# Patient Record
Sex: Female | Born: 1985
Health system: Southern US, Community
[De-identification: ages and names within clinical notes are randomized; demographics above are authoritative.]

## PROBLEM LIST (undated history)

## (undated) ENCOUNTER — Emergency Department (HOSPITAL_COMMUNITY): Payer: Medicaid Other

## (undated) DIAGNOSIS — F329 Major depressive disorder, single episode, unspecified: Secondary | ICD-10-CM

## (undated) DIAGNOSIS — E079 Disorder of thyroid, unspecified: Secondary | ICD-10-CM

## (undated) DIAGNOSIS — R0600 Dyspnea, unspecified: Secondary | ICD-10-CM

## (undated) DIAGNOSIS — G8929 Other chronic pain: Secondary | ICD-10-CM

## (undated) DIAGNOSIS — Z5189 Encounter for other specified aftercare: Secondary | ICD-10-CM

## (undated) DIAGNOSIS — D649 Anemia, unspecified: Secondary | ICD-10-CM

## (undated) DIAGNOSIS — G5601 Carpal tunnel syndrome, right upper limb: Secondary | ICD-10-CM

## (undated) DIAGNOSIS — R51 Headache: Secondary | ICD-10-CM

## (undated) DIAGNOSIS — J189 Pneumonia, unspecified organism: Secondary | ICD-10-CM

## (undated) DIAGNOSIS — E669 Obesity, unspecified: Secondary | ICD-10-CM

## (undated) DIAGNOSIS — M1990 Unspecified osteoarthritis, unspecified site: Secondary | ICD-10-CM

## (undated) DIAGNOSIS — M549 Dorsalgia, unspecified: Secondary | ICD-10-CM

## (undated) DIAGNOSIS — F32A Depression, unspecified: Secondary | ICD-10-CM

## (undated) DIAGNOSIS — F419 Anxiety disorder, unspecified: Secondary | ICD-10-CM

## (undated) DIAGNOSIS — R011 Cardiac murmur, unspecified: Secondary | ICD-10-CM

## (undated) DIAGNOSIS — IMO0001 Reserved for inherently not codable concepts without codable children: Secondary | ICD-10-CM

## (undated) HISTORY — PX: ENDOMETRIAL ABLATION: SHX621

## (undated) HISTORY — PX: BACK SURGERY: SHX140

## (undated) HISTORY — PX: THYROIDECTOMY: SHX17

## (undated) HISTORY — PX: HAND SURGERY: SHX662

## (undated) HISTORY — PX: LAPAROSCOPY: SHX197

## (undated) HISTORY — PX: TUBAL LIGATION: SHX77

## (undated) HISTORY — PX: CHOLECYSTECTOMY: SHX55

## (undated) HISTORY — PX: APPENDECTOMY: SHX54

## (undated) HISTORY — PX: TONSILLECTOMY: SUR1361

---
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
## 2004-01-20 ENCOUNTER — Emergency Department (HOSPITAL_COMMUNITY): Admission: EM | Admit: 2004-01-20 | Discharge: 2004-01-20 | Payer: Self-pay | Admitting: Emergency Medicine

## 2004-03-30 ENCOUNTER — Emergency Department (HOSPITAL_COMMUNITY): Admission: EM | Admit: 2004-03-30 | Discharge: 2004-03-30 | Payer: Self-pay | Admitting: Emergency Medicine

## 2004-04-05 ENCOUNTER — Ambulatory Visit: Payer: Self-pay | Admitting: Orthopedic Surgery

## 2004-04-12 ENCOUNTER — Encounter (HOSPITAL_COMMUNITY): Admission: RE | Admit: 2004-04-12 | Discharge: 2004-05-12 | Payer: Self-pay | Admitting: Orthopedic Surgery

## 2004-05-09 ENCOUNTER — Ambulatory Visit: Payer: Self-pay | Admitting: Family Medicine

## 2004-05-25 ENCOUNTER — Ambulatory Visit (HOSPITAL_COMMUNITY): Admission: RE | Admit: 2004-05-25 | Discharge: 2004-05-25 | Payer: Self-pay | Admitting: Family Medicine

## 2004-06-28 ENCOUNTER — Emergency Department (HOSPITAL_COMMUNITY): Admission: EM | Admit: 2004-06-28 | Discharge: 2004-06-28 | Payer: Self-pay | Admitting: Emergency Medicine

## 2004-12-15 ENCOUNTER — Emergency Department (HOSPITAL_COMMUNITY): Admission: EM | Admit: 2004-12-15 | Discharge: 2004-12-16 | Payer: Self-pay | Admitting: *Deleted

## 2005-01-24 ENCOUNTER — Emergency Department (HOSPITAL_COMMUNITY): Admission: EM | Admit: 2005-01-24 | Discharge: 2005-01-24 | Payer: Self-pay | Admitting: Emergency Medicine

## 2005-03-06 ENCOUNTER — Emergency Department (HOSPITAL_COMMUNITY): Admission: EM | Admit: 2005-03-06 | Discharge: 2005-03-06 | Payer: Self-pay | Admitting: Emergency Medicine

## 2005-03-31 ENCOUNTER — Emergency Department (HOSPITAL_COMMUNITY): Admission: EM | Admit: 2005-03-31 | Discharge: 2005-03-31 | Payer: Self-pay | Admitting: Emergency Medicine

## 2005-07-02 ENCOUNTER — Emergency Department (HOSPITAL_COMMUNITY): Admission: EM | Admit: 2005-07-02 | Discharge: 2005-07-02 | Payer: Self-pay | Admitting: Family Medicine

## 2005-10-13 ENCOUNTER — Emergency Department (HOSPITAL_COMMUNITY): Admission: EM | Admit: 2005-10-13 | Discharge: 2005-10-13 | Payer: Self-pay | Admitting: Emergency Medicine

## 2005-11-17 ENCOUNTER — Emergency Department (HOSPITAL_COMMUNITY): Admission: EM | Admit: 2005-11-17 | Discharge: 2005-11-17 | Payer: Self-pay | Admitting: Emergency Medicine

## 2005-11-18 ENCOUNTER — Emergency Department (HOSPITAL_COMMUNITY): Admission: EM | Admit: 2005-11-18 | Discharge: 2005-11-18 | Payer: Self-pay | Admitting: Emergency Medicine

## 2006-02-03 ENCOUNTER — Inpatient Hospital Stay (HOSPITAL_COMMUNITY): Admission: AD | Admit: 2006-02-03 | Discharge: 2006-02-03 | Payer: Self-pay | Admitting: Family Medicine

## 2006-02-10 ENCOUNTER — Emergency Department (HOSPITAL_COMMUNITY): Admission: EM | Admit: 2006-02-10 | Discharge: 2006-02-10 | Payer: Self-pay | Admitting: Emergency Medicine

## 2006-06-02 ENCOUNTER — Emergency Department (HOSPITAL_COMMUNITY): Admission: EM | Admit: 2006-06-02 | Discharge: 2006-06-02 | Payer: Self-pay | Admitting: Emergency Medicine

## 2006-07-15 ENCOUNTER — Emergency Department (HOSPITAL_COMMUNITY): Admission: EM | Admit: 2006-07-15 | Discharge: 2006-07-15 | Payer: Self-pay | Admitting: Family Medicine

## 2006-07-27 ENCOUNTER — Emergency Department (HOSPITAL_COMMUNITY): Admission: EM | Admit: 2006-07-27 | Discharge: 2006-07-27 | Payer: Self-pay | Admitting: Emergency Medicine

## 2006-08-02 ENCOUNTER — Emergency Department (HOSPITAL_COMMUNITY): Admission: EM | Admit: 2006-08-02 | Discharge: 2006-08-02 | Payer: Self-pay | Admitting: Emergency Medicine

## 2006-08-18 ENCOUNTER — Inpatient Hospital Stay (HOSPITAL_COMMUNITY): Admission: AD | Admit: 2006-08-18 | Discharge: 2006-08-18 | Payer: Self-pay | Admitting: Family Medicine

## 2006-09-02 ENCOUNTER — Ambulatory Visit (HOSPITAL_COMMUNITY): Admission: RE | Admit: 2006-09-02 | Discharge: 2006-09-02 | Payer: Self-pay | Admitting: Family Medicine

## 2006-09-08 ENCOUNTER — Inpatient Hospital Stay (HOSPITAL_COMMUNITY): Admission: AD | Admit: 2006-09-08 | Discharge: 2006-09-09 | Payer: Self-pay | Admitting: Obstetrics and Gynecology

## 2006-09-24 ENCOUNTER — Ambulatory Visit (HOSPITAL_COMMUNITY): Admission: RE | Admit: 2006-09-24 | Discharge: 2006-09-24 | Payer: Self-pay | Admitting: Family Medicine

## 2006-10-07 ENCOUNTER — Ambulatory Visit (HOSPITAL_COMMUNITY): Admission: RE | Admit: 2006-10-07 | Discharge: 2006-10-07 | Payer: Self-pay | Admitting: Family Medicine

## 2006-10-22 ENCOUNTER — Ambulatory Visit (HOSPITAL_COMMUNITY): Admission: RE | Admit: 2006-10-22 | Discharge: 2006-10-22 | Payer: Self-pay | Admitting: Obstetrics and Gynecology

## 2006-10-31 ENCOUNTER — Emergency Department (HOSPITAL_COMMUNITY): Admission: EM | Admit: 2006-10-31 | Discharge: 2006-10-31 | Payer: Self-pay | Admitting: Emergency Medicine

## 2006-11-30 ENCOUNTER — Inpatient Hospital Stay (HOSPITAL_COMMUNITY): Admission: AD | Admit: 2006-11-30 | Discharge: 2006-12-01 | Payer: Self-pay | Admitting: Obstetrics & Gynecology

## 2006-12-06 ENCOUNTER — Inpatient Hospital Stay (HOSPITAL_COMMUNITY): Admission: AD | Admit: 2006-12-06 | Discharge: 2006-12-06 | Payer: Self-pay | Admitting: Obstetrics & Gynecology

## 2007-01-12 ENCOUNTER — Ambulatory Visit: Payer: Self-pay | Admitting: *Deleted

## 2007-01-12 ENCOUNTER — Inpatient Hospital Stay (HOSPITAL_COMMUNITY): Admission: AD | Admit: 2007-01-12 | Discharge: 2007-01-12 | Payer: Self-pay | Admitting: Obstetrics & Gynecology

## 2007-01-17 ENCOUNTER — Ambulatory Visit (HOSPITAL_COMMUNITY): Admission: RE | Admit: 2007-01-17 | Discharge: 2007-01-17 | Payer: Self-pay | Admitting: Obstetrics & Gynecology

## 2007-01-27 ENCOUNTER — Inpatient Hospital Stay (HOSPITAL_COMMUNITY): Admission: AD | Admit: 2007-01-27 | Discharge: 2007-01-27 | Payer: Self-pay | Admitting: Family Medicine

## 2007-01-27 ENCOUNTER — Ambulatory Visit: Payer: Self-pay | Admitting: *Deleted

## 2007-02-07 ENCOUNTER — Ambulatory Visit: Payer: Self-pay | Admitting: Obstetrics and Gynecology

## 2007-02-07 ENCOUNTER — Inpatient Hospital Stay (HOSPITAL_COMMUNITY): Admission: AD | Admit: 2007-02-07 | Discharge: 2007-02-07 | Payer: Self-pay | Admitting: Obstetrics and Gynecology

## 2007-02-24 ENCOUNTER — Inpatient Hospital Stay (HOSPITAL_COMMUNITY): Admission: AD | Admit: 2007-02-24 | Discharge: 2007-02-25 | Payer: Self-pay | Admitting: Obstetrics & Gynecology

## 2007-02-24 ENCOUNTER — Ambulatory Visit: Payer: Self-pay | Admitting: Physician Assistant

## 2007-02-25 ENCOUNTER — Ambulatory Visit: Payer: Self-pay | Admitting: Obstetrics and Gynecology

## 2007-02-25 ENCOUNTER — Inpatient Hospital Stay (HOSPITAL_COMMUNITY): Admission: AD | Admit: 2007-02-25 | Discharge: 2007-02-26 | Payer: Self-pay | Admitting: Gynecology

## 2007-03-12 ENCOUNTER — Inpatient Hospital Stay (HOSPITAL_COMMUNITY): Admission: AD | Admit: 2007-03-12 | Discharge: 2007-03-12 | Payer: Self-pay | Admitting: Obstetrics & Gynecology

## 2007-03-17 ENCOUNTER — Inpatient Hospital Stay (HOSPITAL_COMMUNITY): Admission: AD | Admit: 2007-03-17 | Discharge: 2007-03-17 | Payer: Self-pay | Admitting: Obstetrics & Gynecology

## 2007-03-17 ENCOUNTER — Ambulatory Visit: Payer: Self-pay | Admitting: Obstetrics & Gynecology

## 2007-03-19 ENCOUNTER — Inpatient Hospital Stay (HOSPITAL_COMMUNITY): Admission: AD | Admit: 2007-03-19 | Discharge: 2007-03-23 | Payer: Self-pay | Admitting: Obstetrics & Gynecology

## 2007-03-19 ENCOUNTER — Ambulatory Visit: Payer: Self-pay | Admitting: Obstetrics & Gynecology

## 2007-04-06 ENCOUNTER — Ambulatory Visit: Payer: Self-pay | Admitting: Obstetrics and Gynecology

## 2007-04-06 ENCOUNTER — Inpatient Hospital Stay (HOSPITAL_COMMUNITY): Admission: AD | Admit: 2007-04-06 | Discharge: 2007-04-06 | Payer: Self-pay | Admitting: Obstetrics and Gynecology

## 2007-05-28 ENCOUNTER — Ambulatory Visit: Payer: Self-pay | Admitting: Gynecology

## 2007-05-30 ENCOUNTER — Encounter: Admission: RE | Admit: 2007-05-30 | Discharge: 2007-05-30 | Payer: Self-pay | Admitting: Gynecology

## 2007-06-11 ENCOUNTER — Ambulatory Visit: Payer: Self-pay | Admitting: Gynecology

## 2007-06-26 ENCOUNTER — Emergency Department (HOSPITAL_COMMUNITY): Admission: EM | Admit: 2007-06-26 | Discharge: 2007-06-26 | Payer: Self-pay | Admitting: Emergency Medicine

## 2007-07-13 ENCOUNTER — Emergency Department (HOSPITAL_COMMUNITY): Admission: EM | Admit: 2007-07-13 | Discharge: 2007-07-14 | Payer: Self-pay | Admitting: Emergency Medicine

## 2007-07-15 ENCOUNTER — Ambulatory Visit (HOSPITAL_COMMUNITY): Admission: RE | Admit: 2007-07-15 | Discharge: 2007-07-15 | Payer: Self-pay | Admitting: Emergency Medicine

## 2007-07-16 ENCOUNTER — Inpatient Hospital Stay (HOSPITAL_COMMUNITY): Admission: AD | Admit: 2007-07-16 | Discharge: 2007-07-16 | Payer: Self-pay | Admitting: Obstetrics & Gynecology

## 2007-07-30 ENCOUNTER — Ambulatory Visit: Payer: Self-pay | Admitting: Obstetrics & Gynecology

## 2007-08-19 ENCOUNTER — Ambulatory Visit: Payer: Self-pay | Admitting: Obstetrics & Gynecology

## 2007-08-19 ENCOUNTER — Ambulatory Visit (HOSPITAL_COMMUNITY): Admission: RE | Admit: 2007-08-19 | Discharge: 2007-08-19 | Payer: Self-pay | Admitting: Obstetrics & Gynecology

## 2007-09-03 DIAGNOSIS — G43909 Migraine, unspecified, not intractable, without status migrainosus: Secondary | ICD-10-CM | POA: Insufficient documentation

## 2007-09-03 DIAGNOSIS — E669 Obesity, unspecified: Secondary | ICD-10-CM | POA: Insufficient documentation

## 2007-09-06 ENCOUNTER — Emergency Department (HOSPITAL_COMMUNITY): Admission: EM | Admit: 2007-09-06 | Discharge: 2007-09-06 | Payer: Self-pay | Admitting: Emergency Medicine

## 2007-09-14 ENCOUNTER — Emergency Department (HOSPITAL_COMMUNITY): Admission: EM | Admit: 2007-09-14 | Discharge: 2007-09-14 | Payer: Self-pay | Admitting: Emergency Medicine

## 2007-10-01 ENCOUNTER — Ambulatory Visit: Payer: Self-pay | Admitting: Obstetrics & Gynecology

## 2007-11-11 ENCOUNTER — Emergency Department (HOSPITAL_COMMUNITY): Admission: EM | Admit: 2007-11-11 | Discharge: 2007-11-11 | Payer: Self-pay | Admitting: Emergency Medicine

## 2007-12-26 ENCOUNTER — Emergency Department (HOSPITAL_COMMUNITY): Admission: EM | Admit: 2007-12-26 | Discharge: 2007-12-26 | Payer: Self-pay | Admitting: Family Medicine

## 2008-01-18 ENCOUNTER — Emergency Department (HOSPITAL_COMMUNITY): Admission: EM | Admit: 2008-01-18 | Discharge: 2008-01-18 | Payer: Self-pay | Admitting: Emergency Medicine

## 2008-03-23 ENCOUNTER — Emergency Department (HOSPITAL_COMMUNITY): Admission: EM | Admit: 2008-03-23 | Discharge: 2008-03-23 | Payer: Self-pay | Admitting: Emergency Medicine

## 2008-04-22 ENCOUNTER — Emergency Department (HOSPITAL_COMMUNITY): Admission: EM | Admit: 2008-04-22 | Discharge: 2008-04-22 | Payer: Self-pay | Admitting: Emergency Medicine

## 2008-05-02 ENCOUNTER — Emergency Department (HOSPITAL_COMMUNITY): Admission: EM | Admit: 2008-05-02 | Discharge: 2008-05-02 | Payer: Self-pay | Admitting: Emergency Medicine

## 2008-06-03 ENCOUNTER — Emergency Department (HOSPITAL_COMMUNITY): Admission: EM | Admit: 2008-06-03 | Discharge: 2008-06-03 | Payer: Self-pay | Admitting: Emergency Medicine

## 2008-07-29 ENCOUNTER — Emergency Department (HOSPITAL_COMMUNITY): Admission: EM | Admit: 2008-07-29 | Discharge: 2008-07-29 | Payer: Self-pay | Admitting: Emergency Medicine

## 2008-08-09 ENCOUNTER — Encounter: Payer: Self-pay | Admitting: Family Medicine

## 2008-08-09 ENCOUNTER — Ambulatory Visit: Payer: Self-pay | Admitting: Obstetrics & Gynecology

## 2008-08-09 LAB — CONVERTED CEMR LAB: GC Probe Amp, Genital: NEGATIVE

## 2008-08-17 ENCOUNTER — Ambulatory Visit (HOSPITAL_COMMUNITY): Admission: RE | Admit: 2008-08-17 | Discharge: 2008-08-17 | Payer: Self-pay | Admitting: Family Medicine

## 2008-08-26 ENCOUNTER — Emergency Department (HOSPITAL_COMMUNITY): Admission: EM | Admit: 2008-08-26 | Discharge: 2008-08-26 | Payer: Self-pay | Admitting: Emergency Medicine

## 2008-09-11 ENCOUNTER — Emergency Department (HOSPITAL_COMMUNITY): Admission: EM | Admit: 2008-09-11 | Discharge: 2008-09-11 | Payer: Self-pay | Admitting: Emergency Medicine

## 2008-11-25 ENCOUNTER — Ambulatory Visit: Payer: Self-pay | Admitting: Obstetrics & Gynecology

## 2008-11-25 ENCOUNTER — Encounter: Payer: Self-pay | Admitting: Obstetrics & Gynecology

## 2008-11-25 ENCOUNTER — Other Ambulatory Visit: Admission: RE | Admit: 2008-11-25 | Discharge: 2008-11-25 | Payer: Self-pay | Admitting: Obstetrics & Gynecology

## 2009-01-31 ENCOUNTER — Encounter: Payer: Self-pay | Admitting: Family Medicine

## 2009-01-31 ENCOUNTER — Ambulatory Visit: Payer: Self-pay | Admitting: Obstetrics and Gynecology

## 2009-01-31 LAB — CONVERTED CEMR LAB: hCG, Beta Chain, Quant, S: <2 milliintl units/mL

## 2009-02-04 ENCOUNTER — Emergency Department (HOSPITAL_COMMUNITY): Admission: EM | Admit: 2009-02-04 | Discharge: 2009-02-04 | Payer: Self-pay | Admitting: Emergency Medicine

## 2009-02-16 ENCOUNTER — Ambulatory Visit: Payer: Self-pay | Admitting: Obstetrics & Gynecology

## 2009-02-21 ENCOUNTER — Ambulatory Visit (HOSPITAL_COMMUNITY): Admission: RE | Admit: 2009-02-21 | Discharge: 2009-02-21 | Payer: Self-pay | Admitting: Obstetrics & Gynecology

## 2009-03-02 ENCOUNTER — Encounter: Payer: Self-pay | Admitting: Family Medicine

## 2009-06-07 ENCOUNTER — Ambulatory Visit (HOSPITAL_COMMUNITY): Admission: RE | Admit: 2009-06-07 | Discharge: 2009-06-07 | Payer: Self-pay | Admitting: Obstetrics and Gynecology

## 2009-06-22 ENCOUNTER — Encounter: Admission: RE | Admit: 2009-06-22 | Discharge: 2009-06-22 | Payer: Self-pay | Admitting: Obstetrics and Gynecology

## 2009-07-18 ENCOUNTER — Ambulatory Visit (HOSPITAL_COMMUNITY): Admission: RE | Admit: 2009-07-18 | Discharge: 2009-07-18 | Payer: Self-pay | Admitting: Obstetrics and Gynecology

## 2009-09-03 ENCOUNTER — Emergency Department (HOSPITAL_COMMUNITY): Admission: EM | Admit: 2009-09-03 | Discharge: 2009-09-03 | Payer: Self-pay | Admitting: Emergency Medicine

## 2009-10-16 ENCOUNTER — Emergency Department (HOSPITAL_COMMUNITY): Admission: EM | Admit: 2009-10-16 | Discharge: 2009-10-16 | Payer: Self-pay | Admitting: Internal Medicine

## 2009-11-20 ENCOUNTER — Emergency Department (HOSPITAL_COMMUNITY): Admission: EM | Admit: 2009-11-20 | Discharge: 2009-11-20 | Payer: Self-pay | Admitting: Emergency Medicine

## 2009-11-20 ENCOUNTER — Inpatient Hospital Stay (HOSPITAL_COMMUNITY): Admission: AD | Admit: 2009-11-20 | Discharge: 2009-11-20 | Payer: Self-pay | Admitting: Obstetrics and Gynecology

## 2010-05-30 ENCOUNTER — Emergency Department (HOSPITAL_COMMUNITY)
Admission: EM | Admit: 2010-05-30 | Discharge: 2010-05-30 | Discharge status: Against Medical Advice | Payer: Medicaid Other | Attending: Emergency Medicine | Admitting: Emergency Medicine

## 2010-06-16 LAB — URINALYSIS, ROUTINE W REFLEX MICROSCOPIC
Glucose, UA: NEGATIVE mg/dL
Protein, ur: NEGATIVE mg/dL
pH: 8 (ref 5.0–8.0)

## 2010-06-16 LAB — URINE CULTURE: Culture  Setup Time: 201108220023

## 2010-06-16 LAB — CBC
Hemoglobin: 11.4 g/dL — ABNORMAL LOW (ref 12.0–15.0)
MCHC: 33 g/dL (ref 30.0–36.0)
RBC: 4.14 MIL/uL (ref 3.87–5.11)

## 2010-06-16 LAB — WET PREP, GENITAL: Yeast Wet Prep HPF POC: NONE SEEN

## 2010-06-16 LAB — URINE MICROSCOPIC-ADD ON

## 2010-06-20 LAB — CBC
HCT: 32.9 % — ABNORMAL LOW (ref 36.0–46.0)
Hemoglobin: 10.7 g/dL — ABNORMAL LOW (ref 12.0–15.0)
MCHC: 32.7 g/dL (ref 30.0–36.0)
MCV: 81.6 fL (ref 78.0–100.0)
RBC: 4.03 MIL/uL (ref 3.87–5.11)

## 2010-06-20 LAB — HCG, SERUM, QUALITATIVE: Preg, Serum: NEGATIVE

## 2010-06-25 LAB — CBC
HCT: 32.5 % — ABNORMAL LOW (ref 36.0–46.0)
MCHC: 32.8 g/dL (ref 30.0–36.0)
MCV: 82.4 fL (ref 78.0–100.0)
Platelets: 377 10*3/uL (ref 150–400)
RDW: 15.4 % (ref 11.5–15.5)

## 2010-07-10 LAB — POCT URINALYSIS DIP (DEVICE)
Nitrite: NEGATIVE
Protein, ur: NEGATIVE mg/dL
Urobilinogen, UA: 0.2 mg/dL (ref 0.0–1.0)
pH: 6.5 (ref 5.0–8.0)

## 2010-07-10 LAB — WET PREP, GENITAL
Clue Cells Wet Prep HPF POC: NONE SEEN
WBC, Wet Prep HPF POC: NONE SEEN

## 2010-07-11 LAB — URINE MICROSCOPIC-ADD ON

## 2010-07-11 LAB — URINALYSIS, ROUTINE W REFLEX MICROSCOPIC
Ketones, ur: 15 mg/dL — AB
Nitrite: NEGATIVE
Specific Gravity, Urine: 1.025 (ref 1.005–1.030)
Urobilinogen, UA: 1 mg/dL (ref 0.0–1.0)
pH: 6 (ref 5.0–8.0)

## 2010-07-11 LAB — PREGNANCY, URINE: Preg Test, Ur: NEGATIVE

## 2010-07-12 LAB — GC/CHLAMYDIA PROBE AMP, GENITAL: Chlamydia, DNA Probe: POSITIVE — AB

## 2010-07-12 LAB — URINE MICROSCOPIC-ADD ON

## 2010-07-12 LAB — WET PREP, GENITAL
Trich, Wet Prep: NONE SEEN
Yeast Wet Prep HPF POC: NONE SEEN

## 2010-07-12 LAB — URINALYSIS, ROUTINE W REFLEX MICROSCOPIC
Bilirubin Urine: NEGATIVE
Nitrite: NEGATIVE
Specific Gravity, Urine: 1.014 (ref 1.005–1.030)
pH: 7 (ref 5.0–8.0)

## 2010-07-17 LAB — COMPREHENSIVE METABOLIC PANEL
ALT: 26 U/L (ref 0–35)
AST: 29 U/L (ref 0–37)
Albumin: 3.6 g/dL (ref 3.5–5.2)
CO2: 27 mEq/L (ref 19–32)
Chloride: 105 mEq/L (ref 96–112)
Creatinine, Ser: 0.83 mg/dL (ref 0.4–1.2)
GFR calc Af Amer: >60 mL/min (ref >60)
GFR calc non Af Amer: >60 mL/min (ref >60)
Potassium: 3.8 mEq/L (ref 3.5–5.1)
Sodium: 137 mEq/L (ref 135–145)
Total Bilirubin: 0.7 mg/dL (ref 0.3–1.2)

## 2010-07-17 LAB — URINALYSIS, ROUTINE W REFLEX MICROSCOPIC
Bilirubin Urine: NEGATIVE
Glucose, UA: NEGATIVE mg/dL
Ketones, ur: NEGATIVE mg/dL
Protein, ur: NEGATIVE mg/dL
pH: 6 (ref 5.0–8.0)

## 2010-07-17 LAB — URINE MICROSCOPIC-ADD ON

## 2010-07-17 LAB — DIFFERENTIAL
Basophils Absolute: 0 10*3/uL (ref 0.0–0.1)
Eosinophils Absolute: 0.1 10*3/uL (ref 0.0–0.7)
Eosinophils Relative: 2 % (ref 0–5)
Lymphocytes Relative: 19 % (ref 12–46)
Monocytes Absolute: 0.5 10*3/uL (ref 0.1–1.0)

## 2010-07-17 LAB — CBC
Platelets: 309 10*3/uL (ref 150–400)
RBC: 4.56 MIL/uL (ref 3.87–5.11)
WBC: 5.5 10*3/uL (ref 4.0–10.5)

## 2010-07-17 LAB — LIPASE, BLOOD: Lipase: 29 U/L (ref 11–59)

## 2010-07-17 LAB — POCT PREGNANCY, URINE: Preg Test, Ur: NEGATIVE

## 2010-08-04 ENCOUNTER — Other Ambulatory Visit: Payer: Self-pay | Admitting: Orthopedic Surgery

## 2010-08-04 DIAGNOSIS — M545 Low back pain: Secondary | ICD-10-CM

## 2010-08-07 ENCOUNTER — Other Ambulatory Visit: Payer: Medicaid Other

## 2010-08-12 ENCOUNTER — Emergency Department (HOSPITAL_COMMUNITY)
Admission: EM | Admit: 2010-08-12 | Discharge: 2010-08-12 | Disposition: A | Discharge status: Home / Self Care | Payer: Medicaid Other | Attending: Emergency Medicine | Admitting: Emergency Medicine

## 2010-08-12 DIAGNOSIS — R609 Edema, unspecified: Secondary | ICD-10-CM | POA: Insufficient documentation

## 2010-08-12 DIAGNOSIS — M79609 Pain in unspecified limb: Secondary | ICD-10-CM | POA: Insufficient documentation

## 2010-08-15 NOTE — Group Therapy Note (Signed)
[name], [name] NO.:  [id number]   MEDICAL RECORD NO.:  [id number]          PATIENT TYPE:  WOC   LOCATION:  [facility]                   FACILITY:  [facility]   PHYSICIAN:  Sid Falcon, CNM  DATE OF BIRTH:  1985-06-28   DATE OF SERVICE:                                  CLINIC NOTE   NARRATIVE:  The patient is seen at [facility] on October 01, 2007 status post a bilateral tubal sterilization on Aug 19, 2007  performed by Dr. Marice Potter.  On review of the procedure, the tubal operation  had no complications.  The patient reported experiencing mild abdominal  discomfort rating her pain at a 2/10 occasionally.  Her last normal  menstrual period was on September 17, 2007.  The patient also had gallbladder  surgery on June 2nd 2009 and is reporting some nausea from that.   IMPRESSION:  Status post bilateral tubal ligation.   PLAN:  Follow up with primary care Kalib Bhagat regarding gallstones make  sure to get a Pap smear in November 2009, a year after the last one.  Tongue, uvula and palate for any additional concerns.      Sid Falcon, CNM     WM/MEDQ  D:  10/01/2007  T:  10/01/2007  Job:  [id number]

## 2010-08-15 NOTE — Assessment & Plan Note (Signed)
NAMENIMRIT, KEHRES             ACCOUNT NO.:  [id number]   MEDICAL RECORD NO.:  [id number]          PATIENT TYPE:  POB   LOCATION:  [facility] at [facility]         FACILITY:  [facility]   PHYSICIAN:  Allie Bossier, MD        DATE OF BIRTH:  1986/03/23   DATE OF SERVICE:  08/09/2008                                  CLINIC NOTE   HISTORY OF PRESENT ILLNESS:  Yvonne [name] is a 25 year old married black  gravida 2, para 1, abortus 1 who comes in today complaining of irregular  periods for the last few months.  She says that in May she had  essentially 2 periods, each lasted a week, 3 weeks apart.  Prior to  that, her periods were monthly.  Over the last 6-8 weeks, she has also  been experiencing dyspareunia and describes her periods as chunks  coming out.  She denies any new sexual partners.   PAST MEDICAL HISTORY:  Obesity.  She has a slipped disk, history of  PIH with her pregnancy.  She is a smoker, dysfunctional uterine  bleeding, and dyspareunia.   PAST SURGICAL HISTORY:  Cesarean section and tubal ligation.   ALLERGIES:  Reglan and aspirin.  No known latex allergies.   FAMILY HISTORY:  Positive for diabetes and hypertension, but negative  for breast, GYN, and colon malignancies.   PHYSICAL EXAMINATION:  VITAL SIGNS:  Height 5 feet, weight 197 pounds,  blood pressure 116/75, pulse 73.  ABDOMEN:  Benign.  EXTERNAL GENITALIA:  No lesions.  Cervix nulliparous.  Normal discharge,  appears to be ovulatory discharge today.  Bimanual exam reveals normal  size and shape, anteverted uterus, possibly slightly tilted to her left.  There is no adnexal masses and no particular tenderness with deep  palpation on the bimanual exam.   ASSESSMENT AND PLAN:  New-onset dyspareunia and dysfunctional uterine  bleeding.  I will check cervical cultures, a TSH, and I will order a GYN  ultrasound.  She will follow up after these results are available.      Allie Bossier, MD     MCD/MEDQ  D:  08/09/2008   T:  08/10/2008  Job:  [id number]

## 2010-08-15 NOTE — Assessment & Plan Note (Signed)
[name], [name]             ACCOUNT NO.:  [id number]   MEDICAL RECORD NO.:  [id number]          PATIENT TYPE:  POB   LOCATION:  [facility]         FACILITY:  [facility]   PHYSICIAN:  Allie Bossier, MD        DATE OF BIRTH:  [birth year]   DATE OF SERVICE:  02/16/2009                                  CLINIC NOTE   [name] is a 25 year old single black gravida 2, para 1, abortus 1,  who comes in here complaining of a 74-month history of pain at her C-  section incision site with associated nausea.  Please note that her C-  section was done almost 2 years ago.  She says that her bowels have not  changed.  She has approximately 4 bowel movements a day.  She called  here and spoke with Inetta Fermo regarding her pain and Inetta Fermo recommended that  she go to her family practice doctor.  However, she showed up at the  [facility] Room on February 07, 2009, and they gave her  famotidine.  She says that she takes it occasionally.   On physical exam, her abdominal exam is benign.  Her external genitalia  is normal.  Her uterus is anteverted, mobile, normal size and shape.  Her adnexa are normal.  I did not elicit her tenderness.  She says that  feels like a pulling sensation.  I have recommended that we get an  ultrasound.  However, I have also counseled her that I suspect that this  will be a self-resolving issue and that she can take Tylenol and  ibuprofen along with her famotidine as prescribed.      Allie Bossier, MD     MCD/MEDQ  D:  02/16/2009  T:  02/17/2009  Job:  [id number]

## 2010-08-15 NOTE — Op Note (Signed)
Yvonne [name], Yvonne [name]             ACCOUNT NO.:  [id number]   MEDICAL RECORD NO.:  [id number]          PATIENT TYPE:  INP   LOCATION:  9113                          [facility]ACILITY[facility]  [facility]   PHYSICIAN:  Allie Bossier, MD        DATE OF BIRTH:  09-11-1985   DATE OF PROCEDURE:  03/20/2007  DATE OF DISCHARGE:                               OPERATIVE REPORT   PREOPERATIVE DIAGNOSES:  1. Intrauterine pregnancy at 39 weeks 1 day.  2. Cephalopelvic disproportion.  3. Arrest of dilatation.  4. Large for gestational age per ultrasound.  5. Morbid obesity.  6. Group B streptococcus positive.   POSTOPERATIVE DIAGNOSES:  1. Intrauterine pregnancy at 39 weeks 1 day.  2. Cephalopelvic disproportion.  3. Arrest of dilatation.  4. Large for gestational age per ultrasound.  5. Morbid obesity.  6. Group B streptococcus positive.   PROCEDURE:  Primary low transverse cesarean section.   SURGEON:  Allie Bossier, M.D.   ASSISTANT:  Karlton Lemon, M.D.   ANESTHESIA:  Epidural.   FINDINGS:  1. Viable infant female, weighing 7 pounds 8 ounces with Apgars of 7      at one minute, 9 at five minutes, in vertex presentation.  2. Clear amniotic fluid.  3. Normal female pelvic anatomy.   ESTIMATED BLOOD LOSS:  900 mL.   DRAINS:  Foley with clear yellow urine.   COMPLICATIONS:  None immediate.   SPECIMENS:  Placenta to labor and delivery.   INDICATIONS FOR PROCEDURE:  This is a 25 year old gravida 2, para 0, at  39 weeks 1 day, that presented for induction of labor for large for  gestational age fetus per ultrasound, performed on December 15.  Estimated fetal weight by ultrasound was 3963 g.  She underwent  induction with Foley bulb, followed by Pitocin.  She stayed at 5-6 cm  for four hours and the infant's head was -3 station with a large amount  of caput.  At this time, it was felt her pelvis was not adequate for  delivery of the infant and she was taken for primary low transverse  cesarean section.   DESCRIPTION OF PROCEDURE:  Patient was taken to the operating room and  was prepped and draped in the usual sterile manner in the supine  position with left lateral uterine displacement.  After insuring  adequate epidural anesthesia, a Pfannenstiel skin incision was made,  using the scalpel.  The incision was carried down through the  subcutaneous tissues, using the scalpel.  The rectus fascia was nicked  in the midline and the incision was extended laterally in each  direction, using the Mayo scissors.  The rectus muscle was dissected  free of the fascia, using both sharp and blunt dissection.  The rectus  muscles were separated bluntly.  The parietal peritoneum was identified,  grasped between two hemostats, elevated and entered inferiorly under  direct visualization.  The peritoneum was further opened with Bovie  cautery.  The bladder blade was then placed and a low transverse uterine  incision was made, using the scalpel.  The incision was then extended  laterally and superiorly, using bandage scissors and blunt dissection.  A hand was placed within the uterine cavity, used to elevate and flex  the head of the infant, which was delivered without difficulty.  Infant  was bulb suctioned after delivery of the head.  Body of the infant was  then delivered without difficulty.  The cord was doubly clamped and cut  and the infant handed to the nursery team in attendance.  Placenta was  then delivered manually and appeared intact.   The uterus was elevated onto the anterior abdominal wall and wrapped in  a wet laparotomy sponge.  The endometrial cavity was wiped free of any  traces of membranes, using wet laparotomy sponges.  An area of bleeding  on the inferior portion of the uterine incision was grasped with a ring  clamp to control bleeding and the uterine incision was closed in two  layers, the first being a running locked stitch of 0 chromic and the  second being a running imbricating  locking stitch of 0 chromic.  A small  area of bleeding was controlled with one figure-of-eight suture of 0  chromic along the uterine incision.  The uterus was then placed back  within the abdomen and inspected.  Once again, the uterus was found to  have adequate hemostasis.   At this time, attention was turned to the subcutaneous tissue, where  there was a fair amount of bleeding.  The areas were controlled using  Bovie cautery.  The rectus fascia was then reapproximated with two  sutures of 0 Vicryl, beginning laterally and meeting in the midline.  Again, the subcutaneous tissues were inspected and small areas of  bleeding were controlled using the Bovie cautery.  The skin was  reapproximated with one subcuticular layer of 3-0 Vicryl.  Sponge,  needle and instrument counts were correct times two.  The patient  tolerated the procedure and went to the postanesthesia care room in  stable condition.      Karlton Lemon, MD  Electronically Signed     ______________________________  Allie Bossier, MD    NS/MEDQ  D:  03/20/2007  T:  03/21/2007  Job:  [id number]

## 2010-08-15 NOTE — Consult Note (Signed)
NAMETYREE, VANDRUFF NO.:  [id number]   MEDICAL RECORD NO.:  [id number]          PATIENT TYPE:  WOC   LOCATION:  [facility]                          FACILITY:  [facility]   PHYSICIAN:  Ginger Carne, MD  DATE OF BIRTH:  10-Aug-1985   DATE OF CONSULTATION:  DATE OF DISCHARGE:                                 CONSULTATION   HISTORY OF PRESENT ILLNESS:  Yvonne [name] is a 25 year old gravida 2,  para 1 with an SAB and 1 term birth via C-section on March 20, 2007.  The patient is here requesting a bilateral tubal ligation.  Upon  assessment, the patient reports that her first day of her last period  was May 04, 2007.  Currently, the patient is taking aspirin and  Celexa, which was prescribed to her by Dr. Marice Potter upon discharge from the  hospital for depression.  The patient's last Pap smear was on May 04, 2007 as well with no history of abnormal Pap smear.   MEDICAL HISTORY:  There is a history of:  1. Arthritis/lumbar arthritis diagnosed in 2006.  2. Pregnancy-induced hypertension with last baby.   SOCIAL HISTORY:  The patient does smoke 1 pack of cigarettes per day for  the last 5 years.   REVIEW OF SYSTEMS:  The patient reports bruising for 1 month only on the  breasts, muscle aches along the spine related to the arthritis, fatigue  reported due to decreased sleep with new baby, migraines diagnosed in  2000, not currently taking medication for that, dizziness that occurs  with the migraines as well as vision change and nausea and vomiting.  Also reports chest pain which increases with coughing, no radiation and  no diaphoresis.  Breasts have an itching that occurs.  No abnormal  discharge reported by patient.  Also, there is itching in the C-section  scar, no abnormal discharge reported.   PHYSICAL EXAMINATION:  LUNGS:  Upon assessment, the patient's lungs are  clear throughout to auscultation.  HEART:  Regular rate and rhythm.  NECK:  No thyromegaly.  BREASTS:  Eczema-type lesions seen around the nipples.  No masses  palpated bilaterally and no tenderness.  ABDOMEN:  Nontender.  No hepatosplenomegaly.  EXTREMITIES:  Negative Homans sign.  DTRs 2+ bilaterally.  PELVIC:  Exam deferred due to it being completed on May 04, 2007 at  the [facility].   ASSESSMENT:  1. Migraine history.  2. Bilateral tubal consult.  3. Depression.  4. Nipple lesions bilaterally.   PLAN:  The patient is to be referred to [facility] for a consult  regarding the desire for a bilateral tubal ligation.  Methods besides  tubal ligation were discussed with the patient including the Merina and  its effectiveness being greater than that of a tubal and it is also the  possibility of being reversible.  I explained to the patient that a  bilateral tubal ligation is a permanent procedure and that reversal is  minimal.  Also, we will obtain a urine pregnancy  test to rule out pregnancy.  Lotrimin cream for the incision site, keep  dry.  For the breast lesions, we are going to apply hydrocortisone 1%  cream and Eucerin for 1 week, as well as refer for a diagnostic  mammogram and consult with the breast center.  The patient will also  follow up in 2 weeks to review the results.      ZOXWRUE [name], CNM      Ginger Carne, MD  Electronically Signed    WM/MEDQ  D:  05/28/2007  T:  05/29/2007  Job:  [id number]

## 2010-08-15 NOTE — Op Note (Signed)
Yvonne [name], Yvonne [name]             ACCOUNT NO.:  [id number]   MEDICAL RECORD NO.:  [id number]          PATIENT TYPE:  AMB   LOCATION:  [facility]                           [facility]   PHYSICIAN:  Allie Bossier, MD        DATE OF BIRTH:  1985-10-13   DATE OF PROCEDURE:  DATE OF DISCHARGE:                               OPERATIVE REPORT   PREOPERATIVE DIAGNOSIS:  Multiparity, desires sterility.   POSTOPERATIVE DIAGNOSIS:  Multiparity, desires sterility.   PROCEDURE:  Bilateral tubal ligation with application of Filshie clip.   SURGEON:  Allie Bossier, MD   ANESTHESIA:  GETA.   ESTIMATED BLOOD LOSS:  Minimal.   SPECIMENS:  None.   FINDINGS:  Normal-appearing adnexa.   PROCEDURE AND FINDINGS:  The risks, benefits, and alternatives of the  surgery, explained, understood and accepted, consents were signed.  She  was able to verbalize on 1% failure rate.  She declines alternative  forms of birth control.  She is certain she does not want more children.  She was taken to the operating room, general anesthesia was sought  without complication.  She was placed in dorsal lithotomy position.  Her  abdomen and vagina were prepped and draped in the usual sterile fashion.  Her bladder was entered with a Robinson catheter.  A Hulka manipulator  was placed.  The skulls were changed.  Attention was turned to the  abdomen.  A vertical umbilical incision was made.  The Veress needle was  placed intraperitoneally.  Low-flow CO2 was used to insufflate the  abdomen approximately 2-1/2 liters.  The patient's abdominal pressure  was always less than 10.  A 12-mm trocar was placed.  A laparoscope  confirmed correct placement.  The oviducts were visualized to their  fimbriated end in the isthmic region of each oviduct.  A Filshie clip  was placed across the entire oviduct.  No bleeding was noted in either  site.  The trocar was removed after allowing the CO2 to escape.  The  subcutaneous tissue was  infiltrated with 10 mL of 0.5% Marcaine.  A  subcuticular closure was done with 4-0 Vicryl suture.  Instruments,  sponge, needle and counts were correct.  She tolerated the procedure  well.  She was extubated and taken to recovery room in stable condition  after removal of the Hulka clamp from her cervix.      Allie Bossier, MD  Electronically Signed     MCD/MEDQ  D:  08/19/2007  T:  08/20/2007  Job:  [id number]

## 2010-08-15 NOTE — Discharge Summary (Signed)
[name], GRAVELLE NO.:  [id number]   MEDICAL RECORD NO.:  [id number]          PATIENT TYPE:  WOC   LOCATION:  [facility]                          FACILITY:  [facility]   PHYSICIAN:  Ginger Carne, MD  DATE OF BIRTH:  12-Mar-1986   DATE OF ADMISSION:  05/28/2007  DATE OF DISCHARGE:                               DISCHARGE SUMMARY   ADDENDUM:  For dictation on May 28, 2007.   The patient is currently on birth control, Micronor, taking daily.  Also  the patient's signs and symptoms were consulted with both Dr. Silas Flood and  Dr. Okey Dupre.      EAVWUJW [name], CNM      Ginger Carne, MD  Electronically Signed    WM/MEDQ  D:  05/28/2007  T:  05/29/2007  Job:  [id number]

## 2010-08-15 NOTE — Assessment & Plan Note (Signed)
[name], [name]             ACCOUNT NO.:  [id number]   MEDICAL RECORD NO.:  [id number]          PATIENT TYPE:  POB   LOCATION:  [facility]         [facility]ACILITY[facility]   PHYSICIAN:  Catalina Antigua, MD     DATE OF BIRTH:  [birth year]   DATE OF SERVICE:  11/25/2008                                  CLINIC NOTE   HISTORY OF PRESENT ILLNESS:  Yvonne [name] is a 25 year old gravida 2, para  1-0-1-1 who presented for a annual exam.  She is currently without any  complaints.  She was last seen on Aug 09, 2008 with a compliant of  dyspareunia and experiencing irregular periods.  A workup for which was  negative with a normal-appearing ultrasound and a normal TSH level.  The  patient now reports that her periods are now regular with her last  period occurring on November 15, 2008 and the dyspareunia has resolved.  The patient has had bilateral tubal ligations as a method of birth  control.   PAST MEDICAL HISTORY:  1. Obesity.  2. History of slip disc.  3. PIH with her last pregnancy.   She is a smoker.   PAST SURGICAL HISTORY:  She has had C-section and tubal ligation.   ALLERGIES:  Reglan and aspirin.   FAMILY HISTORY:  Positive for diabetes and hypertension.  Otherwise,  negative for any GYN malignancy as well as breast and colon cancer.   PHYSICAL EXAMINATION:  VITAL SIGNS:  Blood pressure of 108/73, pulse of  96, a weight of 202 pounds, and height of 5 feet.  BREAST:  Her breast exam were symmetric in size.  No palpable masses.  No dimpling.  ABDOMEN:  Soft, nontender, and nondistended.  EXTERNAL GENITALIA:  No lesions.  Normal-appearing cervix.  Normal-  appearing discharge.   BIMANUAL EXAM:  A normal size anteverted uterus.  No palpable adnexal  masses.  No tenderness.   ASSESSMENT AND PLAN:  Pap smear and cultures were performed.  The  patient was advised to quit her smoking habits.  The patient is  instructed to continue the use of condoms to prevent the spread of  sexually transmitted diseases.  The patient to follow up in 1 year or as  needed.           ______________________________  Catalina Antigua, MD     PC/MEDQ  D:  11/25/2008  T:  11/25/2008  Job:  [id number]

## 2010-08-15 NOTE — Group Therapy Note (Signed)
[name], [name] NO.:  [id number]   MEDICAL RECORD NO.:  [id number]          PATIENT TYPE:  WOC   LOCATION:  [facility]                   FACILITY:  [facility]   PHYSICIAN:  Allie Bossier, MD        DATE OF BIRTH:  10/24/1985   DATE OF SERVICE:  07/30/2007                                  CLINIC NOTE   Yvonne [name] is a 25 year old, single, gravida 2, para 1, abortus 1, with a  43-month-old daughter, named Yvonne [name].  She had an unplanned C-section two  months ago for the delivery of her daughter, due to cephalopelvic  disproportion.  She had some postpartum hemorrhage and required  transfusion of two units of blood.  She wished to have permanent  sterility in the form of a tubal sterilization.  She understands the  failure rate.  She declines alternative forms of birth control.  She has  definitely been counseled by more than one M.D. and on more than one  occasion of the permanence of this procedure.  She is adamant that she  does not want more children.   PAST MEDICAL HISTORY:  She is overweight.  She has migraines, lower back  pains.   REVIEW OF SYSTEMS:  She works at [employer].  She has a friend that she  has intercourse with for the last three months.  She was discharged home  from her delivery with Celexa and she took it for about a month.  She  weaned herself off about a month ago and says she feels better.   FAMILY HISTORY:  Negative for breast, GYN and colon malignancies.   PAST SURGICAL HISTORY:  C-section.  D and C after a miscarriage in 2007.  She had an appendectomy at age 21.  She had what sounds to be a  laparoscopy at [facility] when she was 25 years old.  She had  tonsillectomy and she had ear drum surgery in the past.   MEDICATIONS:  She is on the Ortho-Evra and ibuprofen as necessary.   ALLERGIES:  METOCLOPRAMIDE.   PHYSICAL EXAM:  Blood pressure 101/59, height 5 feet, weight 205 pounds,  pulse 73.  HEENT:  Normal.  HEART:  Regular rate and  rhythm.  LUNGS:  Clear to auscultation bilaterally.  ABDOMEN:  Benign.  Scar healed.  EXTERNAL GENITALIA:  Normal.   ASSESSMENT AND PLAN:  This 25 year old patient desires permanent  sterilization.  She has been counseled at length about its risks, as  well as its permanence.  She wishes to proceed.      Allie Bossier, MD     MCD/MEDQ  D:  07/30/2007  T:  07/30/2007  Job:  [id number]

## 2010-08-15 NOTE — Group Therapy Note (Signed)
[name], Yvonne [name] NO.:  [id number]   MEDICAL RECORD NO.:  [id number]          PATIENT TYPE:  WOC   LOCATION[facility]  [facility]                   FACILITY:  [facility]   PHYSICIAN:  Johnella Moloney, MD        DATE OF BIRTH:  14-Mar-1986   DATE OF SERVICE:  10/01/2007                                  CLINIC NOTE   PHYSICAL EXAMINATION:  VITAL SIGNS:  Temperature 98.6, pulse 69, blood  pressure 101/64.  ABDOMEN:  Incision site shows no signs of infection.  Heals  appropriately.  Nontender with palpation.  No masses palpated.      Sid Falcon, CNM    ______________________________  Johnella Moloney, MD    WM/MEDQ  D:  10/01/2007  T:  10/01/2007  Job:  [id number]

## 2010-08-18 NOTE — Discharge Summary (Signed)
Yvonne [name], Yvonne [name]             ACCOUNT NO.:  [id number]   MEDICAL RECORD NO.:  [id number]          PATIENT TYPE:  INP   LOCATION:  9113                          FACILITY:  [facility]   PHYSICIAN:  Lesly Dukes, M.D. DATE OF BIRTH:  [birth year]   DATE OF ADMISSION:  03/19/2007  DATE OF DISCHARGE:  03/23/2007                               DISCHARGE SUMMARY   On admission, patient is a 25 year old gravida 2, para 0-0-0-1 at 39  weeks and 1 day who presented for an induction of labor secondary to  elevated blood pressures.  Patient was admitted for induction and was  started on Foley bulb induction that was placed on the 17th.  Throughout  the evening of the 17th, the Foley bulb was removed on March 20, 2007, at 9:30 a.m. and the cervix was found to be 4 cm, 50% effaced at a  -3 with a vertex presentation.  Patient was ruptured and had clear fluid  and at which time pitocin was started throughout the course of the day.  Patient made minimal change and the decision was made to progress with a  primary low transverse cesarean section after the patient did not  progress past 5 cm despite adequate labor via intrauterine pressure  catheter monitor.  Patient did undergo a primary low transverse C-  section on March 20 2007, at 1730.  Surgeons at that time of the  procedure were Dr. Marice Potter and Dr. Darrol Angel.  This procedure went without  complication.  Estimated blood loss was 900 mL.  Delivery of a female  infant with Apgar's of 7 and 9 and the weight of that baby was 7 pounds  8 ounces.  For details of this operation, please see the dictated  operative note.  Postpartum, the patient had an uneventful postpartum  stay with the exception of anemia secondary to blood loss during her  cesarean section and she did receive 2 units of packed red blood cells  that she responded well to.  Patient was discharged on postoperative day  #3 on March 23, 2007, with a normal exam, normal blood  pressures.  Her hemoglobin at the time of her discharge was 8.2, hematocrit was  24.1.   DISCHARGE DIAGNOSIS:  A 25 year old gravida 2, para 1-0-1-1  postoperative day 3 from status post primary low transverse cesarean  section for cephalopelvic disproportion.  The patient was discharged  home in stable status.  Breast and bottle feeding.  She was given a  prescription for Micronor for contraception and also prescriptions for  ibuprofen, Percocet, and instructed to continue her prenatal vitamins.  She was scheduled to have a blood pressure check in 2 weeks by the  [facility] Nurse and appointments were made for her to  follow up at [facility] for her 6-week postpartum visit.      Maylon Cos, C.N.M.      Lesly Dukes, M.D.  Electronically Signed    SS/MEDQ  D:  05/01/2007  T:  05/01/2007  Job:  [id number]

## 2010-09-26 ENCOUNTER — Emergency Department (HOSPITAL_COMMUNITY): Payer: Medicaid Other

## 2010-09-26 ENCOUNTER — Emergency Department (HOSPITAL_COMMUNITY)
Admission: EM | Admit: 2010-09-26 | Discharge: 2010-09-26 | Disposition: A | Discharge status: Home / Self Care | Payer: Medicaid Other | Attending: Emergency Medicine | Admitting: Emergency Medicine

## 2010-09-26 DIAGNOSIS — IMO0002 Reserved for concepts with insufficient information to code with codable children: Secondary | ICD-10-CM | POA: Insufficient documentation

## 2010-09-26 DIAGNOSIS — W1809XA Striking against other object with subsequent fall, initial encounter: Secondary | ICD-10-CM | POA: Insufficient documentation

## 2010-09-26 DIAGNOSIS — M25519 Pain in unspecified shoulder: Secondary | ICD-10-CM | POA: Insufficient documentation

## 2010-11-21 ENCOUNTER — Other Ambulatory Visit: Payer: Self-pay | Admitting: Obstetrics and Gynecology

## 2010-11-28 ENCOUNTER — Encounter (HOSPITAL_COMMUNITY): Payer: Self-pay | Admitting: *Deleted
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
## 2010-12-03 ENCOUNTER — Emergency Department (HOSPITAL_COMMUNITY): Payer: Medicaid Other

## 2010-12-03 ENCOUNTER — Emergency Department (HOSPITAL_COMMUNITY)
Admission: EM | Admit: 2010-12-03 | Discharge: 2010-12-03 | Disposition: A | Discharge status: Home / Self Care | Payer: Medicaid Other | Attending: Emergency Medicine | Admitting: Emergency Medicine

## 2010-12-03 DIAGNOSIS — S93409A Sprain of unspecified ligament of unspecified ankle, initial encounter: Secondary | ICD-10-CM | POA: Insufficient documentation

## 2010-12-03 DIAGNOSIS — J45909 Unspecified asthma, uncomplicated: Secondary | ICD-10-CM | POA: Insufficient documentation

## 2010-12-03 DIAGNOSIS — M25579 Pain in unspecified ankle and joints of unspecified foot: Secondary | ICD-10-CM | POA: Insufficient documentation

## 2010-12-05 ENCOUNTER — Encounter (HOSPITAL_COMMUNITY)
Admission: RE | Disposition: A | Discharge status: Home / Self Care | Payer: Self-pay | Source: Ambulatory Visit | Attending: Obstetrics and Gynecology

## 2010-12-05 ENCOUNTER — Other Ambulatory Visit: Payer: Self-pay | Admitting: Obstetrics and Gynecology

## 2010-12-05 ENCOUNTER — Ambulatory Visit (HOSPITAL_COMMUNITY)
Admission: RE | Admit: 2010-12-05 | Discharge: 2010-12-05 | Disposition: A | Discharge status: Home / Self Care | Payer: Medicaid Other | Source: Ambulatory Visit | Attending: Obstetrics and Gynecology | Admitting: Obstetrics and Gynecology

## 2010-12-05 ENCOUNTER — Ambulatory Visit (HOSPITAL_COMMUNITY): Payer: Medicaid Other | Admitting: Anesthesiology

## 2010-12-05 ENCOUNTER — Encounter (HOSPITAL_COMMUNITY): Payer: Self-pay | Admitting: Anesthesiology

## 2010-12-05 ENCOUNTER — Encounter (HOSPITAL_COMMUNITY): Payer: Self-pay

## 2010-12-05 DIAGNOSIS — N946 Dysmenorrhea, unspecified: Secondary | ICD-10-CM | POA: Insufficient documentation

## 2010-12-05 DIAGNOSIS — N920 Excessive and frequent menstruation with regular cycle: Secondary | ICD-10-CM | POA: Insufficient documentation

## 2010-12-05 HISTORY — DX: Major depressive disorder, single episode, unspecified: F32.9

## 2010-12-05 HISTORY — DX: Headache: R51

## 2010-12-05 HISTORY — DX: Unspecified osteoarthritis, unspecified site: M19.90

## 2010-12-05 HISTORY — DX: Depression, unspecified: F32.A

## 2010-12-05 HISTORY — DX: Reserved for inherently not codable concepts without codable children: IMO0001

## 2010-12-05 HISTORY — DX: Encounter for other specified aftercare: Z51.89

## 2010-12-05 LAB — CBC
MCHC: 32.9 g/dL (ref 30.0–36.0)
RDW: 13.1 % (ref 11.5–15.5)
WBC: 10.5 10*3/uL (ref 4.0–10.5)

## 2010-12-05 LAB — HCG, SERUM, QUALITATIVE: Preg, Serum: NEGATIVE

## 2010-12-05 SURGERY — DILATATION & CURETTAGE/HYSTEROSCOPY WITH HYDROTHERMAL ABLATION
Anesthesia: Choice | Wound class: Clean Contaminated

## 2010-12-05 MED ORDER — BUPIVACAINE-EPINEPHRINE 0.25% -1:200000 IJ SOLN
INTRAMUSCULAR | Status: DC | PRN
  Administered 2010-12-05: 10 mL

## 2010-12-05 MED ORDER — FENTANYL CITRATE 0.05 MG/ML IJ SOLN
INTRAMUSCULAR | Status: AC
  Administered 2010-12-05: 50 ug via INTRAVENOUS
  Filled 2010-12-05: qty 2

## 2010-12-05 MED ORDER — KETOROLAC TROMETHAMINE 30 MG/ML IJ SOLN
INTRAMUSCULAR | Status: DC | PRN
  Administered 2010-12-05: 30 mg via INTRAVENOUS

## 2010-12-05 MED ORDER — FENTANYL CITRATE 0.05 MG/ML IJ SOLN
INTRAMUSCULAR | Status: AC
  Filled 2010-12-05: qty 5

## 2010-12-05 MED ORDER — LIDOCAINE HCL (CARDIAC) 20 MG/ML IV SOLN
INTRAVENOUS | Status: AC
  Filled 2010-12-05: qty 5

## 2010-12-05 MED ORDER — FENTANYL CITRATE 0.05 MG/ML IJ SOLN
25.0000 ug | INTRAMUSCULAR | Status: DC | PRN
  Administered 2010-12-05: 50 ug via INTRAVENOUS
  Administered 2010-12-05: 25 ug via INTRAVENOUS

## 2010-12-05 MED ORDER — FENTANYL CITRATE 0.05 MG/ML IJ SOLN
INTRAMUSCULAR | Status: DC | PRN
  Administered 2010-12-05: 100 ug via INTRAVENOUS

## 2010-12-05 MED ORDER — ONDANSETRON HCL 4 MG/2ML IJ SOLN
INTRAMUSCULAR | Status: DC | PRN
  Administered 2010-12-05: 4 mg via INTRAVENOUS

## 2010-12-05 MED ORDER — ONDANSETRON HCL 4 MG/2ML IJ SOLN
INTRAMUSCULAR | Status: AC
  Filled 2010-12-05: qty 2

## 2010-12-05 MED ORDER — MIDAZOLAM HCL 5 MG/5ML IJ SOLN
INTRAMUSCULAR | Status: DC | PRN
  Administered 2010-12-05: 2 mg via INTRAVENOUS

## 2010-12-05 MED ORDER — MIDAZOLAM HCL 2 MG/2ML IJ SOLN
INTRAMUSCULAR | Status: AC
  Filled 2010-12-05: qty 2

## 2010-12-05 MED ORDER — GLYCOPYRROLATE 0.2 MG/ML IJ SOLN
INTRAMUSCULAR | Status: AC
  Filled 2010-12-05: qty 1

## 2010-12-05 MED ORDER — HYDROCODONE-ACETAMINOPHEN 5-500 MG PO TABS: 1.0000 | ORAL_TABLET | Freq: Four times a day (QID) | ORAL | Status: AC | PRN

## 2010-12-05 MED ORDER — IBUPROFEN 800 MG PO TABS: 800.0000 mg | ORAL_TABLET | Freq: Three times a day (TID) | ORAL | Status: AC | PRN

## 2010-12-05 MED ORDER — PROPOFOL 10 MG/ML IV EMUL: INTRAVENOUS | Status: DC | PRN

## 2010-12-05 MED ORDER — PROPOFOL 10 MG/ML IV EMUL
INTRAVENOUS | Status: AC
  Filled 2010-12-05: qty 20

## 2010-12-05 MED ORDER — SODIUM CHLORIDE 0.9 % IR SOLN
Status: DC | PRN
  Administered 2010-12-05: 3000 mL

## 2010-12-05 MED ORDER — LACTATED RINGERS IV SOLN
INTRAVENOUS | Status: DC
  Administered 2010-12-05 (×2): via INTRAVENOUS

## 2010-12-05 MED ORDER — DEXAMETHASONE SODIUM PHOSPHATE 10 MG/ML IJ SOLN
INTRAMUSCULAR | Status: AC
  Filled 2010-12-05: qty 1

## 2010-12-05 MED ORDER — LIDOCAINE HCL (CARDIAC) 20 MG/ML IV SOLN
INTRAVENOUS | Status: DC | PRN
  Administered 2010-12-05: 70 mg via INTRAVENOUS

## 2010-12-05 MED ORDER — PROPOFOL 10 MG/ML IV EMUL
INTRAVENOUS | Status: DC | PRN
  Administered 2010-12-05: 200 mg via INTRAVENOUS

## 2010-12-05 MED ORDER — KETOROLAC TROMETHAMINE 60 MG/2ML IM SOLN
INTRAMUSCULAR | Status: AC
  Filled 2010-12-05: qty 2

## 2010-12-05 MED ORDER — FENTANYL CITRATE 0.05 MG/ML IJ SOLN
INTRAMUSCULAR | Status: AC
  Filled 2010-12-05: qty 2

## 2010-12-05 SURGICAL SUPPLY — 16 items
CANISTER SUCTION 2500CC (MISCELLANEOUS) ×2 IMPLANT
CATH ROBINSON RED A/P 16FR (CATHETERS) ×2 IMPLANT
CLOTH BEACON ORANGE TIMEOUT ST (SAFETY) ×2 IMPLANT
CONTAINER PREFILL 10% NBF 60ML (FORM) ×4 IMPLANT
DRAPE CAMERA CLOSED 9X96 (DRAPES) ×2 IMPLANT
DRAPE HYSTEROSCOPY (DRAPE) ×2 IMPLANT
DRAPE UTILITY XL STRL (DRAPES) ×2 IMPLANT
GLOVE BIOGEL PI IND STRL 7.0 (GLOVE) ×1 IMPLANT
GLOVE BIOGEL PI INDICATOR 7.0 (GLOVE) ×1
GLOVE ECLIPSE 7.0 STRL STRAW (GLOVE) ×4 IMPLANT
GOWN PREVENTION PLUS LG XLONG (DISPOSABLE) ×2 IMPLANT
GOWN STRL REIN XL XLG (GOWN DISPOSABLE) ×4 IMPLANT
PACK VAGINAL MINOR WOMEN LF (CUSTOM PROCEDURE TRAY) ×2 IMPLANT
SET GENESYS HTA PROCERVA (MISCELLANEOUS) ×2 IMPLANT
TOWEL OR 17X24 6PK STRL BLUE (TOWEL DISPOSABLE) ×4 IMPLANT
WATER STERILE IRR 1000ML POUR (IV SOLUTION) ×2 IMPLANT

## 2010-12-05 NOTE — Transfer of Care (Signed)
Immediate Anesthesia Transfer of Care Note  Patient: Yvonne [name]  Procedure(s) Performed:  DILATATION & CURETTAGE/HYSTEROSCOPY WITH HYDROTHERMAL ABLATION  Patient Location: PACU  Anesthesia Type: General  Level of Consciousness: oriented, sedated and patient cooperative  Airway & Oxygen Therapy: Patient Spontanous Breathing and Patient connected to nasal cannula oxygen  Post-op Assessment: Report given to PACU RN  Post vital signs: stable  Complications: No apparent anesthesia complications
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
## 2010-12-05 NOTE — Op Note (Signed)
Preop Diagnosis: Dysmenorrhea, history of Menorrhagia   Postop Diagnosis: Dysmenorrhea, history of Menorrhagia   Procedure: DILATATION & CURETTAGE/HYSTEROSCOPY WITH HYDROTHERMAL ABLATION   Anesthesia: General via LMA  Attending: Purcell Nails, MD   Assistant: n/a  Findings: No obvious intracavitary lesions. Uterus sounded to 9cm.  Pathology: Endometrial Currettings  Fluids:  UOP: Quantity sufficient  EBL: minimal  Complications: none  Procedure: The patient was taken to the operating room after risks benefits and alternatives were discussed with patient, the patient verbalized understanding and consent signed and witnessed. The patient was placed under general anesthesia and prepped and draped in normal sterile fashion. A bivalve speculum was placed in the patient's vagina and the anterior lip of the cervix was grasped with a single-tooth tenaculum. A paracervical block administered using a total of 10cc of 1% lidocaine.  The cervix was dilated for passage of the hysteroscope. The uterus sounded to 9cm.  The hysteroscope was introduced into the uterine cavity with findings as noted above. A curettage was performed and currettings sent to pathology. The hysteroscope was reintroduced and hydrothermal ablation was performed without difficulty. The tenaculum and bivalve speculum were removed and there was good hemostasis at the tenaculum sites. Sponge lap and needle count was correct. The patient tolerated procedure well and was returned to the recovery room in good condition.

## 2010-12-06 NOTE — Anesthesia Postprocedure Evaluation (Signed)
Anesthesia Post Note  Patient: Yvonne [name]  Procedure(s) Performed:  DILATATION & CURETTAGE/HYSTEROSCOPY WITH HYDROTHERMAL ABLATION  Anesthesia type: General  Patient location: PACU  Post pain: Pain level controlled  Post assessment: Post-op Vital signs reviewed  Last Vitals:  Filed Vitals:   12/05/10 1500  BP: 122/59  Pulse: 62  Temp: 98.3 F (36.8 C)  Resp: 18    Post vital signs: Reviewed  Level of consciousness: sedated  Complications: No apparent anesthesia complications

## 2010-12-25 LAB — URINALYSIS, ROUTINE W REFLEX MICROSCOPIC
Ketones, ur: NEGATIVE
Nitrite: NEGATIVE
Protein, ur: NEGATIVE
Urobilinogen, UA: 0.2
pH: 8.5 — ABNORMAL HIGH

## 2010-12-25 LAB — POCT PREGNANCY, URINE
Operator id: 28833
Preg Test, Ur: NEGATIVE
Preg Test, Ur: NEGATIVE

## 2010-12-26 LAB — URINALYSIS, ROUTINE W REFLEX MICROSCOPIC
Bilirubin Urine: NEGATIVE
Hgb urine dipstick: NEGATIVE
Ketones, ur: NEGATIVE
Nitrite: NEGATIVE
Protein, ur: NEGATIVE
Specific Gravity, Urine: 1.025
Urobilinogen, UA: 1

## 2010-12-27 LAB — CBC
HCT: 30.7 — ABNORMAL LOW
Hemoglobin: 10.3 — ABNORMAL LOW
MCV: 76.1 — ABNORMAL LOW
Platelets: 421 — ABNORMAL HIGH
RDW: 16.9 — ABNORMAL HIGH

## 2010-12-28 LAB — CBC
HCT: 35.2 — ABNORMAL LOW
MCV: 76.1 — ABNORMAL LOW
Platelets: 459 — ABNORMAL HIGH
RBC: 4.63
WBC: 10.5

## 2010-12-28 LAB — URINE MICROSCOPIC-ADD ON

## 2010-12-28 LAB — COMPREHENSIVE METABOLIC PANEL
AST: 24
Albumin: 3.8
Alkaline Phosphatase: 130 — ABNORMAL HIGH
CO2: 22
Chloride: 106
Creatinine, Ser: 0.85
GFR calc Af Amer: >60
GFR calc non Af Amer: >60
Potassium: 3.4 — ABNORMAL LOW
Total Bilirubin: 0.3

## 2010-12-28 LAB — URINALYSIS, ROUTINE W REFLEX MICROSCOPIC
Bilirubin Urine: NEGATIVE
Glucose, UA: NEGATIVE
Hgb urine dipstick: NEGATIVE
Protein, ur: NEGATIVE
Urobilinogen, UA: 1

## 2010-12-28 LAB — DIFFERENTIAL
Basophils Absolute: 0
Basophils Relative: 0
Eosinophils Absolute: 0.3
Eosinophils Relative: 3
Monocytes Absolute: 0.5

## 2010-12-28 LAB — POCT PREGNANCY, URINE: Preg Test, Ur: NEGATIVE

## 2011-01-01 LAB — URINE MICROSCOPIC-ADD ON

## 2011-01-01 LAB — COMPREHENSIVE METABOLIC PANEL
ALT: 23
AST: 25
Alkaline Phosphatase: 115
CO2: 28
Calcium: 9.1
GFR calc Af Amer: >60
GFR calc non Af Amer: >60
Potassium: 3.9
Sodium: 138
Total Protein: 6.7

## 2011-01-01 LAB — PREGNANCY, URINE: Preg Test, Ur: NEGATIVE

## 2011-01-01 LAB — URINALYSIS, ROUTINE W REFLEX MICROSCOPIC
Bilirubin Urine: NEGATIVE
Glucose, UA: NEGATIVE
Hgb urine dipstick: NEGATIVE
Ketones, ur: NEGATIVE
Protein, ur: NEGATIVE
Urobilinogen, UA: 0.2

## 2011-01-01 LAB — DIFFERENTIAL
Basophils Absolute: 0.1
Eosinophils Relative: 3
Lymphocytes Relative: 27
Monocytes Absolute: 0.4

## 2011-01-01 LAB — CBC
MCHC: 32.5
RBC: 4.22

## 2011-01-05 LAB — COMPREHENSIVE METABOLIC PANEL
ALT: 23
ALT: 24
AST: 35
BUN: <1 — ABNORMAL LOW
CO2: 20
Calcium: 8.8
Chloride: 103
GFR calc Af Amer: >60
GFR calc non Af Amer: >60
Glucose, Bld: 126 — ABNORMAL HIGH
Potassium: 3.7
Sodium: 134 — ABNORMAL LOW
Sodium: 135
Total Bilirubin: 0.7
Total Protein: 6.3

## 2011-01-05 LAB — CBC
HCT: 22.7 — ABNORMAL LOW
Hemoglobin: 10.1 — ABNORMAL LOW
Hemoglobin: 10.6 — ABNORMAL LOW
Hemoglobin: 7.7 — CL
MCHC: 33.2
MCHC: 33.7
MCHC: 34
MCV: 81.8
MCV: 81.9
MCV: 82.4
Platelets: 345
Platelets: 365
Platelets: 378
Platelets: 423 — ABNORMAL HIGH
Platelets: 458 — ABNORMAL HIGH
RBC: 2.95 — ABNORMAL LOW
RBC: 3.17 — ABNORMAL LOW
RBC: 3.7 — ABNORMAL LOW
RDW: 13.7
RDW: 13.7
RDW: 15.4
WBC: 15.5 — ABNORMAL HIGH
WBC: 23.7 — ABNORMAL HIGH
WBC: 24.7 — ABNORMAL HIGH

## 2011-01-05 LAB — LACTATE DEHYDROGENASE: LDH: 109

## 2011-01-05 LAB — CROSSMATCH
ABO/RH(D): A POS
Antibody Screen: NEGATIVE

## 2011-01-05 LAB — TSH: TSH: 3.329

## 2011-01-05 LAB — URIC ACID: Uric Acid, Serum: 5.3

## 2011-01-08 LAB — URINALYSIS, ROUTINE W REFLEX MICROSCOPIC
Glucose, UA: NEGATIVE
Nitrite: NEGATIVE
Specific Gravity, Urine: <1.005 — ABNORMAL LOW
pH: 7

## 2011-01-08 LAB — URINE MICROSCOPIC-ADD ON

## 2011-01-08 LAB — URINE CULTURE: Colony Count: >=100000

## 2011-01-09 LAB — URINE MICROSCOPIC-ADD ON

## 2011-01-09 LAB — URINALYSIS, ROUTINE W REFLEX MICROSCOPIC
Bilirubin Urine: NEGATIVE
Glucose, UA: NEGATIVE
Hgb urine dipstick: NEGATIVE
Hgb urine dipstick: NEGATIVE
Nitrite: NEGATIVE
Protein, ur: NEGATIVE
Specific Gravity, Urine: <1.005 — ABNORMAL LOW
Specific Gravity, Urine: <1.005 — ABNORMAL LOW
Urobilinogen, UA: 0.2
Urobilinogen, UA: 0.2
pH: 7

## 2011-01-10 LAB — URINALYSIS, ROUTINE W REFLEX MICROSCOPIC
Bilirubin Urine: NEGATIVE
Hgb urine dipstick: NEGATIVE
Protein, ur: NEGATIVE
Urobilinogen, UA: 0.2

## 2011-01-10 LAB — URINE MICROSCOPIC-ADD ON

## 2011-01-11 LAB — WET PREP, GENITAL
Clue Cells Wet Prep HPF POC: NONE SEEN
Trich, Wet Prep: NONE SEEN
Yeast Wet Prep HPF POC: NONE SEEN

## 2011-01-11 LAB — URINALYSIS, ROUTINE W REFLEX MICROSCOPIC
Glucose, UA: NEGATIVE
pH: 7

## 2011-01-12 LAB — URINALYSIS, ROUTINE W REFLEX MICROSCOPIC
Bilirubin Urine: NEGATIVE
Glucose, UA: NEGATIVE
Ketones, ur: NEGATIVE
pH: 6.5

## 2011-01-18 LAB — WET PREP, GENITAL
Clue Cells Wet Prep HPF POC: NONE SEEN
Trich, Wet Prep: NONE SEEN

## 2011-01-18 LAB — URINALYSIS, ROUTINE W REFLEX MICROSCOPIC
Bilirubin Urine: NEGATIVE
Hgb urine dipstick: NEGATIVE
Nitrite: NEGATIVE
Specific Gravity, Urine: <1.005 — ABNORMAL LOW
pH: 7

## 2011-01-18 LAB — GC/CHLAMYDIA PROBE AMP, GENITAL: Chlamydia, DNA Probe: NEGATIVE

## 2011-03-19 ENCOUNTER — Emergency Department (HOSPITAL_COMMUNITY): Admission: EM | Admit: 2011-03-19 | Discharge: 2011-03-19 | Discharge status: Absent without leave | Payer: Self-pay

## 2011-03-19 ENCOUNTER — Encounter (HOSPITAL_COMMUNITY): Payer: Self-pay | Admitting: *Deleted

## 2011-03-19 ENCOUNTER — Encounter (HOSPITAL_COMMUNITY): Payer: Self-pay | Admitting: Emergency Medicine

## 2011-03-19 ENCOUNTER — Emergency Department (INDEPENDENT_AMBULATORY_CARE_PROVIDER_SITE_OTHER)
Admission: EM | Admit: 2011-03-19 | Discharge: 2011-03-19 | Disposition: A | Discharge status: Home / Self Care | Payer: PRIVATE HEALTH INSURANCE | Source: Home / Self Care | Attending: Family Medicine | Admitting: Family Medicine

## 2011-03-19 ENCOUNTER — Emergency Department (HOSPITAL_COMMUNITY)
Admission: EM | Admit: 2011-03-19 | Discharge: 2011-03-20 | Discharge status: Against Medical Advice | Payer: Medicaid Other | Attending: Emergency Medicine | Admitting: Emergency Medicine

## 2011-03-19 DIAGNOSIS — K2960 Other gastritis without bleeding: Secondary | ICD-10-CM

## 2011-03-19 DIAGNOSIS — Z0389 Encounter for observation for other suspected diseases and conditions ruled out: Secondary | ICD-10-CM | POA: Insufficient documentation

## 2011-03-19 DIAGNOSIS — M545 Low back pain: Secondary | ICD-10-CM

## 2011-03-19 HISTORY — DX: Dorsalgia, unspecified: M54.9

## 2011-03-19 HISTORY — DX: Other chronic pain: G89.29

## 2011-03-19 NOTE — ED Notes (Signed)
PT. REPORTS PROGRESSING CHRONIC  LOW BACK PAIN FOR 12 YEARS , WORSE TODAY , DENIES RECENT FALL OR INJURY.

## 2011-03-19 NOTE — ED Notes (Addendum)
C/O chronic back pain x 12 yrs; was pt of [facility] - states unable to return per their office due to outstanding bills.  Requesting muscle relaxer.  Also c/o lightheadedness, stomach ache, inability to eat x 1 month.

## 2011-03-19 NOTE — ED Notes (Signed)
[name] Sec pointed out that she didn't get vitals done. I called her name to get vitals and no answer. Will try again

## 2011-03-19 NOTE — ED Provider Notes (Signed)
History     CSN: [id number] Arrival date & time: 03/19/2011  8:09 PM   First MD Initiated Contact with Patient 03/19/11 1913      Chief Complaint  Patient presents with  . Back Pain    (Consider location/radiation/quality/duration/timing/severity/associated sxs/prior treatment) Patient is a 25 y.o. female presenting with back pain and abdominal pain. The history is provided by the patient.  Back Pain  This is a chronic problem. Episode onset: 12 year hx. The problem has been gradually worsening (unable to see neurosurg for surgery b/o 700.00 bill.). The pain is present in the lumbar spine. Associated symptoms include numbness and abdominal pain. Pertinent negatives include no fever.  Abdominal Pain The primary symptoms of the illness include abdominal pain, nausea, vomiting and diarrhea. The primary symptoms of the illness do not include fever. Episode onset: 1 month. The onset of the illness was gradual. The problem has been gradually worsening.  The illness is associated with NSAID use. Additional symptoms associated with the illness include anorexia and back pain. Associated symptoms comments: Lightheaded, not tolerating food..    Past Medical History  Diagnosis Date  . Asthma     albuterol inhaler  . Blood transfusion     2008 with c/s 4 units transfused  . Headache     otc ibuprofen 800mg   . 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 Depression   . Chronic back pain     Past Surgical History  Procedure Date  . Appendectomy   . Cesarean section   . Cholecystectomy   . Laparoscopy   . Hand surgery   . Tonsillectomy     T & A  . Tubal ligation   . Endometrial ablation     No family history on file.  History  Substance Use Topics  . Smoking status: Current Some Day Smoker    Types: Cigarettes  . Smokeless tobacco: Never Used  . Alcohol Use: 0.6 oz/week    1 Glasses of wine per week    OB History    Grav Para Term Preterm  Abortions TAB SAB Ect Mult Living                  Review of Systems  Constitutional: Positive for appetite change. Negative for fever.  Gastrointestinal: Positive for nausea, vomiting, abdominal pain, diarrhea and anorexia.  Musculoskeletal: Positive for back pain and gait problem.  Neurological: Positive for numbness.    Allergies  Reglan and Aspirin  Home Medications   Current Outpatient Rx  Name Route Sig Dispense Refill  . DIPHENHYDRAMINE HCL (SLEEP) 25 MG PO CAPS Oral Take 2 capsules by mouth at bedtime as needed. sleep     . IBUPROFEN 800 MG PO TABS Oral Take 800 mg by mouth every 8 (eight) hours as needed. pain       BP 121/80  Pulse 72  Temp(Src) 98.8 F (37.1 C) (Oral)  Resp 18  SpO2 99%  Physical Exam  Nursing note and vitals reviewed. Constitutional: She is oriented to person, place, and time. She appears well-developed and well-nourished. She appears distressed.  Abdominal: Soft. Bowel sounds are normal. She exhibits no mass. There is tenderness. There is no rebound and no guarding.  Musculoskeletal: She exhibits tenderness.       Lumbar back: She exhibits decreased range of motion, tenderness, bony tenderness and spasm.  Neurological: She is alert and oriented to person, place, and time.  Skin: Skin is warm and dry.    ED Course  Procedures (including critical care time)  Labs Reviewed - No data to display No results found.   1. Lumbar back pain   2. Gastritis due to nonsteroidal anti-inflammatory drug       MDM          Barkley Bruns, MD 03/19/11 2036

## 2011-03-20 NOTE — ED Notes (Signed)
No answer in any waiting rooms

## 2011-06-27 ENCOUNTER — Encounter (HOSPITAL_COMMUNITY): Payer: Self-pay | Admitting: Emergency Medicine

## 2011-06-27 ENCOUNTER — Emergency Department (HOSPITAL_COMMUNITY)
Admission: EM | Admit: 2011-06-27 | Discharge: 2011-06-27 | Disposition: A | Discharge status: Home / Self Care | Payer: Self-pay | Attending: Emergency Medicine | Admitting: Emergency Medicine

## 2011-06-27 ENCOUNTER — Encounter: Payer: Self-pay | Admitting: Obstetrics and Gynecology

## 2011-06-27 DIAGNOSIS — N390 Urinary tract infection, site not specified: Secondary | ICD-10-CM | POA: Insufficient documentation

## 2011-06-27 DIAGNOSIS — M479 Spondylosis, unspecified: Secondary | ICD-10-CM | POA: Insufficient documentation

## 2011-06-27 DIAGNOSIS — J45909 Unspecified asthma, uncomplicated: Secondary | ICD-10-CM | POA: Insufficient documentation

## 2011-06-27 DIAGNOSIS — G8929 Other chronic pain: Secondary | ICD-10-CM | POA: Insufficient documentation

## 2011-06-27 DIAGNOSIS — F17200 Nicotine dependence, unspecified, uncomplicated: Secondary | ICD-10-CM | POA: Insufficient documentation

## 2011-06-27 LAB — URINE MICROSCOPIC-ADD ON

## 2011-06-27 LAB — URINALYSIS, ROUTINE W REFLEX MICROSCOPIC
Bilirubin Urine: NEGATIVE
Ketones, ur: NEGATIVE mg/dL
Nitrite: NEGATIVE
pH: 7 (ref 5.0–8.0)

## 2011-06-27 MED ORDER — NITROFURANTOIN MONOHYD MACRO 100 MG PO CAPS: 100.0000 mg | ORAL_CAPSULE | Freq: Two times a day (BID) | ORAL | Status: AC

## 2011-06-27 NOTE — ED Provider Notes (Signed)
History     CSN: [id number]  Arrival date & time 06/27/11  1500   First MD Initiated Contact with Patient 06/27/11 1541      Chief Complaint  Patient presents with  . Dysuria  . Hematuria     HPI Pt presenting to ed with c/o dysuria and hematuria since yesterday pt states she has urinary frequency and urgency with small amount of urine. Pt denies fever, chills at this time. Pt states she had an oblation and no longer has periods  Past Medical History  Diagnosis Date  . Asthma     albuterol inhaler  . Blood transfusion     2008 with c/s 4 units transfused  . Headache     otc ibuprofen 800mg   . 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 Depression   . Chronic back pain     Past Surgical History  Procedure Date  . Appendectomy   . Cesarean section   . Cholecystectomy   . Laparoscopy   . Hand surgery   . Tonsillectomy     T & A  . Tubal ligation   . Endometrial ablation     No family history on file.  History  Substance Use Topics  . Smoking status: Current Everyday Smoker    Types: Cigarettes  . Smokeless tobacco: Never Used  . Alcohol Use: 0.6 oz/week    1 Glasses of wine per week     ocassionally    OB History    Grav Para Term Preterm Abortions TAB SAB Ect Mult Living                  Review of Systems  All other systems reviewed and are negative.    Allergies  Reglan and Aspirin  Home Medications   Current Outpatient Rx  Name Route Sig Dispense Refill  . DIAZEPAM 10 MG PO TABS Oral Take 10 mg by mouth every 12 (twelve) hours as needed. For back spasms.    . IBUPROFEN 200 MG PO TABS Oral Take 600 mg by mouth every 6 (six) hours as needed. For pain.    Marland Kitchen NITROFURANTOIN MONOHYD MACRO 100 MG PO CAPS Oral Take 1 capsule (100 mg total) by mouth 2 (two) times daily. 10 capsule 0    BP 117/65  Pulse 97  Temp(Src) 98.5 F (36.9 C) (Oral)  Resp 20  SpO2 100%  Physical Exam  Nursing note and vitals  reviewed. Constitutional: She is oriented to person, place, and time. She appears well-developed and well-nourished. No distress.  HENT:  Head: Normocephalic and atraumatic.  Eyes: Pupils are equal, round, and reactive to light.  Neck: Normal range of motion.  Cardiovascular: Normal rate and intact distal pulses.   Pulmonary/Chest: No respiratory distress.  Abdominal: Normal appearance. She exhibits no distension.  Musculoskeletal: Normal range of motion.  Neurological: She is alert and oriented to person, place, and time. No cranial nerve deficit.  Skin: Skin is warm and dry. No rash noted.  Psychiatric: She has a normal mood and affect. Her behavior is normal.    ED Course  Procedures (including critical care time)  Labs Reviewed  URINALYSIS, ROUTINE W REFLEX MICROSCOPIC - Abnormal; Notable for the following:    APPearance CLOUDY (*)    Hgb urine dipstick LARGE (*)    Leukocytes, UA LARGE (*)    All other components within normal limits  POCT PREGNANCY, URINE  GLUCOSE,  CAPILLARY  URINE MICROSCOPIC-ADD ON   No results found.   1. UTI (lower urinary tract infection)       MDM          Nelia Shi, MD 06/27/11 [phone]

## 2011-06-27 NOTE — Discharge Instructions (Signed)
Urine Culture Collection, Female  You will collect a sample of pee (urine) in a cup. Read the instructions below before beginning. If you have any questions, ask the nurse before you begin. Follow the instructions carefully. 1. Wash your hands with soap and water and dry them thoroughly.  2. Open the lid of the cup. Be careful not to touch the inside.  3. Clean the private (genital) area. 1. Sit over the toilet. Use the fingers of one hand to separate and hold open the folds of the skin in your private area.  2. Clean the pee (urinary) opening and surrounding area with the gauze, wiping from front to back. Throw away the gauze in the trash, not the toilet.  3. Repeat step "b"2 more times.  4. With the folds of skin still separated, pee a small amount into toilet. STOP, then pee into the cup. Fill the cup half way.  5. Put the lid on the cup tightly.  6. Wash your hands with soap and water.  7. If you were given a label, put the label on the cup.  8. Give the cup to the nurse.  Document Released: 03/01/2008 Document Revised: 03/08/2011 Document Reviewed: 03/01/2008 [employer] Patient Information 2012 [employer]. 

## 2011-06-27 NOTE — ED Notes (Signed)
Pt presenting to ed with c/o dysuria and hematuria since yesterday pt states she has urinary frequency and urgency with small amount of urine. Pt denies fever, chills at this time. Pt states she had an oblation and no longer has periods

## 2011-07-24 ENCOUNTER — Emergency Department (HOSPITAL_COMMUNITY)
Admission: EM | Admit: 2011-07-24 | Discharge: 2011-07-24 | Disposition: A | Discharge status: Home / Self Care | Payer: PRIVATE HEALTH INSURANCE | Attending: Emergency Medicine | Admitting: Emergency Medicine

## 2011-07-24 ENCOUNTER — Encounter (HOSPITAL_COMMUNITY): Payer: Self-pay | Admitting: Emergency Medicine

## 2011-07-24 DIAGNOSIS — F17200 Nicotine dependence, unspecified, uncomplicated: Secondary | ICD-10-CM | POA: Insufficient documentation

## 2011-07-24 DIAGNOSIS — Z043 Encounter for examination and observation following other accident: Secondary | ICD-10-CM | POA: Insufficient documentation

## 2011-07-24 DIAGNOSIS — M549 Dorsalgia, unspecified: Secondary | ICD-10-CM | POA: Insufficient documentation

## 2011-07-24 DIAGNOSIS — G8929 Other chronic pain: Secondary | ICD-10-CM | POA: Insufficient documentation

## 2011-07-24 MED ORDER — HYDROCODONE-ACETAMINOPHEN 5-500 MG PO TABS: 1.0000 | ORAL_TABLET | Freq: Four times a day (QID) | ORAL | Status: AC | PRN

## 2011-07-24 MED ORDER — IBUPROFEN 200 MG PO TABS
600.0000 mg | ORAL_TABLET | Freq: Once | ORAL | Status: AC
  Administered 2011-07-24: 600 mg via ORAL
  Filled 2011-07-24: qty 3

## 2011-07-24 MED ORDER — IBUPROFEN 600 MG PO TABS: 600.0000 mg | ORAL_TABLET | Freq: Three times a day (TID) | ORAL | Status: AC | PRN
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
## 2011-07-24 NOTE — Discharge Instructions (Signed)
Back Pain, Adult Low back pain is very common. About 1 in 5 people have back pain.The cause of low back pain is rarely dangerous. The pain often gets better over time.About half of people with a sudden onset of back pain feel better in just 2 weeks. About 8 in 10 people feel better by 6 weeks.  CAUSES Some common causes of back pain include:  Strain of the muscles or ligaments supporting the spine.   Wear and tear (degeneration) of the spinal discs.   Arthritis.   Direct injury to the back.  DIAGNOSIS Most of the time, the direct cause of low back pain is not known.However, back pain can be treated effectively even when the exact cause of the pain is unknown.Answering your caregiver's questions about your overall health and symptoms is one of the most accurate ways to make sure the cause of your pain is not dangerous. If your caregiver needs more information, he or she may order lab work or imaging tests (X-rays or MRIs).However, even if imaging tests show changes in your back, this usually does not require surgery. HOME CARE INSTRUCTIONS For many people, back pain returns.Since low back pain is rarely dangerous, it is often a condition that people can learn to manageon their own.   Remain active. It is stressful on the back to sit or stand in one place. Do not sit, drive, or stand in one place for more than 30 minutes at a time. Take short walks on level surfaces as soon as pain allows.Try to increase the length of time you walk each day.   Do not stay in bed.Resting more than 1 or 2 days can delay your recovery.   Do not avoid exercise or work.Your body is made to move.It is not dangerous to be active, even though your back may hurt.Your back will likely heal faster if you return to being active before your pain is gone.   Pay attention to your body when you bend and lift. Many people have less discomfortwhen lifting if they bend their knees, keep the load close to their  bodies,and avoid twisting. Often, the most comfortable positions are those that put less stress on your recovering back.   Find a comfortable position to sleep. Use a firm mattress and lie on your side with your knees slightly bent. If you lie on your back, put a pillow under your knees.   Only take over-the-counter or prescription medicines as directed by your caregiver. Over-the-counter medicines to reduce pain and inflammation are often the most helpful.Your caregiver may prescribe muscle relaxant drugs.These medicines help dull your pain so you can more quickly return to your normal activities and healthy exercise.   Put ice on the injured area.   Put ice in a plastic bag.   Place a towel between your skin and the bag.   Leave the ice on for 15 to 20 minutes, 3 to 4 times a day for the first 2 to 3 days. After that, ice and heat may be alternated to reduce pain and spasms.   Ask your caregiver about trying back exercises and gentle massage. This may be of some benefit.   Avoid feeling anxious or stressed.Stress increases muscle tension and can worsen back pain.It is important to recognize when you are anxious or stressed and learn ways to manage it.Exercise is a great option.  SEEK MEDICAL CARE IF:  You have pain that is not relieved with rest or medicine.   You have   pain that does not improve in 1 week.   You have new symptoms.   You are generally not feeling well.  SEEK IMMEDIATE MEDICAL CARE IF:   You have pain that radiates from your back into your legs.   You develop new bowel or bladder control problems.   You have unusual weakness or numbness in your arms or legs.   You develop nausea or vomiting.   You develop abdominal pain.   You feel faint.  Document Released: 03/19/2005 Document Revised: 03/08/2011 Document Reviewed: 08/07/2010 [employer] Patient Information 2012 [employer]. 

## 2011-08-21 ENCOUNTER — Encounter (HOSPITAL_COMMUNITY): Payer: Self-pay | Admitting: Cardiology

## 2011-08-21 ENCOUNTER — Emergency Department (INDEPENDENT_AMBULATORY_CARE_PROVIDER_SITE_OTHER)
Admission: EM | Admit: 2011-08-21 | Discharge: 2011-08-21 | Disposition: A | Discharge status: Home / Self Care | Payer: Self-pay | Source: Home / Self Care | Attending: Emergency Medicine | Admitting: Emergency Medicine

## 2011-08-21 DIAGNOSIS — H109 Unspecified conjunctivitis: Secondary | ICD-10-CM

## 2011-08-21 DIAGNOSIS — H01009 Unspecified blepharitis unspecified eye, unspecified eyelid: Secondary | ICD-10-CM

## 2011-08-21 DIAGNOSIS — H01006 Unspecified blepharitis left eye, unspecified eyelid: Secondary | ICD-10-CM

## 2011-08-21 MED ORDER — TOBRAMYCIN 0.3 % OP SOLN: 1.0000 [drp] | Freq: Four times a day (QID) | OPHTHALMIC | Status: AC

## 2011-08-21 NOTE — ED Notes (Signed)
Pt reports left eye pain, itching, and drainage that started 2 1/2 days ago. Eye noted to be red and watery at this time. Pt has had a fever off and on no numbers to reports. Pt tried clear eyes but it seemed to worsen her symptoms.

## 2011-08-21 NOTE — ED Provider Notes (Signed)
History     CSN: [id number]  Arrival date & time 08/21/11  7829   First MD Initiated Contact with Patient 08/21/11 [id number]      Chief Complaint  Patient presents with  . Eye Problem    (Consider location/radiation/quality/duration/timing/severity/associated sxs/prior treatment) HPI Comments: Patient presents urgent care with ongoing left-sided itching and drainage out of her left lower eyelid. She has been sneezing and with marked rhinorrhea and congestion been using some over-the-counter [employer] generic medicines for her allergies. He been noticing more tearing than redness and now her lower eyelid is swollen. Denies any eye injury or referring bodies or visual changes. Patient denies any headaches, or paresthesias  Patient is a 26 y.o. female presenting with eye problem. The history is provided by the patient.  Eye Problem  This is a new problem. The current episode started 2 days ago. The problem occurs constantly. The problem has been gradually worsening. There is pain in the left eye. There was no injury mechanism. The pain is at a severity of 6/10. The pain is mild. There is no history of trauma to the eye. There is no known exposure to pink eye. She does not wear contacts. Associated symptoms include itching. Pertinent negatives include no blurred vision, no decreased vision, no foreign body sensation, no photophobia, no nausea and no weakness. She has tried nothing for the symptoms. The treatment provided no relief.    Past Medical History  Diagnosis Date  . Asthma     albuterol inhaler  . Blood transfusion     2008 with c/s 4 units transfused  . Headache     otc ibuprofen 800mg   . 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 Depression   . Chronic back pain     Past Surgical History  Procedure Date  . Appendectomy   . Cesarean section   . Cholecystectomy   . Laparoscopy   . Hand surgery   . Tonsillectomy     T & A  . Tubal ligation   .  Endometrial ablation     No family history on file.  History  Substance Use Topics  . Smoking status: Current Everyday Smoker -- 0.5 packs/day    Types: Cigarettes  . Smokeless tobacco: Never Used  . Alcohol Use: 0.6 oz/week    1 Glasses of wine per week     ocassionally    OB History    Grav Para Term Preterm Abortions TAB SAB Ect Mult Living                  Review of Systems  Constitutional: Negative for chills and activity change.  HENT: Negative for neck stiffness and ear discharge.   Eyes: Positive for pain and itching. Negative for blurred vision, photophobia and visual disturbance.  Respiratory: Negative for cough.   Gastrointestinal: Negative for nausea.  Skin: Positive for itching.  Neurological: Negative for weakness.    Allergies  Reglan and Aspirin  Home Medications   Current Outpatient Rx  Name Route Sig Dispense Refill  . DIAZEPAM 10 MG PO TABS Oral Take 10 mg by mouth daily as needed. For back spasms.    Marland Kitchen DIPHENHYDRAMINE HCL 25 MG PO CAPS Oral Take 50 mg by mouth 2 (two) times daily as needed. For allergies.    . TOBRAMYCIN SULFATE 0.3 % OP SOLN Left Eye Place 1 drop into the left eye every 6 (six) hours. 5  mL 0    BP 126/73  Pulse 77  Temp(Src) 98.5 F (36.9 C) (Oral)  Resp 20  SpO2 97%  Physical Exam  Nursing note and vitals reviewed. Constitutional: She appears well-developed and well-nourished.  HENT:  Head: Normocephalic.  Eyes: Pupils are equal, round, and reactive to light. Right eye exhibits no discharge and no exudate. No foreign body present in the right eye. Left eye exhibits chemosis, discharge and exudate. Left eye exhibits no hordeolum. No foreign body present in the left eye. Right conjunctiva is injected. Left conjunctiva is injected. No scleral icterus. Left eye exhibits normal extraocular motion.    Neurological: She is alert.  Skin: No rash noted. No erythema.    ED Course  Procedures (including critical care  time)  Labs Reviewed - No data to display No results found.   1. Blepharitis of left eye   2. Conjunctivitis of left eye       MDM  Patient with coexistent seasonal allergies and moderate left lower eyelid blepharitis. Possibly secondarily infected. Discuss treatment plan and followup care as necessary patient what symptoms will work further evaluation patient agree with treatment plan and followup care as necessary        Jimmie Molly, MD 08/21/11 1410

## 2011-08-21 NOTE — Discharge Instructions (Signed)
Continue with your antihistamine and hydrate your eyes as discussed along with this antibiotic.    Blepharitis Blepharitis is redness, soreness, and swelling (inflammation) of one or both eyelids. It may be caused by an allergic reaction or a bacterial infection. Blepharitis may also be associated with reddened, scaly skin (seborrhea) of the scalp and eyebrows. While you sleep, eye discharge may cause your eyelashes to stick together. Your eyelids may itch, burn, swell, and may lose their lashes. These will grow back. Your eyes may become sensitive. Blepharitis may recur and need repeated treatment. If this is the case, you may require further evaluation by an eye specialist (ophthalmologist). HOME CARE INSTRUCTIONS   Keep your hands clean.   Use a clean towel each time you dry your eyelids. Do not use this towel to clean other areas. Do not share a towel or makeup with anyone.   Wash your eyelids with warm water or warm water mixed with a small amount of baby shampoo. Do this twice a day or as often as needed.   Wash your face and eyebrows at least once a day.   Use warm compresses 2 times a day for 10 minutes at a time, or as directed by your caregiver.   Apply antibiotic ointment as directed by your caregiver.   Avoid rubbing your eyes.   Avoid wearing makeup until you get better.   Follow up with your caregiver as directed.  SEEK IMMEDIATE MEDICAL CARE IF:   You have pain, redness, or swelling that gets worse or spreads to other parts of your face.   Your vision changes, or you have pain when looking at lights or moving objects.   You have a fever.   Your symptoms continue for longer than 2 to 4 days or become worse.  MAKE SURE YOU:   Understand these instructions.   Will watch your condition.   Will get help right away if you are not doing well or get worse.  Document Released: 03/16/2000 Document Revised: 03/08/2011 Document Reviewed: 04/26/2010 [facility] Patient  Information 2012 [place], [place].Blepharitis Blepharitis is redness, soreness, and swelling (inflammation) of one or both eyelids. It may be caused by an allergic reaction or a bacterial infection. Blepharitis may also be associated with reddened, scaly skin (seborrhea) of the scalp and eyebrows. While you sleep, eye discharge may cause your eyelashes to stick together. Your eyelids may itch, burn, swell, and may lose their lashes. These will grow back. Your eyes may become sensitive. Blepharitis may recur and need repeated treatment. If this is the case, you may require further evaluation by an eye specialist (ophthalmologist). HOME CARE INSTRUCTIONS   Keep your hands clean.   Use a clean towel each time you dry your eyelids. Do not use this towel to clean other areas. Do not share a towel or makeup with anyone.   Wash your eyelids with warm water or warm water mixed with a small amount of baby shampoo. Do this twice a day or as often as needed.   Wash your face and eyebrows at least once a day.   Use warm compresses 2 times a day for 10 minutes at a time, or as directed by your caregiver.   Apply antibiotic ointment as directed by your caregiver.   Avoid rubbing your eyes.   Avoid wearing makeup until you get better.   Follow up with your caregiver as directed.  SEEK IMMEDIATE MEDICAL CARE IF:   You have pain, redness, or swelling that gets  worse or spreads to other parts of your face.   Your vision changes, or you have pain when looking at lights or moving objects.   You have a fever.   Your symptoms continue for longer than 2 to 4 days or become worse.  MAKE SURE YOU:   Understand these instructions.   Will watch your condition.   Will get help right away if you are not doing well or get worse.  Document Released: 03/16/2000 Document Revised: 03/08/2011 Document Reviewed: 04/26/2010 [facility] Patient Information 2012 [place], [place].

## 2011-12-04 ENCOUNTER — Encounter (HOSPITAL_COMMUNITY): Payer: Self-pay | Admitting: *Deleted

## 2011-12-04 ENCOUNTER — Emergency Department (HOSPITAL_COMMUNITY)
Admission: EM | Admit: 2011-12-04 | Discharge: 2011-12-04 | Disposition: A | Discharge status: Home / Self Care | Payer: Medicaid Other | Attending: Emergency Medicine | Admitting: Emergency Medicine

## 2011-12-04 DIAGNOSIS — M479 Spondylosis, unspecified: Secondary | ICD-10-CM | POA: Insufficient documentation

## 2011-12-04 DIAGNOSIS — M549 Dorsalgia, unspecified: Secondary | ICD-10-CM

## 2011-12-04 DIAGNOSIS — J45909 Unspecified asthma, uncomplicated: Secondary | ICD-10-CM | POA: Insufficient documentation

## 2011-12-04 DIAGNOSIS — N390 Urinary tract infection, site not specified: Secondary | ICD-10-CM | POA: Insufficient documentation

## 2011-12-04 DIAGNOSIS — F329 Major depressive disorder, single episode, unspecified: Secondary | ICD-10-CM | POA: Insufficient documentation

## 2011-12-04 DIAGNOSIS — F3289 Other specified depressive episodes: Secondary | ICD-10-CM | POA: Insufficient documentation

## 2011-12-04 DIAGNOSIS — G8929 Other chronic pain: Secondary | ICD-10-CM | POA: Insufficient documentation

## 2011-12-04 LAB — URINALYSIS, ROUTINE W REFLEX MICROSCOPIC
Ketones, ur: NEGATIVE mg/dL
Nitrite: NEGATIVE
Protein, ur: 100 mg/dL — AB
Urobilinogen, UA: 0.2 mg/dL (ref 0.0–1.0)
pH: 6 (ref 5.0–8.0)

## 2011-12-04 MED ORDER — KETOROLAC TROMETHAMINE 60 MG/2ML IM SOLN
60.0000 mg | Freq: Once | INTRAMUSCULAR | Status: AC
  Administered 2011-12-04: 60 mg via INTRAMUSCULAR
  Filled 2011-12-04: qty 2

## 2011-12-04 MED ORDER — HYDROCODONE-ACETAMINOPHEN 5-325 MG PO TABS: 2.0000 | ORAL_TABLET | ORAL | Status: AC | PRN

## 2011-12-04 MED ORDER — SULFAMETHOXAZOLE-TRIMETHOPRIM 800-160 MG PO TABS: 1.0000 | ORAL_TABLET | Freq: Two times a day (BID) | ORAL | Status: AC

## 2011-12-04 NOTE — ED Provider Notes (Signed)
Medical screening examination/treatment/procedure(s) were performed by non-physician practitioner and as supervising physician I was immediately available for consultation/collaboration.  Emina Ribaudo R. Saralyn Willison, MD 12/04/11 1627 
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
## 2012-01-20 ENCOUNTER — Encounter (HOSPITAL_COMMUNITY): Payer: Self-pay | Admitting: *Deleted

## 2012-01-20 ENCOUNTER — Emergency Department (HOSPITAL_COMMUNITY)
Admission: EM | Admit: 2012-01-20 | Discharge: 2012-01-20 | Disposition: A | Discharge status: Home / Self Care | Payer: Medicaid Other | Attending: Emergency Medicine | Admitting: Emergency Medicine

## 2012-01-20 DIAGNOSIS — M25569 Pain in unspecified knee: Secondary | ICD-10-CM | POA: Insufficient documentation

## 2012-01-20 DIAGNOSIS — M129 Arthropathy, unspecified: Secondary | ICD-10-CM | POA: Insufficient documentation

## 2012-01-20 DIAGNOSIS — F17200 Nicotine dependence, unspecified, uncomplicated: Secondary | ICD-10-CM | POA: Insufficient documentation

## 2012-01-20 DIAGNOSIS — G8929 Other chronic pain: Secondary | ICD-10-CM | POA: Insufficient documentation

## 2012-01-20 MED ORDER — IBUPROFEN 800 MG PO TABS: 600.0000 mg | ORAL_TABLET | Freq: Once | ORAL | Status: DC

## 2012-01-20 MED ORDER — IBUPROFEN 800 MG PO TABS
800.0000 mg | ORAL_TABLET | Freq: Once | ORAL | Status: AC
  Administered 2012-01-20: 800 mg via ORAL
  Filled 2012-01-20: qty 1

## 2012-01-20 NOTE — ED Notes (Signed)
Pt reports left knee pain. Has been working 12 hour days at work. Reports pain 8/10 started last night and has increased today. Difficulty bearing weight on left leg. Numbness and tingling in left knee.

## 2012-01-20 NOTE — ED Notes (Signed)
Pt states she's on her feet all the time at work. Pt denies trauma to knee. States knee is numb from the pain. Pt sitting in wheelchair upon assessment.

## 2012-01-20 NOTE — ED Provider Notes (Signed)
History     CSN: [id number]  Arrival date & time 01/20/12  4098   First MD Initiated Contact with Patient 01/20/12 1909      Chief Complaint  Patient presents with  . 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  Does not remember any specific injury, but states that her left knee has been painful for the past 4, days.  She's taken over-the-counter ibuprofen, one time yesterday without any relief  Patient is a 26 y.o. female presenting with knee pain. The history is provided by the patient.  Knee Pain This is a new problem. The current episode started in the past 7 days. The problem has been unchanged. Associated symptoms include weakness. Pertinent negatives include no fever, joint swelling or numbness.    Past Medical History  Diagnosis Date  . Asthma     albuterol inhaler  . Blood transfusion     2008 with c/s 4 units transfused  . Headache     otc ibuprofen 800mg   . 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 Depression   . Chronic back pain     Past Surgical History  Procedure Date  . Appendectomy   . Cesarean section   . Cholecystectomy   . Laparoscopy   . Hand surgery   . Tonsillectomy     T & A  . Tubal ligation   . Endometrial ablation     History reviewed. No pertinent family history.  History  Substance Use Topics  . Smoking status: Current Every Day Smoker -- 0.5 packs/day    Types: Cigarettes  . Smokeless tobacco: Never Used  . Alcohol Use: 0.6 oz/week    1 Glasses of wine per week     ocassionally    OB History    Grav Para Term Preterm Abortions TAB SAB Ect Mult Living                  Review of Systems  Constitutional: Negative for fever.  Musculoskeletal: Negative for joint swelling.  Skin: Negative for wound.  Neurological: Positive for weakness.  Negative for numbness.    Allergies  Reglan; Aspirin; and Bee venom  Home Medications   Current Outpatient Rx  Name Route Sig Dispense Refill  . IBUPROFEN 200 MG PO TABS Oral Take 800 mg by mouth every 6 (six) hours as needed. pain    . IBUPROFEN 800 MG PO TABS Oral Take 1 tablet (800 mg total) by mouth once. 30 tablet 0    BP 124/69  Pulse 104  Temp 98.4 F (36.9 C) (Oral)  Resp 16  SpO2 98%  Physical Exam  Constitutional: She is oriented to person, place, and time. She appears well-developed and well-nourished.  HENT:  Head: Normocephalic.  Eyes: Pupils are equal, round, and reactive to light.  Neck: Normal range of motion.  Cardiovascular: Normal rate.   Pulmonary/Chest: Effort normal.  Musculoskeletal: She exhibits tenderness. She exhibits no edema.  Neurological: She is alert and oriented to person, place, and time.  Skin: Skin is warm.    ED Course  Procedures (including critical care time)  Labs Reviewed - No data to display No results found.   1. Knee pain       MDM  Will treat this patient with a knee sleeve, regular doses of ibuprofen, and light duty  at work, as she states she can afford to miss any work         Arman Filter, NP 01/20/12 1946  Arman Filter, NP 01/20/12 1946

## 2012-01-21 NOTE — ED Provider Notes (Signed)
Medical screening examination/treatment/procedure(s) were performed by non-physician practitioner and as supervising physician I was immediately available for consultation/collaboration.  Phillipe Clemon R. Gittel Mccamish, MD 01/21/12 0011 

## 2012-02-21 ENCOUNTER — Encounter (HOSPITAL_COMMUNITY): Payer: Self-pay | Admitting: *Deleted

## 2012-02-21 ENCOUNTER — Emergency Department (HOSPITAL_COMMUNITY)
Admission: EM | Admit: 2012-02-21 | Discharge: 2012-02-21 | Disposition: A | Discharge status: Home / Self Care | Payer: Medicaid Other | Attending: Emergency Medicine | Admitting: Emergency Medicine

## 2012-02-21 DIAGNOSIS — M549 Dorsalgia, unspecified: Secondary | ICD-10-CM | POA: Insufficient documentation

## 2012-02-21 DIAGNOSIS — F17200 Nicotine dependence, unspecified, uncomplicated: Secondary | ICD-10-CM | POA: Insufficient documentation

## 2012-02-21 DIAGNOSIS — R350 Frequency of micturition: Secondary | ICD-10-CM | POA: Insufficient documentation

## 2012-02-21 DIAGNOSIS — R110 Nausea: Secondary | ICD-10-CM | POA: Insufficient documentation

## 2012-02-21 DIAGNOSIS — Z87898 Personal history of other specified conditions: Secondary | ICD-10-CM | POA: Insufficient documentation

## 2012-02-21 DIAGNOSIS — R1013 Epigastric pain: Secondary | ICD-10-CM | POA: Insufficient documentation

## 2012-02-21 DIAGNOSIS — R197 Diarrhea, unspecified: Secondary | ICD-10-CM | POA: Insufficient documentation

## 2012-02-21 DIAGNOSIS — M129 Arthropathy, unspecified: Secondary | ICD-10-CM | POA: Insufficient documentation

## 2012-02-21 DIAGNOSIS — G8929 Other chronic pain: Secondary | ICD-10-CM | POA: Insufficient documentation

## 2012-02-21 DIAGNOSIS — J45909 Unspecified asthma, uncomplicated: Secondary | ICD-10-CM | POA: Insufficient documentation

## 2012-02-21 LAB — CBC WITH DIFFERENTIAL/PLATELET
Basophils Absolute: 0 10*3/uL (ref 0.0–0.1)
Basophils Relative: 0 % (ref 0–1)
Lymphocytes Relative: 23 % (ref 12–46)
MCHC: 33.9 g/dL (ref 30.0–36.0)
Monocytes Absolute: 0.8 10*3/uL (ref 0.1–1.0)
Neutro Abs: 8.3 10*3/uL — ABNORMAL HIGH (ref 1.7–7.7)
Neutrophils Relative %: 67 % (ref 43–77)
Platelets: 277 10*3/uL (ref 150–400)
RDW: 12.7 % (ref 11.5–15.5)
WBC: 12.3 10*3/uL — ABNORMAL HIGH (ref 4.0–10.5)

## 2012-02-21 LAB — COMPREHENSIVE METABOLIC PANEL
ALT: 12 U/L (ref 0–35)
AST: 15 U/L (ref 0–37)
Albumin: 3.8 g/dL (ref 3.5–5.2)
CO2: 25 mEq/L (ref 19–32)
Chloride: 102 mEq/L (ref 96–112)
Creatinine, Ser: 0.76 mg/dL (ref 0.50–1.10)
GFR calc non Af Amer: >90 mL/min (ref >90)
Sodium: 137 mEq/L (ref 135–145)
Total Bilirubin: 0.3 mg/dL (ref 0.3–1.2)

## 2012-02-21 LAB — GLUCOSE, CAPILLARY: Glucose-Capillary: 115 mg/dL — ABNORMAL HIGH (ref 70–99)

## 2012-02-21 MED ORDER — LOPERAMIDE HCL 2 MG PO CAPS
2.0000 mg | ORAL_CAPSULE | ORAL | Status: DC | PRN
  Administered 2012-02-21: 2 mg via ORAL
  Filled 2012-02-21: qty 1

## 2012-02-21 MED ORDER — BISMUTH SUBSALICYLATE 262 MG PO CHEW: 524.0000 mg | CHEWABLE_TABLET | ORAL | Status: DC | PRN

## 2012-02-21 MED ORDER — OXYCODONE-ACETAMINOPHEN 5-325 MG PO TABS: ORAL_TABLET | ORAL | Status: DC

## 2012-02-21 MED ORDER — MORPHINE SULFATE 4 MG/ML IJ SOLN
4.0000 mg | Freq: Once | INTRAMUSCULAR | Status: AC
  Administered 2012-02-21: 4 mg via INTRAVENOUS
  Filled 2012-02-21: qty 1

## 2012-02-21 MED ORDER — ONDANSETRON HCL 4 MG/2ML IJ SOLN
4.0000 mg | Freq: Once | INTRAMUSCULAR | Status: AC
  Administered 2012-02-21: 4 mg via INTRAVENOUS
  Filled 2012-02-21: qty 2

## 2012-02-21 MED ORDER — LOPERAMIDE HCL 2 MG PO CAPS: 2.0000 mg | ORAL_CAPSULE | Freq: Four times a day (QID) | ORAL | Status: DC | PRN

## 2012-02-21 MED ORDER — BISMUTH SUBSALICYLATE 262 MG/15ML PO SUSP: 30.0000 mL | ORAL | Status: DC | PRN

## 2012-02-21 MED ORDER — SODIUM CHLORIDE 0.9 % IV BOLUS (SEPSIS)
1000.0000 mL | Freq: Once | INTRAVENOUS | Status: AC
  Administered 2012-02-21: 1000 mL via INTRAVENOUS

## 2012-02-21 NOTE — ED Provider Notes (Signed)
Medical screening examination/treatment/procedure(s) were performed by non-physician practitioner and as supervising physician I was immediately available for consultation/collaboration.   Lyanne Co, MD 02/21/12 2014

## 2012-02-21 NOTE — ED Provider Notes (Signed)
History     CSN: [id number]  Arrival date & time 02/21/12  1554   First MD Initiated Contact with Patient 02/21/12 1737      Chief Complaint  Patient presents with  . Abdominal Pain  . Diarrhea    (Consider location/radiation/quality/duration/timing/severity/associated sxs/prior treatment) HPI  Yvonne [name] is a 26 y.o. female complaining of diarrhea, polyuria, and polydipsia, and eating more than normal. diffuse abdominal pain rated at 8/10 not relieved with ibuprofen. Denies fever, Nausea, area, vaginal discharge.  PCP none  Past Medical History  Diagnosis Date  . Asthma     albuterol inhaler  . Blood transfusion     2008 with c/s 4 units transfused  . Headache     otc ibuprofen 800mg   . 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 Depression   . Chronic back pain     Past Surgical History  Procedure Date  . Appendectomy   . Cesarean section   . Cholecystectomy   . Laparoscopy   . Hand surgery   . Tonsillectomy     T & A  . Tubal ligation   . Endometrial ablation     No family history on file.  History  Substance Use Topics  . Smoking status: Current Every Day Smoker -- 0.5 packs/day    Types: Cigarettes  . Smokeless tobacco: Never Used  . Alcohol Use: 0.6 oz/week    1 Glasses of wine per week     Comment: ocassionally    OB History    Grav Para Term Preterm Abortions TAB SAB Ect Mult Living                  Review of Systems  Constitutional: Negative for fever.  Respiratory: Negative for shortness of breath.   Cardiovascular: Negative for chest pain.  Gastrointestinal: Positive for nausea, abdominal pain and diarrhea. Negative for vomiting.  Genitourinary: Positive for frequency. Negative for dysuria.  All other systems reviewed and are negative.    Allergies  Reglan; Aspirin; and Bee venom  Home Medications   Current Outpatient Rx  Name  Route  Sig  Dispense  Refill  . DIAZEPAM 10 MG PO TABS    Oral   Take 10 mg by mouth every 6 (six) hours as needed. For muscle spasms           BP 117/73  Pulse 71  Temp 98.7 F (37.1 C) (Oral)  Resp 20  SpO2 99%  Physical Exam  Nursing note and vitals reviewed. Constitutional: She is oriented to person, place, and time. She appears well-developed and well-nourished. No distress.  HENT:  Head: Normocephalic.  Mouth/Throat: Oropharynx is clear and moist.  Eyes: Conjunctivae normal and EOM are normal.  Cardiovascular: Normal rate and intact distal pulses.   Pulmonary/Chest: Effort normal and breath sounds normal. No stridor. No respiratory distress. She has no wheezes. She has no rales. She exhibits no tenderness.  Abdominal: Soft. She exhibits no distension and no mass. There is tenderness. There is no rebound and no guarding.       Mild tenderness to deep palpation of the epigastrium.  Musculoskeletal: Normal range of motion.  Neurological: She is alert and oriented to person, place, and time.  Psychiatric: She has a normal mood and affect.    ED Course  Procedures (including critical care time)  Labs Reviewed  GLUCOSE, CAPILLARY - Abnormal; Notable for the following:  Glucose-Capillary 115 (*)     All other components within normal limits  CBC WITH DIFFERENTIAL - Abnormal; Notable for the following:    WBC 12.3 (*)     HCT 35.4 (*)     Neutro Abs 8.3 (*)     All other components within normal limits  COMPREHENSIVE METABOLIC PANEL  LIPASE, BLOOD  URINALYSIS, ROUTINE W REFLEX MICROSCOPIC   No results found.   1. Diarrhea       MDM  On reexamination patient is significantly improved. Pain is minimal at 4/10. Abdominal exam remains benign with no peritoneal signs. Patient is amenable to discharge. Return precautions will be given.  New Prescriptions   LOPERAMIDE (IMODIUM) 2 MG CAPSULE    Take 1 capsule (2 mg total) by mouth 4 (four) times daily as needed for diarrhea or loose stools.   OXYCODONE-ACETAMINOPHEN  (PERCOCET/ROXICET) 5-325 MG PER TABLET    1 to 2 tabs PO q6hrs  PRN for pain          Wynetta Emery, PA-C 02/21/12 2006

## 2012-02-21 NOTE — ED Notes (Signed)
Pt has had diarrhea for 2 days with some nausea but no vomiting. She says everytime she drinks fluids she has diarrhea.  No fevers.  Pt is also c/o abd pain right around the belly button.  She also reports some swelling to the left knee and some pain.  She says she has pain for 2 weeks.  She does a lot of walking at work.

## 2012-04-07 ENCOUNTER — Emergency Department (HOSPITAL_COMMUNITY)
Admission: EM | Admit: 2012-04-07 | Discharge: 2012-04-07 | Disposition: A | Discharge status: Home / Self Care | Payer: Medicaid Other | Attending: Emergency Medicine | Admitting: Emergency Medicine

## 2012-04-07 ENCOUNTER — Encounter (HOSPITAL_COMMUNITY): Payer: Self-pay | Admitting: *Deleted

## 2012-04-07 ENCOUNTER — Emergency Department (HOSPITAL_COMMUNITY): Payer: Medicaid Other

## 2012-04-07 DIAGNOSIS — F329 Major depressive disorder, single episode, unspecified: Secondary | ICD-10-CM | POA: Insufficient documentation

## 2012-04-07 DIAGNOSIS — N390 Urinary tract infection, site not specified: Secondary | ICD-10-CM | POA: Insufficient documentation

## 2012-04-07 DIAGNOSIS — N760 Acute vaginitis: Secondary | ICD-10-CM | POA: Insufficient documentation

## 2012-04-07 DIAGNOSIS — R011 Cardiac murmur, unspecified: Secondary | ICD-10-CM | POA: Insufficient documentation

## 2012-04-07 DIAGNOSIS — R0789 Other chest pain: Secondary | ICD-10-CM

## 2012-04-07 DIAGNOSIS — M129 Arthropathy, unspecified: Secondary | ICD-10-CM | POA: Insufficient documentation

## 2012-04-07 DIAGNOSIS — R071 Chest pain on breathing: Secondary | ICD-10-CM | POA: Insufficient documentation

## 2012-04-07 DIAGNOSIS — N649 Disorder of breast, unspecified: Secondary | ICD-10-CM | POA: Insufficient documentation

## 2012-04-07 DIAGNOSIS — Z3202 Encounter for pregnancy test, result negative: Secondary | ICD-10-CM | POA: Insufficient documentation

## 2012-04-07 DIAGNOSIS — Z87891 Personal history of nicotine dependence: Secondary | ICD-10-CM | POA: Insufficient documentation

## 2012-04-07 DIAGNOSIS — G8929 Other chronic pain: Secondary | ICD-10-CM | POA: Insufficient documentation

## 2012-04-07 DIAGNOSIS — F3289 Other specified depressive episodes: Secondary | ICD-10-CM | POA: Insufficient documentation

## 2012-04-07 DIAGNOSIS — J45909 Unspecified asthma, uncomplicated: Secondary | ICD-10-CM | POA: Insufficient documentation

## 2012-04-07 LAB — URINALYSIS, ROUTINE W REFLEX MICROSCOPIC
Glucose, UA: NEGATIVE mg/dL
Ketones, ur: NEGATIVE mg/dL
Protein, ur: NEGATIVE mg/dL
Specific Gravity, Urine: 1.017 (ref 1.005–1.030)
pH: 6.5 (ref 5.0–8.0)

## 2012-04-07 LAB — URINE MICROSCOPIC-ADD ON

## 2012-04-07 LAB — POCT PREGNANCY, URINE: Preg Test, Ur: NEGATIVE

## 2012-04-07 MED ORDER — METRONIDAZOLE 500 MG PO TABS: 500.0000 mg | ORAL_TABLET | Freq: Two times a day (BID) | ORAL | Status: DC

## 2012-04-07 NOTE — ED Notes (Signed)
States that her chest pain has been hurting since 12/21. Describes it as sharp and takes her breath away. States that she has been picking up her child more than usual but pain was in her chest before the increase in picking up her child. States that she knew she has been using OTC monistat for a yeast infection. States that she doesn't think she has a UTI. States that she was sent to a doctor to r/o breast cancer May 2012 and test were negative.

## 2012-04-07 NOTE — ED Provider Notes (Signed)
History     CSN: [id number]  Arrival date & time 04/07/12  1025   First MD Initiated Contact with Patient 04/07/12 1115      Chief Complaint  Patient presents with  . Chest Pain  . Urinary Tract Infection  . Breast Problem    (Consider location/radiation/quality/duration/timing/severity/associated sxs/prior treatment) HPI Comments: Patient presents with a chief complaint of chest pain.  She reports that the pain has been present for the past 2 weeks.  Pain is constant.  Pain located over the sternum.  Pain worse with palpation.  Pain does not become worse with deep breaths.  She denies SOB.  She has had an occasional cough over the past couple of months.  She denies nausea or vomiting.  Denies fever or chills.  Denies dizziness, lightheadedness, or syncope.  Patient denies any history of PE or DVT.  Denies any cardiac history.  No history of LE swelling or pain.  She is not on any estrogen containing medications.  She reports that she did drive to [place] two weeks ago.  No surgeries in the past 4 weeks.    She has also been complaining of her nipples feeling dry and reports that she noticed a small amount of blood one day coming from her nipple.  She denies any breast masses.  No erythema or warmth of the breast.  She has also noticed some vaginal discharge over the past week.  She reports that the discharge is white in color.  She is concerned that she may have a yeast infection.  She denies vaginal itching or external irritation.  She denies dysuria, increased urinary frequency, or urgency.    Patient is a 27 y.o. female presenting with chest pain. The history is provided by the patient.  Chest Pain Pertinent negatives for primary symptoms include no shortness of breath, no wheezing and no palpitations.     Past Medical History  Diagnosis Date  . Asthma     albuterol inhaler  . Blood transfusion     2008 with c/s 4 units transfused  . Headache     otc ibuprofen 800mg   . Arthritis      in spine and slip disk in lower back - tx w/otc med  . Depression   . Chronic back pain     Past Surgical History  Procedure Date  . Appendectomy   . Cesarean section   . Cholecystectomy   . Laparoscopy   . Hand surgery   . Tonsillectomy     T & A  . Tubal ligation   . Endometrial ablation     No family history on file.  History  Substance Use Topics  . Smoking status: Former Smoker -- 0.5 packs/day    Types: Cigarettes    Quit date: 01/04/2012  . Smokeless tobacco: Never Used  . Alcohol Use: 0.6 oz/week    1 Glasses of wine per week     Comment: ocassionally    OB History    Grav Para Term Preterm Abortions TAB SAB Ect Mult Living                  Review of Systems  Respiratory: Negative for shortness of breath and wheezing.   Cardiovascular: Negative for palpitations and leg swelling.  Genitourinary: Positive for vaginal discharge.  Skin: Negative for color change.  All other systems reviewed and are negative.    Allergies  Reglan; Aspirin; and Bee venom  Home Medications   Current Outpatient Rx  Name  Route  Sig  Dispense  Refill  . DIAZEPAM 10 MG PO TABS   Oral   Take 10 mg by mouth every 6 (six) hours as needed. For muscle spasms         . MICONAZOLE NITRATE VA KIT   Vaginal   Place 1 each vaginally at bedtime.         . OXYCODONE-ACETAMINOPHEN 5-325 MG PO TABS      1 to 2 tabs PO q6hrs  PRN for pain   15 tablet   0     BP 123/76  Pulse 113  Temp 98.1 F (36.7 C) (Oral)  Resp 16  SpO2 100%  Physical Exam  Nursing note and vitals reviewed. Constitutional: She appears well-developed and well-nourished. No distress.  HENT:  Head: Normocephalic and atraumatic.  Mouth/Throat: Oropharynx is clear and moist.  Neck: Normal range of motion. Neck supple.  Cardiovascular: Normal rate, regular rhythm and intact distal pulses.   Murmur heard. Pulses:      Dorsalis pedis pulses are 2+ on the right side, and 2+ on the left side.    Pulmonary/Chest: Effort normal and breath sounds normal. No respiratory distress. She has no wheezes. She has no rales. She exhibits tenderness.       Tenderness to palpation of the sternum and the left anterior chest  Abdominal: Soft. Bowel sounds are normal. She exhibits no distension and no mass. There is no tenderness. There is no rebound and no guarding.  Musculoskeletal: Normal range of motion.       No LE edema or erythema Negative Homan's bilaterally  Neurological: She is alert.  Skin: Skin is warm and dry. No rash noted. She is not diaphoretic.  Psychiatric: She has a normal mood and affect.    ED Course  Procedures (including critical care time)  Labs Reviewed  URINALYSIS, ROUTINE W REFLEX MICROSCOPIC - Abnormal; Notable for the following:    APPearance CLOUDY (*)     Hgb urine dipstick SMALL (*)     Leukocytes, UA SMALL (*)     All other components within normal limits  POCT PREGNANCY, URINE  URINE MICROSCOPIC-ADD ON  GC/CHLAMYDIA PROBE AMP  WET PREP, GENITAL  D-DIMER, QUANTITATIVE   Dg Chest 2 View  04/07/2012  *RADIOLOGY REPORT*  Clinical Data: Chest pain.  CHEST - 2 VIEW  Comparison: January 18, 2008.  Findings: Cardiomediastinal silhouette appears normal.  No acute pulmonary disease is noted.  Bony thorax is intact.  IMPRESSION: No acute cardiopulmonary abnormality seen.   Original Report Authenticated By: Lupita Raider.,  M.D.      No diagnosis found.   Date: 04/07/2012  Rate: 66  Rhythm: normal sinus rhythm  QRS Axis: normal  Intervals: normal  ST/T Wave abnormalities: normal  Conduction Disutrbances:none  Narrative Interpretation:   Old EKG Reviewed: none available    MDM  Patient is to be discharged with recommendation to follow up with PCP in regards to today's hospital visit. Chest pain is not likely of cardiac or pulmonary etiology d/t presentation.  Chest tender to palpation and has been present for 2 weeks constantly.   D-dimer negative.  VSS.    No JVD or new murmur, heart RRR, breath sounds equal bilaterally, EKG without acute abnormalities, negative CXR. Pain appears to be chest wall pain.  Patient discharged home.  Return precautions discussed.   Pt appears reliable for follow up and is agreeable to discharge.   Case has been discussed with and  seen by Dr. Adriana Simas who agrees with the above plan to discharge.         Pascal Lux [place], PA-C 04/09/12 1202

## 2012-04-07 NOTE — ED Notes (Signed)
Pt reports center chest pain since Dec 21. Describes as sharp, that "freezes her in her tracks." Also reports nipples cracking, dry, bleeding. C/o UTI, "i know that's what it is but I don't want to have to get undressed, I'm already going to have to expose my chest." But sts she does want to be treated for uti.

## 2012-04-07 NOTE — ED Notes (Signed)
Voiced understanding of instructions given 

## 2012-04-07 NOTE — ED Notes (Signed)
No redness noted to breast. Pt states that it is better than it has been. Denies using any lotions or creams prior to arrival

## 2012-04-09 LAB — GC/CHLAMYDIA PROBE AMP: CT Probe RNA: NEGATIVE

## 2012-04-10 NOTE — ED Notes (Signed)
+  Gonorrhea. Chart sent to EDP office for review. DHHS attached. °

## 2012-04-12 ENCOUNTER — Telehealth (HOSPITAL_COMMUNITY): Payer: Self-pay | Admitting: Emergency Medicine

## 2012-04-12 NOTE — ED Provider Notes (Signed)
Medical screening examination/treatment/procedure(s) were conducted as a shared visit with non-physician practitioner(s) and myself.  I personally evaluated the patient during the encounter. Probability for acute coronary syndrome or pulmonary embolus low  Donnetta Hutching, MD 04/12/12 [phone]

## 2012-04-12 NOTE — ED Notes (Signed)
Chart returned from EDP office. Prescribed Cefixime 400 mg PO x 1. #1. No refills. Prescribed by Dahlia Client Muthersbaugh PA-C.

## 2012-04-13 NOTE — ED Notes (Signed)
Unable to contact patient via phone. Sent letter. °

## 2012-04-15 NOTE — ED Notes (Signed)
Prescription called in to [facility] at [phone] for cefixime 400mg  po x1 dose, no refills per [employer], pa-c.

## 2012-05-14 ENCOUNTER — Telehealth: Payer: Self-pay | Admitting: Obstetrics and Gynecology

## 2012-05-14 ENCOUNTER — Encounter: Payer: Self-pay | Admitting: Family Medicine

## 2012-05-14 ENCOUNTER — Ambulatory Visit: Payer: Medicaid Other | Admitting: Family Medicine

## 2012-05-14 VITALS — BP 108/60 | Temp 98.6°F | Wt 189.0 lb

## 2012-05-14 DIAGNOSIS — Z202 Contact with and (suspected) exposure to infections with a predominantly sexual mode of transmission: Secondary | ICD-10-CM

## 2012-05-14 DIAGNOSIS — N926 Irregular menstruation, unspecified: Secondary | ICD-10-CM

## 2012-05-14 LAB — CBC
HCT: 35.1 % — ABNORMAL LOW (ref 36.0–46.0)
Hemoglobin: 11.5 g/dL — ABNORMAL LOW (ref 12.0–15.0)
MCH: 27.4 pg (ref 26.0–34.0)
MCHC: 32.8 g/dL (ref 30.0–36.0)
MCV: 83.8 fL (ref 78.0–100.0)
Platelets: 364 K/uL (ref 150–400)
RBC: 4.19 MIL/uL (ref 3.87–5.11)
RDW: 13.9 % (ref 11.5–15.5)
WBC: 10.6 10*3/uL — ABNORMAL HIGH (ref 4.0–10.5)

## 2012-05-14 LAB — PROLACTIN: Prolactin: 10.9 ng/mL

## 2012-05-14 LAB — TSH: TSH: 1.878 u[IU]/mL (ref 0.350–4.500)

## 2012-05-14 LAB — POCT URINE PREGNANCY: Preg Test, Ur: NEGATIVE

## 2012-05-14 NOTE — Patient Instructions (Signed)
Metronidazole extended-release tablets What is this medicine? METRONIDAZOLE (me troe NI da zole) is an antiinfective. It is used to treat certain kinds of bacterial and protozoal infections. It will not work for colds, flu, or other viral infections. This medicine may be used for other purposes; ask your health care provider or pharmacist if you have questions. What should I tell my health care provider before I take this medicine? They need to know if you have any of these conditions: -anemia or other blood disorders -disease of the nervous system -fungal or yeast infection -if you drink alcohol containing drinks -liver disease -seizures -an unusual or allergic reaction to metronidazole, or other medicines, foods, dyes, or preservatives -pregnant or trying to get pregnant -breast-feeding How should I use this medicine? Take this medicine by mouth with a full glass of water. Follow the directions on the prescription label. Do not crush or chew. Take this medicine on an empty stomach 1 hour before or 2 hours after meals or food. Take your medicine at regular intervals. Do not take your medicine more often than directed. Take all of your medicine as directed even if you think you are better. Do not skip doses or stop your medicine early. Talk to your pediatrician regarding the use of this medicine in children. Special care may be needed. Overdosage: If you think you have taken too much of this medicine contact a poison control center or emergency room at once. NOTE: This medicine is only for you. Do not share this medicine with others. What if I miss a dose? If you miss a dose, take it as soon as you can. If it is almost time for your next dose, take only that dose. Do not take double or extra doses. What may interact with this medicine? Do not take this medicine with any of the following medications: -alcohol or any product that contains alcohol -amprenavir oral  solution -disulfiram -paclitaxel injection -ritonavir oral solution -sertraline oral solution -sulfamethoxazole-trimethoprim injection This medicine may also interact with the following medications: -cimetidine -lithium -phenobarbital -phenytoin -warfarin This list may not describe all possible interactions. Give your health care provider a list of all the medicines, herbs, non-prescription drugs, or dietary supplements you use. Also tell them if you smoke, drink alcohol, or use illegal drugs. Some items may interact with your medicine. What should I watch for while using this medicine? Tell your doctor or health care professional if your symptoms do not improve or if they get worse. You may get drowsy or dizzy. Do not drive, use machinery, or do anything that needs mental alertness until you know how this medicine affects you. Do not stand or sit up quickly, especially if you are an older patient. This reduces the risk of dizzy or fainting spells. Avoid alcoholic drinks while you are taking this medicine and for three days afterward. Alcohol may make you feel dizzy, sick, or flushed. If you are being treated for a sexually transmitted disease, avoid sexual contact until you have finished your treatment. Your sexual partner may also need treatment. What side effects may I notice from receiving this medicine? Side effects that you should report to your doctor or health care professional as soon as possible: -allergic reactions like skin rash, itching or hives, swelling of the face, lips, or tongue -confusion, clumsiness -difficulty speaking -discolored or sore mouth -dizziness -fever, infection -numbness, tingling, pain or weakness in the hands or feet -trouble passing urine or change in the amount of urine -redness, blistering,  peeling or loosening of the skin, including inside the mouth -seizures -unusually weak or tired -vaginal irritation, dryness, or discharge Side effects that  usually do not require medical attention (report to your doctor or health care professional if they continue or are bothersome): -diarrhea -headache -irritability -metallic taste -nausea -stomach pain or cramps -trouble sleeping This list may not describe all possible side effects. Call your doctor for medical advice about side effects. You may report side effects to FDA at 1-800-FDA-1088. Where should I keep my medicine? Keep out of the reach of children. Store at room temperature between 15 and 30 degrees C (59 and 86 degrees F). Protect from light and moisture. Keep container tightly closed. Throw away any unused medicine after the expiration date. NOTE: This sheet is a summary. It may not cover all possible information. If you have questions about this medicine, talk to your doctor, pharmacist, or health care provider.  2013, [employer]. (01/05/2008 10:45:34 AM)  No Alcohol or Intercourse for 7-10 days.

## 2012-05-14 NOTE — Progress Notes (Signed)
  Current contraception: tubal ligation. Hormone replacement therapy: No New medication: No  History of [id number] (04/11/2012 )  History of infertility: no. History of abnormal Pap smear: no History of fibroids: No  Increased stress: Yes   Abnormal bleeding pattern started this am with a gush of blood

## 2012-05-14 NOTE — Progress Notes (Signed)
Subjective:     Patient ID: Yvonne [name], female   DOB: Dec 28, 1985, 28 y.o.   MRN: [id number] Dark red vaginal bleeding started this morning as a trickle then gush x 1.  Since then has had moderated amount of dark red bleeding without clots.  Localized uterine cramping, rated 5/10 on pain scale.  Relieved with 1 tablet of hydrocodone from outside provider.  She recently started back smoking due to stress.  She was treated for CT 1 week ago.  HPI  BP 108/60  Temp(Src) 98.6 F (37 C)  Wt 189 lb (85.73 kg)  BMI 36.91 kg/m2   Review of Systems  Constitutional: Positive for unexpected weight change. Negative for fever, chills, activity change, appetite change and fatigue.  HENT: Positive for neck pain.   Gastrointestinal: Negative for nausea, vomiting, abdominal pain, diarrhea, constipation and anal bleeding.  Endocrine: Positive for cold intolerance. Negative for heat intolerance, polydipsia, polyphagia and polyuria.  Genitourinary: Positive for vaginal bleeding. Negative for dysuria, hematuria, vaginal discharge, difficulty urinating, vaginal pain, pelvic pain and dyspareunia.  Skin: Negative for color change.  Neurological: Negative for dizziness.  Hematological: Does not bruise/bleed easily.   Patient reports weight loss unintentional of 10 lbs.  Neck pain off and on for months without injury or surgery's, no hoarness.  Denies history of thyroid, diabetes, or pitituary disorders.  Denies taking vitamin D as directed.     Objective:   Physical Exam  Constitutional: She appears well-developed and well-nourished.  Neck: No tracheal deviation present. Thyromegaly present.  Cardiovascular: Normal rate, regular rhythm and normal heart sounds.   Pulmonary/Chest: Effort normal and breath sounds normal.  Abdominal: Soft. She exhibits no distension and no mass. There is no tenderness. There is no rebound and no guarding.  Genitourinary: Vagina normal and uterus normal. There is no rash,  tenderness or lesion on the right labia. There is no rash, tenderness or lesion on the left labia.  Lymphadenopathy:    She has no cervical adenopathy.       Right: No inguinal and no supraclavicular adenopathy present.       Left: No inguinal and no supraclavicular adenopathy present.  Neurological: She is alert.  Skin: Skin is warm.    Thyroid without nodules.  Visual fullness noted bilateral in neck area.  Noticeable amount of dark red vaginal bleeding noted on exam.       Assessment:    1. Irregular Menses s/p ablation (2012).   2. [place] Negative for trichomoniasis, many clues   3. UPT negative      Plan:    1. Will order CBC, TSH, Pelvic U/S, GC/CL, Vitamin D, and prolactin   2. BV: Flagyl 500 mg PO BID x 7 days.   3. Complete Pelvic Ultrasound (nonob), ASAP.    4.TOC: CT

## 2012-05-14 NOTE — Telephone Encounter (Signed)
TC from pt complaining of onset of vaginal bleeding after no menses for 3 years since endometrial ablation was done. Will arrange for same day appt for pt.

## 2012-05-15 ENCOUNTER — Telehealth: Payer: Self-pay | Admitting: Family Medicine

## 2012-05-15 LAB — VITAMIN D 25 HYDROXY (VIT D DEFICIENCY, FRACTURES): Vit D, 25-Hydroxy: <10 ng/mL — ABNORMAL LOW (ref 30–89)

## 2012-05-15 NOTE — Telephone Encounter (Signed)
Spoke with pt and notified that I called the flagyl in as ordered earlier.   Also results given for vitamin D level and Rx called in for cholecalciferol 50,0000 units By mouth once a week for 12 weeks then  calcium 1,000 mg daily  Discussed diet and need for milk, dairy product, and green vegetables.  Pt voiced understanding.

## 2012-05-16 LAB — GC/CHLAMYDIA PROBE AMP
CT Probe RNA: NEGATIVE
GC Probe RNA: NEGATIVE

## 2012-06-03 ENCOUNTER — Encounter: Payer: Self-pay | Admitting: Obstetrics and Gynecology

## 2012-06-03 ENCOUNTER — Ambulatory Visit: Payer: Medicaid Other

## 2012-06-03 ENCOUNTER — Ambulatory Visit: Payer: Medicaid Other | Admitting: Obstetrics and Gynecology

## 2012-06-03 VITALS — BP 100/58 | Resp 14 | Wt 190.0 lb

## 2012-06-03 DIAGNOSIS — N926 Irregular menstruation, unspecified: Secondary | ICD-10-CM

## 2012-06-03 DIAGNOSIS — Z302 Encounter for sterilization: Secondary | ICD-10-CM | POA: Insufficient documentation

## 2012-06-03 LAB — POCT URINALYSIS DIPSTICK
Bilirubin, UA: NEGATIVE
Glucose, UA: NEGATIVE
Leukocytes, UA: NEGATIVE
Nitrite, UA: NEGATIVE
Urobilinogen, UA: NEGATIVE
pH, UA: 7

## 2012-06-03 NOTE — Addendum Note (Signed)
Addended by: Tim Lair on: 06/03/2012 04:06 PM   Modules accepted: Orders

## 2012-06-03 NOTE — Progress Notes (Signed)
Here for u/s and f/u secondary to episode of bleeding in Feb.  H/o HTA and BTL. Reports urinary frequency (as soon as she drinks she has to void)  Filed Vitals:   06/03/12 1507  BP: 100/58  Resp: 14   U/S - Ut 6.2x4.4x4.7, nl bilateral ovaries (rt 2.4cm and lt 3.0cm)  A/P UCx If bleeding persists, consider embx  And other tx options

## 2012-06-09 ENCOUNTER — Telehealth: Payer: Self-pay | Admitting: Obstetrics and Gynecology

## 2012-06-10 ENCOUNTER — Encounter: Payer: Self-pay | Admitting: Family Medicine

## 2012-06-10 ENCOUNTER — Ambulatory Visit: Payer: Medicaid Other | Admitting: Family Medicine

## 2012-06-10 ENCOUNTER — Telehealth: Payer: Self-pay | Admitting: Certified Nurse Midwife

## 2012-06-10 VITALS — BP 100/62 | Ht 60.0 in | Wt 193.0 lb

## 2012-06-10 MED ORDER — TRAMADOL HCL 50 MG PO TABS: 50.0000 mg | ORAL_TABLET | Freq: Four times a day (QID) | ORAL | Status: DC | PRN

## 2012-06-10 NOTE — Progress Notes (Signed)
BP 100/62  Ht 5' (1.524 m)  Wt 193 lb (87.544 kg)  BMI 37.69 kg/m2  S:  Patient started bleeding on 3/7 and stopped 3/10.  Moderate amount with no clots with severe pelvic pain. Tried Advil (3 tablets, twice a day) with minimal relief.  Still in pain today even though bleeding stopped.  Changed pad frequently and use 2 packs of Always overnight.  Denies BV, SOB, dizziness, or fainting.    O: Pt sitting in bedside chair in mild distress, but nontoxic.  Pt placed in lithotomy position.  EGUSB: WNL.  Bimanual exam reveals antevertered uterus. Cervix midline, parous and pink without discharge, lesions or tenderness.  Uterus: appropriate size, shape, anteverted and good mobility without tenderness.  Adnexal region without masses or tenderness.      Cervix cleaned with betadine.  Hurricane gel applied to anterior cervix. Tenaculum. Endometrial sample collected without difficulty @ 10cm, under sterile and clean technique.  Patient tolerated procedure well. No bleeding noted.  Dr. Pennie Rushing at bedside to assist.     A: S/P Ablation Bleeding/Pain  P: Endometrial Biopsy obtained       Ultram 50 mg q 6 hours for pain.  #30 without refills.        F/u with Dr. Su Hilt to discuss further treatment.   Yvonne Jude, FNP-BC

## 2012-06-10 NOTE — Telephone Encounter (Signed)
TC from pt seen by Alessandra Grout for dysfunctional bleeding and pain today.  Expected Rx to be called in to pharmacy, not there yet.  I called Isley Zinni to clarify order.  Rx Ultram 50mg  1 PO Q6hr PRN pain #30 no refills  TC to patient to advise I have sent the rx to her pharmacy

## 2012-06-12 LAB — PATHOLOGY

## 2012-08-03 ENCOUNTER — Emergency Department (HOSPITAL_COMMUNITY): Payer: Medicaid Other

## 2012-08-03 ENCOUNTER — Emergency Department (HOSPITAL_COMMUNITY)
Admission: EM | Admit: 2012-08-03 | Discharge: 2012-08-03 | Disposition: A | Discharge status: Home / Self Care | Payer: Medicaid Other | Attending: Emergency Medicine | Admitting: Emergency Medicine

## 2012-08-03 ENCOUNTER — Encounter (HOSPITAL_COMMUNITY): Payer: Self-pay

## 2012-08-03 DIAGNOSIS — R071 Chest pain on breathing: Secondary | ICD-10-CM | POA: Insufficient documentation

## 2012-08-03 DIAGNOSIS — J45909 Unspecified asthma, uncomplicated: Secondary | ICD-10-CM

## 2012-08-03 DIAGNOSIS — G8929 Other chronic pain: Secondary | ICD-10-CM | POA: Insufficient documentation

## 2012-08-03 DIAGNOSIS — Z8659 Personal history of other mental and behavioral disorders: Secondary | ICD-10-CM | POA: Insufficient documentation

## 2012-08-03 DIAGNOSIS — Z79899 Other long term (current) drug therapy: Secondary | ICD-10-CM | POA: Insufficient documentation

## 2012-08-03 DIAGNOSIS — Z8739 Personal history of other diseases of the musculoskeletal system and connective tissue: Secondary | ICD-10-CM | POA: Insufficient documentation

## 2012-08-03 DIAGNOSIS — IMO0002 Reserved for concepts with insufficient information to code with codable children: Secondary | ICD-10-CM | POA: Insufficient documentation

## 2012-08-03 DIAGNOSIS — J45901 Unspecified asthma with (acute) exacerbation: Secondary | ICD-10-CM | POA: Insufficient documentation

## 2012-08-03 DIAGNOSIS — R0789 Other chest pain: Secondary | ICD-10-CM

## 2012-08-03 DIAGNOSIS — Z87891 Personal history of nicotine dependence: Secondary | ICD-10-CM | POA: Insufficient documentation

## 2012-08-03 MED ORDER — ALBUTEROL SULFATE HFA 108 (90 BASE) MCG/ACT IN AERS
2.0000 | INHALATION_SPRAY | Freq: Once | RESPIRATORY_TRACT | Status: AC
  Administered 2012-08-03: 2 via RESPIRATORY_TRACT
  Filled 2012-08-03: qty 6.7

## 2012-08-03 MED ORDER — TRAMADOL HCL 50 MG PO TABS: 50.0000 mg | ORAL_TABLET | Freq: Four times a day (QID) | ORAL | Status: DC | PRN

## 2012-08-03 MED ORDER — IBUPROFEN 600 MG PO TABS: 600.0000 mg | ORAL_TABLET | Freq: Three times a day (TID) | ORAL | Status: DC

## 2012-08-03 MED ORDER — PREDNISONE 20 MG PO TABS
60.0000 mg | ORAL_TABLET | Freq: Once | ORAL | Status: AC
  Administered 2012-08-03: 60 mg via ORAL
  Filled 2012-08-03: qty 3

## 2012-08-03 MED ORDER — PREDNISONE 10 MG PO TABS: 20.0000 mg | ORAL_TABLET | Freq: Every day | ORAL | Status: DC

## 2012-08-03 MED ORDER — KETOROLAC TROMETHAMINE 30 MG/ML IJ SOLN
30.0000 mg | Freq: Once | INTRAMUSCULAR | Status: AC
  Administered 2012-08-03: 30 mg via INTRAVENOUS
  Filled 2012-08-03: qty 1

## 2012-08-03 NOTE — ED Notes (Signed)
Patient began having chest and SOB 7 days ago. Worsened this morning. Being outside and bending over makes worse. Has not taken anything for pain. Patient states that this has happened before and was given ibuprofen. Did not take ibuprofen but took mother's hydrocodone. She states that she went to sleep and when she woke up she felt better. Patient also states that she feels dizzy and light headed when she walks

## 2012-08-03 NOTE — ED Provider Notes (Signed)
History     CSN: [id number]  Arrival date & time 08/03/12  1158   First MD Initiated Contact with Patient 08/03/12 1311      Chief Complaint  Patient presents with  . Shortness of Breath  . Chest Pain    (Consider location/radiation/quality/duration/timing/severity/associated sxs/prior treatment) HPI Pt with 1 week of gradual onset chest wall pain, worse with deep breathing and palpation. No trauma, fever, chills. Pt pt has had similar pain before. Pt also admits to increased SOB and wheezing for several weeks. Does not use inh at home. No cough. No lower ext swelling or pain. No recent travel or surgeries.  Past Medical History  Diagnosis Date  . Asthma     albuterol inhaler  . Blood transfusion     2008 with c/s 4 units transfused  . Headache     otc ibuprofen 800mg   . 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 Depression   . Chronic back pain     Past Surgical History  Procedure Laterality Date  . Appendectomy    . Cesarean section    . Cholecystectomy    . Laparoscopy    . Hand surgery    . Tonsillectomy      T & A  . Tubal ligation    . Endometrial ablation      No family history on file.  History  Substance Use Topics  . Smoking status: Former Smoker -- 0.50 packs/day    Types: Cigarettes    Quit date: 01/04/2012  . Smokeless tobacco: Never Used  . Alcohol Use: 0.6 oz/week    1 Glasses of wine per week     Comment: ocassionally    OB History   Grav Para Term Preterm Abortions TAB SAB Ect Mult Living   4 1 1       1       Review of Systems  Constitutional: Negative for fever and chills.  Respiratory: Positive for shortness of breath and wheezing.   Cardiovascular: Positive for chest pain. Negative for palpitations and leg swelling.  Gastrointestinal: Negative for nausea, vomiting and abdominal pain.  Musculoskeletal: Negative for myalgias and back pain.  Skin: Negative for rash and wound.  Neurological: Negative for  dizziness, weakness, light-headedness, numbness and headaches.  All other systems reviewed and are negative.    Allergies  Reglan; Aspirin; and Bee venom  Home Medications   Current Outpatient Rx  Name  Route  Sig  Dispense  Refill  . diphenhydrAMINE (BENADRYL) 25 mg capsule   Oral   Take 75 mg by mouth once.         Marland Kitchen ibuprofen (ADVIL,MOTRIN) 800 MG tablet   Oral   Take 800 mg by mouth every 8 (eight) hours as needed for pain.         . metroNIDAZOLE (FLAGYL) 500 MG tablet   Oral   Take 500 mg by mouth 2 (two) times daily.         Marland Kitchen ibuprofen (ADVIL,MOTRIN) 600 MG tablet   Oral   Take 1 tablet (600 mg total) by mouth 3 (three) times daily.   21 tablet   0   . predniSONE (DELTASONE) 10 MG tablet   Oral   Take 2 tablets (20 mg total) by mouth daily.   10 tablet   0   . traMADol (ULTRAM) 50 MG tablet   Oral   Take 1 tablet (50 mg  total) by mouth every 6 (six) hours as needed for pain.   15 tablet   0     BP 107/66  Pulse 64  Temp(Src) 98.4 F (36.9 C) (Oral)  Resp 15  SpO2 100%  LMP 07/25/2012  Physical Exam  Nursing note and vitals reviewed. Constitutional: She is oriented to person, place, and time. She appears well-developed and well-nourished. No distress.  HENT:  Head: Normocephalic and atraumatic.  Mouth/Throat: Oropharynx is clear and moist.  Eyes: EOM are normal. Pupils are equal, round, and reactive to light.  Neck: Normal range of motion. Neck supple.  Cardiovascular: Normal rate and regular rhythm.   Pulmonary/Chest: Effort normal. No respiratory distress. She has wheezes (scattered mild exp wheezes). She has no rales. She exhibits tenderness (Chest pain reproduced completely with palption over sternum and costal cartilage. No deformity or crepitance ).  Abdominal: Soft. Bowel sounds are normal. She exhibits no distension and no mass. There is no tenderness. There is no rebound and no guarding.  Musculoskeletal: Normal range of motion. She  exhibits no edema and no tenderness.  No calf swelling or pain.   Neurological: She is alert and oriented to person, place, and time.  Skin: Skin is warm and dry. No rash noted. No erythema.  Psychiatric: She has a normal mood and affect. Her behavior is normal.    ED Course  Procedures (including critical care time)  Labs Reviewed - No data to display Dg Chest 2 View  08/03/2012  *RADIOLOGY REPORT*  Clinical Data: Shortness of breath  CHEST - 2 VIEW  Comparison:  04/07/2012  Findings: Cardiomediastinal silhouette is stable.  No acute infiltrate or pleural effusion.  No pulmonary edema.  Mild degenerative changes thoracic spine.  IMPRESSION: No active disease.  No significant change.   Original Report Authenticated By: Natasha Mead, M.D.      1. Chest wall pain   2. Asthma      Date: 08/03/2012  Rate:59  Rhythm: normal sinus rhythm  QRS Axis: normal  Intervals: normal  ST/T Wave abnormalities: normal  Conduction Disutrbances:none  Narrative Interpretation:   Old EKG Reviewed: unchanged   MDM  Chest wall pain by exam. Will treat as costochondritis. Albuterol inhaler given in ED.         Loren Racer, MD 08/03/12 [id number]

## 2012-09-08 ENCOUNTER — Other Ambulatory Visit: Payer: Self-pay | Admitting: Obstetrics and Gynecology

## 2012-09-19 ENCOUNTER — Other Ambulatory Visit (HOSPITAL_COMMUNITY): Payer: Self-pay | Admitting: Obstetrics and Gynecology
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
## 2012-09-26 ENCOUNTER — Inpatient Hospital Stay (HOSPITAL_COMMUNITY): Admission: RE | Admit: 2012-09-26 | Payer: Medicaid Other | Source: Ambulatory Visit

## 2012-09-28 MED ORDER — DEXTROSE 5 % IV SOLN
2.0000 g | INTRAVENOUS | Status: AC
  Administered 2012-09-29: 2 g via INTRAVENOUS
  Filled 2012-09-28: qty 2

## 2012-09-29 ENCOUNTER — Encounter (HOSPITAL_COMMUNITY): Payer: Self-pay | Admitting: *Deleted

## 2012-09-29 ENCOUNTER — Encounter (HOSPITAL_COMMUNITY): Payer: Self-pay | Admitting: Pharmacy Technician

## 2012-09-29 ENCOUNTER — Ambulatory Visit (HOSPITAL_COMMUNITY): Payer: Medicaid Other | Admitting: Anesthesiology

## 2012-09-29 ENCOUNTER — Ambulatory Visit (HOSPITAL_COMMUNITY)
Admission: RE | Admit: 2012-09-29 | Discharge: 2012-09-30 | Disposition: A | Discharge status: Home / Self Care | Payer: Medicaid Other | Source: Ambulatory Visit | Attending: Obstetrics and Gynecology | Admitting: Obstetrics and Gynecology

## 2012-09-29 ENCOUNTER — Encounter (HOSPITAL_COMMUNITY): Payer: Self-pay | Admitting: Anesthesiology

## 2012-09-29 ENCOUNTER — Encounter (HOSPITAL_COMMUNITY)
Admission: RE | Disposition: A | Discharge status: Home / Self Care | Payer: Self-pay | Source: Ambulatory Visit | Attending: Obstetrics and Gynecology

## 2012-09-29 DIAGNOSIS — N7093 Salpingitis and oophoritis, unspecified: Secondary | ICD-10-CM | POA: Insufficient documentation

## 2012-09-29 DIAGNOSIS — M545 Low back pain, unspecified: Secondary | ICD-10-CM | POA: Insufficient documentation

## 2012-09-29 DIAGNOSIS — N938 Other specified abnormal uterine and vaginal bleeding: Secondary | ICD-10-CM | POA: Insufficient documentation

## 2012-09-29 DIAGNOSIS — N8000 Endometriosis of the uterus, unspecified: Secondary | ICD-10-CM | POA: Insufficient documentation

## 2012-09-29 DIAGNOSIS — N949 Unspecified condition associated with female genital organs and menstrual cycle: Secondary | ICD-10-CM | POA: Insufficient documentation

## 2012-09-29 HISTORY — PX: ABDOMINAL HYSTERECTOMY: SHX81

## 2012-09-29 HISTORY — PX: CYSTOSCOPY: SHX5120

## 2012-09-29 HISTORY — PX: LAPAROSCOPIC ASSISTED VAGINAL HYSTERECTOMY: SHX5398

## 2012-09-29 HISTORY — PX: BILATERAL SALPINGECTOMY: SHX5743

## 2012-09-29 HISTORY — PX: LAPAROSCOPIC HYSTERECTOMY: SHX1926

## 2012-09-29 LAB — CBC
HCT: 36.8 % (ref 36.0–46.0)
MCH: 28.4 pg (ref 26.0–34.0)
MCHC: 33.7 g/dL (ref 30.0–36.0)
MCV: 84.4 fL (ref 78.0–100.0)
Platelets: 238 10*3/uL (ref 150–400)
RDW: 12.9 % (ref 11.5–15.5)
WBC: 10 10*3/uL (ref 4.0–10.5)

## 2012-09-29 SURGERY — HYSTERECTOMY, TOTAL, LAPAROSCOPIC
Anesthesia: General | Site: Bladder | Wound class: Clean Contaminated

## 2012-09-29 MED ORDER — MIDAZOLAM HCL 5 MG/5ML IJ SOLN
INTRAMUSCULAR | Status: DC | PRN
  Administered 2012-09-29: 2 mg via INTRAVENOUS

## 2012-09-29 MED ORDER — FENTANYL CITRATE 0.05 MG/ML IJ SOLN
INTRAMUSCULAR | Status: AC
  Filled 2012-09-29: qty 5

## 2012-09-29 MED ORDER — KETOROLAC TROMETHAMINE 30 MG/ML IJ SOLN: 30.0000 mg | Freq: Four times a day (QID) | INTRAMUSCULAR | Status: DC

## 2012-09-29 MED ORDER — LACTATED RINGERS IR SOLN
Status: DC | PRN
  Administered 2012-09-29: 3000 mL

## 2012-09-29 MED ORDER — IBUPROFEN 600 MG PO TABS: 600.0000 mg | ORAL_TABLET | Freq: Four times a day (QID) | ORAL | Status: DC | PRN

## 2012-09-29 MED ORDER — SODIUM CHLORIDE 0.9 % IJ SOLN: 9.0000 mL | INTRAMUSCULAR | Status: DC | PRN

## 2012-09-29 MED ORDER — DIPHENHYDRAMINE HCL 12.5 MG/5ML PO ELIX: 12.5000 mg | ORAL_SOLUTION | Freq: Four times a day (QID) | ORAL | Status: DC | PRN

## 2012-09-29 MED ORDER — PROPOFOL 10 MG/ML IV BOLUS
INTRAVENOUS | Status: DC | PRN
  Administered 2012-09-29: 200 mg via INTRAVENOUS

## 2012-09-29 MED ORDER — MIDAZOLAM HCL 2 MG/2ML IJ SOLN
INTRAMUSCULAR | Status: AC
  Filled 2012-09-29: qty 2

## 2012-09-29 MED ORDER — ONDANSETRON HCL 4 MG/2ML IJ SOLN
INTRAMUSCULAR | Status: AC
  Filled 2012-09-29: qty 2

## 2012-09-29 MED ORDER — ONDANSETRON HCL 4 MG/2ML IJ SOLN
INTRAMUSCULAR | Status: DC | PRN
  Administered 2012-09-29: 4 mg via INTRAVENOUS

## 2012-09-29 MED ORDER — ONDANSETRON HCL 4 MG/2ML IJ SOLN: 4.0000 mg | Freq: Four times a day (QID) | INTRAMUSCULAR | Status: DC | PRN

## 2012-09-29 MED ORDER — ACETAMINOPHEN 160 MG/5ML PO SOLN
975.0000 mg | Freq: Once | ORAL | Status: AC
  Administered 2012-09-29: 975 mg via ORAL

## 2012-09-29 MED ORDER — HYDROMORPHONE HCL PF 1 MG/ML IJ SOLN: 0.2500 mg | INTRAMUSCULAR | Status: DC | PRN

## 2012-09-29 MED ORDER — HYDROMORPHONE 0.3 MG/ML IV SOLN
INTRAVENOUS | Status: DC
Start: 2012-09-29 — End: 2012-09-30
  Administered 2012-09-29: 0.3 mg via INTRAVENOUS
  Administered 2012-09-29: 0.4 mg via INTRAVENOUS
  Administered 2012-09-30 (×2): 0.799 mg via INTRAVENOUS
  Filled 2012-09-29: qty 25

## 2012-09-29 MED ORDER — ROCURONIUM BROMIDE 50 MG/5ML IV SOLN
INTRAVENOUS | Status: AC
  Filled 2012-09-29: qty 1

## 2012-09-29 MED ORDER — HYDROMORPHONE HCL PF 1 MG/ML IJ SOLN
INTRAMUSCULAR | Status: AC
  Filled 2012-09-29: qty 1

## 2012-09-29 MED ORDER — GLYCOPYRROLATE 0.2 MG/ML IJ SOLN
INTRAMUSCULAR | Status: AC
  Filled 2012-09-29: qty 2

## 2012-09-29 MED ORDER — NEOSTIGMINE METHYLSULFATE 1 MG/ML IJ SOLN
INTRAMUSCULAR | Status: AC
  Filled 2012-09-29: qty 1

## 2012-09-29 MED ORDER — PROPOFOL 10 MG/ML IV EMUL
INTRAVENOUS | Status: AC
  Filled 2012-09-29: qty 20

## 2012-09-29 MED ORDER — KETOROLAC TROMETHAMINE 30 MG/ML IJ SOLN: 15.0000 mg | Freq: Once | INTRAMUSCULAR | Status: DC | PRN

## 2012-09-29 MED ORDER — NEOSTIGMINE METHYLSULFATE 1 MG/ML IJ SOLN
INTRAMUSCULAR | Status: DC | PRN
  Administered 2012-09-29: 3 mg via INTRAVENOUS
  Administered 2012-09-29: 2 mg via INTRAVENOUS

## 2012-09-29 MED ORDER — FENTANYL CITRATE 0.05 MG/ML IJ SOLN
INTRAMUSCULAR | Status: DC | PRN
  Administered 2012-09-29: 50 ug via INTRAVENOUS
  Administered 2012-09-29 (×2): 100 ug via INTRAVENOUS

## 2012-09-29 MED ORDER — DIPHENHYDRAMINE HCL 50 MG/ML IJ SOLN: 12.5000 mg | Freq: Four times a day (QID) | INTRAMUSCULAR | Status: DC | PRN

## 2012-09-29 MED ORDER — ROCURONIUM BROMIDE 100 MG/10ML IV SOLN
INTRAVENOUS | Status: DC | PRN
  Administered 2012-09-29 (×2): 10 mg via INTRAVENOUS
  Administered 2012-09-29: 35 mg via INTRAVENOUS
  Administered 2012-09-29: 20 mg via INTRAVENOUS
  Administered 2012-09-29: 10 mg via INTRAVENOUS
  Administered 2012-09-29: 5 mg via INTRAVENOUS

## 2012-09-29 MED ORDER — INDIGOTINDISULFONATE SODIUM 8 MG/ML IJ SOLN
INTRAMUSCULAR | Status: DC | PRN
  Administered 2012-09-29: 5 mL via INTRAVENOUS

## 2012-09-29 MED ORDER — OXYCODONE-ACETAMINOPHEN 5-325 MG PO TABS
1.0000 | ORAL_TABLET | ORAL | Status: DC | PRN
  Administered 2012-09-30: 1 via ORAL
  Filled 2012-09-29: qty 1

## 2012-09-29 MED ORDER — STERILE WATER FOR IRRIGATION IR SOLN
Status: DC | PRN
  Administered 2012-09-29: 1000 mL

## 2012-09-29 MED ORDER — ACETAMINOPHEN 10 MG/ML IV SOLN
1000.0000 mg | Freq: Four times a day (QID) | INTRAVENOUS | Status: DC
  Administered 2012-09-29: 1000 mg via INTRAVENOUS

## 2012-09-29 MED ORDER — ACETAMINOPHEN 10 MG/ML IV SOLN
INTRAVENOUS | Status: AC
  Filled 2012-09-29: qty 100

## 2012-09-29 MED ORDER — LACTATED RINGERS IV SOLN
INTRAVENOUS | Status: DC
  Administered 2012-09-29 – 2012-09-30 (×2): via INTRAVENOUS

## 2012-09-29 MED ORDER — HYDROMORPHONE HCL PF 1 MG/ML IJ SOLN
INTRAMUSCULAR | Status: DC | PRN
  Administered 2012-09-29: 1 mg via INTRAVENOUS

## 2012-09-29 MED ORDER — LIDOCAINE HCL (CARDIAC) 20 MG/ML IV SOLN
INTRAVENOUS | Status: DC | PRN
  Administered 2012-09-29 (×2): 50 mg via INTRAVENOUS

## 2012-09-29 MED ORDER — DEXAMETHASONE SODIUM PHOSPHATE 10 MG/ML IJ SOLN
INTRAMUSCULAR | Status: AC
  Filled 2012-09-29: qty 1

## 2012-09-29 MED ORDER — DEXAMETHASONE SODIUM PHOSPHATE 10 MG/ML IJ SOLN
INTRAMUSCULAR | Status: DC | PRN
  Administered 2012-09-29: 10 mg via INTRAVENOUS

## 2012-09-29 MED ORDER — LIDOCAINE HCL (CARDIAC) 20 MG/ML IV SOLN
INTRAVENOUS | Status: AC
  Filled 2012-09-29: qty 5

## 2012-09-29 MED ORDER — NALOXONE HCL 0.4 MG/ML IJ SOLN: 0.4000 mg | INTRAMUSCULAR | Status: DC | PRN

## 2012-09-29 MED ORDER — LACTATED RINGERS IV SOLN
INTRAVENOUS | Status: DC
  Administered 2012-09-29 (×3): via INTRAVENOUS

## 2012-09-29 MED ORDER — BUPIVACAINE HCL (PF) 0.25 % IJ SOLN
INTRAMUSCULAR | Status: DC | PRN
  Administered 2012-09-29: 20 mL

## 2012-09-29 MED ORDER — GLYCOPYRROLATE 0.2 MG/ML IJ SOLN
INTRAMUSCULAR | Status: DC | PRN
  Administered 2012-09-29: 0.4 mg via INTRAVENOUS
  Administered 2012-09-29: .6 mg via INTRAVENOUS
  Administered 2012-09-29: 0.2 mg via INTRAVENOUS

## 2012-09-29 MED ORDER — ALBUTEROL SULFATE HFA 108 (90 BASE) MCG/ACT IN AERS
2.0000 | INHALATION_SPRAY | Freq: Four times a day (QID) | RESPIRATORY_TRACT | Status: DC | PRN
  Filled 2012-09-29: qty 6.7

## 2012-09-29 SURGICAL SUPPLY — 83 items
BARRIER ADHS 3X4 INTERCEED (GAUZE/BANDAGES/DRESSINGS) IMPLANT
CABLE HIGH FREQUENCY MONO STRZ (ELECTRODE) IMPLANT
CANISTER SUCTION 2500CC (MISCELLANEOUS) ×4 IMPLANT
CATH ROBINSON RED A/P 16FR (CATHETERS) IMPLANT
CHLORAPREP W/TINT 26ML (MISCELLANEOUS) ×4 IMPLANT
CLOTH BEACON ORANGE TIMEOUT ST (SAFETY) ×4 IMPLANT
CONT PATH 16OZ SNAP LID 3702 (MISCELLANEOUS) ×4 IMPLANT
COVER MAYO STAND STRL (DRAPES) ×4 IMPLANT
COVER TABLE BACK 60X90 (DRAPES) ×4 IMPLANT
DECANTER SPIKE VIAL GLASS SM (MISCELLANEOUS) IMPLANT
DERMABOND ADVANCED (GAUZE/BANDAGES/DRESSINGS) ×1
DERMABOND ADVANCED .7 DNX12 (GAUZE/BANDAGES/DRESSINGS) ×3 IMPLANT
DISSECTOR BLUNT TIP ENDO 5MM (MISCELLANEOUS) IMPLANT
DISSECTOR SPONGE CHERRY (GAUZE/BANDAGES/DRESSINGS) ×4 IMPLANT
DRAPE HYSTEROSCOPY (DRAPE) IMPLANT
DRAPE PROXIMA HALF (DRAPES) ×8 IMPLANT
DRESSING TELFA 8X3 (GAUZE/BANDAGES/DRESSINGS) ×4 IMPLANT
ELECT REM PT RETURN 9FT ADLT (ELECTROSURGICAL) ×4
ELECTRODE REM PT RTRN 9FT ADLT (ELECTROSURGICAL) ×3 IMPLANT
EVACUATOR SMOKE 8.L (FILTER) ×8 IMPLANT
FORCEPS CUTTING 33CM 5MM (CUTTING FORCEPS) ×4 IMPLANT
GAUZE PACKING 2X5 YD STERILE (GAUZE/BANDAGES/DRESSINGS) IMPLANT
GAUZE SPONGE 4X4 16PLY XRAY LF (GAUZE/BANDAGES/DRESSINGS) ×4 IMPLANT
GLOVE BIO SURGEON STRL SZ7.5 (GLOVE) ×4 IMPLANT
GLOVE BIOGEL PI IND STRL 7.5 (GLOVE) ×12 IMPLANT
GLOVE BIOGEL PI INDICATOR 7.5 (GLOVE) ×4
GOWN PREVENTION PLUS LG XLONG (DISPOSABLE) ×8 IMPLANT
GOWN STRL REIN XL XLG (GOWN DISPOSABLE) ×20 IMPLANT
HEMOSTAT SURGICEL 2X14 (HEMOSTASIS) IMPLANT
HEMOSTAT SURGICEL 4X8 (HEMOSTASIS) IMPLANT
NEEDLE HYPO 25X1 1.5 SAFETY (NEEDLE) IMPLANT
NEEDLE INSUFFLATION 120MM (ENDOMECHANICALS) ×4 IMPLANT
NEEDLE MAYO .5 CIRCLE (NEEDLE) IMPLANT
NS IRRIG 1000ML POUR BTL (IV SOLUTION) ×4 IMPLANT
OCCLUDER COLPOPNEUMO (BALLOONS) ×4 IMPLANT
PACK LAPAROSCOPY BASIN (CUSTOM PROCEDURE TRAY) ×4 IMPLANT
PACK LAVH (CUSTOM PROCEDURE TRAY) ×4 IMPLANT
PACK VAGINAL WOMENS (CUSTOM PROCEDURE TRAY) ×4 IMPLANT
PAD OB MATERNITY 4.3X12.25 (PERSONAL CARE ITEMS) ×4 IMPLANT
PROTECTOR NERVE ULNAR (MISCELLANEOUS) ×8 IMPLANT
SCALPEL HARMONIC ACE (MISCELLANEOUS) ×4 IMPLANT
SCISSORS LAP 5X35 DISP (ENDOMECHANICALS) IMPLANT
SET CYSTO W/LG BORE CLAMP LF (SET/KITS/TRAYS/PACK) ×4 IMPLANT
SET IRRIG TUBING LAPAROSCOPIC (IRRIGATION / IRRIGATOR) ×4 IMPLANT
SOLUTION ELECTROLUBE (MISCELLANEOUS) IMPLANT
SPONGE LAP 18X18 X RAY DECT (DISPOSABLE) ×8 IMPLANT
SPONGE SURGIFOAM ABS GEL 12-7 (HEMOSTASIS) ×4 IMPLANT
STAPLER VISISTAT 35W (STAPLE) IMPLANT
STRIP CLOSURE SKIN 1/4X3 (GAUZE/BANDAGES/DRESSINGS) IMPLANT
STRIP CLOSURE SKIN 1/4X4 (GAUZE/BANDAGES/DRESSINGS) IMPLANT
SUT CHROMIC 2 0 CT 1 (SUTURE) ×4 IMPLANT
SUT CHROMIC 2 0 SH (SUTURE) IMPLANT
SUT CHROMIC 2 0 TIES 18 (SUTURE) IMPLANT
SUT MNCRL AB 3-0 PS2 27 (SUTURE) ×12 IMPLANT
SUT MON AB 3-0 SH 27 (SUTURE) ×2
SUT MON AB 3-0 SH27 (SUTURE) ×6 IMPLANT
SUT MON AB 4-0 PS1 27 (SUTURE) ×4 IMPLANT
SUT PDS AB 1 CT1 36 (SUTURE) ×12 IMPLANT
SUT PDS AB 1 CTX 36 (SUTURE) IMPLANT
SUT PLAIN 2 0 XLH (SUTURE) IMPLANT
SUT VIC AB 0 CT1 18XCR BRD8 (SUTURE) IMPLANT
SUT VIC AB 0 CT1 27 (SUTURE) ×4
SUT VIC AB 0 CT1 27XBRD ANBCTR (SUTURE) ×12 IMPLANT
SUT VIC AB 0 CT1 36 (SUTURE) ×4 IMPLANT
SUT VIC AB 0 CT1 8-18 (SUTURE)
SUT VICRYL 0 TIES 12 18 (SUTURE) ×4 IMPLANT
SUT VICRYL 0 UR6 27IN ABS (SUTURE) ×8 IMPLANT
SYR 50ML LL SCALE MARK (SYRINGE) ×4 IMPLANT
SYR CONTROL 10ML LL (SYRINGE) ×4 IMPLANT
SYR TB 1ML LUER SLIP (SYRINGE) ×4 IMPLANT
TIP UTERINE 5.1X6CM LAV DISP (MISCELLANEOUS) IMPLANT
TIP UTERINE 6.7X10CM GRN DISP (MISCELLANEOUS) IMPLANT
TIP UTERINE 6.7X6CM WHT DISP (MISCELLANEOUS) IMPLANT
TIP UTERINE 6.7X8CM BLUE DISP (MISCELLANEOUS) ×4 IMPLANT
TOWEL OR 17X24 6PK STRL BLUE (TOWEL DISPOSABLE) ×8 IMPLANT
TRAY FOLEY CATH 14FR (SET/KITS/TRAYS/PACK) ×4 IMPLANT
TROCAR BALLN 12MMX100 BLUNT (TROCAR) ×4 IMPLANT
TROCAR XCEL NON-BLD 11X100MML (ENDOMECHANICALS) ×8 IMPLANT
TROCAR XCEL NON-BLD 5MMX100MML (ENDOMECHANICALS) ×4 IMPLANT
TROCAR XCEL OPT SLVE 5M 100M (ENDOMECHANICALS) ×8 IMPLANT
TUBING FILTER THERMOFLATOR (ELECTROSURGICAL) ×4 IMPLANT
WARMER LAPAROSCOPE (MISCELLANEOUS) ×4 IMPLANT
WATER STERILE IRR 1000ML POUR (IV SOLUTION) ×4 IMPLANT
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
## 2012-09-29 NOTE — Transfer of Care (Signed)
Immediate Anesthesia Transfer of Care Note  Patient: Yvonne [name]  Procedure(s) Performed: Procedure(s) with comments: HYSTERECTOMY TOTAL LAPAROSCOPIC (Bilateral) - Total Laparoscopic Hysterectomy, Bilateral Salpingectomies, Cystoscopy poss. LAVH poss. TAH LAPAROSCOPIC ASSISTED VAGINAL HYSTERECTOMY (N/A) HYSTERECTOMY ABDOMINAL (N/A) CYSTOSCOPY (Bilateral) BILATERAL SALPINGECTOMY (Bilateral)  Patient Location: PACU  Anesthesia Type:General  Level of Consciousness: awake and oriented  Airway & Oxygen Therapy: Patient Spontanous Breathing and Patient connected to nasal cannula oxygen  Post-op Assessment: Report given to PACU RN and Post -op Vital signs reviewed and stable  Post vital signs: Reviewed and stable  Complications: No apparent anesthesia complications
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
## 2012-09-29 NOTE — Interval H&P Note (Signed)
History and Physical Interval Note:  09/29/2012 2:03 PM  Yvonne [name]  has presented today for surgery, with the diagnosis of Dysfunctional Uterine Bleeding;    The various methods of treatment have been discussed with the patient and family. After consideration of risks, benefits and other options for treatment, the patient has consented to  Procedure(s) with comments: HYSTERECTOMY TOTAL LAPAROSCOPIC (Bilateral) - Total Laparoscopic Hysterectomy, Bilateral Salpingectomies, Cystoscopy poss. LAVH poss. TAH (LAPAROSCOPIC ASSISTED VAGINAL HYSTERECTOMY (N/A) HYSTERECTOMY ABDOMINAL (N/A)) as a surgical intervention .  The patient's history has been reviewed, patient examined, no change in status, stable for surgery.  I have reviewed the patient's chart and labs.  Questions were answered to the patient's satisfaction.     Daryle Amis Y  I reviewed the surgery with the patient, risks, benefits, alternatives reviewed and questions answered.  Consent signed and witnessed.  Pt denies any medications except ibuprofen prn and asa just upsets her stomach.

## 2012-09-29 NOTE — Anesthesia Postprocedure Evaluation (Signed)
Anesthesia Post Note  Patient: Yvonne [name]  Procedure(s) Performed: Procedure(s) (LRB): HYSTERECTOMY TOTAL LAPAROSCOPIC (Bilateral) LAPAROSCOPIC ASSISTED VAGINAL HYSTERECTOMY (N/A) HYSTERECTOMY ABDOMINAL (N/A) CYSTOSCOPY (Bilateral) BILATERAL SALPINGECTOMY (Bilateral)  Anesthesia type: General  Patient location: PACU  Post pain: Pain level controlled  Post assessment: Post-op Vital signs reviewed  Last Vitals:  Filed Vitals:   09/29/12 1945  BP: 128/64  Pulse: 69  Temp:   Resp: 12    Post vital signs: Reviewed  Level of consciousness: sedated  Complications: No apparent anesthesia complications

## 2012-09-29 NOTE — Op Note (Signed)
Preop Diagnosis: Dysfunctional Uterine Bleeding;     Postop Diagnosis: Dysfunctional Uterine Bleeding;     Procedure: HYSTERECTOMY TOTAL LAPAROSCOPIC BILATERAL SALPINGECTOMY CYSTOSCOPY   Anesthesia: General   Anesthesiologist: Dr. Dana Allan and Cristela Blue  Attending: Purcell Nails, MD   Assistant: Jaymes Graff, MD  Findings: Several adhesions of bladder to anterior abdominal wall and anterior uterus.  Filschie clips bilaterally.  Pathology: uterus and cervix (less than 250g)  Fluids: 1200 cc  UOP: 250 cc  EBL: 50 cc  Complications: None  Procedure: The patient was taken to the operating room, placed under general anesthesia and prepped and draped in the normal sterile fashion. A Foley catheter was placed in the bladder. The uterus sounded to 11cm. A weighted speculum and vaginal retractors were placed in the vagina. Tenaculum was placed on the anterior lip of the cervix.  A size 10 cm tip was used and the rumi was placed, tip balloon and occluder insufflated. Attention was then turned to the abdomen. A 10 mm infraumbilical incision was made with the scalpel after 5 cc of 25% percent Marcaine was used for local anesthesia. The subcutaneous tissue was dissected and the fascia was incised with the knife. A purse string stitch was placed in the fascia and Hassan placed into the intra-abdominal cavity and anchored to the suture. Intraabdominal placement was confirmed with the laparoscope.  Two 5 mm trochars were placed in the right and left lower quadrants under direct visualization with the laparoscope.  A 10 mm incision was made in the suprapubic area and trochar advanced under direct visualization.  The harmonic scalpel was used to cauterize and cut the uterine ovarian ligaments bilaterally.   Both round ligaments were cauterized and cut with the harmonic scalpel as well and the bladder flap created with the harmonic scalpel and removed away from the uterus. Both uterine  arteries were cauterized and cut with harmonic scalpel.  The harmonic was used to circumscribe the [facility] ring and free the uterus and cervix. The uterus was then pulled into the vagina.  Both angles were sutured with 1 PDS.  The  remainder of the cuff was sutured with 1 PDS using interrupted stitches until the vaginal cuff was closed.  A total of approximately 5 sutures were used.   Irrigation was performed.  Gas was allowed to leave the abdomen to check for bleeders and all pedicles were seen to be hemostatic the patient was given indigo carmine.  Cystoscopy was performed and both ureters were seen to efflux indigo carmine without difficulty. The bladder had full integrity with no suture or laceration visualized.  The vagina was inspected and the cuff was noted to be intact.  Attention was then turned back to the abdomen after removing top pair of gloves. The abdomen was reinsufflated with CO2 gas.   The abdomen and pelvis was copiously irrigated and  hemostasis was noted.  The 10 mm port in the suprapubic area was closed with 0 Vicryl using the fascial closure device.  All trochars were removed under direct visualization using the laparoscope.  The umbilical fascia was reapproximated by tying the circumferential suture. The two 10 mm incisions were closed with 3-0 Monocryl via a subcuticular stitch.  Gelfoam was placed in the subcutaneous space at the umbilicus secondary to oozing uncontrolled completely with 3-0 monocryl stitches.  All remaining skin incisions were closed with Dermabond and the 10 mm skin incisions were reinforced using Dermabond.  Sponge lap and needle counts were correct.  The  patient tolerated the procedure well and was returned to the PACU in stable condition

## 2012-09-30 ENCOUNTER — Encounter (HOSPITAL_COMMUNITY): Payer: Self-pay | Admitting: Obstetrics and Gynecology

## 2012-09-30 LAB — CBC
HCT: 34.2 % — ABNORMAL LOW (ref 36.0–46.0)
RDW: 12.8 % (ref 11.5–15.5)
WBC: 21.9 10*3/uL — ABNORMAL HIGH (ref 4.0–10.5)

## 2012-09-30 MED ORDER — IBUPROFEN 600 MG PO TABS: ORAL_TABLET | ORAL | Status: DC

## 2012-09-30 MED ORDER — PROMETHAZINE HCL 12.5 MG PO TABS: 12.5000 mg | ORAL_TABLET | Freq: Four times a day (QID) | ORAL | Status: DC | PRN

## 2012-09-30 MED ORDER — OXYCODONE-ACETAMINOPHEN 5-325 MG PO TABS: 1.0000 | ORAL_TABLET | ORAL | Status: DC | PRN

## 2012-09-30 MED ORDER — KETOROLAC TROMETHAMINE 30 MG/ML IJ SOLN
30.0000 mg | Freq: Four times a day (QID) | INTRAMUSCULAR | Status: DC
  Administered 2012-09-30 (×2): 30 mg via INTRAVENOUS
  Filled 2012-09-30 (×2): qty 1

## 2012-09-30 MED ORDER — CYCLOBENZAPRINE HCL 10 MG PO TABS
10.0000 mg | ORAL_TABLET | Freq: Three times a day (TID) | ORAL | Status: DC
  Administered 2012-09-30: 10 mg via ORAL
  Filled 2012-09-30 (×4): qty 1

## 2012-09-30 MED ORDER — CYCLOBENZAPRINE HCL 10 MG PO TABS: ORAL_TABLET | ORAL | Status: DC

## 2012-09-30 NOTE — Progress Notes (Signed)
Pt is discharged in the care of father.  Downstairs per ambulatory. Stable. Discharged instructions were given to pt. Qanuestions were asked and answered  Understands all instructions well. Abdominal incisions are clean and dry Denies any pain or discomfort.

## 2012-09-30 NOTE — Progress Notes (Signed)
Yvonne [name] is a26 y.o.  [id number]  Post Op Date #1;  TLH/BS/Cystoscopy  Subjective: Patient is Doing well postoperatively. Patient has lower back pain but no pelvic or abdominal pain.  States she has lumbar disc issues., Has ambulated without difficulty but states that she hasn't been able to get any sleep or rest due to equipment alarms sounding and staff having to come in on a regular basis.  Tolerating liquids and Foley just removed so hasn't voided.  Objective: Vital signs in last 24 hours: Temp:  [97.6 F (36.4 C)-99 F (37.2 C)] 98.4 F (36.9 C) (07/01 0550) Pulse Rate:  [55-95] 55 (07/01 0550) Resp:  [12-18] 16 (07/01 0610) BP: (115-136)/(57-77) 136/74 mmHg (07/01 0550) SpO2:  [98 %-100 %] 99 % (07/01 0610) Weight:  [186 lb (84.369 kg)] 186 lb (84.369 kg) (06/30 1335)  Intake/Output from previous day: 06/30 0701 - 07/01 0700 In: 3714.9 [P.O.:350; I.V.:3364.9] Out: 1225 [Urine:1175] Intake/Output this shift: Total I/O In: -  Out: 100 [Urine:100]  Recent Labs Lab 09/29/12 1330 09/30/12 0520  WBC 10.0 21.9*  HGB 12.4 11.5*  HCT 36.8 34.2*  PLT 238 216    No results found for this basename: NA, K, CL, CO2, BUN, CREATININE, CALCIUM, LABALBU, PROT, BILITOT, ALKPHOS, ALT, AST, GLUCOSE,  in the last 168 hours  EXAM: General: alert and appears agitated. Resp: clear to auscultation bilaterally Cardio: regular rate and rhythm, S1, S2 normal, no murmur, click, rub or gallop GI: Bowel sounds present, incisions intact without evidence of infection. Extremities: SCD hose in place and functioning;  no calf tenderness. Vaginal Bleeding: none   Assessment: s/p Procedure(s): HYSTERECTOMY TOTAL LAPAROSCOPIC LAPAROSCOPIC ASSISTED VAGINAL HYSTERECTOMY HYSTERECTOMY ABDOMINAL CYSTOSCOPY BILATERAL SALPINGECTOMY: stable  Plan: Advance diet Encourage ambulation Advance to PO medication Flexeril 10 mg po every 6 hours as needed for back pain Toradol 30 mg IV x 1 for  back pain  LOS: 1 day    Dajsha Massaro, PA-C 09/30/2012 7:54 AM

## 2012-09-30 NOTE — Discharge Summary (Signed)
Physician Discharge Summary  Patient ID: Yvonne [name] MRN: [id number] DOB/AGE: 1986-02-27 27 y.o.  Admit date: 09/29/2012 Discharge date: 09/30/2012   Discharge Diagnoses:  Dysfunctional Uterine Bleeding Active Problems:   * No active hospital problems. *   Operation: Total Laparoscopic Hysterectomy, Bilateral Salpingectomy and Cystoscopy   Discharged Condition: Good  Hospital Course: On the date of admission the patient underwent the aforementioned procedures and tolerated them well.  Post operative course was unremarkable with the patient resuming bowel and bladder function by post operative day #1 and was therefore deemed ready for discharge home.  Disposition: 01-Home or Self Care  Discharge Medications:    Medication List    STOP taking these medications       HYDROcodone-acetaminophen 5-325 MG per tablet  Commonly known as:  NORCO/VICODIN      TAKE these medications       albuterol 108 (90 BASE) MCG/ACT inhaler  Commonly known as:  PROVENTIL HFA;VENTOLIN HFA  Inhale 2 puffs into the lungs every 6 (six) hours as needed for wheezing or shortness of breath (asthma).     cyclobenzaprine 10 MG tablet  Commonly known as:  FLEXERIL  1 po tid prn for back pain     ibuprofen 600 MG tablet  Commonly known as:  ADVIL,MOTRIN  1 po pc every 6 hours for 5 days then as needed for pain     oxyCODONE-acetaminophen 5-325 MG per tablet  Commonly known as:  PERCOCET/ROXICET  Take 1-2 tablets by mouth every 4 (four) hours as needed for pain.     promethazine 12.5 MG tablet  Commonly known as:  PHENERGAN  Take 1 tablet (12.5 mg total) by mouth every 6 (six) hours as needed for nausea.        Follow-up: Dr. Osborn Coho,  November 04, 2012 at 10:45 a.m.   SignedHenreitta Leber, PA-C 09/30/2012, 8:12 AM   Pt doing well and ready for d/c home.  She has f/u scheduled.  She has voided spontaneously without difficulty and is ambulating and tolerating po without difficulty.

## 2012-10-04 ENCOUNTER — Other Ambulatory Visit: Payer: Self-pay | Admitting: Obstetrics and Gynecology

## 2012-10-04 ENCOUNTER — Encounter (HOSPITAL_COMMUNITY): Payer: Self-pay | Admitting: *Deleted

## 2012-10-04 ENCOUNTER — Inpatient Hospital Stay (HOSPITAL_COMMUNITY)
Admission: AD | Admit: 2012-10-04 | Discharge: 2012-10-04 | Disposition: A | Discharge status: Home / Self Care | Payer: Medicaid Other | Source: Ambulatory Visit | Attending: Obstetrics and Gynecology | Admitting: Obstetrics and Gynecology

## 2012-10-04 DIAGNOSIS — Z90710 Acquired absence of both cervix and uterus: Secondary | ICD-10-CM | POA: Insufficient documentation

## 2012-10-04 DIAGNOSIS — R300 Dysuria: Secondary | ICD-10-CM | POA: Insufficient documentation

## 2012-10-04 DIAGNOSIS — K5900 Constipation, unspecified: Secondary | ICD-10-CM | POA: Insufficient documentation

## 2012-10-04 LAB — URINALYSIS, ROUTINE W REFLEX MICROSCOPIC
Leukocytes, UA: NEGATIVE
Nitrite: NEGATIVE
Specific Gravity, Urine: 1.01 (ref 1.005–1.030)
Urobilinogen, UA: 0.2 mg/dL (ref 0.0–1.0)

## 2012-10-04 LAB — URINE MICROSCOPIC-ADD ON

## 2012-10-04 MED ORDER — SULFAMETHOXAZOLE-TMP DS 800-160 MG PO TABS
1.0000 | ORAL_TABLET | Freq: Once | ORAL | Status: AC
  Administered 2012-10-04: 1 via ORAL
  Filled 2012-10-04: qty 1

## 2012-10-04 MED ORDER — HYDROCODONE-ACETAMINOPHEN 5-325 MG PO TABS
1.0000 | ORAL_TABLET | ORAL | Status: DC | PRN
  Administered 2012-10-04: 1 via ORAL
  Filled 2012-10-04: qty 1

## 2012-10-04 MED ORDER — HYDROCODONE-ACETAMINOPHEN 5-325 MG PO TABS: 1.0000 | ORAL_TABLET | ORAL | Status: DC | PRN

## 2012-10-04 NOTE — MAU Provider Note (Signed)
History   27 yo [id number] s/p L/S hysterectomy on 6/30 for DUB presented after calling with vaginal bleeding, dysuria, constipation, and poor pain management.  Surgery went well, and she was d/c'd home on day 1 on Percocet. Patient reports taking Ibuprophen 800 mg and Percocet this week with little benefit.  "Vicodin works better for me".  Denies N/V, fever, discharge, any IC--did have BM today, with bleeding beginning before that.  Also reports flatus. Was more active yesterday than she had been, walking outside more.  Patient Active Problem List   Diagnosis Date Noted  . S/P endometrial ablation - HTA 2012 06/03/2012  . Sterilization - h/o BTL 06/03/2012  . OBESITY 09/03/2007  . MIGRAINE HEADACHE 09/03/2007  Reports she has had "15 surgeries", including ablation, BTL, appy, cholecystectomy, and C/S.   Chief Complaint  Patient presents with  . Post-op Problem     OB History   Grav Para Term Preterm Abortions TAB SAB Ect Mult Living   4 4 4       4       Past Medical History  Diagnosis Date  . Asthma     albuterol inhaler  . Blood transfusion     2008 with c/s 4 units transfused  . Headache(784.0)     otc ibuprofen 800mg   . 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 Depression   . Chronic back pain     Past Surgical History  Procedure Laterality Date  . Appendectomy    . Cesarean section    . Cholecystectomy    . Laparoscopy    . Hand surgery    . Tonsillectomy      T & A  . Tubal ligation    . Endometrial ablation    . Laparoscopic hysterectomy Bilateral 09/29/2012    Procedure: HYSTERECTOMY TOTAL LAPAROSCOPIC;  Surgeon: Purcell Nails, MD;  Location: [facility];  Service: Gynecology;  Laterality: Bilateral;  Total Laparoscopic Hysterectomy, Bilateral Salpingectomies, Cystoscopy poss. LAVH poss. TAH  . Laparoscopic assisted vaginal hysterectomy N/A 09/29/2012    Procedure: LAPAROSCOPIC ASSISTED VAGINAL HYSTERECTOMY;  Surgeon: Purcell Nails,  MD;  Location: [facility];  Service: Gynecology;  Laterality: N/A;  . Abdominal hysterectomy N/A 09/29/2012    Procedure: HYSTERECTOMY ABDOMINAL;  Surgeon: Purcell Nails, MD;  Location: [facility];  Service: Gynecology;  Laterality: N/A;  . Cystoscopy Bilateral 09/29/2012    Procedure: CYSTOSCOPY;  Surgeon: Purcell Nails, MD;  Location: [facility];  Service: Gynecology;  Laterality: Bilateral;  . Bilateral salpingectomy Bilateral 09/29/2012    Procedure: BILATERAL SALPINGECTOMY;  Surgeon: Purcell Nails, MD;  Location: [facility];  Service: Gynecology;  Laterality: Bilateral;    Family History  Problem Relation Age of Onset  . Diabetes Mother   . Diabetes Maternal Grandmother     History  Substance Use Topics  . Smoking status: Former Smoker -- 0.50 packs/day    Types: Cigarettes    Quit date: 01/04/2012  . Smokeless tobacco: Never Used  . Alcohol Use: 0.6 oz/week    1 Glasses of wine per week     Comment: ocassionally    Allergies:  Allergies  Allergen Reactions  . Reglan (Metoclopramide Hcl) Anaphylaxis  . Aspirin Nausea And Vomiting  . Bee Venom Swelling and Other (See Comments)    fainting   On Ibuprophen 800 mg po q 8 hours and Percocet 5/325 q 4 hours prn., but has been taking irregularly.  Physical Exam   Blood pressure 124/70, pulse 96, temperature 97.9 F (36.6 C), temperature source Oral, resp. rate 18, height 5' (1.524 m), weight 186 lb (84.369 kg), last menstrual period 07/25/2012.  Chest clear Heart RRR without murmur Abd soft, mild tenderness at L/S sites, but no rebound or guarding.  All L/S sites cleaned and inspected--all WNL Pelvic--small amount pink spotting in vagina.  Speculum exam--difficult to visualize vaginal cuff due to very posterior position of the cervix, but no active bleeding noted visually.  Vaginal cuff feels intact to gentle digital palpation. Ext WNL  Results for orders placed during the hospital encounter of 10/04/12 (from the past 24 hour(s))   URINALYSIS, ROUTINE W REFLEX MICROSCOPIC     Status: Abnormal   Collection Time    10/04/12 11:20 AM      Result Value Range   Color, Urine YELLOW  YELLOW   APPearance CLEAR  CLEAR   Specific Gravity, Urine 1.010  1.005 - 1.030   pH 7.0  5.0 - 8.0   Glucose, UA NEGATIVE  NEGATIVE mg/dL   Hgb urine dipstick LARGE (*) NEGATIVE   Bilirubin Urine NEGATIVE  NEGATIVE   Ketones, ur NEGATIVE  NEGATIVE mg/dL   Protein, ur NEGATIVE  NEGATIVE mg/dL   Urobilinogen, UA 0.2  0.0 - 1.0 mg/dL   Nitrite NEGATIVE  NEGATIVE   Leukocytes, UA NEGATIVE  NEGATIVE  URINE MICROSCOPIC-ADD ON     Status: Abnormal   Collection Time    10/04/12 11:20 AM      Result Value Range   Squamous Epithelial / LPF FEW (*) RARE   WBC, UA 0-2  <3 WBC/hpf   RBC / HPF 3-6  <3 RBC/hpf   Bacteria, UA FEW (*) RARE   Urine culture sent.   ED Course  5 days s/p L/S hysterectomy Likely slight erosion of vaginal cuff Dysuria Constipation  Plan: Consulted with Dr. Richardson Dopp Rx Bactrim DS 1 po BID x 3 days (1st dose here)--sent to [facility]. Rx Vicodin 5/325 1 po q 4 hours prn, #30, no refills. Urine to culture D/C'd home. Take Ibuprophen ATC for next 2 days, use Vicodin for additional pain needs. Recommended Dulcolax po or suppository, Colace, fluids, fruits/veggies. Office will contact her for appt to f/u on Monday.   Patient to call with any heavy bleeding or severe pain.    Nigel Bridgeman CNM, [place] 10/04/2012 12:48 PM

## 2012-10-04 NOTE — MAU Note (Signed)
Pt reports she had hysterectomy on 6/30. Noticed some bloody drainage from incision and vaginal bleeding as well. Reports some increased pain

## 2012-10-05 ENCOUNTER — Inpatient Hospital Stay (HOSPITAL_COMMUNITY): Payer: Medicaid Other

## 2012-10-05 ENCOUNTER — Encounter (HOSPITAL_COMMUNITY): Payer: Self-pay | Admitting: *Deleted

## 2012-10-05 ENCOUNTER — Inpatient Hospital Stay (HOSPITAL_COMMUNITY)
Admission: AD | Admit: 2012-10-05 | Discharge: 2012-10-05 | Disposition: A | Discharge status: Home / Self Care | Payer: Medicaid Other | Source: Ambulatory Visit | Attending: Obstetrics and Gynecology | Admitting: Obstetrics and Gynecology

## 2012-10-05 DIAGNOSIS — Y838 Other surgical procedures as the cause of abnormal reaction of the patient, or of later complication, without mention of misadventure at the time of the procedure: Secondary | ICD-10-CM | POA: Insufficient documentation

## 2012-10-05 DIAGNOSIS — IMO0002 Reserved for concepts with insufficient information to code with codable children: Secondary | ICD-10-CM | POA: Insufficient documentation

## 2012-10-05 LAB — COMPREHENSIVE METABOLIC PANEL
Alkaline Phosphatase: 122 U/L — ABNORMAL HIGH (ref 39–117)
BUN: 5 mg/dL — ABNORMAL LOW (ref 6–23)
GFR calc Af Amer: >90 mL/min (ref >90)
Glucose, Bld: 107 mg/dL — ABNORMAL HIGH (ref 70–99)
Potassium: 3.5 mEq/L (ref 3.5–5.1)
Total Protein: 6.3 g/dL (ref 6.0–8.3)

## 2012-10-05 LAB — CBC
Hemoglobin: 10.2 g/dL — ABNORMAL LOW (ref 12.0–15.0)
Platelets: 297 10*3/uL (ref 150–400)
RBC: 3.55 MIL/uL — ABNORMAL LOW (ref 3.87–5.11)
WBC: 12.7 10*3/uL — ABNORMAL HIGH (ref 4.0–10.5)

## 2012-10-05 LAB — URINALYSIS, ROUTINE W REFLEX MICROSCOPIC
Bilirubin Urine: NEGATIVE
Glucose, UA: NEGATIVE mg/dL
Ketones, ur: NEGATIVE mg/dL
Protein, ur: NEGATIVE mg/dL
Specific Gravity, Urine: 1.01 (ref 1.005–1.030)
Urobilinogen, UA: 0.2 mg/dL (ref 0.0–1.0)
pH: 7 (ref 5.0–8.0)

## 2012-10-05 LAB — WET PREP, GENITAL: Yeast Wet Prep HPF POC: NONE SEEN

## 2012-10-05 LAB — URINE MICROSCOPIC-ADD ON

## 2012-10-05 MED ORDER — IOHEXOL 300 MG/ML  SOLN
100.0000 mL | Freq: Once | INTRAMUSCULAR | Status: AC | PRN
  Administered 2012-10-05: 100 mL via INTRAVENOUS

## 2012-10-05 MED ORDER — LACTATED RINGERS IV SOLN
INTRAVENOUS | Status: DC
  Administered 2012-10-05: 20:00:00 via INTRAVENOUS

## 2012-10-05 MED ORDER — IOHEXOL 300 MG/ML  SOLN
50.0000 mL | INTRAMUSCULAR | Status: AC
  Administered 2012-10-05: 1 mL via ORAL

## 2012-10-05 NOTE — MAU Provider Note (Signed)
History   27 y/o [id number] s/p TLH with bilateral salpingectomy and cystoscopy on June 30 for Abnormal Uterine bleeding . Presents complaining of vaginal bleeding that saturated her sweat pants. She has not bee wearing sanitary pads. This is the second episode of vaginal bleeding. She was seen by nurse midwife Erin Sons yesterday. Minimal bleeding was noted on exam at that time and vaginal cuff appeared intact. The pt was given precautions to return if bleeding became heavy. She returned today after a second episode of bleeding was noted today at 6 pm. She is tolerating po. Has had constipation. Her pain is controlled with ibuprofen and vicodin. Pain rating currently is zero after taking 600 ibuprofen and 1 vicodin at 530 pm today. Prior to the pain medication she had suprapubic pain.   CSN: [id number]  Arrival date and time: 10/05/12 3086   First Provider Initiated Contact with Patient 10/05/12 2013      Chief Complaint  Patient presents with  . Vaginal Bleeding   HPI  OB History   Grav Para Term Preterm Abortions TAB SAB Ect Mult Living   4 4 4       4       Past Medical History  Diagnosis Date  . Asthma     albuterol inhaler  . Blood transfusion     2008 with c/s 4 units transfused  . Headache(784.0)     otc ibuprofen 800mg   . 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 Depression   . Chronic back pain     Past Surgical History  Procedure Laterality Date  . Appendectomy    . Cesarean section    . Cholecystectomy    . Laparoscopy    . Hand surgery    . Tonsillectomy      T & A  . Tubal ligation    . Endometrial ablation    . Laparoscopic hysterectomy Bilateral 09/29/2012    Procedure: HYSTERECTOMY TOTAL LAPAROSCOPIC;  Surgeon: Purcell Nails, MD;  Location: [facility];  Service: Gynecology;  Laterality: Bilateral;  Total Laparoscopic Hysterectomy, Bilateral Salpingectomies, Cystoscopy poss. LAVH poss. TAH  . Laparoscopic assisted vaginal  hysterectomy N/A 09/29/2012    Procedure: LAPAROSCOPIC ASSISTED VAGINAL HYSTERECTOMY;  Surgeon: Purcell Nails, MD;  Location: [facility];  Service: Gynecology;  Laterality: N/A;  . Abdominal hysterectomy N/A 09/29/2012    Procedure: HYSTERECTOMY ABDOMINAL;  Surgeon: Purcell Nails, MD;  Location: [facility];  Service: Gynecology;  Laterality: N/A;  . Cystoscopy Bilateral 09/29/2012    Procedure: CYSTOSCOPY;  Surgeon: Purcell Nails, MD;  Location: [facility];  Service: Gynecology;  Laterality: Bilateral;  . Bilateral salpingectomy Bilateral 09/29/2012    Procedure: BILATERAL SALPINGECTOMY;  Surgeon: Purcell Nails, MD;  Location: [facility];  Service: Gynecology;  Laterality: Bilateral;    Family History  Problem Relation Age of Onset  . Diabetes Mother   . Diabetes Maternal Grandmother     History  Substance Use Topics  . Smoking status: Former Smoker -- 0.50 packs/day    Types: Cigarettes    Quit date: 01/04/2012  . Smokeless tobacco: Never Used  . Alcohol Use: 0.6 oz/week    1 Glasses of wine per week     Comment: ocassionally    Allergies:  Allergies  Allergen Reactions  . Reglan (Metoclopramide Hcl) Anaphylaxis  . Aspirin Nausea And Vomiting  . Bee Venom Swelling and Other (See Comments)    fainting  Prescriptions prior to admission  Medication Sig Dispense Refill  . cyclobenzaprine (FLEXERIL) 10 MG tablet 1 po tid prn for back pain  30 tablet  0  . HYDROcodone-acetaminophen (NORCO/VICODIN) 5-325 MG per tablet Take 1 tablet by mouth every 4 (four) hours as needed.  30 tablet  0  . ibuprofen (ADVIL,MOTRIN) 600 MG tablet 1 po pc every 6 hours for 5 days then as needed for pain  30 tablet  1  . albuterol (PROVENTIL HFA;VENTOLIN HFA) 108 (90 BASE) MCG/ACT inhaler Inhale 2 puffs into the lungs every 6 (six) hours as needed for wheezing or shortness of breath (asthma).      . promethazine (PHENERGAN) 12.5 MG tablet Take 1 tablet (12.5 mg total) by mouth every 6 (six) hours as needed  for nausea.  20 tablet  0    Review of Systems  Constitutional: Negative.   HENT: Negative.   Respiratory: Negative.   Cardiovascular: Negative.   Gastrointestinal: Positive for constipation. Negative for nausea, vomiting and abdominal pain.  Genitourinary: Negative for dysuria, urgency, frequency, hematuria and flank pain.  Musculoskeletal: Positive for back pain.  Neurological: Negative.    Physical Exam   Blood pressure 106/67, pulse 107, temperature 98.4 F (36.9 C), temperature source Oral, resp. rate 16, height 5' (1.524 m), weight 87.272 kg (192 lb 6.4 oz), last menstrual period 07/25/2012, SpO2 100.00%.  Physical Exam  Constitutional: She is oriented to person, place, and time. She appears well-developed and well-nourished.  Cardiovascular: Normal rate.   Respiratory: Effort normal and breath sounds normal.  GI: Soft. She exhibits no distension and no mass. There is no tenderness. There is no rebound and no guarding.  Genitourinary: Vagina normal.  Musculoskeletal: Normal range of motion. She exhibits no edema.  Neurological: She is alert and oriented to person, place, and time.  Skin: Skin is warm and dry.  Psychiatric: She has a normal mood and affect.  Incisions are healing well no erythema or exudate noted.  Pelvic exam.. Minimal serosanguinous drainage in vaginal vault... No active bleeding is noted.. Vaginal cuff is intact visually and on digital exam .  MAU Course  CT Abdomen Pelvis W Contrast Date/Time: 10/05/2012 8:29 PM Performed by: Jessee Avers. Authorized by: Jessee Avers    Ct scan shows complex fluid collection 5.7 cm x 4.5 cm consistent with hematoma vs developing abscess   Assessment and Plan  POD #6 s/p Total laparoscopic hysterectomy with bilateral salpingectomy with post operative bleeding. Secondary to 5.7 cm hematoma.. Pt to follow up outpatient with Dr. Su Hilt at [facility] Pt given instructions to call if she has extremely heavy bleeding/ temp  greater than 100.4.  Riot Barrick J. 10/05/2012, 8:20 PM

## 2012-10-05 NOTE — MAU Note (Signed)
Had hysterectomy last Monday. Came in yesterday to MAU for vaginal bleeding; was told if bleeding returned to come back to MAU. Pt had episode of bleeding through clothes this afternoon so returned. Denies abdominal pain.

## 2012-10-05 NOTE — MAU Note (Signed)
Pt c/o bleeding episode X1 today at 1800. Pale pink colored discharge dampened crotch of sweat pants.  Says the pain is mild in her low right sided abd; but says she has started with low central abdominal pain upon urination since last night.

## 2012-10-06 LAB — URINE CULTURE: Colony Count: 15000

## 2012-10-09 ENCOUNTER — Inpatient Hospital Stay (HOSPITAL_COMMUNITY)
Admission: AD | Admit: 2012-10-09 | Discharge: 2012-10-09 | Disposition: A | Discharge status: Home / Self Care | Payer: Medicaid Other | Source: Ambulatory Visit | Attending: Obstetrics and Gynecology | Admitting: Obstetrics and Gynecology

## 2012-10-09 ENCOUNTER — Encounter (HOSPITAL_COMMUNITY): Payer: Self-pay | Admitting: *Deleted

## 2012-10-09 DIAGNOSIS — G8918 Other acute postprocedural pain: Secondary | ICD-10-CM | POA: Insufficient documentation

## 2012-10-09 DIAGNOSIS — R109 Unspecified abdominal pain: Secondary | ICD-10-CM | POA: Insufficient documentation

## 2012-10-09 LAB — URINALYSIS, ROUTINE W REFLEX MICROSCOPIC
Glucose, UA: NEGATIVE mg/dL
Protein, ur: NEGATIVE mg/dL
Specific Gravity, Urine: 1.01 (ref 1.005–1.030)
Urobilinogen, UA: 0.2 mg/dL (ref 0.0–1.0)

## 2012-10-09 LAB — URINE MICROSCOPIC-ADD ON

## 2012-10-09 MED ORDER — HYDROMORPHONE HCL 2 MG PO TABS
2.0000 mg | ORAL_TABLET | Freq: Once | ORAL | Status: AC
  Administered 2012-10-09: 2 mg via ORAL
  Filled 2012-10-09: qty 1

## 2012-10-09 MED ORDER — IBUPROFEN 800 MG PO TABS
800.0000 mg | ORAL_TABLET | Freq: Once | ORAL | Status: AC
  Administered 2012-10-09: 800 mg via ORAL
  Filled 2012-10-09: qty 1

## 2012-10-09 NOTE — MAU Note (Signed)
Pt reports she had a hysterectomy on 6/30. Still having pain. Came in On Sunday and told she had a blood clot "sitting on her bladder. Pain keep her up at night and nothing helps.

## 2012-10-09 NOTE — MAU Provider Note (Signed)
History     CSN: [id number]  Arrival date and time: 10/09/12 2026   None     Chief Complaint  Patient presents with  . Post-op Problem   HPI Comments: Pt is a 27yo POD#10 total TLH by Dr. Su Hilt, presents today w abdominal pain that began about 2pm today, states she took motrin and 1 tab percocet at that time w no relief since. Reports no dysuria, or constipation, no N/V. Last BM today 6pm. States still having vag bleeding, was seen on 7/6 and CT at that time showed hematoma over bladder.       Past Medical History  Diagnosis Date  . Asthma     albuterol inhaler  . Blood transfusion     2008 with c/s 4 units transfused  . Headache(784.0)     otc ibuprofen 800mg   . 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 Depression   . Chronic back pain     Past Surgical History  Procedure Laterality Date  . Appendectomy    . Cesarean section    . Cholecystectomy    . Laparoscopy    . Hand surgery    . Tonsillectomy      T & A  . Tubal ligation    . Endometrial ablation    . Laparoscopic hysterectomy Bilateral 09/29/2012    Procedure: HYSTERECTOMY TOTAL LAPAROSCOPIC;  Surgeon: Purcell Nails, MD;  Location: [facility];  Service: Gynecology;  Laterality: Bilateral;  Total Laparoscopic Hysterectomy, Bilateral Salpingectomies, Cystoscopy poss. LAVH poss. TAH  . Laparoscopic assisted vaginal hysterectomy N/A 09/29/2012    Procedure: LAPAROSCOPIC ASSISTED VAGINAL HYSTERECTOMY;  Surgeon: Purcell Nails, MD;  Location: [facility];  Service: Gynecology;  Laterality: N/A;  . Abdominal hysterectomy N/A 09/29/2012    Procedure: HYSTERECTOMY ABDOMINAL;  Surgeon: Purcell Nails, MD;  Location: [facility];  Service: Gynecology;  Laterality: N/A;  . Cystoscopy Bilateral 09/29/2012    Procedure: CYSTOSCOPY;  Surgeon: Purcell Nails, MD;  Location: [facility];  Service: Gynecology;  Laterality: Bilateral;  . Bilateral salpingectomy Bilateral 09/29/2012    Procedure: BILATERAL  SALPINGECTOMY;  Surgeon: Purcell Nails, MD;  Location: [facility];  Service: Gynecology;  Laterality: Bilateral;    Family History  Problem Relation Age of Onset  . Diabetes Mother   . Diabetes Maternal Grandmother     History  Substance Use Topics  . Smoking status: Former Smoker -- 0.50 packs/day    Types: Cigarettes    Quit date: 01/04/2012  . Smokeless tobacco: Never Used  . Alcohol Use: 0.6 oz/week    1 Glasses of wine per week     Comment: ocassionally    Allergies:  Allergies  Allergen Reactions  . Reglan (Metoclopramide Hcl) Anaphylaxis  . Aspirin Nausea And Vomiting  . Bee Venom Swelling and Other (See Comments)    fainting    Prescriptions prior to admission  Medication Sig Dispense Refill  . albuterol (PROVENTIL HFA;VENTOLIN HFA) 108 (90 BASE) MCG/ACT inhaler Inhale 2 puffs into the lungs every 6 (six) hours as needed for wheezing or shortness of breath (asthma).      . ibuprofen (ADVIL,MOTRIN) 800 MG tablet Take 800 mg by mouth every 6 (six) hours as needed for pain (For headache.).        Review of Systems  Respiratory: Negative for shortness of breath.   Cardiovascular: Negative for chest pain.  Gastrointestinal: Positive for abdominal pain. Negative for nausea,  vomiting and constipation.  Genitourinary: Negative for dysuria, urgency and frequency.  All other systems reviewed and are negative.   Physical Exam   Blood pressure 129/69, pulse 96, temperature 99.1 F (37.3 C), temperature source Oral, resp. rate 18, height 5' (1.524 m), weight 191 lb 9.6 oz (86.909 kg), last menstrual period 07/25/2012.  Physical Exam  Nursing note and vitals reviewed. Constitutional: She is oriented to person, place, and time. She appears well-developed and well-nourished. She appears distressed.  Pt tearful   HENT:  Head: Normocephalic.  Eyes: Pupils are equal, round, and reactive to light.  Neck: Normal range of motion.  Cardiovascular: Normal rate, regular rhythm  and normal heart sounds.   Respiratory: Effort normal and breath sounds normal.  GI: Soft. Bowel sounds are normal. She exhibits no distension and no mass. There is no tenderness. There is no rebound and no guarding.  Genitourinary:  Deferred   Musculoskeletal: Normal range of motion.  Neurological: She is alert and oriented to person, place, and time. She has normal reflexes.  Skin: Skin is warm and dry.  Psychiatric: She has a normal mood and affect. Her behavior is normal.    MAU Course  Procedures   Assessment and Plan  POD #10, TLH w poor pain mgmnt  Will order motrin, PO dilaudid Will d/w Dr Pennie Rushing for further recommendations    Malissa Hippo 10/09/2012, 9:57 PM    Addendum: at 2251  Pt feels better after meds Reviewed with pt resting at home, medication schedule, and out of work recommendation per Dr Su Hilt is 4-6 weeks. Pt verbalized difficulty with this as she is a single parent and only person working.   Per pt has f/u in office w Dr Su Hilt on 7-14  dc'd home stable condition F/u as above, call [facility] if uncontrolled pain, increased bleeding, etc.   S.Tesla Bochicchio, CNM

## 2012-10-10 LAB — URINE CULTURE: Colony Count: 40000

## 2012-10-16 ENCOUNTER — Encounter (HOSPITAL_COMMUNITY): Payer: Self-pay

## 2012-10-16 ENCOUNTER — Encounter (HOSPITAL_COMMUNITY)
Admission: AD | Disposition: A | Discharge status: Home / Self Care | Payer: Self-pay | Source: Ambulatory Visit | Attending: Obstetrics and Gynecology

## 2012-10-16 ENCOUNTER — Ambulatory Visit (HOSPITAL_COMMUNITY): Payer: Medicaid Other | Admitting: Anesthesiology

## 2012-10-16 ENCOUNTER — Ambulatory Visit (HOSPITAL_COMMUNITY)
Admission: AD | Admit: 2012-10-16 | Discharge: 2012-10-17 | Disposition: A | Discharge status: Home / Self Care | Payer: Medicaid Other | Source: Ambulatory Visit | Attending: Obstetrics and Gynecology | Admitting: Obstetrics and Gynecology

## 2012-10-16 ENCOUNTER — Other Ambulatory Visit: Payer: Self-pay | Admitting: Obstetrics and Gynecology

## 2012-10-16 ENCOUNTER — Encounter (HOSPITAL_COMMUNITY): Payer: Self-pay | Admitting: Anesthesiology

## 2012-10-16 DIAGNOSIS — Y839 Surgical procedure, unspecified as the cause of abnormal reaction of the patient, or of later complication, without mention of misadventure at the time of the procedure: Secondary | ICD-10-CM | POA: Insufficient documentation

## 2012-10-16 DIAGNOSIS — T8131XA Disruption of external operation (surgical) wound, not elsewhere classified, initial encounter: Secondary | ICD-10-CM | POA: Insufficient documentation

## 2012-10-16 HISTORY — PX: EXAMINATION UNDER ANESTHESIA: SHX1540

## 2012-10-16 LAB — CBC WITH DIFFERENTIAL/PLATELET
Basophils Absolute: 0 10*3/uL (ref 0.0–0.1)
Eosinophils Relative: 5 % (ref 0–5)
Lymphocytes Relative: 27 % (ref 12–46)
Neutro Abs: 6.7 10*3/uL (ref 1.7–7.7)
Neutrophils Relative %: 60 % (ref 43–77)
Platelets: 462 10*3/uL — ABNORMAL HIGH (ref 150–400)
RBC: 4.11 MIL/uL (ref 3.87–5.11)
RDW: 12.3 % (ref 11.5–15.5)
WBC: 11.2 10*3/uL — ABNORMAL HIGH (ref 4.0–10.5)

## 2012-10-16 SURGERY — EXAM UNDER ANESTHESIA
Anesthesia: General | Site: Vagina | Wound class: Clean Contaminated

## 2012-10-16 MED ORDER — LACTATED RINGERS IV SOLN
INTRAVENOUS | Status: DC
  Administered 2012-10-16: 125 mL/h via INTRAVENOUS

## 2012-10-16 MED ORDER — FENTANYL CITRATE 0.05 MG/ML IJ SOLN
INTRAMUSCULAR | Status: AC
  Filled 2012-10-16: qty 2

## 2012-10-16 MED ORDER — MIDAZOLAM HCL 5 MG/5ML IJ SOLN
INTRAMUSCULAR | Status: DC | PRN
  Administered 2012-10-16: 2 mg via INTRAVENOUS

## 2012-10-16 MED ORDER — KETOROLAC TROMETHAMINE 30 MG/ML IJ SOLN
INTRAMUSCULAR | Status: AC
  Filled 2012-10-16: qty 1

## 2012-10-16 MED ORDER — FENTANYL CITRATE 0.05 MG/ML IJ SOLN
INTRAMUSCULAR | Status: AC
Start: 2012-10-16 — End: 2012-10-16
  Filled 2012-10-16: qty 4

## 2012-10-16 MED ORDER — DEXAMETHASONE SODIUM PHOSPHATE 10 MG/ML IJ SOLN
INTRAMUSCULAR | Status: DC | PRN
  Administered 2012-10-16: 10 mg via INTRAVENOUS

## 2012-10-16 MED ORDER — KETOROLAC TROMETHAMINE 30 MG/ML IJ SOLN: 30.0000 mg | Freq: Four times a day (QID) | INTRAMUSCULAR | Status: DC

## 2012-10-16 MED ORDER — PROPOFOL 10 MG/ML IV EMUL
INTRAVENOUS | Status: AC
  Filled 2012-10-16: qty 20

## 2012-10-16 MED ORDER — FENTANYL CITRATE 0.05 MG/ML IJ SOLN
25.0000 ug | INTRAMUSCULAR | Status: DC | PRN
  Administered 2012-10-16 – 2012-10-17 (×4): 50 ug via INTRAVENOUS

## 2012-10-16 MED ORDER — LACTATED RINGERS IV SOLN
INTRAVENOUS | Status: DC | PRN
  Administered 2012-10-16 (×4): via INTRAVENOUS

## 2012-10-16 MED ORDER — LIDOCAINE HCL (CARDIAC) 20 MG/ML IV SOLN
INTRAVENOUS | Status: DC | PRN
  Administered 2012-10-16: 100 mg via INTRAVENOUS

## 2012-10-16 MED ORDER — MIDAZOLAM HCL 2 MG/2ML IJ SOLN
INTRAMUSCULAR | Status: AC
  Filled 2012-10-16: qty 2

## 2012-10-16 MED ORDER — DEXAMETHASONE SODIUM PHOSPHATE 10 MG/ML IJ SOLN
INTRAMUSCULAR | Status: AC
  Filled 2012-10-16: qty 1

## 2012-10-16 MED ORDER — FENTANYL CITRATE 0.05 MG/ML IJ SOLN
INTRAMUSCULAR | Status: DC | PRN
  Administered 2012-10-16 (×6): 50 ug via INTRAVENOUS

## 2012-10-16 MED ORDER — LIDOCAINE HCL (CARDIAC) 20 MG/ML IV SOLN
INTRAVENOUS | Status: AC
  Filled 2012-10-16: qty 5

## 2012-10-16 MED ORDER — ONDANSETRON HCL 4 MG/2ML IJ SOLN
INTRAMUSCULAR | Status: AC
  Filled 2012-10-16: qty 2

## 2012-10-16 MED ORDER — KETOROLAC TROMETHAMINE 30 MG/ML IJ SOLN
INTRAMUSCULAR | Status: DC | PRN
  Administered 2012-10-16: 30 mg via INTRAVENOUS

## 2012-10-16 MED ORDER — SODIUM CHLORIDE 0.9 % IJ SOLN
Freq: Once | INTRAMUSCULAR | Status: DC
  Filled 2012-10-16: qty 1

## 2012-10-16 MED ORDER — SODIUM CHLORIDE 0.9 % IJ SOLN
INTRAMUSCULAR | Status: DC | PRN
  Administered 2012-10-16: 22:00:00 via VAGINAL

## 2012-10-16 SURGICAL SUPPLY — 5 items
GLOVE SURG SS PI 7.5 STRL IVOR (GLOVE) ×6 IMPLANT
HEMOSTAT SURGICEL 2X14 (HEMOSTASIS) ×2 IMPLANT
PACK VAGINAL WOMENS (CUSTOM PROCEDURE TRAY) ×2 IMPLANT
SUT PDS AB 0 CT1 27 (SUTURE) ×8 IMPLANT
SYR BULB IRRIGATION 50ML (SYRINGE) ×2 IMPLANT
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
## 2012-10-16 NOTE — Transfer of Care (Signed)
Immediate Anesthesia Transfer of Care Note  Patient: Yvonne [name]  Procedure(s) Performed: Procedure(s) with comments: EXAM UNDER ANESTHESIA (N/A) - Suturing Vaginal Cuff  Patient Location: PACU  Anesthesia Type:General  Level of Consciousness: awake, alert , oriented and patient cooperative  Airway & Oxygen Therapy: Patient Spontanous Breathing and Patient connected to nasal cannula oxygen  Post-op Assessment: Report given to PACU RN and Post -op Vital signs reviewed and stable  Post vital signs: Reviewed and stable  Complications: No apparent anesthesia complications

## 2012-10-16 NOTE — Preoperative (Signed)
Beta Blockers   Reason not to administer Beta Blockers:Not Applicable 
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
## 2012-10-16 NOTE — Transfer of Care (Signed)
Immediate Anesthesia Transfer of Care Note  Patient: Yvonne [name]  Procedure(s) Performed: Procedure(s) with comments: EXAM UNDER ANESTHESIA (N/A) - Suturing Vaginal Cuff  Patient Location: PACU  Anesthesia Type:General  Level of Consciousness: awake, alert  and oriented  Airway & Oxygen Therapy: Patient Spontanous Breathing and Patient connected to nasal cannula oxygen  Post-op Assessment: Report given to PACU RN and Post -op Vital signs reviewed and stable  Post vital signs: Reviewed and stable  Complications: No apparent anesthesia complications

## 2012-10-17 ENCOUNTER — Encounter (HOSPITAL_COMMUNITY): Payer: Self-pay

## 2012-10-17 LAB — CBC
HCT: 32.2 % — ABNORMAL LOW (ref 36.0–46.0)
MCH: 28.1 pg (ref 26.0–34.0)
MCV: 83.9 fL (ref 78.0–100.0)
Platelets: 364 10*3/uL (ref 150–400)
RDW: 12.4 % (ref 11.5–15.5)

## 2012-10-17 MED ORDER — LACTATED RINGERS IV SOLN
INTRAVENOUS | Status: DC
  Administered 2012-10-17: 02:00:00 via INTRAVENOUS

## 2012-10-17 MED ORDER — IBUPROFEN 600 MG PO TABS: 600.0000 mg | ORAL_TABLET | Freq: Four times a day (QID) | ORAL | Status: DC | PRN

## 2012-10-17 MED ORDER — ALBUTEROL SULFATE HFA 108 (90 BASE) MCG/ACT IN AERS
2.0000 | INHALATION_SPRAY | Freq: Four times a day (QID) | RESPIRATORY_TRACT | Status: DC | PRN
  Filled 2012-10-17: qty 6.7

## 2012-10-17 MED ORDER — OXYCODONE-ACETAMINOPHEN 5-325 MG PO TABS
1.0000 | ORAL_TABLET | Freq: Four times a day (QID) | ORAL | Status: DC | PRN
  Administered 2012-10-17: 2 via ORAL
  Filled 2012-10-17: qty 2

## 2012-10-17 MED ORDER — IBUPROFEN 800 MG PO TABS: 800.0000 mg | ORAL_TABLET | Freq: Four times a day (QID) | ORAL | Status: DC | PRN

## 2012-10-17 MED ORDER — OXYCODONE-ACETAMINOPHEN 5-325 MG PO TABS: 1.0000 | ORAL_TABLET | Freq: Four times a day (QID) | ORAL | Status: DC | PRN

## 2012-10-17 NOTE — Discharge Summary (Signed)
Physician Discharge Summary  Patient ID: Yvonne [name] MRN: [id number] DOB/AGE: 04-29-1985 27 y.o.  Admit date: 10/16/2012 Discharge date: 10/17/2012  Admission Diagnoses: Spotting and discomfort going to OR for exam under anesthesia to evaluate cuff  Discharge Diagnoses:  S/p repair of vaginal cuff dehiscence  Active Problems:   * No active hospital problems. *  Discharged Condition: improved  Hospital Course: s/p vaginal cuff repair  Consults: None  Significant Diagnostic Studies: n/a  Treatments: surgery: vaginal cuff repair  Discharge Exam: Blood pressure 115/73, pulse 79, temperature 97.5 F (36.4 C), temperature source Oral, resp. rate 18, height 5' (1.524 m), weight 82.555 kg (182 lb), last menstrual period 07/25/2012, SpO2 100.00%. General appearance: alert and no distress Resp: clear to auscultation bilaterally Cardio: regular rate and rhythm GI: soft, non-tender; bowel sounds normal; no masses,  no organomegaly Pelvic: no vaginal bleeding Extremities: Homans sign is negative, no sign of DVT  Disposition: 01-Home or Self Care Pt given strict instructions not to put anything in vagina.  When I asked her yesterday in the office if she had put anything in the vagina she avoided the question.  Today she says she put 2 tampons in the vagina that she removed.  She denies IC but I'm not sure she is being completely honest with me about that.  Nevertheless I have instructed her not to put anything in there and specifically discussed list of items not to put in there.       Medication List    STOP taking these medications       albuterol 108 (90 BASE) MCG/ACT inhaler  Commonly known as:  PROVENTIL HFA;VENTOLIN HFA      TAKE these medications       cyclobenzaprine 5 MG tablet  Commonly known as:  FLEXERIL  Take 5 mg by mouth 3 (three) times daily as needed for muscle spasms.     ibuprofen 800 MG tablet  Commonly known as:  ADVIL,MOTRIN  Take 1 tablet (800 mg  total) by mouth every 6 (six) hours as needed for pain (For headache.).     oxyCODONE-acetaminophen 5-325 MG per tablet  Commonly known as:  PERCOCET/ROXICET  Take 1-2 tablets by mouth every 6 (six) hours as needed.           Follow-up Information   Follow up with Purcell Nails, MD. (keep appt already scheduled in August)    Contact information:   [address] [place] [address] [phone]       Signed: Purcell Nails 10/17/2012, 8:25 AM

## 2012-10-17 NOTE — Op Note (Signed)
Preop Diagnosis: possible vaginal cuff dehiscense   Postop Diagnosis: vaginal cuff dehiscense   Procedure: EXAM UNDER ANESTHESIA and REPAIR OF VAGINAL CUFF DEHISCENCE  Anesthesia: General   Anesthesiologist: Cristela Blue, MD  Attending: Purcell Nails, MD   Assistant: N/a  Findings: Vaginal cuff dehiscence  Pathology: N/a  Fluids: 2000 cc  UOP: About 150 cc via straight cath prior to procedure  EBL: Minimal  Complications: None  Procedure: The patient was taken to operating room 4 after the r/b/a were discussed with patient and consent signed and witnessed.  The patient was placed under general aneshesia via LMA and weighted speculum placed in the vagina.  Deaver retractors were used for visualization.  The cuff was noted to be opened along the entire suture line.  There was minimal bleeding.  Small amount of ?eschar on posterior cuff was debrided.  Copious irrigation was performed and no communication with the intra-abdominal cavity was noted.  The peritoneum seemed to have healed sufficiently but the vaginal mucosa itself dehisced.  The remaining cuff was repaired with interrupted stitches of 0 PDS after copious irrigation with clindamycin douche.  Sponge, lap and needle count was correct.  Patient tolerated the procedure well and was returned to the recovery room in good condition.

## 2012-10-17 NOTE — Anesthesia Postprocedure Evaluation (Signed)
  Anesthesia Post-op Note  Patient: Yvonne [name]  Procedure(s) Performed: Procedure(s) with comments: EXAM UNDER ANESTHESIA (N/A) - Suturing Vaginal Cuff Patient is awake and responsive. Pain and nausea are reasonably well controlled. Vital signs are stable and clinically acceptable. Oxygen saturation is clinically acceptable. There are no apparent anesthetic complications at this time. Patient is ready for discharge.

## 2012-10-20 ENCOUNTER — Encounter (HOSPITAL_COMMUNITY): Payer: Self-pay | Admitting: Obstetrics and Gynecology

## 2012-11-22 ENCOUNTER — Encounter (HOSPITAL_COMMUNITY): Payer: Self-pay

## 2012-11-22 ENCOUNTER — Emergency Department (HOSPITAL_COMMUNITY): Payer: Medicaid Other

## 2012-11-22 ENCOUNTER — Emergency Department (HOSPITAL_COMMUNITY)
Admission: EM | Admit: 2012-11-22 | Discharge: 2012-11-22 | Disposition: A | Discharge status: Home / Self Care | Payer: Medicaid Other | Attending: Emergency Medicine | Admitting: Emergency Medicine

## 2012-11-22 DIAGNOSIS — J45909 Unspecified asthma, uncomplicated: Secondary | ICD-10-CM | POA: Insufficient documentation

## 2012-11-22 DIAGNOSIS — M549 Dorsalgia, unspecified: Secondary | ICD-10-CM | POA: Insufficient documentation

## 2012-11-22 DIAGNOSIS — R079 Chest pain, unspecified: Secondary | ICD-10-CM | POA: Insufficient documentation

## 2012-11-22 DIAGNOSIS — F17200 Nicotine dependence, unspecified, uncomplicated: Secondary | ICD-10-CM | POA: Insufficient documentation

## 2012-11-22 DIAGNOSIS — M129 Arthropathy, unspecified: Secondary | ICD-10-CM | POA: Insufficient documentation

## 2012-11-22 DIAGNOSIS — F329 Major depressive disorder, single episode, unspecified: Secondary | ICD-10-CM | POA: Insufficient documentation

## 2012-11-22 DIAGNOSIS — F3289 Other specified depressive episodes: Secondary | ICD-10-CM | POA: Insufficient documentation

## 2012-11-22 DIAGNOSIS — G8929 Other chronic pain: Secondary | ICD-10-CM | POA: Insufficient documentation

## 2012-11-22 LAB — CBC
HCT: 39 % (ref 36.0–46.0)
Hemoglobin: 13 g/dL (ref 12.0–15.0)
MCH: 28.3 pg (ref 26.0–34.0)
MCHC: 33.3 g/dL (ref 30.0–36.0)
MCV: 85 fL (ref 78.0–100.0)
Platelets: 331 10*3/uL (ref 150–400)
RBC: 4.59 MIL/uL (ref 3.87–5.11)
RDW: 13.8 % (ref 11.5–15.5)
WBC: 13.7 10*3/uL — ABNORMAL HIGH (ref 4.0–10.5)

## 2012-11-22 LAB — BASIC METABOLIC PANEL
BUN: 7 mg/dL (ref 6–23)
CO2: 21 mEq/L (ref 19–32)
Calcium: 9.5 mg/dL (ref 8.4–10.5)
Chloride: 102 mEq/L (ref 96–112)
Creatinine, Ser: 0.77 mg/dL (ref 0.50–1.10)
GFR calc Af Amer: >90 mL/min (ref >90)
GFR calc non Af Amer: >90 mL/min (ref >90)
Glucose, Bld: 99 mg/dL (ref 70–99)
Potassium: 3.6 mEq/L (ref 3.5–5.1)
Sodium: 136 mEq/L (ref 135–145)

## 2012-11-22 MED ORDER — IBUPROFEN 200 MG PO TABS
600.0000 mg | ORAL_TABLET | Freq: Once | ORAL | Status: DC
  Filled 2012-11-22: qty 3

## 2012-11-22 MED ORDER — LORAZEPAM 1 MG PO TABS: 0.5000 mg | ORAL_TABLET | Freq: Three times a day (TID) | ORAL | Status: DC | PRN

## 2012-11-22 NOTE — ED Notes (Signed)
Pt reports recently having increased stressors in her life such as being a single mother and not being able to sleep.

## 2012-11-22 NOTE — ED Notes (Signed)
Pt states that she has had chest pain for 3 weeks and increasingly got worse today. Pain located at the central chest and on the R side of chest that radiates to her R side of neck. Chest pain accompanied by SOB. Pt speaking in full complete sentences. Denies any cardiac hx.

## 2012-11-27 NOTE — ED Provider Notes (Signed)
CSN: [id number]     Arrival date & time 11/22/12  1930 History   First MD Initiated Contact with Patient 11/22/12 1939     Chief Complaint  Patient presents with  . Chest Pain   (Consider location/radiation/quality/duration/timing/severity/associated sxs/prior Treatment) HPI  27 year old female with chest pain. Gradual onset about 3 weeks ago. Pain is in the right side of her chest and sometimes radiates up into her right neck. It waxes and wanes. No appreciable exacerbating relieving factors. The pain lasts up to several days at a time. She has tried taking ibuprofen with some relief. No fevers or chills. No cough. No shortness of breath. No unusual leg pain or swelling. No rash.  Past Medical History  Diagnosis Date  . Asthma     albuterol inhaler  . Blood transfusion     2008 with c/s 4 units transfused  . Headache(784.0)     otc ibuprofen 800mg   . 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 Depression   . Chronic back pain    Past Surgical History  Procedure Laterality Date  . Appendectomy    . Cesarean section    . Cholecystectomy    . Laparoscopy    . Hand surgery    . Tonsillectomy      T & A  . Tubal ligation    . Endometrial ablation    . Laparoscopic hysterectomy Bilateral 09/29/2012    Procedure: HYSTERECTOMY TOTAL LAPAROSCOPIC;  Surgeon: Purcell Nails, MD;  Location: [facility];  Service: Gynecology;  Laterality: Bilateral;  Total Laparoscopic Hysterectomy, Bilateral Salpingectomies, Cystoscopy poss. LAVH poss. TAH  . Laparoscopic assisted vaginal hysterectomy N/A 09/29/2012    Procedure: LAPAROSCOPIC ASSISTED VAGINAL HYSTERECTOMY;  Surgeon: Purcell Nails, MD;  Location: [facility];  Service: Gynecology;  Laterality: N/A;  . Cystoscopy Bilateral 09/29/2012    Procedure: CYSTOSCOPY;  Surgeon: Purcell Nails, MD;  Location: [facility];  Service: Gynecology;  Laterality: Bilateral;  . Bilateral salpingectomy Bilateral 09/29/2012    Procedure:  BILATERAL SALPINGECTOMY;  Surgeon: Purcell Nails, MD;  Location: [facility];  Service: Gynecology;  Laterality: Bilateral;  . Abdominal hysterectomy N/A 09/29/2012    Procedure: HYSTERECTOMY ABDOMINAL;  Surgeon: Purcell Nails, MD;  Location: [facility];  Service: Gynecology;  Laterality: N/A;  . Examination under anesthesia N/A 10/16/2012    Procedure: EXAM UNDER ANESTHESIA;  Surgeon: Purcell Nails, MD;  Location: [facility];  Service: Gynecology;  Laterality: N/A;  Suturing Vaginal Cuff   Family History  Problem Relation Age of Onset  . Diabetes Mother   . Diabetes Maternal Grandmother    History  Substance Use Topics  . Smoking status: Current Every Day Smoker -- 0.50 packs/day    Types: Cigarettes  . Smokeless tobacco: Never Used  . Alcohol Use: 0.6 oz/week    1 Glasses of wine per week     Comment: ocassionally   OB History   Grav Para Term Preterm Abortions TAB SAB Ect Mult Living   4 4 4       1      Review of Systems  All systems reviewed and negative, other than as noted in HPI.   Allergies  Reglan; Aspirin; and Bee venom  Home Medications   Current Outpatient Rx  Name  Route  Sig  Dispense  Refill  . ibuprofen (ADVIL,MOTRIN) 200 MG tablet   Oral   Take 400 mg by mouth every 6 (  six) hours as needed for pain.         Marland Kitchen LORazepam (ATIVAN) 1 MG tablet   Oral   Take 0.5 tablets (0.5 mg total) by mouth every 8 (eight) hours as needed for anxiety.   15 tablet   0    BP 120/73  Pulse 67  Temp(Src) 98.1 F (36.7 C) (Oral)  Resp 16  Ht 5' (1.524 m)  SpO2 100%  LMP 07/25/2012 Physical Exam  Nursing note and vitals reviewed. Constitutional: She appears well-developed and well-nourished. No distress.  HENT:  Head: Normocephalic and atraumatic.  Eyes: Conjunctivae are normal. Right eye exhibits no discharge. Left eye exhibits no discharge.  Neck: Neck supple.  Cardiovascular: Normal rate, regular rhythm and normal heart sounds.  Exam reveals no gallop and no  friction rub.   No murmur heard. Pulmonary/Chest: Effort normal and breath sounds normal. No respiratory distress.  Abdominal: Soft. She exhibits no distension. There is no tenderness.  Musculoskeletal: She exhibits no edema and no tenderness.  Lower extremities symmetric as compared to each other. No calf tenderness. Negative Homan's. No palpable cords.   Neurological: She is alert.  Skin: Skin is warm and dry.  Psychiatric: She has a normal mood and affect. Her behavior is normal. Thought content normal.    ED Course  Procedures (including critical care time) Labs Review Labs Reviewed  CBC - Abnormal; Notable for the following:    WBC 13.7 (*)    All other components within normal limits  BASIC METABOLIC PANEL  POCT I-STAT TROPONIN I  EKG:  Rhythm: Sinus arrhythmia Vent. rate 87 BPM PR interval 144 ms QRS duration 76 ms QT/QTc 344/414 ms ST segments: Normal  Imaging Review No results found.  MDM   1. Chest pain    27 year old female with chest pain. Doubt ACS, infectious, pulmonary embolism or dissection. Workup today is. Urine. Patient is afebrile and hemodynamically stable. Oxygen saturations are in the high 90s to 100% on room air. She has no increased work of breathing. I suspect that much of her symptoms are related to life stressors. Patient is a single mother. Sounds like she works extended hours at her job. Very low suspicion for emergent etiology of her presenting complaints. Patient is requesting medication for sleep. Will provide a short course of benzodiazepines as needed for anxiety and sleep. Patient denies any suicidal or homicidal ideation. She is exhibiting no signs of psychosis. The patient is safe for discharge at this time. Return precautions discussed.    Raeford Razor, MD 11/27/12 [phone]

## 2012-12-06 ENCOUNTER — Encounter (HOSPITAL_COMMUNITY): Payer: Self-pay | Admitting: Emergency Medicine

## 2012-12-06 ENCOUNTER — Emergency Department (HOSPITAL_COMMUNITY)
Admission: EM | Admit: 2012-12-06 | Discharge: 2012-12-06 | Disposition: A | Discharge status: Home / Self Care | Payer: Medicaid Other | Attending: Emergency Medicine | Admitting: Emergency Medicine

## 2012-12-06 ENCOUNTER — Emergency Department (HOSPITAL_COMMUNITY): Payer: Medicaid Other

## 2012-12-06 DIAGNOSIS — R296 Repeated falls: Secondary | ICD-10-CM | POA: Insufficient documentation

## 2012-12-06 DIAGNOSIS — Z8739 Personal history of other diseases of the musculoskeletal system and connective tissue: Secondary | ICD-10-CM | POA: Insufficient documentation

## 2012-12-06 DIAGNOSIS — M549 Dorsalgia, unspecified: Secondary | ICD-10-CM | POA: Insufficient documentation

## 2012-12-06 DIAGNOSIS — S6990XA Unspecified injury of unspecified wrist, hand and finger(s), initial encounter: Secondary | ICD-10-CM | POA: Insufficient documentation

## 2012-12-06 DIAGNOSIS — Z8659 Personal history of other mental and behavioral disorders: Secondary | ICD-10-CM | POA: Insufficient documentation

## 2012-12-06 DIAGNOSIS — Y9339 Activity, other involving climbing, rappelling and jumping off: Secondary | ICD-10-CM | POA: Insufficient documentation

## 2012-12-06 DIAGNOSIS — F17200 Nicotine dependence, unspecified, uncomplicated: Secondary | ICD-10-CM | POA: Insufficient documentation

## 2012-12-06 DIAGNOSIS — M79641 Pain in right hand: Secondary | ICD-10-CM

## 2012-12-06 DIAGNOSIS — Z9889 Other specified postprocedural states: Secondary | ICD-10-CM | POA: Insufficient documentation

## 2012-12-06 DIAGNOSIS — Y929 Unspecified place or not applicable: Secondary | ICD-10-CM | POA: Insufficient documentation

## 2012-12-06 DIAGNOSIS — G8929 Other chronic pain: Secondary | ICD-10-CM | POA: Insufficient documentation

## 2012-12-06 DIAGNOSIS — S0990XA Unspecified injury of head, initial encounter: Secondary | ICD-10-CM | POA: Insufficient documentation

## 2012-12-06 DIAGNOSIS — J45909 Unspecified asthma, uncomplicated: Secondary | ICD-10-CM | POA: Insufficient documentation

## 2012-12-06 MED ORDER — TRAMADOL HCL 50 MG PO TABS: 50.0000 mg | ORAL_TABLET | Freq: Four times a day (QID) | ORAL | Status: DC | PRN
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
## 2012-12-06 NOTE — ED Provider Notes (Signed)
Medical screening examination/treatment/procedure(s) were performed by non-physician practitioner and as supervising physician I was immediately available for consultation/collaboration.     Amor Hyle R Anjulie Dipierro, MD 12/06/12 2358 
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
## 2012-12-22 ENCOUNTER — Emergency Department (HOSPITAL_COMMUNITY)
Admission: EM | Admit: 2012-12-22 | Discharge: 2012-12-22 | Disposition: A | Discharge status: Home / Self Care | Payer: Medicaid Other | Attending: Emergency Medicine | Admitting: Emergency Medicine

## 2012-12-22 ENCOUNTER — Encounter (HOSPITAL_COMMUNITY): Payer: Self-pay | Admitting: Emergency Medicine

## 2012-12-22 DIAGNOSIS — J45909 Unspecified asthma, uncomplicated: Secondary | ICD-10-CM | POA: Insufficient documentation

## 2012-12-22 DIAGNOSIS — M79641 Pain in right hand: Secondary | ICD-10-CM

## 2012-12-22 DIAGNOSIS — F329 Major depressive disorder, single episode, unspecified: Secondary | ICD-10-CM | POA: Insufficient documentation

## 2012-12-22 DIAGNOSIS — G8929 Other chronic pain: Secondary | ICD-10-CM | POA: Insufficient documentation

## 2012-12-22 DIAGNOSIS — Z9889 Other specified postprocedural states: Secondary | ICD-10-CM | POA: Insufficient documentation

## 2012-12-22 DIAGNOSIS — F17200 Nicotine dependence, unspecified, uncomplicated: Secondary | ICD-10-CM | POA: Insufficient documentation

## 2012-12-22 DIAGNOSIS — Z79899 Other long term (current) drug therapy: Secondary | ICD-10-CM | POA: Insufficient documentation

## 2012-12-22 DIAGNOSIS — Z8739 Personal history of other diseases of the musculoskeletal system and connective tissue: Secondary | ICD-10-CM | POA: Insufficient documentation

## 2012-12-22 DIAGNOSIS — F3289 Other specified depressive episodes: Secondary | ICD-10-CM | POA: Insufficient documentation

## 2012-12-22 DIAGNOSIS — M79609 Pain in unspecified limb: Secondary | ICD-10-CM | POA: Insufficient documentation

## 2012-12-22 MED ORDER — TRAMADOL HCL 50 MG PO TABS: 50.0000 mg | ORAL_TABLET | Freq: Four times a day (QID) | ORAL | Status: DC | PRN
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
## 2012-12-23 NOTE — ED Provider Notes (Signed)
Medical screening examination/treatment/procedure(s) were performed by non-physician practitioner and as supervising physician I was immediately available for consultation/collaboration.  Bennet Kujawa T Egbert Seidel, MD 12/23/12 0709 

## 2013-01-13 ENCOUNTER — Emergency Department (HOSPITAL_COMMUNITY)
Admission: EM | Admit: 2013-01-13 | Discharge: 2013-01-13 | Discharge status: Against Medical Advice | Payer: Medicaid Other | Source: Home / Self Care

## 2013-01-13 ENCOUNTER — Emergency Department (HOSPITAL_COMMUNITY): Payer: Medicaid Other

## 2013-01-13 ENCOUNTER — Emergency Department (HOSPITAL_COMMUNITY)
Admission: EM | Admit: 2013-01-13 | Discharge: 2013-01-14 | Disposition: A | Discharge status: Home / Self Care | Payer: Medicaid Other | Attending: Emergency Medicine | Admitting: Emergency Medicine

## 2013-01-13 ENCOUNTER — Encounter (HOSPITAL_COMMUNITY): Payer: Self-pay | Admitting: Emergency Medicine

## 2013-01-13 DIAGNOSIS — F3289 Other specified depressive episodes: Secondary | ICD-10-CM | POA: Insufficient documentation

## 2013-01-13 DIAGNOSIS — J45909 Unspecified asthma, uncomplicated: Secondary | ICD-10-CM | POA: Insufficient documentation

## 2013-01-13 DIAGNOSIS — M129 Arthropathy, unspecified: Secondary | ICD-10-CM | POA: Insufficient documentation

## 2013-01-13 DIAGNOSIS — Z79899 Other long term (current) drug therapy: Secondary | ICD-10-CM | POA: Insufficient documentation

## 2013-01-13 DIAGNOSIS — M549 Dorsalgia, unspecified: Secondary | ICD-10-CM | POA: Insufficient documentation

## 2013-01-13 DIAGNOSIS — Z5189 Encounter for other specified aftercare: Secondary | ICD-10-CM | POA: Insufficient documentation

## 2013-01-13 DIAGNOSIS — R1084 Generalized abdominal pain: Secondary | ICD-10-CM | POA: Insufficient documentation

## 2013-01-13 DIAGNOSIS — K5900 Constipation, unspecified: Secondary | ICD-10-CM

## 2013-01-13 DIAGNOSIS — F17200 Nicotine dependence, unspecified, uncomplicated: Secondary | ICD-10-CM | POA: Insufficient documentation

## 2013-01-13 DIAGNOSIS — Z888 Allergy status to other drugs, medicaments and biological substances status: Secondary | ICD-10-CM | POA: Insufficient documentation

## 2013-01-13 DIAGNOSIS — G8929 Other chronic pain: Secondary | ICD-10-CM | POA: Insufficient documentation

## 2013-01-13 DIAGNOSIS — F329 Major depressive disorder, single episode, unspecified: Secondary | ICD-10-CM | POA: Insufficient documentation

## 2013-01-13 DIAGNOSIS — K5641 Fecal impaction: Secondary | ICD-10-CM | POA: Insufficient documentation

## 2013-01-13 DIAGNOSIS — Z8669 Personal history of other diseases of the nervous system and sense organs: Secondary | ICD-10-CM | POA: Insufficient documentation

## 2013-01-13 MED ORDER — POLYETHYLENE GLYCOL 3350 17 G PO PACK: 17.0000 g | PACK | Freq: Every day | ORAL | Status: DC

## 2013-01-13 MED ORDER — FLEET ENEMA 7-19 GM/118ML RE ENEM
1.0000 | ENEMA | Freq: Once | RECTAL | Status: AC
  Administered 2013-01-13: 1 via RECTAL
  Filled 2013-01-13: qty 1

## 2013-01-13 MED ORDER — POLYETHYLENE GLYCOL 3350 17 G PO PACK
17.0000 g | PACK | Freq: Every day | ORAL | Status: DC
  Administered 2013-01-13: 17 g via ORAL
  Filled 2013-01-13: qty 1

## 2013-01-13 MED ORDER — HYDROCODONE-ACETAMINOPHEN 5-325 MG PO TABS
2.0000 | ORAL_TABLET | Freq: Once | ORAL | Status: AC
  Administered 2013-01-13: 2 via ORAL
  Filled 2013-01-13: qty 2

## 2013-01-13 NOTE — ED Provider Notes (Signed)
CSN: [id number]     Arrival date & time 01/13/13  1849 History   First MD Initiated Contact with Patient 01/13/13 2147     Chief Complaint  Patient presents with  . Constipation   (Consider location/radiation/quality/duration/timing/severity/associated sxs/prior Treatment) HPI Comments: 27 yo female with back pain and dental pain, recent narcotics and presents with hard stools and pain with attempt to deficate.  Mild abd cramping.  No bleeding.  No vomiting or fevers.   Patient is a 27 y.o. female presenting with constipation. The history is provided by the patient.  Constipation Associated symptoms: abdominal pain   Associated symptoms: no back pain, no diarrhea, no fever and no vomiting     Past Medical History  Diagnosis Date  . Asthma     albuterol inhaler  . Blood transfusion     2008 with c/s 4 units transfused  . Headache(784.0)     otc ibuprofen 800mg   . 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 Depression   . Chronic back pain    Past Surgical History  Procedure Laterality Date  . Appendectomy    . Cesarean section    . Cholecystectomy    . Laparoscopy    . Hand surgery    . Tonsillectomy      T & A  . Tubal ligation    . Endometrial ablation    . Laparoscopic hysterectomy Bilateral 09/29/2012    Procedure: HYSTERECTOMY TOTAL LAPAROSCOPIC;  Surgeon: Purcell Nails, MD;  Location: [facility];  Service: Gynecology;  Laterality: Bilateral;  Total Laparoscopic Hysterectomy, Bilateral Salpingectomies, Cystoscopy poss. LAVH poss. TAH  . Laparoscopic assisted vaginal hysterectomy N/A 09/29/2012    Procedure: LAPAROSCOPIC ASSISTED VAGINAL HYSTERECTOMY;  Surgeon: Purcell Nails, MD;  Location: [facility];  Service: Gynecology;  Laterality: N/A;  . Cystoscopy Bilateral 09/29/2012    Procedure: CYSTOSCOPY;  Surgeon: Purcell Nails, MD;  Location: [facility];  Service: Gynecology;  Laterality: Bilateral;  . Bilateral salpingectomy Bilateral 09/29/2012   Procedure: BILATERAL SALPINGECTOMY;  Surgeon: Purcell Nails, MD;  Location: [facility];  Service: Gynecology;  Laterality: Bilateral;  . Examination under anesthesia N/A 10/16/2012    Procedure: EXAM UNDER ANESTHESIA;  Surgeon: Purcell Nails, MD;  Location: [facility];  Service: Gynecology;  Laterality: N/A;  Suturing Vaginal Cuff  . Abdominal hysterectomy N/A 09/29/2012    Procedure: HYSTERECTOMY ABDOMINAL;  Surgeon: Purcell Nails, MD;  Location: [facility];  Service: Gynecology;  Laterality: N/A;   Family History  Problem Relation Age of Onset  . Diabetes Mother   . Diabetes Maternal Grandmother    History  Substance Use Topics  . Smoking status: Current Every Day Smoker -- 0.50 packs/day    Types: Cigarettes  . Smokeless tobacco: Never Used  . Alcohol Use: 0.6 oz/week    1 Glasses of wine per week     Comment: ocassionally   OB History   Grav Para Term Preterm Abortions TAB SAB Ect Mult Living   4 4 4       1      Review of Systems  Constitutional: Negative for fever and chills.  Respiratory: Negative for shortness of breath.   Cardiovascular: Negative for chest pain.  Gastrointestinal: Positive for abdominal pain and constipation. Negative for vomiting and diarrhea.  Genitourinary: Negative for flank pain.  Musculoskeletal: Negative for back pain.    Allergies  Reglan; Aspirin; and Bee venom  Home Medications  Current Outpatient Rx  Name  Route  Sig  Dispense  Refill  . acetaminophen (TYLENOL) 500 MG tablet   Oral   Take 500 mg by mouth every 6 (six) hours as needed for pain.         Marland Kitchen albuterol (PROVENTIL HFA;VENTOLIN HFA) 108 (90 BASE) MCG/ACT inhaler   Inhalation   Inhale 2 puffs into the lungs every 4 (four) hours as needed for shortness of breath.         Marland Kitchen ibuprofen (ADVIL,MOTRIN) 800 MG tablet   Oral   Take 800 mg by mouth every 6 (six) hours as needed for pain.         Marland Kitchen LORazepam (ATIVAN) 1 MG tablet   Oral   Take 0.5 tablets (0.5 mg total) by  mouth every 8 (eight) hours as needed for anxiety.   15 tablet   0   . PRESCRIPTION MEDICATION   Oral   Take 1 tablet by mouth at bedtime. Depression medication.         . polyethylene glycol (MIRALAX / GLYCOLAX) packet   Oral   Take 17 g by mouth daily.   14 each   0    BP 117/61  Pulse 90  Temp(Src) 97.9 F (36.6 C) (Oral)  Resp 24  Ht 5' (1.524 m)  Wt 175 lb (79.379 kg)  BMI 34.18 kg/m2  SpO2 100%  LMP 07/25/2012 Physical Exam  Nursing note and vitals reviewed. Constitutional: She is oriented to person, place, and time. She appears well-developed and well-nourished.  HENT:  Head: Normocephalic and atraumatic.  Eyes: Conjunctivae are normal. Right eye exhibits no discharge. Left eye exhibits no discharge.  Neck: Normal range of motion. Neck supple. No tracheal deviation present.  Cardiovascular: Normal rate and regular rhythm.   Pulmonary/Chest: Effort normal and breath sounds normal.  Abdominal: Soft. She exhibits no distension. There is tenderness (diffuse mild). There is no guarding.  Genitourinary:  Fecal impaction, no bleeding, firm stool  Musculoskeletal: She exhibits no edema.  Neurological: She is alert and oriented to person, place, and time.  Skin: Skin is warm. No rash noted.  Psychiatric: She has a normal mood and affect.    ED Course  Procedures (including critical care time) Labs Review Labs Reviewed - No data to display Imaging Review Dg Abd 1 View  01/13/2013   CLINICAL DATA:  Constipation  EXAM: ABDOMEN - 1 VIEW  COMPARISON:  None.  FINDINGS: The bowel gas pattern is normal. No radio-opaque calculi or other significant radiographic abnormality are seen. Increased stool burden in the rectum.  IMPRESSION: Findings consistent with fecal impaction. No bowel obstruction or acute intra-abdominal findings.   Electronically Signed   By: Davonna Belling M.D.   On: 01/13/2013 22:44    EKG Interpretation   None       MDM   1. Constipation   2.  Fecal impaction    Manual disimpaction partial successful followed by miralax and enema. Pt had large BM, feels improved. Discussed risks of narcotics and close fup outpt.   Constipation, Abdo pain    Enid Skeens, MD 01/16/13 [phone]

## 2013-01-13 NOTE — ED Notes (Signed)
Called name x 3. No answer.

## 2013-01-13 NOTE — ED Notes (Signed)
Pt reports last bm was Sunday night. No relief with otc meds, having pelvic pain and rectal bleeding.

## 2013-01-13 NOTE — ED Notes (Addendum)
Pt states she has been constipated for 2 days, has tried mag citrate, manual disimpaction, drinking milk and sugar.  Pt states she sees bright red blood when she tries to move her bowels.  Pt had full hysterectomy September 29, 2012. Pt tearful, guarding abd.

## 2013-01-13 NOTE — ED Notes (Signed)
Enema/Miralax held until after Xray for pt comfort

## 2013-07-12 ENCOUNTER — Encounter (HOSPITAL_COMMUNITY): Payer: Self-pay | Admitting: Emergency Medicine

## 2013-07-12 ENCOUNTER — Emergency Department (HOSPITAL_COMMUNITY)
Admission: EM | Admit: 2013-07-12 | Discharge: 2013-07-12 | Disposition: A | Discharge status: Home / Self Care | Payer: Medicaid Other | Attending: Emergency Medicine | Admitting: Emergency Medicine

## 2013-07-12 DIAGNOSIS — R519 Headache, unspecified: Secondary | ICD-10-CM

## 2013-07-12 DIAGNOSIS — Z8659 Personal history of other mental and behavioral disorders: Secondary | ICD-10-CM | POA: Insufficient documentation

## 2013-07-12 DIAGNOSIS — M129 Arthropathy, unspecified: Secondary | ICD-10-CM | POA: Insufficient documentation

## 2013-07-12 DIAGNOSIS — J45909 Unspecified asthma, uncomplicated: Secondary | ICD-10-CM | POA: Insufficient documentation

## 2013-07-12 DIAGNOSIS — G8929 Other chronic pain: Secondary | ICD-10-CM | POA: Insufficient documentation

## 2013-07-12 DIAGNOSIS — R51 Headache: Secondary | ICD-10-CM | POA: Insufficient documentation

## 2013-07-12 DIAGNOSIS — H53149 Visual discomfort, unspecified: Secondary | ICD-10-CM | POA: Insufficient documentation

## 2013-07-12 DIAGNOSIS — J329 Chronic sinusitis, unspecified: Secondary | ICD-10-CM

## 2013-07-12 DIAGNOSIS — R110 Nausea: Secondary | ICD-10-CM | POA: Insufficient documentation

## 2013-07-12 DIAGNOSIS — Z79899 Other long term (current) drug therapy: Secondary | ICD-10-CM | POA: Insufficient documentation

## 2013-07-12 DIAGNOSIS — F17200 Nicotine dependence, unspecified, uncomplicated: Secondary | ICD-10-CM | POA: Insufficient documentation

## 2013-07-12 MED ORDER — KETOROLAC TROMETHAMINE 30 MG/ML IJ SOLN
30.0000 mg | Freq: Once | INTRAMUSCULAR | Status: AC
  Administered 2013-07-12: 30 mg via INTRAVENOUS
  Filled 2013-07-12: qty 1

## 2013-07-12 MED ORDER — SODIUM CHLORIDE 0.9 % IV BOLUS (SEPSIS)
1000.0000 mL | Freq: Once | INTRAVENOUS | Status: AC
  Administered 2013-07-12: 1000 mL via INTRAVENOUS

## 2013-07-12 MED ORDER — PROMETHAZINE HCL 25 MG/ML IJ SOLN
25.0000 mg | Freq: Once | INTRAMUSCULAR | Status: AC
  Administered 2013-07-12: 25 mg via INTRAVENOUS
  Filled 2013-07-12: qty 1

## 2013-07-12 MED ORDER — AMOXICILLIN 500 MG PO CAPS: 500.0000 mg | ORAL_CAPSULE | Freq: Three times a day (TID) | ORAL | Status: DC

## 2013-07-12 MED ORDER — DIPHENHYDRAMINE HCL 50 MG/ML IJ SOLN
25.0000 mg | Freq: Once | INTRAMUSCULAR | Status: AC
  Administered 2013-07-12: 25 mg via INTRAVENOUS
  Filled 2013-07-12: qty 1

## 2013-07-12 NOTE — ED Notes (Signed)
Pt alert and oriented x4. Respirations even and unlabored, bilateral symmetrical rise and fall of chest. Skin warm and dry. In no acute distress. Denies needs.   

## 2013-07-12 NOTE — ED Provider Notes (Signed)
Medical screening examination/treatment/procedure(s) were performed by non-physician practitioner and as supervising physician I was immediately available for consultation/collaboration.   EKG Interpretation None        Layla MawKristen N Ward, DO 07/12/13 1730

## 2013-07-12 NOTE — ED Notes (Signed)
She tells me she is having a migraine h/a with nausea.  She has had migraine h/a before, but "never this bad".  She is in no distress.

## 2013-07-12 NOTE — ED Provider Notes (Signed)
CSN: [id number]     Arrival date & time 07/12/13  1019 History   First MD Initiated Contact with Patient 07/12/13 1029     Chief Complaint  Patient presents with  . Migraine     (Consider location/radiation/quality/duration/timing/severity/associated sxs/prior Treatment) HPI Comments: Patient is a 28 year old female with a past medical history of chronic migraines who presents with a headache for 1 week. Patient reports a gradual onset and progressive worsening of the headache. The pain is sharp, constant and is located in generalized head without radiation. Patient has tried ibuprofen for symptoms with relief. No alleviating/aggravating factors. Patient reports associated nausea and photophobia. Patient denies fever, vomiting, diarrhea, numbness/tingling, weakness, visual changes, congestion, chest pain, SOB, abdominal pain.     Patient is a 28 y.o. female presenting with migraines.  Migraine Associated symptoms include headaches and nausea. Pertinent negatives include no abdominal pain, arthralgias, chest pain, chills, fatigue, fever, neck pain, vomiting or weakness.    Past Medical History  Diagnosis Date  . Asthma     albuterol inhaler  . Blood transfusion     2008 with c/s 4 units transfused  . Headache(784.0)     otc ibuprofen 800mg   . 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 Depression   . Chronic back pain    Past Surgical History  Procedure Laterality Date  . Appendectomy    . Cesarean section    . Cholecystectomy    . Laparoscopy    . Hand surgery    . Tonsillectomy      T & A  . Tubal ligation    . Endometrial ablation    . Laparoscopic hysterectomy Bilateral 09/29/2012    Procedure: HYSTERECTOMY TOTAL LAPAROSCOPIC;  Surgeon: Purcell NailsAngela Y Roberts, MD;  Location: [facility];  Service: Gynecology;  Laterality: Bilateral;  Total Laparoscopic Hysterectomy, Bilateral Salpingectomies, Cystoscopy poss. LAVH poss. TAH  . Laparoscopic assisted vaginal  hysterectomy N/A 09/29/2012    Procedure: LAPAROSCOPIC ASSISTED VAGINAL HYSTERECTOMY;  Surgeon: Purcell NailsAngela Y Roberts, MD;  Location: [facility];  Service: Gynecology;  Laterality: N/A;  . Cystoscopy Bilateral 09/29/2012    Procedure: CYSTOSCOPY;  Surgeon: Purcell NailsAngela Y Roberts, MD;  Location: [facility];  Service: Gynecology;  Laterality: Bilateral;  . Bilateral salpingectomy Bilateral 09/29/2012    Procedure: BILATERAL SALPINGECTOMY;  Surgeon: Purcell NailsAngela Y Roberts, MD;  Location: [facility];  Service: Gynecology;  Laterality: Bilateral;  . Examination under anesthesia N/A 10/16/2012    Procedure: EXAM UNDER ANESTHESIA;  Surgeon: Purcell NailsAngela Y Roberts, MD;  Location: [facility];  Service: Gynecology;  Laterality: N/A;  Suturing Vaginal Cuff  . Abdominal hysterectomy N/A 09/29/2012    Procedure: HYSTERECTOMY ABDOMINAL;  Surgeon: Purcell NailsAngela Y Roberts, MD;  Location: [facility];  Service: Gynecology;  Laterality: N/A;   Family History  Problem Relation Age of Onset  . Diabetes Mother   . Diabetes Maternal Grandmother    History  Substance Use Topics  . Smoking status: Current Every Day Smoker -- 0.50 packs/day    Types: Cigarettes  . Smokeless tobacco: Never Used  . Alcohol Use: 0.6 oz/week    1 Glasses of wine per week     Comment: ocassionally   OB History   Grav Para Term Preterm Abortions TAB SAB Ect Mult Living   4 4 4       1      Review of Systems  Constitutional: Negative for fever, chills and fatigue.  HENT: Negative  for trouble swallowing.   Eyes: Negative for visual disturbance.  Respiratory: Negative for shortness of breath.   Cardiovascular: Negative for chest pain and palpitations.  Gastrointestinal: Positive for nausea. Negative for vomiting, abdominal pain and diarrhea.  Genitourinary: Negative for dysuria and difficulty urinating.  Musculoskeletal: Negative for arthralgias and neck pain.  Skin: Negative for color change.  Neurological: Positive for headaches. Negative for dizziness and weakness.   Psychiatric/Behavioral: Negative for dysphoric mood.      Allergies  Reglan; Aspirin; and Bee venom  Home Medications   Current Outpatient Rx  Name  Route  Sig  Dispense  Refill  . acetaminophen (TYLENOL) 500 MG tablet   Oral   Take 500 mg by mouth every 6 (six) hours as needed for pain.         Marland Kitchen albuterol (PROVENTIL HFA;VENTOLIN HFA) 108 (90 BASE) MCG/ACT inhaler   Inhalation   Inhale 2 puffs into the lungs every 4 (four) hours as needed for shortness of breath.         Marland Kitchen ibuprofen (ADVIL,MOTRIN) 800 MG tablet   Oral   Take 800 mg by mouth every 6 (six) hours as needed for pain.         Marland Kitchen LORazepam (ATIVAN) 1 MG tablet   Oral   Take 0.5 tablets (0.5 mg total) by mouth every 8 (eight) hours as needed for anxiety.   15 tablet   0   . polyethylene glycol (MIRALAX / GLYCOLAX) packet   Oral   Take 17 g by mouth daily.   14 each   0   . PRESCRIPTION MEDICATION   Oral   Take 1 tablet by mouth at bedtime. Depression medication.          BP 125/73  Pulse 73  Temp(Src) 98.3 F (36.8 C) (Oral)  Resp 16  SpO2 100%  LMP 07/25/2012 Physical Exam  Nursing note and vitals reviewed. Constitutional: She is oriented to person, place, and time. She appears well-developed and well-nourished. No distress.  HENT:  Head: Normocephalic and atraumatic.  Mouth/Throat: Oropharynx is clear and moist. No oropharyngeal exudate.  Maxillary and sinus tenderness to palpation.   Eyes: Conjunctivae and EOM are normal. Pupils are equal, round, and reactive to light.  Neck: Normal range of motion.  Cardiovascular: Normal rate and regular rhythm.  Exam reveals no gallop and no friction rub.   No murmur heard. Pulmonary/Chest: Effort normal and breath sounds normal. She has no wheezes. She has no rales. She exhibits no tenderness.  Abdominal: Soft. She exhibits no distension. There is no tenderness. There is no rebound and no guarding.  Musculoskeletal: Normal range of motion.   Neurological: She is alert and oriented to person, place, and time. Coordination normal.  Speech is goal-oriented. Moves limbs without ataxia.   Skin: Skin is warm and dry.  Psychiatric: She has a normal mood and affect. Her behavior is normal.    ED Course  Procedures (including critical care time) Labs Review Labs Reviewed - No data to display Imaging Review No results found.   EKG Interpretation None      MDM   Final diagnoses:  Headache  Sinusitis   10:44 AM Patient will have IV toradol, benadryl, and phenergan for her migraine. Vitals stable and patient afebrile.   Patient's headache improved with migraine cocktail. Patient will be discharged with amoxicillin for sinusitis. Vitals stable and patient afebrile.   Emilia Beck, PA-C 07/12/13 1414

## 2013-07-12 NOTE — Discharge Instructions (Signed)
Take amoxicillin as directed until gone. Refer to attached documents for more information. Follow up with your doctor for further evaluation as needed.  °

## 2013-07-15 ENCOUNTER — Encounter (HOSPITAL_COMMUNITY): Payer: Self-pay | Admitting: Emergency Medicine

## 2013-07-15 ENCOUNTER — Emergency Department (HOSPITAL_COMMUNITY)
Admission: EM | Admit: 2013-07-15 | Discharge: 2013-07-15 | Disposition: A | Discharge status: Home / Self Care | Payer: Medicaid Other | Attending: Emergency Medicine | Admitting: Emergency Medicine

## 2013-07-15 DIAGNOSIS — Z79899 Other long term (current) drug therapy: Secondary | ICD-10-CM | POA: Insufficient documentation

## 2013-07-15 DIAGNOSIS — F17200 Nicotine dependence, unspecified, uncomplicated: Secondary | ICD-10-CM | POA: Insufficient documentation

## 2013-07-15 DIAGNOSIS — M129 Arthropathy, unspecified: Secondary | ICD-10-CM | POA: Insufficient documentation

## 2013-07-15 DIAGNOSIS — G43909 Migraine, unspecified, not intractable, without status migrainosus: Secondary | ICD-10-CM | POA: Insufficient documentation

## 2013-07-15 DIAGNOSIS — J45909 Unspecified asthma, uncomplicated: Secondary | ICD-10-CM | POA: Insufficient documentation

## 2013-07-15 DIAGNOSIS — Z792 Long term (current) use of antibiotics: Secondary | ICD-10-CM | POA: Insufficient documentation

## 2013-07-15 DIAGNOSIS — F3289 Other specified depressive episodes: Secondary | ICD-10-CM | POA: Insufficient documentation

## 2013-07-15 DIAGNOSIS — G8929 Other chronic pain: Secondary | ICD-10-CM | POA: Insufficient documentation

## 2013-07-15 DIAGNOSIS — F329 Major depressive disorder, single episode, unspecified: Secondary | ICD-10-CM | POA: Insufficient documentation

## 2013-07-15 MED ORDER — DIPHENHYDRAMINE HCL 50 MG/ML IJ SOLN
25.0000 mg | Freq: Once | INTRAMUSCULAR | Status: AC
  Administered 2013-07-15: 25 mg via INTRAVENOUS
  Filled 2013-07-15: qty 1

## 2013-07-15 MED ORDER — SODIUM CHLORIDE 0.9 % IV BOLUS (SEPSIS)
2000.0000 mL | Freq: Once | INTRAVENOUS | Status: AC
  Administered 2013-07-15: 2000 mL via INTRAVENOUS

## 2013-07-15 MED ORDER — KETOROLAC TROMETHAMINE 30 MG/ML IJ SOLN
30.0000 mg | Freq: Once | INTRAMUSCULAR | Status: AC
  Administered 2013-07-15: 30 mg via INTRAVENOUS
  Filled 2013-07-15: qty 1

## 2013-07-15 MED ORDER — PROCHLORPERAZINE EDISYLATE 5 MG/ML IJ SOLN: 10.0000 mg | Freq: Once | INTRAMUSCULAR | Status: DC

## 2013-07-15 MED ORDER — DEXAMETHASONE SODIUM PHOSPHATE 10 MG/ML IJ SOLN
10.0000 mg | Freq: Once | INTRAMUSCULAR | Status: AC
  Administered 2013-07-15: 10 mg via INTRAVENOUS
  Filled 2013-07-15: qty 1

## 2013-07-15 MED ORDER — FENTANYL CITRATE 0.05 MG/ML IJ SOLN
100.0000 ug | Freq: Once | INTRAMUSCULAR | Status: AC
  Administered 2013-07-15: 100 ug via INTRAVENOUS
  Filled 2013-07-15: qty 2

## 2013-07-15 NOTE — ED Notes (Signed)
Pt states that she was here 2 days ago for migraine and was "given a martini" but it still has not gone away.

## 2013-07-15 NOTE — ED Notes (Signed)
Pt escorted to discharge window. Pt verbalized understanding discharge instructions. In no acute distress.  

## 2013-07-15 NOTE — Discharge Instructions (Signed)
Return here as needed.  Follow-up with the neurologist provided.  Increase your fluid intake °

## 2013-07-15 NOTE — ED Provider Notes (Signed)
CSN: [id number]     Arrival date & time 07/15/13  1459 History   First MD Initiated Contact with Patient 07/15/13 1516     Chief Complaint  Patient presents with  . Migraine     (Consider location/radiation/quality/duration/timing/severity/associated sxs/prior Treatment) HPI Patient presents to the emergency department with migraine headache for the last 2 days.  Patient, states she's here on the 12th of similar symptoms and states, that her headache, never completely went away.  The patient denies chest pain, shortness of breath, nausea, vomiting, or vision, weakness, numbness, dizziness, back pain, neck pain, fever, cough, sore throat, or syncope.  Patient, states she did not take any medications prior to arrival other than 800 mg of ibuprofen.  Patient, states she got minimal relief with this he should states she has not seen anybody for her migraine headaches Past Medical History  Diagnosis Date  . Asthma     albuterol inhaler  . Blood transfusion     2008 with c/s 4 units transfused  . Headache(784.0)     otc ibuprofen 800mg   . 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 Depression   . Chronic back pain    Past Surgical History  Procedure Laterality Date  . Appendectomy    . Cesarean section    . Cholecystectomy    . Laparoscopy    . Hand surgery    . Tonsillectomy      T & A  . Tubal ligation    . Endometrial ablation    . Laparoscopic hysterectomy Bilateral 09/29/2012    Procedure: HYSTERECTOMY TOTAL LAPAROSCOPIC;  Surgeon: Purcell NailsAngela Y Roberts, MD;  Location: [facility];  Service: Gynecology;  Laterality: Bilateral;  Total Laparoscopic Hysterectomy, Bilateral Salpingectomies, Cystoscopy poss. LAVH poss. TAH  . Laparoscopic assisted vaginal hysterectomy N/A 09/29/2012    Procedure: LAPAROSCOPIC ASSISTED VAGINAL HYSTERECTOMY;  Surgeon: Purcell NailsAngela Y Roberts, MD;  Location: [facility];  Service: Gynecology;  Laterality: N/A;  . Cystoscopy Bilateral 09/29/2012   Procedure: CYSTOSCOPY;  Surgeon: Purcell NailsAngela Y Roberts, MD;  Location: [facility];  Service: Gynecology;  Laterality: Bilateral;  . Bilateral salpingectomy Bilateral 09/29/2012    Procedure: BILATERAL SALPINGECTOMY;  Surgeon: Purcell NailsAngela Y Roberts, MD;  Location: [facility];  Service: Gynecology;  Laterality: Bilateral;  . Examination under anesthesia N/A 10/16/2012    Procedure: EXAM UNDER ANESTHESIA;  Surgeon: Purcell NailsAngela Y Roberts, MD;  Location: [facility];  Service: Gynecology;  Laterality: N/A;  Suturing Vaginal Cuff  . Abdominal hysterectomy N/A 09/29/2012    Procedure: HYSTERECTOMY ABDOMINAL;  Surgeon: Purcell NailsAngela Y Roberts, MD;  Location: [facility];  Service: Gynecology;  Laterality: N/A;   Family History  Problem Relation Age of Onset  . Diabetes Mother   . Diabetes Maternal Grandmother    History  Substance Use Topics  . Smoking status: Current Every Day Smoker -- 0.50 packs/day    Types: Cigarettes  . Smokeless tobacco: Never Used  . Alcohol Use: 0.6 oz/week    1 Glasses of wine per week     Comment: ocassionally   OB History   Grav Para Term Preterm Abortions TAB SAB Ect Mult Living   4 4 4       1      Review of Systems  All other systems negative except as documented in the HPI. All pertinent positives and negatives as reviewed in the HPI.  Allergies  Reglan; Aspirin; and Bee venom  Home Medications   Prior  to Admission medications   Medication Sig Start Date End Date Taking? Authorizing Provider  amoxicillin (AMOXIL) 500 MG capsule Take 1 capsule (500 mg total) by mouth 3 (three) times daily. 07/12/13  Yes Kaitlyn Szekalski, PA-C  ibuprofen (ADVIL,MOTRIN) 800 MG tablet Take 800 mg by mouth every 6 (six) hours as needed for pain.   Yes Historical Provider, MD  QUEtiapine (SEROQUEL) 100 MG tablet Take 50-300 mg by mouth at bedtime.   Yes Historical Provider, MD   BP 119/70  Pulse 91  Temp(Src) 98 F (36.7 C) (Oral)  Resp 16  SpO2 100%  LMP 07/25/2012 Physical Exam  Nursing note and vitals  reviewed. Constitutional: She is oriented to person, place, and time. She appears well-developed and well-nourished.  HENT:  Head: Normocephalic and atraumatic.  Eyes: EOM are normal. Pupils are equal, round, and reactive to light.  Neck: Normal range of motion. Neck supple.  Cardiovascular: Normal rate, regular rhythm and normal heart sounds.  Exam reveals no gallop and no friction rub.   No murmur heard. Pulmonary/Chest: Effort normal and breath sounds normal.  Neurological: She is alert and oriented to person, place, and time. She exhibits normal muscle tone. Coordination normal.  Skin: Skin is warm and dry. No rash noted. No erythema.    ED Course  Procedures (including critical care time)  Patient is feeling relief of her headache.  Following Toradol, Benadryl, Decadron and IV fluids.  Told to return here as, needed.  We'll give followup with neurology.  Carlyle Dollyhristopher W Clela Hagadorn, PA-C 07/15/13 1805

## 2013-07-15 NOTE — ED Notes (Signed)
Pt c/o migraine x 15 years; worse x 10 days; throbbing headache; states comes and goes; arguing with sister on phone--states this makes her headaches worse; states gets worse when she gets upsetting phone calls; states ibuprofen not working --last dose at 1300; light sensitivity; no n/v

## 2013-07-15 NOTE — ED Notes (Signed)
Pt alert and oriented x4. Respirations even and unlabored, bilateral symmetrical rise and fall of chest. Skin warm and dry. In no acute distress. Denies needs.   

## 2013-07-16 NOTE — ED Provider Notes (Signed)
Medical screening examination/treatment/procedure(s) were performed by non-physician practitioner and as supervising physician I was immediately available for consultation/collaboration.   EKG Interpretation None        Dagmar HaitWilliam Trixy Loyola, MD 07/16/13 [phone]

## 2013-08-27 ENCOUNTER — Emergency Department (HOSPITAL_COMMUNITY)
Admission: EM | Admit: 2013-08-27 | Discharge: 2013-08-27 | Discharge status: Against Medical Advice | Payer: Medicaid Other

## 2013-08-27 NOTE — ED Notes (Signed)
No answer when name called in lobby to triage pt

## 2013-08-27 NOTE — ED Notes (Signed)
No answer when pt's name called in waiting room or immediate area

## 2013-08-27 NOTE — ED Notes (Signed)
No answer when pt's name called in waiting room or immediate area outside; unable to locate pt.

## 2013-09-03 ENCOUNTER — Other Ambulatory Visit: Payer: Self-pay | Admitting: Family Medicine

## 2013-11-11 ENCOUNTER — Emergency Department (HOSPITAL_COMMUNITY)
Admission: EM | Admit: 2013-11-11 | Discharge: 2013-11-11 | Disposition: A | Discharge status: Home / Self Care | Payer: Medicaid Other | Attending: Emergency Medicine | Admitting: Emergency Medicine

## 2013-11-11 ENCOUNTER — Encounter (HOSPITAL_COMMUNITY): Payer: Self-pay | Admitting: Emergency Medicine

## 2013-11-11 ENCOUNTER — Emergency Department (HOSPITAL_COMMUNITY): Payer: Medicaid Other

## 2013-11-11 DIAGNOSIS — G8929 Other chronic pain: Secondary | ICD-10-CM | POA: Diagnosis not present

## 2013-11-11 DIAGNOSIS — R079 Chest pain, unspecified: Secondary | ICD-10-CM | POA: Insufficient documentation

## 2013-11-11 DIAGNOSIS — Z8739 Personal history of other diseases of the musculoskeletal system and connective tissue: Secondary | ICD-10-CM | POA: Insufficient documentation

## 2013-11-11 DIAGNOSIS — F329 Major depressive disorder, single episode, unspecified: Secondary | ICD-10-CM | POA: Diagnosis not present

## 2013-11-11 DIAGNOSIS — J45901 Unspecified asthma with (acute) exacerbation: Secondary | ICD-10-CM | POA: Insufficient documentation

## 2013-11-11 DIAGNOSIS — Z3202 Encounter for pregnancy test, result negative: Secondary | ICD-10-CM | POA: Diagnosis not present

## 2013-11-11 DIAGNOSIS — M546 Pain in thoracic spine: Secondary | ICD-10-CM | POA: Insufficient documentation

## 2013-11-11 DIAGNOSIS — F3289 Other specified depressive episodes: Secondary | ICD-10-CM | POA: Insufficient documentation

## 2013-11-11 DIAGNOSIS — F17200 Nicotine dependence, unspecified, uncomplicated: Secondary | ICD-10-CM | POA: Diagnosis not present

## 2013-11-11 DIAGNOSIS — Z79899 Other long term (current) drug therapy: Secondary | ICD-10-CM | POA: Insufficient documentation

## 2013-11-11 DIAGNOSIS — J4521 Mild intermittent asthma with (acute) exacerbation: Secondary | ICD-10-CM

## 2013-11-11 LAB — BASIC METABOLIC PANEL
ANION GAP: 11 (ref 5–15)
BUN: 5 mg/dL — AB (ref 6–23)
CALCIUM: 9.5 mg/dL (ref 8.4–10.5)
CHLORIDE: 104 meq/L (ref 96–112)
CO2: 25 meq/L (ref 19–32)
CREATININE: 0.77 mg/dL (ref 0.50–1.10)
GFR calc Af Amer: >90 mL/min (ref >90)
GFR calc non Af Amer: >90 mL/min (ref >90)
GLUCOSE: 99 mg/dL (ref 70–99)
Potassium: 4.2 mEq/L (ref 3.7–5.3)
Sodium: 140 mEq/L (ref 137–147)

## 2013-11-11 LAB — CBC
HEMATOCRIT: 33.6 % — AB (ref 36.0–46.0)
HEMOGLOBIN: 11.1 g/dL — AB (ref 12.0–15.0)
MCH: 28.2 pg (ref 26.0–34.0)
MCHC: 33 g/dL (ref 30.0–36.0)
MCV: 85.3 fL (ref 78.0–100.0)
Platelets: 290 10*3/uL (ref 150–400)
RBC: 3.94 MIL/uL (ref 3.87–5.11)
RDW: 12.7 % (ref 11.5–15.5)
WBC: 10.7 10*3/uL — AB (ref 4.0–10.5)

## 2013-11-11 LAB — I-STAT TROPONIN, ED: Troponin i, poc: 0 ng/mL (ref 0.00–0.08)

## 2013-11-11 LAB — POC URINE PREG, ED: PREG TEST UR: NEGATIVE

## 2013-11-11 MED ORDER — ALBUTEROL SULFATE HFA 108 (90 BASE) MCG/ACT IN AERS: 2.0000 | INHALATION_SPRAY | Freq: Four times a day (QID) | RESPIRATORY_TRACT | Status: DC | PRN

## 2013-11-11 MED ORDER — HYDROCODONE-ACETAMINOPHEN 5-325 MG PO TABS: ORAL_TABLET | ORAL | Status: DC

## 2013-11-11 MED ORDER — OXYCODONE-ACETAMINOPHEN 5-325 MG PO TABS
1.0000 | ORAL_TABLET | Freq: Once | ORAL | Status: AC
  Administered 2013-11-11: 1 via ORAL
  Filled 2013-11-11: qty 1

## 2013-11-11 MED ORDER — PREDNISONE 20 MG PO TABS: 40.0000 mg | ORAL_TABLET | Freq: Every day | ORAL | Status: DC

## 2013-11-11 MED ORDER — METHOCARBAMOL 500 MG PO TABS: 1000.0000 mg | ORAL_TABLET | Freq: Four times a day (QID) | ORAL | Status: DC

## 2013-11-11 MED ORDER — PREDNISONE 20 MG PO TABS
40.0000 mg | ORAL_TABLET | Freq: Once | ORAL | Status: AC
  Administered 2013-11-11: 40 mg via ORAL
  Filled 2013-11-11: qty 2

## 2013-11-11 NOTE — Discharge Instructions (Signed)
Please read and follow all provided instructions.  Your diagnoses today include:  1. Asthma, mild intermittent, with acute exacerbation   2. Right-sided thoracic back pain     Tests performed today include:  Blood counts and electrolytes  Blood test for heart muscle damage  Chest x-ray - no pneumonia  Vital signs. See below for your results today.   Medications prescribed:   Albuterol inhaler - medication that opens up your airway  Use inhaler as follows: 1-2 puffs with spacer every 4 hours as needed for wheezing, cough, or shortness of breath.    Vicodin (hydrocodone/acetaminophen) - narcotic pain medication  DO NOT drive or perform any activities that require you to be awake and alert because this medicine can make you drowsy. BE VERY CAREFUL not to take multiple medicines containing Tylenol (also called acetaminophen). Doing so can lead to an overdose which can damage your liver and cause liver failure and possibly death.   Prednisone - steroid medicine   It is best to take this medication in the morning to prevent sleeping problems. If you are diabetic, monitor your blood sugar closely and stop taking Prednisone if blood sugar is over 300. Take with food to prevent stomach upset.    Robaxin (methocarbamol) - muscle relaxer medication  DO NOT drive or perform any activities that require you to be awake and alert because this medicine can make you drowsy.   Take any prescribed medications only as directed.  Home care instructions:  Follow any educational materials contained in this packet.  Follow-up instructions: Please follow-up with your primary care provider in the next 3 days for further evaluation of your symptoms and management of your asthma.  Return instructions:   Please return to the Emergency Department if you experience worsening symptoms.  Please return with worsening wheezing, shortness of breath, or difficulty breathing.  Return with persistent  fever above 101F.   Please return if you have any other emergent concerns.  Additional Information:  Your vital signs today were: BP 119/63   Pulse 78   Temp(Src) 98.2 F (36.8 C) (Oral)   Resp 16   SpO2 100%   LMP 07/25/2012 If your blood pressure (BP) was elevated above 135/85 this visit, please have this repeated by your doctor within one month. --------------

## 2013-11-11 NOTE — ED Provider Notes (Signed)
CSN: [id number]     Arrival date & time 11/11/13  1729 History   First MD Initiated Contact with Patient 11/11/13 2029     Chief Complaint  Patient presents with  . Chest Pain  . Back Pain  . Asthma     (Consider location/radiation/quality/duration/timing/severity/associated sxs/prior Treatment) HPI Comments: Patient with history of asthma and back pain presents with complaint of worsening wheezing and shortness of breath over the past week. Patient states these symptoms are her typical asthma symptoms. 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 She thinks that this is making her breathing worse. Patient uses her home albuterol inhaler for improvement in symptoms. She has had cough but no significant production of sputum. Patient denies risk factors for pulmonary embolism including: unilateral leg swelling, history of DVT/PE/other blood clots, use of estrogens, recent immobilizations, recent surgery, recent travel (>4hr segment), malignancy, hemoptysis. No documented fever.  Patient also complains of pain in her right neck with radiation to her right upper chest. Pain is worse with palpation. She denies injury to the area. 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 Patient states that she has pain from this from time to time however the pain is typically lower.     Patient is a 28 y.o. female presenting with chest pain, back pain, and asthma. The history is provided by the patient and medical records.  Chest Pain Associated symptoms: back pain   Associated symptoms: no abdominal pain, no cough, no diaphoresis, no fever, no headache, no nausea, no palpitations, no shortness of breath and not vomiting   Back Pain Associated symptoms: chest pain   Associated symptoms: no abdominal pain, no dysuria, no fever and no headaches   Asthma Associated symptoms include chest pain. Pertinent negatives include no abdominal  pain, coughing, diaphoresis, fever, headaches, myalgias, nausea, neck pain, rash, sore throat or vomiting.    Past Medical History  Diagnosis Date  . Asthma     albuterol inhaler  . Blood transfusion     2008 with c/s 4 units transfused  . Headache(784.0)     otc ibuprofen 800mg   . 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 Depression   . Chronic back pain    Past Surgical History  Procedure Laterality Date  . Appendectomy    . Cesarean section    . Cholecystectomy    . Laparoscopy    . Hand surgery    . Tonsillectomy      T & A  . Tubal ligation    . Endometrial ablation    . Laparoscopic hysterectomy Bilateral 09/29/2012    Procedure: HYSTERECTOMY TOTAL LAPAROSCOPIC;  Surgeon: Purcell NailsAngela Y Roberts, MD;  Location: [facility];  Service: Gynecology;  Laterality: Bilateral;  Total Laparoscopic Hysterectomy, Bilateral Salpingectomies, Cystoscopy poss. LAVH poss. TAH  . Laparoscopic assisted vaginal hysterectomy N/A 09/29/2012    Procedure: LAPAROSCOPIC ASSISTED VAGINAL HYSTERECTOMY;  Surgeon: Purcell NailsAngela Y Roberts, MD;  Location: [facility];  Service: Gynecology;  Laterality: N/A;  . Cystoscopy Bilateral 09/29/2012    Procedure: CYSTOSCOPY;  Surgeon: Purcell NailsAngela Y Roberts, MD;  Location: [facility];  Service: Gynecology;  Laterality: Bilateral;  . Bilateral salpingectomy Bilateral 09/29/2012    Procedure: BILATERAL SALPINGECTOMY;  Surgeon: Purcell NailsAngela Y Roberts, MD;  Location: [facility];  Service: Gynecology;  Laterality: Bilateral;  . Examination under anesthesia N/A 10/16/2012  Procedure: EXAM UNDER ANESTHESIA;  Surgeon: Purcell Nails, MD;  Location: [facility];  Service: Gynecology;  Laterality: N/A;  Suturing Vaginal Cuff  . Abdominal hysterectomy N/A 09/29/2012    Procedure: HYSTERECTOMY ABDOMINAL;  Surgeon: Purcell Nails, MD;  Location: [facility];  Service: Gynecology;  Laterality: N/A;   Family History  Problem Relation Age of Onset  . Diabetes Mother   . Diabetes Maternal  Grandmother    History  Substance Use Topics  . Smoking status: Current Every Day Smoker -- 0.50 packs/day    Types: Cigarettes  . Smokeless tobacco: Never Used  . Alcohol Use: 0.6 oz/week    1 Glasses of wine per week     Comment: ocassionally   OB History   Grav Para Term Preterm Abortions TAB SAB Ect Mult Living   4 4 4       1      Review of Systems  Constitutional: Negative for fever and diaphoresis.  HENT: Negative for rhinorrhea and sore throat.   Eyes: Negative for redness.  Respiratory: Negative for cough and shortness of breath.   Cardiovascular: Positive for chest pain. Negative for palpitations and leg swelling.  Gastrointestinal: Negative for nausea, vomiting, abdominal pain and diarrhea.  Genitourinary: Negative for dysuria.  Musculoskeletal: Positive for back pain. Negative for myalgias and neck pain.  Skin: Negative for rash.  Neurological: Negative for syncope, light-headedness and headaches.    Allergies  Reglan; Aspirin; and Bee venom  Home Medications   Prior to Admission medications   Medication Sig Start Date End Date Taking? Authorizing Provider  albuterol (PROVENTIL HFA;VENTOLIN HFA) 108 (90 BASE) MCG/ACT inhaler Inhale 2 puffs into the lungs every 6 (six) hours as needed for wheezing or shortness of breath.   Yes Historical Provider, MD  FLUoxetine (PROZAC) 40 MG capsule Take 40 mg by mouth daily. 10/13/13  Yes Historical Provider, MD  ibuprofen (ADVIL,MOTRIN) 200 MG tablet Take 800 mg by mouth every 6 (six) hours as needed for headache or moderate pain.   Yes Historical Provider, MD  QUEtiapine (SEROQUEL) 300 MG tablet Take 300 mg by mouth daily.   Yes Historical Provider, MD  SUMAtriptan (IMITREX) 25 MG tablet Take 25 mg by mouth every 2 (two) hours as needed for migraine or headache. May repeat in 2 hours if headache persists or recurs.   Yes Historical Provider, MD   BP 119/63  Pulse 78  Temp(Src) 98.2 F (36.8 C) (Oral)  Resp 16  SpO2 100%   LMP 07/25/2012  Physical Exam  Nursing note and vitals reviewed. Constitutional: She appears well-developed and well-nourished.  HENT:  Head: Normocephalic and atraumatic.  Eyes: Conjunctivae are normal.  Neck: Normal range of motion. Neck supple.  Cardiovascular: Normal rate, regular rhythm and normal heart sounds.   No murmur heard. Pulmonary/Chest: Effort normal and breath sounds normal. No respiratory distress. She has no wheezes. She has no rales. She exhibits tenderness.  Abdominal: Soft. There is no tenderness. There is no rebound, no guarding and no CVA tenderness.  Musculoskeletal: Normal range of motion.       Right shoulder: She exhibits tenderness. She exhibits normal range of motion and no bony tenderness.       Back:       Arms: No step-off noted with palpation of spine.   Neurological: She is alert. She has normal strength and normal reflexes. No sensory deficit.  5/5 strength in entire lower extremities bilaterally. No sensation deficit.   Skin: Skin is warm  and dry. No rash noted.  Psychiatric: She has a normal mood and affect.    ED Course  Procedures (including critical care time) Labs Review Labs Reviewed  CBC - Abnormal; Notable for the following:    WBC 10.7 (*)    Hemoglobin 11.1 (*)    HCT 33.6 (*)    All other components within normal limits  BASIC METABOLIC PANEL - Abnormal; Notable for the following:    BUN 5 (*)    All other components within normal limits  I-STAT TROPOININ, ED  POC URINE PREG, ED    Imaging Review Dg Chest 2 View  11/11/2013   CLINICAL DATA:  Chest pain and shortness of breath.  EXAM: CHEST  2 VIEW  COMPARISON:  11/22/2012  FINDINGS: The heart size and pulmonary vascularity are normal and the lungs are clear. No effusions. No significant osseous abnormality.  IMPRESSION: No acute disease in the chest.   Electronically Signed   By: Geanie Cooley M.D.   On: 11/11/2013 19:42     EKG Interpretation None      8:51 PM Patient  seen and examined.   Vital signs reviewed and are as follows: BP 119/63  Pulse 78  Temp(Src) 98.2 F (36.8 C) (Oral)  Resp 16  SpO2 100%  LMP 07/25/2012  Patient informed of results. Offered breathing treatment but patient declines. Her exam is currently normal.  Encourage patient to followup with her primary care physician if asthma symptoms remain uncontrolled.  Patient urged to return with worsening symptoms or other concerns. Patient verbalized understanding and agrees with plan.   Patient counseled on use of narcotic pain medications. Counseled not to combine these medications with others containing tylenol. Urged not to drink alcohol, drive, or perform any other activities that requires focus while taking these medications. The patient verbalizes understanding and agrees with the plan.  Patient counseled on proper use of muscle relaxant medication.  They were told not to drink alcohol, drive any vehicle, or do any dangerous activities while taking this medication.  Patient verbalized understanding.    MDM   Final diagnoses:  Asthma, mild intermittent, with acute exacerbation  Right-sided thoracic back pain   Asthma: Patient given dose of prednisone in ED. Will continue for 4 additional days for short term relief of symptoms. Also patient provided with prescription for albuterol inhaler.  Chest/neck pain: Obviously reproducible. No pleuritic component. Do not suspect PE and patient is PERC negative. EKG nonischemic. Troponin is negative. Do not suspect ACS. Patient given a limited supply of pain medication and muscle relaxer.    Renne Crigler, PA-C 11/11/13 2133

## 2013-11-11 NOTE — ED Notes (Signed)
Pt is here with sharp chest pain that radiates to right neck and right rib areas.  Pt complains of asthma history.  Pt reports sob for 3 days.  Pt is talking in full sentences and no wheezes heard.

## 2013-11-16 NOTE — ED Provider Notes (Signed)
Medical screening examination/treatment/procedure(s) were performed by non-physician practitioner and as supervising physician I was immediately available for consultation/collaboration.    Korrie Hofbauer L Bayan Kushnir, MD 11/16/13 0735 

## 2013-12-03 ENCOUNTER — Emergency Department (HOSPITAL_COMMUNITY)
Admission: EM | Admit: 2013-12-03 | Discharge: 2013-12-03 | Disposition: A | Discharge status: Home / Self Care | Payer: Medicaid Other | Attending: Emergency Medicine | Admitting: Emergency Medicine

## 2013-12-03 ENCOUNTER — Encounter (HOSPITAL_COMMUNITY): Payer: Self-pay | Admitting: Emergency Medicine

## 2013-12-03 ENCOUNTER — Emergency Department (HOSPITAL_COMMUNITY): Payer: Medicaid Other

## 2013-12-03 DIAGNOSIS — IMO0002 Reserved for concepts with insufficient information to code with codable children: Secondary | ICD-10-CM | POA: Insufficient documentation

## 2013-12-03 DIAGNOSIS — G8929 Other chronic pain: Secondary | ICD-10-CM | POA: Insufficient documentation

## 2013-12-03 DIAGNOSIS — F17200 Nicotine dependence, unspecified, uncomplicated: Secondary | ICD-10-CM | POA: Diagnosis not present

## 2013-12-03 DIAGNOSIS — F3289 Other specified depressive episodes: Secondary | ICD-10-CM | POA: Insufficient documentation

## 2013-12-03 DIAGNOSIS — Z79899 Other long term (current) drug therapy: Secondary | ICD-10-CM | POA: Diagnosis not present

## 2013-12-03 DIAGNOSIS — J45901 Unspecified asthma with (acute) exacerbation: Secondary | ICD-10-CM | POA: Insufficient documentation

## 2013-12-03 DIAGNOSIS — M129 Arthropathy, unspecified: Secondary | ICD-10-CM | POA: Diagnosis not present

## 2013-12-03 DIAGNOSIS — F329 Major depressive disorder, single episode, unspecified: Secondary | ICD-10-CM | POA: Diagnosis not present

## 2013-12-03 MED ORDER — PREDNISONE 20 MG PO TABS
60.0000 mg | ORAL_TABLET | Freq: Once | ORAL | Status: AC
  Administered 2013-12-03: 60 mg via ORAL
  Filled 2013-12-03: qty 3

## 2013-12-03 MED ORDER — PREDNISONE 50 MG PO TABS: 50.0000 mg | ORAL_TABLET | Freq: Every day | ORAL | Status: AC

## 2013-12-03 MED ORDER — ALBUTEROL SULFATE (2.5 MG/3ML) 0.083% IN NEBU
5.0000 mg | INHALATION_SOLUTION | Freq: Once | RESPIRATORY_TRACT | Status: AC
  Administered 2013-12-03: 5 mg via RESPIRATORY_TRACT
  Filled 2013-12-03: qty 6

## 2013-12-03 MED ORDER — IPRATROPIUM BROMIDE 0.02 % IN SOLN
0.5000 mg | Freq: Once | RESPIRATORY_TRACT | Status: AC
Start: 2013-12-03 — End: 2013-12-03
  Administered 2013-12-03: 0.5 mg via RESPIRATORY_TRACT
  Filled 2013-12-03: qty 2.5

## 2013-12-03 MED ORDER — KETOROLAC TROMETHAMINE 60 MG/2ML IM SOLN
60.0000 mg | Freq: Once | INTRAMUSCULAR | Status: AC
  Administered 2013-12-03: 60 mg via INTRAMUSCULAR
  Filled 2013-12-03: qty 2

## 2013-12-03 NOTE — ED Provider Notes (Signed)
CSN: [id number]     Arrival date & time 12/03/13  1032 History   First MD Initiated Contact with Patient 12/03/13 1226     Chief Complaint  Patient presents with  . Asthma     Patient is a 28 y.o. female presenting with asthma. The history is provided by the patient.  Asthma This is a recurrent problem. The current episode started today. Progression since onset: improving after duoneb in triage. Associated symptoms include coughing and a sore throat (1 week). Pertinent negatives include no chest pain, fatigue, fever or nausea. Nothing aggravates the symptoms. She has tried nothing for the symptoms.   Hx of asthma and sx are similar to prior exacerbations.  Has inhaler at home but brought wrong one (daughters) to work.  Sx started at work where it is very hot and no air circulation.  Notes 1 week of sore throat, no dysphagia, change in phonation, cough.  No hemoptysis, pleurisy, lower edema, calf pain, OCP use, no smoking, immobilization.    Past Medical History  Diagnosis Date  . Asthma     albuterol inhaler  . Blood transfusion     2008 with c/s 4 units transfused  . Headache(784.0)     otc ibuprofen   . 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 Depression   . Chronic back pain    Past Surgical History  Procedure Laterality Date  . Appendectomy    . Cesarean section    . Cholecystectomy    . Laparoscopy    . Hand surgery    . Tonsillectomy      T & A  . Tubal ligation    . Endometrial ablation    . Laparoscopic hysterectomy Bilateral 09/29/2012    Procedure: HYSTERECTOMY TOTAL LAPAROSCOPIC;  Surgeon: Purcell Nails, MD;  Location: [facility];  Service: Gynecology;  Laterality: Bilateral;  Total Laparoscopic Hysterectomy, Bilateral Salpingectomies, Cystoscopy poss. LAVH poss. TAH  . Laparoscopic assisted vaginal hysterectomy N/A 09/29/2012    Procedure: LAPAROSCOPIC ASSISTED VAGINAL HYSTERECTOMY;  Surgeon: Purcell Nails, MD;  Location: [facility];   Service: Gynecology;  Laterality: N/A;  . Cystoscopy Bilateral 09/29/2012    Procedure: CYSTOSCOPY;  Surgeon: Purcell Nails, MD;  Location: [facility];  Service: Gynecology;  Laterality: Bilateral;  . Bilateral salpingectomy Bilateral 09/29/2012    Procedure: BILATERAL SALPINGECTOMY;  Surgeon: Purcell Nails, MD;  Location: [facility];  Service: Gynecology;  Laterality: Bilateral;  . Examination under anesthesia N/A 10/16/2012    Procedure: EXAM UNDER ANESTHESIA;  Surgeon: Purcell Nails, MD;  Location: [facility];  Service: Gynecology;  Laterality: N/A;  Suturing Vaginal Cuff  . Abdominal hysterectomy N/A 09/29/2012    Procedure: HYSTERECTOMY ABDOMINAL;  Surgeon: Purcell Nails, MD;  Location: [facility];  Service: Gynecology;  Laterality: N/A;   Family History  Problem Relation Age of Onset  . Diabetes Mother   . Diabetes Maternal Grandmother    History  Substance Use Topics  . Smoking status: Current Every Day Smoker -- 0.50 packs/day    Types: Cigarettes  . Smokeless tobacco: Never Used  . Alcohol Use: 0.6 oz/week    1 Glasses of wine per week     Comment: ocassionally   OB History   Grav Para Term Preterm Abortions TAB SAB Ect Mult Living   Review of Systems  Constitutional: Negative  for fever and fatigue.  HENT: Positive for sore throat (1 week).   Respiratory: Positive for cough.   Cardiovascular: Negative for chest pain.  Gastrointestinal: Negative for nausea.      Allergies  Reglan; Aspirin; and Bee venom  Home Medications   Prior to Admission medications   Medication Sig Start Date End Date Taking? Authorizing Provider  albuterol (PROVENTIL HFA;VENTOLIN HFA) 108 (90 BASE) MCG/ACT inhaler Inhale 2 puffs into the lungs every 6 (six) hours as needed for wheezing or shortness of breath. 11/11/13   Renne Crigler, PA-C  FLUoxetine (PROZAC) 40 MG capsule Take 40 mg by mouth daily. 10/13/13   Historical Provider, MD  HYDROcodone-acetaminophen (NORCO/VICODIN)  5-325 MG per tablet Take 1-2 tablets every 6 hours as needed for severe pain 11/11/13   Renne Crigler, PA-C  ibuprofen (ADVIL,MOTRIN) 200 MG tablet Take 800 mg by mouth every 6 (six) hours as needed for headache or moderate pain.    Historical Provider, MD  methocarbamol (ROBAXIN) 500 MG tablet Take 2 tablets (1,000 mg total) by mouth 4 (four) times daily. 11/11/13   Renne Crigler, PA-C  predniSONE (DELTASONE) 20 MG tablet Take 2 tablets (40 mg total) by mouth daily. 11/11/13   Renne Crigler, PA-C  QUEtiapine (SEROQUEL) 300 MG tablet Take 300 mg by mouth daily.    Historical Provider, MD  SUMAtriptan (IMITREX) 25 MG tablet Take 25 mg by mouth every 2 (two) hours as needed for migraine or headache. May repeat in 2 hours if headache persists or recurs.    Historical Provider, MD   BP 121/72  Temp(Src) 98.5 F (36.9 C) (Oral)  Resp 24  Ht  (1.6 m)  Wt 207 lb (93.895 kg)  BMI 36.68 kg/m2  SpO2 100%  LMP 07/25/2012 Physical Exam  Nursing note and vitals reviewed. Constitutional: She is oriented to person, place, and time. She appears well-developed and well-nourished. No distress.  HENT:  Head: Normocephalic and atraumatic.  Nose: Nose normal.  No orpharyngeal edema, erythema or exudate  Eyes: Conjunctivae are normal.  Neck: Normal range of motion. Neck supple. No tracheal deviation present.  Cardiovascular: Normal rate, regular rhythm and normal heart sounds.   No murmur heard. Pulmonary/Chest: Effort normal and breath sounds normal. No respiratory distress. She has no rales.  No wheezes, normal exp phase, good air entry  Abdominal: Soft. Bowel sounds are normal. She exhibits no distension and no mass. There is no tenderness.  Musculoskeletal: Normal range of motion. She exhibits no edema.  No calf TTP, erythema, edema, or palpable cords. TTP at upper shoulders, reproduces neck discomfort (chronic)  Lymphadenopathy:    She has no cervical adenopathy.  Neurological: She is alert and  oriented to person, place, and time.  Skin: Skin is warm and dry. No rash noted.  Psychiatric: She has a normal mood and affect.    ED Course  Procedures (including critical care time) Labs Review Labs Reviewed - No data to display  Imaging Review Dg Chest 2 View (if Patient Has Fever And/or Copd)  12/03/2013   CLINICAL DATA:  Asthma  EXAM: CHEST  2 VIEW  COMPARISON:  PA and lateral chest of November 11, 2013  FINDINGS: The lungs are adequately inflated. There is no focal infiltrate. The heart and pulmonary vascularity are normal. There is no pleural effusion or pneumothorax. The bony thorax exhibits no acute abnormality.  IMPRESSION: There is no significant hyperinflation nor evidence of acute cardiopulmonary abnormality.   Electronically Signed   By: Onalee Hua  [place]   On: 12/03/2013 12:20     MDM   Final diagnoses:  Asthma exacerbation    Filed Vitals:   12/03/13 1300 12/03/13 1315 12/03/13 1330 12/03/13 1454  BP: 113/72 103/54 105/52 120/61  Pulse: 90 88 87 91  Temp:      TempSrc:      Resp: Height:      Weight:      SpO2: 100% 100% 100% 100%    Hx of asthma. Sx similar with wheezing per pt.  VSS, exam with lungs CTAB but after receiving duoneb in triage.  Cannot PERC rule out due to tachy after neb, but suspicon very low for PE as there are no signs of DVT on exam, no hypoxia or pleurisy, and primary reversible diagnosis much more likely. CXR without PNA. She notes chronic upper shoulder pain that is reproducible and MSK.  Pain improved with toradol.  At time of discharge, VSS, feeling much better.  D/c with short prednisone course. F/u PCP  Sofie Rower, MD 12/03/13 [phone]

## 2013-12-03 NOTE — ED Provider Notes (Signed)
I saw and evaluated the patient, reviewed the resident's note and I agree with the findings and plan.   EKG Interpretation None      Patient presents with shortness of breath. Also reports chest pain that has been ongoing for the last 2 weeks. It has been constant and is worse with movement. 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 Patient reports onset of shortness of breath was earlier today. She states she felt like she was unable to catch her breath do to "no circulating air at work." She was given a duo neb prior to evaluation and states that that helped. She feels that it may have flared her asthma. She denies any history of blood clots, recent surgeries, recent travel. Chest pain is reproducible on exam. At this time lung sounds are urine patient states that she's improved. She was given Toradol for reproducible chest pain. Have low suspicion at this time for PE. Patient is mildly tachycardic but this is in the setting of recently receiving a DuoNeb. We'll place on prednisone and discharged home.  After history, exam, and medical workup I feel the patient has been appropriately medically screened and is safe for discharge home. Pertinent diagnoses were discussed with the patient. Patient was given return precautions.   Shon Baton, MD 12/03/13 [phone]

## 2013-12-03 NOTE — Discharge Instructions (Signed)

## 2013-12-03 NOTE — ED Notes (Signed)
Pt c/o SOB with cough that feels like increased asthma sx; pt sts did not have inhaler

## 2013-12-24 ENCOUNTER — Encounter (HOSPITAL_COMMUNITY): Payer: Self-pay | Admitting: Emergency Medicine

## 2013-12-24 ENCOUNTER — Emergency Department (HOSPITAL_COMMUNITY)
Admission: EM | Admit: 2013-12-24 | Discharge: 2013-12-24 | Disposition: A | Discharge status: Home / Self Care | Payer: Medicaid Other | Attending: Emergency Medicine | Admitting: Emergency Medicine

## 2013-12-24 DIAGNOSIS — J45909 Unspecified asthma, uncomplicated: Secondary | ICD-10-CM | POA: Diagnosis not present

## 2013-12-24 DIAGNOSIS — Z8659 Personal history of other mental and behavioral disorders: Secondary | ICD-10-CM | POA: Diagnosis not present

## 2013-12-24 DIAGNOSIS — G43909 Migraine, unspecified, not intractable, without status migrainosus: Secondary | ICD-10-CM | POA: Insufficient documentation

## 2013-12-24 DIAGNOSIS — Z8739 Personal history of other diseases of the musculoskeletal system and connective tissue: Secondary | ICD-10-CM | POA: Insufficient documentation

## 2013-12-24 DIAGNOSIS — F17200 Nicotine dependence, unspecified, uncomplicated: Secondary | ICD-10-CM | POA: Diagnosis not present

## 2013-12-24 DIAGNOSIS — Z79899 Other long term (current) drug therapy: Secondary | ICD-10-CM | POA: Insufficient documentation

## 2013-12-24 DIAGNOSIS — G8929 Other chronic pain: Secondary | ICD-10-CM | POA: Diagnosis not present

## 2013-12-24 MED ORDER — SODIUM CHLORIDE 0.9 % IV BOLUS (SEPSIS)
1000.0000 mL | Freq: Once | INTRAVENOUS | Status: AC
  Administered 2013-12-24: 1000 mL via INTRAVENOUS

## 2013-12-24 MED ORDER — PROCHLORPERAZINE EDISYLATE 5 MG/ML IJ SOLN
10.0000 mg | Freq: Once | INTRAMUSCULAR | Status: AC
Start: 2013-12-24 — End: 2013-12-24
  Administered 2013-12-24: 10 mg via INTRAVENOUS
  Filled 2013-12-24: qty 2

## 2013-12-24 MED ORDER — DIPHENHYDRAMINE HCL 50 MG/ML IJ SOLN
25.0000 mg | Freq: Once | INTRAMUSCULAR | Status: AC
  Administered 2013-12-24: 25 mg via INTRAVENOUS
  Filled 2013-12-24: qty 1

## 2013-12-24 MED ORDER — KETOROLAC TROMETHAMINE 30 MG/ML IJ SOLN
30.0000 mg | Freq: Once | INTRAMUSCULAR | Status: AC
  Administered 2013-12-24: 30 mg via INTRAVENOUS
  Filled 2013-12-24: qty 1

## 2013-12-24 MED ORDER — IBUPROFEN 800 MG PO TABS: 800.0000 mg | ORAL_TABLET | Freq: Three times a day (TID) | ORAL | Status: DC | PRN

## 2013-12-24 NOTE — ED Provider Notes (Signed)
CSN: [id number]     Arrival date & time 12/24/13  0426 History   First MD Initiated Contact with Patient 12/24/13 [phone]     Chief Complaint  Patient presents with  . Headache     (Consider location/radiation/quality/duration/timing/severity/associated sxs/prior Treatment) HPI Patient presents to the emergency department with migraine headache that started yesterday around 1:30 PM.  Patient, states, that she took 3 ibuprofen without relief of her headache.  Patient, states, that her headache is bilateral.  Patient, states, that she does have history migraine headaches, and ibuprofen usually works.  Patient, states, that she's not had any nausea, vomiting, blurred vision, weakness, dizziness, lightheadedness, fever, chest pain, shortness of breath, abdominal pain, neck pain, back pain, dysuria, or syncope.  Patient, states, that nothing seems to make her headache, better, but sounds and light makes her headache, worse Past Medical History  Diagnosis Date  . Asthma     albuterol inhaler  . Blood transfusion     2008 with c/s 4 units transfused  . Headache(784.0)     otc ibuprofen   . 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 Depression   . Chronic back pain    Past Surgical History  Procedure Laterality Date  . Appendectomy    . Cesarean section    . Cholecystectomy    . Laparoscopy    . Hand surgery    . Tonsillectomy      T & A  . Tubal ligation    . Endometrial ablation    . Laparoscopic hysterectomy Bilateral 09/29/2012    Procedure: HYSTERECTOMY TOTAL LAPAROSCOPIC;  Surgeon: Purcell Nails, MD;  Location: [facility];  Service: Gynecology;  Laterality: Bilateral;  Total Laparoscopic Hysterectomy, Bilateral Salpingectomies, Cystoscopy poss. LAVH poss. TAH  . Laparoscopic assisted vaginal hysterectomy N/A 09/29/2012    Procedure: LAPAROSCOPIC ASSISTED VAGINAL HYSTERECTOMY;  Surgeon: Purcell Nails, MD;  Location: [facility];  Service: Gynecology;   Laterality: N/A;  . Cystoscopy Bilateral 09/29/2012    Procedure: CYSTOSCOPY;  Surgeon: Purcell Nails, MD;  Location: [facility];  Service: Gynecology;  Laterality: Bilateral;  . Bilateral salpingectomy Bilateral 09/29/2012    Procedure: BILATERAL SALPINGECTOMY;  Surgeon: Purcell Nails, MD;  Location: [facility];  Service: Gynecology;  Laterality: Bilateral;  . Examination under anesthesia N/A 10/16/2012    Procedure: EXAM UNDER ANESTHESIA;  Surgeon: Purcell Nails, MD;  Location: [facility];  Service: Gynecology;  Laterality: N/A;  Suturing Vaginal Cuff  . Abdominal hysterectomy N/A 09/29/2012    Procedure: HYSTERECTOMY ABDOMINAL;  Surgeon: Purcell Nails, MD;  Location: [facility];  Service: Gynecology;  Laterality: N/A;   Family History  Problem Relation Age of Onset  . Diabetes Mother   . Diabetes Maternal Grandmother    History  Substance Use Topics  . Smoking status: Current Every Day Smoker -- 0.50 packs/day    Types: Cigarettes  . Smokeless tobacco: Never Used  . Alcohol Use: 0.6 oz/week    1 Glasses of wine per week     Comment: ocassionally   OB History   Grav Para Term Preterm Abortions TAB SAB Ect Mult Living   Review of Systems  All other systems negative except as documented in the HPI. All pertinent positives and negatives as reviewed in the HPI.  Allergies  Reglan; Aspirin; and Bee venom  Home Medications  Prior to Admission medications   Medication Sig Start Date End Date Taking? Authorizing Provider  albuterol (PROVENTIL HFA;VENTOLIN HFA) 108 (90 BASE) MCG/ACT inhaler Inhale 2 puffs into the lungs every 6 (six) hours as needed for wheezing or shortness of breath. 11/11/13  Yes Joshua Geiple, PA-C   BP 131/64  Pulse 117  Temp(Src) 100.1 F (37.8 C) (Oral)  Resp 18  SpO2 98%  LMP 07/25/2012 Physical Exam  Nursing note and vitals reviewed. Constitutional: She is oriented to person, place, and time. She appears well-developed and  well-nourished. No distress.  HENT:  Head: Normocephalic and atraumatic.  Mouth/Throat: Oropharynx is clear and moist.  Eyes: Pupils are equal, round, and reactive to light.  Neck: Normal range of motion. Neck supple.  Cardiovascular: Normal rate, regular rhythm and normal heart sounds.   Pulmonary/Chest: Effort normal and breath sounds normal.  Neurological: She is alert and oriented to person, place, and time. She exhibits normal muscle tone. Coordination normal.  Skin: Skin is warm and dry.    ED Course  Procedures (including critical care time) Patient has received IV fluids, Toradol, Benadryl and Compazine with relief of her headache.  Patient is advised primary care Dr. told to return here as needed.    Carlyle Dolly, PA-C 12/24/13 [phone]

## 2013-12-24 NOTE — ED Provider Notes (Signed)
Medical screening examination/treatment/procedure(s) were performed by non-physician practitioner and as supervising physician I was immediately available for consultation/collaboration.   EKG Interpretation None        Quinlee Sciarra, MD 12/24/13 1746 

## 2013-12-24 NOTE — ED Notes (Signed)
Pt states she got a headache yesterday around 1330  Pt states she took 3 ibuprofen but it will not go away  Pt states usually that helps  Denies N/V  Pt states she has hx of migraines

## 2013-12-24 NOTE — Discharge Instructions (Signed)
Return here as needed.  Followup with your primary care Dr. for recheck.  Increase your fluid intake °

## 2014-02-01 ENCOUNTER — Encounter (HOSPITAL_COMMUNITY): Payer: Self-pay | Admitting: Emergency Medicine

## 2014-02-02 ENCOUNTER — Emergency Department (HOSPITAL_COMMUNITY)
Admission: EM | Admit: 2014-02-02 | Discharge: 2014-02-02 | Disposition: A | Discharge status: Home / Self Care | Payer: Medicaid Other | Attending: Emergency Medicine | Admitting: Emergency Medicine

## 2014-02-02 ENCOUNTER — Encounter (HOSPITAL_COMMUNITY): Payer: Self-pay | Admitting: *Deleted

## 2014-02-02 DIAGNOSIS — N898 Other specified noninflammatory disorders of vagina: Secondary | ICD-10-CM | POA: Diagnosis present

## 2014-02-02 DIAGNOSIS — J45909 Unspecified asthma, uncomplicated: Secondary | ICD-10-CM | POA: Diagnosis not present

## 2014-02-02 DIAGNOSIS — M1990 Unspecified osteoarthritis, unspecified site: Secondary | ICD-10-CM | POA: Insufficient documentation

## 2014-02-02 DIAGNOSIS — Z8659 Personal history of other mental and behavioral disorders: Secondary | ICD-10-CM | POA: Diagnosis not present

## 2014-02-02 DIAGNOSIS — N390 Urinary tract infection, site not specified: Secondary | ICD-10-CM | POA: Insufficient documentation

## 2014-02-02 DIAGNOSIS — Z720 Tobacco use: Secondary | ICD-10-CM | POA: Diagnosis not present

## 2014-02-02 DIAGNOSIS — G8929 Other chronic pain: Secondary | ICD-10-CM | POA: Insufficient documentation

## 2014-02-02 DIAGNOSIS — Z79899 Other long term (current) drug therapy: Secondary | ICD-10-CM | POA: Diagnosis not present

## 2014-02-02 LAB — URINALYSIS, ROUTINE W REFLEX MICROSCOPIC
BILIRUBIN URINE: NEGATIVE
Glucose, UA: NEGATIVE mg/dL
Ketones, ur: NEGATIVE mg/dL
NITRITE: NEGATIVE
PROTEIN: NEGATIVE mg/dL
Specific Gravity, Urine: 1.014 (ref 1.005–1.030)
Urobilinogen, UA: 1 mg/dL (ref 0.0–1.0)
pH: 7 (ref 5.0–8.0)

## 2014-02-02 LAB — WET PREP, GENITAL
CLUE CELLS WET PREP: NONE SEEN
Trich, Wet Prep: NONE SEEN
WBC, Wet Prep HPF POC: NONE SEEN
Yeast Wet Prep HPF POC: NONE SEEN

## 2014-02-02 LAB — URINE MICROSCOPIC-ADD ON

## 2014-02-02 MED ORDER — CEFTRIAXONE SODIUM 250 MG IJ SOLR
250.0000 mg | Freq: Once | INTRAMUSCULAR | Status: AC
  Administered 2014-02-02: 250 mg via INTRAMUSCULAR
  Filled 2014-02-02: qty 250

## 2014-02-02 MED ORDER — CEPHALEXIN 500 MG PO CAPS: 500.0000 mg | ORAL_CAPSULE | Freq: Two times a day (BID) | ORAL | Status: DC

## 2014-02-02 MED ORDER — AZITHROMYCIN 250 MG PO TABS
1000.0000 mg | ORAL_TABLET | Freq: Once | ORAL | Status: AC
  Administered 2014-02-02: 1000 mg via ORAL
  Filled 2014-02-02: qty 4

## 2014-02-02 NOTE — Discharge Instructions (Signed)

## 2014-02-02 NOTE — ED Notes (Signed)
Pt c/o vaginal discharge/odor; foul smelling urine; denies pain or burning with urination

## 2014-02-02 NOTE — ED Provider Notes (Signed)
CSN: [id number]     Arrival date & time 02/02/14  0910 History   First MD Initiated Contact with Patient 02/02/14 0914     Chief Complaint  Patient presents with  . Urinary Tract Infection     (Consider location/radiation/quality/duration/timing/severity/associated sxs/prior Treatment) HPI  This is a 28 year old female who presents with foul-smelling urine. Denies dysuria or hematuria. States "I think I have a urinary tract infection." States that 2 days ago she also noted white vaginal discharge which is since subsided. She denies any fevers. Patient denies any nausea, vomiting, diarrhea, abdominal pain. She is sexually active with 1 partner and does not use protection. She is status post hysterectomy.  Past Medical History  Diagnosis Date  . Asthma     albuterol inhaler  . Blood transfusion     2008 with c/s 4 units transfused  . Headache(784.0)     otc ibuprofen 800mg   . 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 Depression   . Chronic back pain    Past Surgical History  Procedure Laterality Date  . Appendectomy    . Cesarean section    . Cholecystectomy    . Laparoscopy    . Hand surgery    . Tonsillectomy      T & A  . Tubal ligation    . Endometrial ablation    . Laparoscopic hysterectomy Bilateral 09/29/2012    Procedure: HYSTERECTOMY TOTAL LAPAROSCOPIC;  Surgeon: Purcell NailsAngela Y Roberts, MD;  Location: [facility];  Service: Gynecology;  Laterality: Bilateral;  Total Laparoscopic Hysterectomy, Bilateral Salpingectomies, Cystoscopy poss. LAVH poss. TAH  . Laparoscopic assisted vaginal hysterectomy N/A 09/29/2012    Procedure: LAPAROSCOPIC ASSISTED VAGINAL HYSTERECTOMY;  Surgeon: Purcell NailsAngela Y Roberts, MD;  Location: [facility];  Service: Gynecology;  Laterality: N/A;  . Cystoscopy Bilateral 09/29/2012    Procedure: CYSTOSCOPY;  Surgeon: Purcell NailsAngela Y Roberts, MD;  Location: [facility];  Service: Gynecology;  Laterality: Bilateral;  . Bilateral salpingectomy Bilateral  09/29/2012    Procedure: BILATERAL SALPINGECTOMY;  Surgeon: Purcell NailsAngela Y Roberts, MD;  Location: [facility];  Service: Gynecology;  Laterality: Bilateral;  . Examination under anesthesia N/A 10/16/2012    Procedure: EXAM UNDER ANESTHESIA;  Surgeon: Purcell NailsAngela Y Roberts, MD;  Location: [facility];  Service: Gynecology;  Laterality: N/A;  Suturing Vaginal Cuff  . Abdominal hysterectomy N/A 09/29/2012    Procedure: HYSTERECTOMY ABDOMINAL;  Surgeon: Purcell NailsAngela Y Roberts, MD;  Location: [facility];  Service: Gynecology;  Laterality: N/A;   Family History  Problem Relation Age of Onset  . Diabetes Mother   . Diabetes Maternal Grandmother    History  Substance Use Topics  . Smoking status: Current Every Day Smoker -- 0.50 packs/day    Types: Cigarettes  . Smokeless tobacco: Never Used  . Alcohol Use: 0.6 oz/week    1 Glasses of wine per week     Comment: ocassionally   OB History    Gravida Para Term Preterm AB TAB SAB Ectopic Multiple Living   4 4 4       1      Review of Systems  Constitutional: Negative for fever.  Respiratory: Negative for chest tightness and shortness of breath.   Cardiovascular: Negative for chest pain.  Gastrointestinal: Negative for nausea, vomiting and abdominal pain.  Genitourinary: Positive for vaginal discharge. Negative for dysuria, difficulty urinating, vaginal pain and pelvic pain.       Foul-smelling urine  Musculoskeletal: Negative for back  pain.  All other systems reviewed and are negative.     Allergies  Reglan; Aspirin; and Bee venom  Home Medications   Prior to Admission medications   Medication Sig Start Date End Date Taking? Authorizing Provider  albuterol (PROVENTIL HFA;VENTOLIN HFA) 108 (90 BASE) MCG/ACT inhaler Inhale 2 puffs into the lungs every 6 (six) hours as needed for wheezing or shortness of breath. 11/11/13   Renne CriglerJoshua Geiple, PA-C  cephALEXin (KEFLEX) 500 MG capsule Take 1 capsule (500 mg total) by mouth 2 (two) times daily. 02/02/14   Shon Batonourtney F Nayef College, MD   ibuprofen (ADVIL,MOTRIN) 800 MG tablet Take 1 tablet (800 mg total) by mouth every 8 (eight) hours as needed. 12/24/13   Jamesetta Orleanshristopher W Lawyer, PA-C   BP 126/68 mmHg  Pulse 93  Temp(Src) 97.9 F (36.6 C) (Oral)  Resp 18  SpO2 100%  LMP 07/25/2012 Physical Exam  Constitutional: She is oriented to person, place, and time. She appears well-developed and well-nourished. No distress.  overweight  HENT:  Head: Normocephalic and atraumatic.  Cardiovascular: Normal rate, regular rhythm and normal heart sounds.   No murmur heard. Pulmonary/Chest: Effort normal and breath sounds normal. No respiratory distress. She has no wheezes.  Abdominal: Soft. Bowel sounds are normal. There is no tenderness. There is no rebound.  Genitourinary: Vagina normal.  Moderate white vaginal discharge  Neurological: She is alert and oriented to person, place, and time.  Skin: Skin is warm and dry.  Psychiatric: She has a normal mood and affect.  Nursing note and vitals reviewed.   ED Course  Procedures (including critical care time) Labs Review Labs Reviewed  URINALYSIS, ROUTINE W REFLEX MICROSCOPIC - Abnormal; Notable for the following:    APPearance CLOUDY (*)    Hgb urine dipstick MODERATE (*)    Leukocytes, UA LARGE (*)    All other components within normal limits  URINE MICROSCOPIC-ADD ON - Abnormal; Notable for the following:    Squamous Epithelial / LPF FEW (*)    Bacteria, UA FEW (*)    All other components within normal limits  WET PREP, GENITAL  GC/CHLAMYDIA PROBE AMP  URINE CULTURE    Imaging Review No results found.   EKG Interpretation None      MDM   Final diagnoses:  UTI (lower urinary tract infection)    Patient presents with symptoms of urinary tract infection. Also reports vaginal discharge. Does not use protection. Urinalysis with evidence of large leuks and few bacteria. We'll send for culture and treat given patient's symptoms. Pelvic exam moderate amount of white  vaginal discharge. Patient has elected to be empirically treated with Rocephin and azithromycin for STDs. GC Chlamydia swab pending. Wet prep is unremarkable. Will discharge home with Keflex for UTI.  After history, exam, and medical workup I feel the patient has been appropriately medically screened and is safe for discharge home. Pertinent diagnoses were discussed with the patient. Patient was given return precautions.     Shon Batonourtney F Alfonse Garringer, MD 02/02/14 1020

## 2014-02-03 LAB — GC/CHLAMYDIA PROBE AMP
CT Probe RNA: NEGATIVE
GC Probe RNA: NEGATIVE

## 2014-02-04 LAB — URINE CULTURE: Colony Count: >=100000

## 2014-02-05 ENCOUNTER — Telehealth (HOSPITAL_BASED_OUTPATIENT_CLINIC_OR_DEPARTMENT_OTHER): Payer: Self-pay | Admitting: Emergency Medicine

## 2014-02-05 NOTE — Telephone Encounter (Signed)
Post ED Visit - Positive Culture Follow-up  Culture report reviewed by antimicrobial stewardship pharmacist: []  Wes Dulaney, Pharm.D., BCPS []  Celedonio MiyamotoJeremy Frens, Pharm.D., BCPS []  Georgina PillionElizabeth Martin, [address]Ph[address]D., BCPS [x]  ColliervilleMinh Pham, [address]Pharm[address]D., BCPS, AAHIVP []  Estella HuskMichelle Turner, Pharm.D., BCPS, AAHIVP []  Babs BertinHaley Baird, [address]Pharm[address]D.   Positive urine culture  E. Coli Treated with cephalexin, organism sensitive to the same and no further patient follow-up is required at this time.  Berle MullMiller, Azam Gervasi 02/05/2014, 12:41 PM

## 2014-03-08 ENCOUNTER — Encounter (HOSPITAL_COMMUNITY): Payer: Self-pay | Admitting: Emergency Medicine

## 2014-03-08 ENCOUNTER — Emergency Department (HOSPITAL_COMMUNITY)
Admission: EM | Admit: 2014-03-08 | Discharge: 2014-03-08 | Disposition: A | Discharge status: Home / Self Care | Payer: Medicaid Other | Attending: Emergency Medicine | Admitting: Emergency Medicine

## 2014-03-08 DIAGNOSIS — M1990 Unspecified osteoarthritis, unspecified site: Secondary | ICD-10-CM | POA: Diagnosis not present

## 2014-03-08 DIAGNOSIS — J45909 Unspecified asthma, uncomplicated: Secondary | ICD-10-CM | POA: Insufficient documentation

## 2014-03-08 DIAGNOSIS — N390 Urinary tract infection, site not specified: Secondary | ICD-10-CM | POA: Insufficient documentation

## 2014-03-08 DIAGNOSIS — G8929 Other chronic pain: Secondary | ICD-10-CM | POA: Diagnosis not present

## 2014-03-08 DIAGNOSIS — F329 Major depressive disorder, single episode, unspecified: Secondary | ICD-10-CM | POA: Insufficient documentation

## 2014-03-08 DIAGNOSIS — Z79899 Other long term (current) drug therapy: Secondary | ICD-10-CM | POA: Diagnosis not present

## 2014-03-08 DIAGNOSIS — Z720 Tobacco use: Secondary | ICD-10-CM | POA: Insufficient documentation

## 2014-03-08 DIAGNOSIS — R300 Dysuria: Secondary | ICD-10-CM | POA: Diagnosis present

## 2014-03-08 LAB — URINALYSIS, ROUTINE W REFLEX MICROSCOPIC
Bilirubin Urine: NEGATIVE
Glucose, UA: NEGATIVE mg/dL
Ketones, ur: NEGATIVE mg/dL
Nitrite: NEGATIVE
PH: 5.5 (ref 5.0–8.0)
Protein, ur: NEGATIVE mg/dL
Specific Gravity, Urine: 1.008 (ref 1.005–1.030)
Urobilinogen, UA: 0.2 mg/dL (ref 0.0–1.0)

## 2014-03-08 LAB — URINE MICROSCOPIC-ADD ON

## 2014-03-08 MED ORDER — CEPHALEXIN 500 MG PO CAPS: 500.0000 mg | ORAL_CAPSULE | Freq: Four times a day (QID) | ORAL | Status: DC

## 2014-03-08 NOTE — ED Notes (Signed)
Per pt, states vaginal discomfort only when she urinates, no odor, discharge

## 2014-03-08 NOTE — ED Notes (Signed)
MD at bedside. EDP REES PRESENT TO EVALUATE THIS PT 

## 2014-03-08 NOTE — Discharge Instructions (Signed)
Take the prescribed medication as directed.  May take the pyridium you have at home if needed. Follow-up with your primary care physician. Return to the ED for new or worsening symptoms.

## 2014-03-08 NOTE — ED Provider Notes (Signed)
CSN: [id number]     Arrival date & time 03/08/14  [id number] History   First MD Initiated Contact with Patient 03/08/14 [phone]     Chief Complaint  Patient presents with  . Dysuria     (Consider location/radiation/quality/duration/timing/severity/associated sxs/prior Treatment) The history is provided by the patient and medical records.    This is a 28 year old female with past medical history significant for asthma, headaches, arthritis, depression, chronic back pain, presenting to the ED for dysuria. Patient states it's been ongoing for the past 2 days. She denies any urinary frequency, hematuria, fever, sweats, or chills. No vaginal complaints or pelvic pain.  No flank pain.  No new sexual partners or concern for STD. Patient is status post hysterectomy.  Patient does admit to semi-frequent UTI's, most recent was last month.  VS stable on arrival.  Past Medical History  Diagnosis Date  . Asthma     albuterol inhaler  . Blood transfusion     2008 with c/s 4 units transfused  . Headache(784.0)     otc ibuprofen 800mg   . 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 Depression   . Chronic back pain    Past Surgical History  Procedure Laterality Date  . Appendectomy    . Cesarean section    . Cholecystectomy    . Laparoscopy    . Hand surgery    . Tonsillectomy      T & A  . Tubal ligation    . Endometrial ablation    . Laparoscopic hysterectomy Bilateral 09/29/2012    Procedure: HYSTERECTOMY TOTAL LAPAROSCOPIC;  Surgeon: Purcell NailsAngela Y Roberts, MD;  Location: [facility];  Service: Gynecology;  Laterality: Bilateral;  Total Laparoscopic Hysterectomy, Bilateral Salpingectomies, Cystoscopy poss. LAVH poss. TAH  . Laparoscopic assisted vaginal hysterectomy N/A 09/29/2012    Procedure: LAPAROSCOPIC ASSISTED VAGINAL HYSTERECTOMY;  Surgeon: Purcell NailsAngela Y Roberts, MD;  Location: [facility];  Service: Gynecology;  Laterality: N/A;  . Cystoscopy Bilateral 09/29/2012    Procedure:  CYSTOSCOPY;  Surgeon: Purcell NailsAngela Y Roberts, MD;  Location: [facility];  Service: Gynecology;  Laterality: Bilateral;  . Bilateral salpingectomy Bilateral 09/29/2012    Procedure: BILATERAL SALPINGECTOMY;  Surgeon: Purcell NailsAngela Y Roberts, MD;  Location: [facility];  Service: Gynecology;  Laterality: Bilateral;  . Examination under anesthesia N/A 10/16/2012    Procedure: EXAM UNDER ANESTHESIA;  Surgeon: Purcell NailsAngela Y Roberts, MD;  Location: [facility];  Service: Gynecology;  Laterality: N/A;  Suturing Vaginal Cuff  . Abdominal hysterectomy N/A 09/29/2012    Procedure: HYSTERECTOMY ABDOMINAL;  Surgeon: Purcell NailsAngela Y Roberts, MD;  Location: [facility];  Service: Gynecology;  Laterality: N/A;   Family History  Problem Relation Age of Onset  . Diabetes Mother   . Diabetes Maternal Grandmother    History  Substance Use Topics  . Smoking status: Current Every Day Smoker -- 0.50 packs/day    Types: Cigarettes  . Smokeless tobacco: Never Used  . Alcohol Use: 0.6 oz/week    1 Glasses of wine per week     Comment: ocassionally   OB History    Gravida Para Term Preterm AB TAB SAB Ectopic Multiple Living   4 4 4       1      Review of Systems  Genitourinary: Positive for dysuria.  All other systems reviewed and are negative.     Allergies  Bee venom; Reglan; and Aspirin  Home Medications   Prior to Admission medications  Medication Sig Start Date End Date Taking? Authorizing Provider  albuterol (PROVENTIL HFA;VENTOLIN HFA) 108 (90 BASE) MCG/ACT inhaler Inhale 2 puffs into the lungs every 6 (six) hours as needed for wheezing or shortness of breath. 11/11/13  Yes Renne CriglerJoshua Geiple, PA-C  QUEtiapine (SEROQUEL XR) 300 MG 24 hr tablet Take 300 mg by mouth at bedtime.   Yes Historical Provider, MD  cephALEXin (KEFLEX) 500 MG capsule Take 1 capsule (500 mg total) by mouth 2 (two) times daily. Patient not taking: Reported on 03/08/2014 02/02/14   Shon Batonourtney F Horton, MD  ibuprofen (ADVIL,MOTRIN) 800 MG tablet Take 1 tablet (800 mg total) by  mouth every 8 (eight) hours as needed. Patient not taking: Reported on 03/08/2014 12/24/13   Jamesetta Orleanshristopher W Lawyer, PA-C   BP 129/69 mmHg  Pulse 81  Temp(Src) 97.6 F (36.4 C) (Oral)  Resp 16  Ht 5' (1.524 m)  Wt 216 lb (97.977 kg)  BMI 42.18 kg/m2  SpO2 100%  LMP 07/25/2012   Physical Exam  Constitutional: She is oriented to person, place, and time. She appears well-developed and well-nourished. No distress.  HENT:  Head: Normocephalic and atraumatic.  Mouth/Throat: Oropharynx is clear and moist.  Eyes: Conjunctivae and EOM are normal. Pupils are equal, round, and reactive to light.  Neck: Normal range of motion.  Cardiovascular: Normal rate, regular rhythm and normal heart sounds.   Pulmonary/Chest: Effort normal and breath sounds normal. No respiratory distress. She has no wheezes.  Abdominal: Soft. Bowel sounds are normal. There is no tenderness. There is no guarding and no CVA tenderness.  No CVA tenderness  Musculoskeletal: Normal range of motion.  Neurological: She is alert and oriented to person, place, and time.  Skin: Skin is warm and dry. She is not diaphoretic.  Psychiatric: She has a normal mood and affect.  Nursing note and vitals reviewed.   ED Course  Procedures (including critical care time) Labs Review Labs Reviewed  URINALYSIS, ROUTINE W REFLEX MICROSCOPIC - Abnormal; Notable for the following:    APPearance CLOUDY (*)    Hgb urine dipstick MODERATE (*)    Leukocytes, UA LARGE (*)    All other components within normal limits  URINE MICROSCOPIC-ADD ON - Abnormal; Notable for the following:    Bacteria, UA MANY (*)    All other components within normal limits  URINE CULTURE    Imaging Review No results found.   EKG Interpretation None      MDM   Final diagnoses:  UTI (lower urinary tract infection)  Dysuria   28 year old female with complaint of dysuria the past 2 days. Patient afebrile and nontoxic in appearance. Her abdominal exam is  benign, no CVA tenderness. No reported flank pain, N/V/D.  She is status post hysterectomy. UA cloudy with many bacteria.  This is patient's second UTI in the past 2 months, will send urine for culture. She will be started on Keflex pending urine culture. She has Pyridium at home that she will take for discomfort.  Encouraged FU with PCP.  Discussed plan with patient, he/she acknowledged understanding and agreed with plan of care.  Return precautions given for new or worsening symptoms.  Garlon HatchetLisa M Sanders, PA-C 03/08/14 1104  Tilden FossaElizabeth Rees, MD 03/08/14 [phone]

## 2014-03-10 LAB — URINE CULTURE: Colony Count: >=100000

## 2014-03-11 ENCOUNTER — Telehealth (HOSPITAL_BASED_OUTPATIENT_CLINIC_OR_DEPARTMENT_OTHER): Payer: Self-pay | Admitting: Emergency Medicine

## 2014-03-11 NOTE — Telephone Encounter (Signed)
Post ED Visit - Positive Culture Follow-up  Culture report reviewed by antimicrobial stewardship pharmacist: []  Wes Dulaney, Pharm.D., BCPS [x]  Celedonio MiyamotoJeremy Frens, Pharm.D., BCPS []  Georgina PillionElizabeth Martin, Pharm.D., BCPS []  Palmy[place]Minh Pham, [place]Pharm[place]D., BCPS, AAHIVP []  Estella HuskMichelle Turner, Pharm.D., BCPS, AAHIVP []  Elder CyphersLorie Poole, [address]Pharm[address]D., BCPS  Positive urine culture E. Coli Treated with cephalexin, organism sensitive to the same and no further patient follow-up is required at this time.  Berle MullMiller, Fran Neiswonger 03/11/2014, 3:09 PM

## 2014-05-28 ENCOUNTER — Other Ambulatory Visit: Payer: Self-pay | Admitting: Physical Medicine and Rehabilitation

## 2014-05-28 DIAGNOSIS — M545 Low back pain: Secondary | ICD-10-CM

## 2014-06-13 ENCOUNTER — Ambulatory Visit
Admission: RE | Admit: 2014-06-13 | Discharge: 2014-06-13 | Disposition: A | Discharge status: Home / Self Care | Payer: Medicaid Other | Source: Ambulatory Visit | Attending: Physical Medicine and Rehabilitation | Admitting: Physical Medicine and Rehabilitation

## 2014-06-13 DIAGNOSIS — M545 Low back pain: Secondary | ICD-10-CM

## 2014-08-29 ENCOUNTER — Emergency Department (HOSPITAL_COMMUNITY)
Admission: EM | Admit: 2014-08-29 | Discharge: 2014-08-29 | Disposition: A | Discharge status: Home / Self Care | Payer: Medicaid Other | Attending: Emergency Medicine | Admitting: Emergency Medicine

## 2014-08-29 ENCOUNTER — Encounter (HOSPITAL_COMMUNITY): Payer: Self-pay

## 2014-08-29 DIAGNOSIS — Z3202 Encounter for pregnancy test, result negative: Secondary | ICD-10-CM | POA: Insufficient documentation

## 2014-08-29 DIAGNOSIS — Z87891 Personal history of nicotine dependence: Secondary | ICD-10-CM | POA: Insufficient documentation

## 2014-08-29 DIAGNOSIS — Z79899 Other long term (current) drug therapy: Secondary | ICD-10-CM | POA: Diagnosis not present

## 2014-08-29 DIAGNOSIS — F329 Major depressive disorder, single episode, unspecified: Secondary | ICD-10-CM | POA: Insufficient documentation

## 2014-08-29 DIAGNOSIS — G8929 Other chronic pain: Secondary | ICD-10-CM | POA: Diagnosis not present

## 2014-08-29 DIAGNOSIS — M47896 Other spondylosis, lumbar region: Secondary | ICD-10-CM | POA: Diagnosis not present

## 2014-08-29 DIAGNOSIS — N898 Other specified noninflammatory disorders of vagina: Secondary | ICD-10-CM | POA: Diagnosis present

## 2014-08-29 DIAGNOSIS — N760 Acute vaginitis: Secondary | ICD-10-CM | POA: Diagnosis not present

## 2014-08-29 DIAGNOSIS — B9689 Other specified bacterial agents as the cause of diseases classified elsewhere: Secondary | ICD-10-CM

## 2014-08-29 DIAGNOSIS — J45909 Unspecified asthma, uncomplicated: Secondary | ICD-10-CM | POA: Diagnosis not present

## 2014-08-29 DIAGNOSIS — Z792 Long term (current) use of antibiotics: Secondary | ICD-10-CM | POA: Insufficient documentation

## 2014-08-29 LAB — WET PREP, GENITAL
Trich, Wet Prep: NONE SEEN
WBC WET PREP: NONE SEEN
Yeast Wet Prep HPF POC: NONE SEEN

## 2014-08-29 LAB — URINALYSIS, ROUTINE W REFLEX MICROSCOPIC
Bilirubin Urine: NEGATIVE
Glucose, UA: NEGATIVE mg/dL
Ketones, ur: NEGATIVE mg/dL
Leukocytes, UA: NEGATIVE
Nitrite: NEGATIVE
PH: 6.5 (ref 5.0–8.0)
Protein, ur: NEGATIVE mg/dL
Specific Gravity, Urine: 1.014 (ref 1.005–1.030)
UROBILINOGEN UA: 0.2 mg/dL (ref 0.0–1.0)

## 2014-08-29 LAB — URINE MICROSCOPIC-ADD ON

## 2014-08-29 LAB — PREGNANCY, URINE: Preg Test, Ur: NEGATIVE

## 2014-08-29 MED ORDER — METRONIDAZOLE 500 MG PO TABS: 500.0000 mg | ORAL_TABLET | Freq: Three times a day (TID) | ORAL | Status: DC

## 2014-08-29 NOTE — Discharge Instructions (Signed)
Return to the ED with any concerns including abdominal pain, fever/chills, vomiting and not able to keep down liquids or medications, decreased level of alertness/lethargy, or any other alarming symptoms

## 2014-08-29 NOTE — ED Provider Notes (Signed)
CSN: [id number]     Arrival date & time 08/29/14  1001 History   First MD Initiated Contact with Patient 08/29/14 1012     Chief Complaint  Patient presents with  . Vaginal Discharge     (Consider location/radiation/quality/duration/timing/severity/associated sxs/prior Treatment) HPI  Pt presents with c/o white vaginal discharge.  Symptoms started 3 days ago.  She was treated for yeast infection earlier this month and symptoms resolved, but now discharge has recurred.  No abdominal pain, no vomiting, no fever/chills.  No dysuria. No change in bowels.  She does endorse having unprotected sex.  There are no other associated systemic symptoms, there are no other alleviating or modifying factors.   Past Medical History  Diagnosis Date  . Asthma     albuterol inhaler  . Blood transfusion     2008 with c/s 4 units transfused  . Headache(784.0)     otc ibuprofen   . 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 Depression   . Chronic back pain    Past Surgical History  Procedure Laterality Date  . Appendectomy    . Cesarean section    . Cholecystectomy    . Laparoscopy    . Hand surgery    . Tonsillectomy      T & A  . Tubal ligation    . Endometrial ablation    . Laparoscopic hysterectomy Bilateral 09/29/2012    Procedure: HYSTERECTOMY TOTAL LAPAROSCOPIC;  Surgeon: Purcell Nails, MD;  Location: [facility];  Service: Gynecology;  Laterality: Bilateral;  Total Laparoscopic Hysterectomy, Bilateral Salpingectomies, Cystoscopy poss. LAVH poss. TAH  . Laparoscopic assisted vaginal hysterectomy N/A 09/29/2012    Procedure: LAPAROSCOPIC ASSISTED VAGINAL HYSTERECTOMY;  Surgeon: Purcell Nails, MD;  Location: [facility];  Service: Gynecology;  Laterality: N/A;  . Cystoscopy Bilateral 09/29/2012    Procedure: CYSTOSCOPY;  Surgeon: Purcell Nails, MD;  Location: [facility];  Service: Gynecology;  Laterality: Bilateral;  . Bilateral salpingectomy Bilateral 09/29/2012     Procedure: BILATERAL SALPINGECTOMY;  Surgeon: Purcell Nails, MD;  Location: [facility];  Service: Gynecology;  Laterality: Bilateral;  . Examination under anesthesia N/A 10/16/2012    Procedure: EXAM UNDER ANESTHESIA;  Surgeon: Purcell Nails, MD;  Location: [facility];  Service: Gynecology;  Laterality: N/A;  Suturing Vaginal Cuff  . Abdominal hysterectomy N/A 09/29/2012    Procedure: HYSTERECTOMY ABDOMINAL;  Surgeon: Purcell Nails, MD;  Location: [facility];  Service: Gynecology;  Laterality: N/A;   Family History  Problem Relation Age of Onset  . Diabetes Mother   . Diabetes Maternal Grandmother    History  Substance Use Topics  . Smoking status: Former Smoker -- 0.00 packs/day    Types: Cigarettes  . Smokeless tobacco: Never Used  . Alcohol Use: 0.6 oz/week    1 Glasses of wine per week     Comment: ocassionally   OB History    Gravida Para Term Preterm AB TAB SAB Ectopic Multiple Living   Review of Systems  ROS reviewed and all otherwise negative except for mentioned in HPI    Allergies  Bee venom; Reglan; and Aspirin  Home Medications   Prior to Admission medications   Medication Sig Start Date End Date Taking? Authorizing Provider  albuterol (PROVENTIL HFA;VENTOLIN HFA) 108 (90 BASE) MCG/ACT inhaler Inhale 2 puffs into the lungs every 6 (  six) hours as needed for wheezing or shortness of breath. 11/11/13  Yes Renne CriglerJoshua Geiple, PA-C  QUEtiapine (SEROQUEL XR) 300 MG 24 hr tablet Take 600 mg by mouth at bedtime.    Yes Historical Provider, MD  traZODone (DESYREL) 50 MG tablet Take 50 mg by mouth at bedtime.  07/26/14  Yes Historical Provider, MD  cephALEXin (KEFLEX) 500 MG capsule Take 1 capsule (500 mg total) by mouth 4 (four) times daily. Patient not taking: Reported on 08/29/2014 03/08/14   Garlon HatchetLisa M Sanders, PA-C  ibuprofen (ADVIL,MOTRIN) 800 MG tablet Take 1 tablet (800 mg total) by mouth every 8 (eight) hours as needed. Patient not taking: Reported on 03/08/2014  12/24/13   Charlestine Nighthristopher Lawyer, PA-C  metroNIDAZOLE (FLAGYL) 500 MG tablet Take 1 tablet (500 mg total) by mouth 3 (three) times daily. 08/29/14   Jerelyn ScottMartha Linker, MD   BP 117/66 mmHg  Pulse 83  Temp(Src) 97.8 F (36.6 C) (Oral)  Resp 20  Ht 5' (1.524 m)  Wt 223 lb (101.152 kg)  BMI 43.55 kg/m2  SpO2 100%  LMP 07/25/2012  Vitals reviewed Physical Exam  Physical Examination: General appearance - alert, well appearing, and in no distress Mental status - alert, oriented to person, place, and time Eyes - no conjunctival injection, no scleral icterus Mouth - mucous membranes moist, pharynx normal without lesions Chest - clear to auscultation, no wheezes, rales or rhonchi, symmetric air entry Heart - normal rate, regular rhythm, normal S1, S2, no murmurs, rubs, clicks or gallops Abdomen - soft, nontender, nondistended, no masses or organomegaly Pelvic - normal external genitalia, vulva, vagina, , white vaginal discharge, no CMT , no adnexal or uterine tenderness on bimual exam Extremities - peripheral pulses normal, no pedal edema, no clubbing or cyanosis Skin - normal coloration and turgor, no rashes  ED Course  Procedures (including critical care time) Labs Review Labs Reviewed  WET PREP, GENITAL - Abnormal; Notable for the following:    Clue Cells Wet Prep HPF POC MANY (*)    All other components within normal limits  URINALYSIS, ROUTINE W REFLEX MICROSCOPIC ([facility]) - Abnormal; Notable for the following:    APPearance CLOUDY (*)    Hgb urine dipstick SMALL (*)    All other components within normal limits  URINE MICROSCOPIC-ADD ON - Abnormal; Notable for the following:    Squamous Epithelial / LPF MANY (*)    Bacteria, UA FEW (*)    All other components within normal limits  PREGNANCY, URINE  HIV ANTIBODY (ROUTINE TESTING)  GC/CHLAMYDIA PROBE AMP ([place]) NOT [facility]    Imaging Review No results found.   EKG Interpretation None      MDM   Final diagnoses:   Bacterial vaginosis    Pt presenting with white vaginal discharge.  Pelvic exam had no CMT, no abdominal tenderness.  Wet prep c/w bacterial vaginosis- patient given rx for flagyl.  gen probe and other STd testing are pending.  Discharged with strict return precautions.  Pt agreeable with plan.    Jerelyn ScottMartha Linker, MD 08/31/14 [phone]

## 2014-08-29 NOTE — ED Notes (Signed)
Patient states she is having a white vaginal discharge that started 3 days ago. Patient states she has had unprotected sex.

## 2014-08-30 LAB — HIV ANTIBODY (ROUTINE TESTING W REFLEX): HIV Screen 4th Generation wRfx: NONREACTIVE

## 2014-08-31 LAB — GC/CHLAMYDIA PROBE AMP (~~LOC~~) NOT AT ARMC
Chlamydia: NEGATIVE
Neisseria Gonorrhea: NEGATIVE

## 2014-09-09 ENCOUNTER — Encounter (HOSPITAL_COMMUNITY): Payer: Self-pay | Admitting: Emergency Medicine

## 2014-09-09 ENCOUNTER — Emergency Department (HOSPITAL_COMMUNITY)
Admission: EM | Admit: 2014-09-09 | Discharge: 2014-09-09 | Disposition: A | Discharge status: Home / Self Care | Payer: Medicaid Other | Attending: Emergency Medicine | Admitting: Emergency Medicine

## 2014-09-09 DIAGNOSIS — G43809 Other migraine, not intractable, without status migrainosus: Secondary | ICD-10-CM | POA: Insufficient documentation

## 2014-09-09 DIAGNOSIS — Z87891 Personal history of nicotine dependence: Secondary | ICD-10-CM | POA: Insufficient documentation

## 2014-09-09 DIAGNOSIS — J45909 Unspecified asthma, uncomplicated: Secondary | ICD-10-CM | POA: Insufficient documentation

## 2014-09-09 DIAGNOSIS — Z79899 Other long term (current) drug therapy: Secondary | ICD-10-CM | POA: Diagnosis not present

## 2014-09-09 DIAGNOSIS — F329 Major depressive disorder, single episode, unspecified: Secondary | ICD-10-CM | POA: Diagnosis not present

## 2014-09-09 DIAGNOSIS — F439 Reaction to severe stress, unspecified: Secondary | ICD-10-CM

## 2014-09-09 DIAGNOSIS — R110 Nausea: Secondary | ICD-10-CM

## 2014-09-09 DIAGNOSIS — G4700 Insomnia, unspecified: Secondary | ICD-10-CM | POA: Insufficient documentation

## 2014-09-09 DIAGNOSIS — Z792 Long term (current) use of antibiotics: Secondary | ICD-10-CM | POA: Diagnosis not present

## 2014-09-09 DIAGNOSIS — G8929 Other chronic pain: Secondary | ICD-10-CM | POA: Diagnosis not present

## 2014-09-09 DIAGNOSIS — Z8739 Personal history of other diseases of the musculoskeletal system and connective tissue: Secondary | ICD-10-CM | POA: Diagnosis not present

## 2014-09-09 LAB — COMPREHENSIVE METABOLIC PANEL
ALK PHOS: 122 U/L (ref 38–126)
ALT: 30 U/L (ref 14–54)
AST: 28 U/L (ref 15–41)
Albumin: 4.2 g/dL (ref 3.5–5.0)
Anion gap: 9 (ref 5–15)
BUN: 5 mg/dL — ABNORMAL LOW (ref 6–20)
CALCIUM: 9.3 mg/dL (ref 8.9–10.3)
CHLORIDE: 104 mmol/L (ref 101–111)
CO2: 26 mmol/L (ref 22–32)
Creatinine, Ser: 0.83 mg/dL (ref 0.44–1.00)
GFR calc non Af Amer: >60 mL/min (ref >60)
Glucose, Bld: 106 mg/dL — ABNORMAL HIGH (ref 65–99)
Potassium: 3.7 mmol/L (ref 3.5–5.1)
SODIUM: 139 mmol/L (ref 135–145)
Total Bilirubin: 0.6 mg/dL (ref 0.3–1.2)
Total Protein: 7.9 g/dL (ref 6.5–8.1)

## 2014-09-09 LAB — CBC WITH DIFFERENTIAL/PLATELET
BASOS PCT: 0 % (ref 0–1)
Basophils Absolute: 0 10*3/uL (ref 0.0–0.1)
EOS PCT: 2 % (ref 0–5)
Eosinophils Absolute: 0.2 10*3/uL (ref 0.0–0.7)
HCT: 35.6 % — ABNORMAL LOW (ref 36.0–46.0)
Hemoglobin: 11.6 g/dL — ABNORMAL LOW (ref 12.0–15.0)
LYMPHS ABS: 2.8 10*3/uL (ref 0.7–4.0)
Lymphocytes Relative: 22 % (ref 12–46)
MCH: 27.8 pg (ref 26.0–34.0)
MCHC: 32.6 g/dL (ref 30.0–36.0)
MCV: 85.2 fL (ref 78.0–100.0)
MONOS PCT: 7 % (ref 3–12)
Monocytes Absolute: 0.9 10*3/uL (ref 0.1–1.0)
NEUTROS ABS: 8.9 10*3/uL — AB (ref 1.7–7.7)
NEUTROS PCT: 69 % (ref 43–77)
Platelets: 391 10*3/uL (ref 150–400)
RBC: 4.18 MIL/uL (ref 3.87–5.11)
RDW: 13.1 % (ref 11.5–15.5)
WBC: 12.8 10*3/uL — ABNORMAL HIGH (ref 4.0–10.5)

## 2014-09-09 LAB — LIPASE, BLOOD: LIPASE: 22 U/L (ref 22–51)

## 2014-09-09 LAB — I-STAT BETA HCG BLOOD, ED (MC, WL, AP ONLY): I-stat hCG, quantitative: <5 m[IU]/mL (ref <5)

## 2014-09-09 MED ORDER — QUETIAPINE FUMARATE ER 300 MG PO TB24: 600.0000 mg | ORAL_TABLET | Freq: Every day | ORAL | Status: DC

## 2014-09-09 MED ORDER — LORAZEPAM 2 MG/ML IJ SOLN
0.5000 mg | Freq: Once | INTRAMUSCULAR | Status: AC
  Administered 2014-09-09: 0.5 mg via INTRAVENOUS
  Filled 2014-09-09: qty 1

## 2014-09-09 MED ORDER — ONDANSETRON HCL 4 MG/2ML IJ SOLN
4.0000 mg | Freq: Once | INTRAMUSCULAR | Status: AC
  Administered 2014-09-09: 4 mg via INTRAVENOUS
  Filled 2014-09-09: qty 2

## 2014-09-09 MED ORDER — SODIUM CHLORIDE 0.9 % IV BOLUS (SEPSIS)
1000.0000 mL | Freq: Once | INTRAVENOUS | Status: AC
  Administered 2014-09-09: 1000 mL via INTRAVENOUS

## 2014-09-09 MED ORDER — ONDANSETRON 4 MG PO TBDP: 4.0000 mg | ORAL_TABLET | Freq: Three times a day (TID) | ORAL | Status: DC | PRN

## 2014-09-09 NOTE — ED Notes (Signed)
In to re-evaluate patient's pain.  Patient is asleep, awakened patient to assess headache pain.  Patient states headache decreased to 7/10 and nausea relieved.

## 2014-09-09 NOTE — Discharge Instructions (Signed)
Insomnia Insomnia is frequent trouble falling and/or staying asleep. Insomnia can be a long term problem or a short term problem. Both are common. Insomnia can be a short term problem when the wakefulness is related to a certain stress or worry. Long term insomnia is often related to ongoing stress during waking hours and/or poor sleeping habits. Overtime, sleep deprivation itself can make the problem worse. Every little thing feels more severe because you are overtired and your ability to cope is decreased. CAUSES   Stress, anxiety, and depression.  Poor sleeping habits.  Distractions such as TV in the bedroom.  Naps close to bedtime.  Engaging in emotionally charged conversations before bed.  Technical reading before sleep.  Alcohol and other sedatives. They may make the problem worse. They can hurt normal sleep patterns and normal dream activity.  Stimulants such as caffeine for several hours prior to bedtime.  Pain syndromes and shortness of breath can cause insomnia.  Exercise late at night.  Changing time zones may cause sleeping problems (jet lag). It is sometimes helpful to have someone observe your sleeping patterns. They should look for periods of not breathing during the night (sleep apnea). They should also look to see how long those periods last. If you live alone or observers are uncertain, you can also be observed at a sleep clinic where your sleep patterns will be professionally monitored. Sleep apnea requires a checkup and treatment. Give your caregivers your medical history. Give your caregivers observations your family has made about your sleep.  SYMPTOMS   Not feeling rested in the morning.  Anxiety and restlessness at bedtime.  Difficulty falling and staying asleep. TREATMENT   Your caregiver may prescribe treatment for an underlying medical disorders. Your caregiver can give advice or help if you are using alcohol or other drugs for self-medication.  Treatment of underlying problems will usually eliminate insomnia problems.  Medications can be prescribed for short time use. They are generally not recommended for lengthy use.  Over-the-counter sleep medicines are not recommended for lengthy use. They can be habit forming.  You can promote easier sleeping by making lifestyle changes such as:  Using relaxation techniques that help with breathing and reduce muscle tension.  Exercising earlier in the day.  Changing your diet and the time of your last meal. No night time snacks.  Establish a regular time to go to bed.  Counseling can help with stressful problems and worry.  Soothing music and white noise may be helpful if there are background noises you cannot remove.  Stop tedious detailed work at least one hour before bedtime. HOME CARE INSTRUCTIONS   Keep a diary. Inform your caregiver about your progress. This includes any medication side effects. See your caregiver regularly. Take note of:  Times when you are asleep.  Times when you are awake during the night.  The quality of your sleep.  How you feel the next day. This information will help your caregiver care for you.  Get out of bed if you are still awake after 15 minutes. Read or do some quiet activity. Keep the lights down. Wait until you feel sleepy and go back to bed.  Keep regular sleeping and waking hours. Avoid naps.  Exercise regularly.  Avoid distractions at bedtime. Distractions include watching television or engaging in any intense or detailed activity like attempting to balance the household checkbook.  Develop a bedtime ritual. Keep a familiar routine of bathing, brushing your teeth, climbing into bed at the  same time each night, listening to soothing music. Routines increase the success of falling to sleep faster.  Use relaxation techniques. This can be using breathing and muscle tension release routines. It can also include visualizing peaceful scenes.  You can also help control troubling or intruding thoughts by keeping your mind occupied with boring or repetitive thoughts like the old concept of counting sheep. You can make it more creative like imagining planting one beautiful flower after another in your backyard garden.  During your day, work to eliminate stress. When this is not possible use some of the previous suggestions to help reduce the anxiety that accompanies stressful situations. MAKE SURE YOU:   Understand these instructions.  Will watch your condition.  Will get help right away if you are not doing well or get worse. Document Released: 03/16/2000 Document Revised: 06/11/2011 Document Reviewed: 04/16/2007 [facility] Patient Information 2015 [place], [place]. This information is not intended to replace advice given to you by your health care provider. Make sure you discuss any questions you have with your health care provider.   RESOURCE GUIDE  Chronic Pain Problems: Contact [facility]  [phone] Patients need to be referred by their primary care doctor.  Insufficient Money for Medicine: Contact United Way:  call "211" or [place] [phone].  No Primary Care Doctor: Call Health Connect  [phone] - can help you locate a primary care doctor that  accepts your insurance, provides certain services, etc. Physician Referral Service- [phone]  Agencies that provide inexpensive medical care: [facility]  [facility]  [phone] [facility]  [phone] [facility]  [phone] Planned Parenthood  [phone] [facility]  [phone]  [place] Providers: [facility]- [address]  [phone], Mon-Fri 9am-7pm, Sat 9am-1pm [place], [address] [place]  [place], Suite  [place]  [place]- [address]  [place], [address], [phone]  Only accepts [place] patients after they have their name  applied to their card  Self Pay (no insurance) in [facility]: Sickle Cell Patients: Dr Kevan Ny, [facility]  [place], [facility]- [place]  [facility]- [address], [facility]- see information above (Speak to [employer] if you do not have insurance)       -  [facility]- [place], [place],  [place]       -  [address], [phone]       -  Dr Vista Lawman-  [address], [address], [place], [facility]- [address], [phone]       -  [facility]- [address], [place], also [address], [phone]       -    [facility]- [address], [place], 1st & 3rd Saturday   every month, 10am-1pm  1) Find a Tax adviser  and Pay Out of Pocket Although you won't have to find out who is covered by your insurance plan, it is a good idea to ask around and get recommendations. You will then need to call the office and see if the doctor you have chosen will accept you as a new patient and what types of options they offer for patients who are self-pay. Some doctors offer discounts or will set up payment plans for their patients who do not have insurance, but you will need to ask so you aren't surprised when you get to your appointment.  2) Contact Your Local Health Department Not all health departments have doctors that can see patients for sick visits, but many do, so it is worth a call to see if yours does. If you don't know where your local health department is, you can check in your  phone book. The CDC also has a tool to help you locate your state's health department, and many state websites also have listings of all of their local health departments.  3) Find a [place] [facility] If your illness is not likely to be very severe or complicated, you may want to try a walk in clinic. These are popping up all over the country in pharmacies, drugstores, and shopping centers. They're usually staffed by nurse practitioners or physician assistants that have been trained to treat common illnesses and complaints. They're usually fairly quick and inexpensive. However, if you have serious medical issues or chronic medical problems, these are probably not your best option  STD [facility], [facility], [address] [place], phone [phone] or [phone].  Monday - Friday, call for an appointment. [place], STD Clinic, [address], [place], phone [phone] or [phone].  Monday - Friday, call for an appointment.  Abuse/Neglect: [place] [phone] [place] [phone] (After Hours)  Emergency Shelter:  [facility] [phone]  Maternity Homes: [facility] [place] [phone] [place] [phone]  MRSA Hotline #:   (309)808-1819  [place] [facility] [place] Dept. [address]         [place] Phone:  [phone]                                  Phone:  [phone]                   Phone:  [place], [place] in [place], [address]  [phone], [place] [phone] or [phone] (After Hours)   [place]  Substance Abuse Resources: Alcohol and Drug Services  [place] [phone] [facility] [place] [facility] Chinita Pester [phone] Residential & Outpatient Substance Abuse Program  [phone]  Psychological Services: [place]  [phone] [place]  [place], [place] [address] [place], [place]: [phone] or [phone], [email or web address]  Dental Assistance  If unable to pay or uninsured, contact:  [facility] or [facility]. to become qualified for the adult dental clinic.  Patients with Medicaid: [facility] [phone] [address] [place], [place] [address] [phone]  If unable to pay, or uninsured, contact [facility] [phone]) or [place] [phone] in [place], [place] in [facility]) to become qualified for the adult dental clinic  Other [place]- [place], [place], [place], [address], [place], [place], 2nd and 4th Thursday of the month at 6:30am.  10 clients each day by appointment, can sometimes see walk-in patients if someone does not show for an appointment. [facility]- [address] [place] Dank[place], [place], [address], [place], [place], [place], [address], [place] [facility] [phone]  Please make every effort to establish with a primary care physician for routine medical care  [place]  [facility] [place] provides a wide range of adult health services. Some of these services are designed to address the healthcare needs of all [facility] residents and all services are designed to meet the needs of uninsured/underinsured low income residents. Some services are available to any resident of [place], call [phone] for details. ] The [facility], a new medical clinic for adults, is now open. For more information about the Center and its services please call [phone]. For information on our [place] services, click here.  For more information on any of the following Department of Public Health programs, including hours of service, click on the highlighted link.  SERVICES FOR WOMEN (Adults and Teens) [employer] provide a full range of birth control options plus education and counseling. New patient visit and annual return visits include a complete examination, pap test as indicated, and other laboratory as indicated. Included is our [employer] for men.  Maternity Care is provided through pregnancy, including a six week post partum exam. Women who meet eligibility criteria for the Medicaid for Pregnant Women program, receive care free. Other women are charged on a sliding scale according to income. Note: [facility] provides services to pregnant women who have a Medicaid card. Call [phone] for an appointment in [place] or [phone] for an appointment in [facility].  Primary Care for Medicaid [place] Access Women is available through the [place]. As primary care provider for the [place] program, women may designate the [facility] clinic as their primary care provider.  PLEASE CALL [phone] FOR AN APPOINTMENT FOR THE ABOVE SERVICES IN EITHER [place] OR [place]. Information available in [place] and [place].    Childbirth Education Classes are open to the public and offered to help families prepare for the best possible childbirth experience as well as to promote lifelong health and wellness. Classes are offered  throughout the year and meet on the same night once a week for five weeks. Medicaid covers the cost of the classes for the mother-to-be and her partner. For participants without Medicaid, the cost of the class series is $45.00 for the mother-to-be and her partner. Class size is limited and registration is required. For more information or to register call [phone]. Baby items donated by [employer] and the [employer] [place] are given away during each class series.  SERVICES FOR WOMEN AND MEN Sexually Transmitted Infection appointments, including HIV testing, are available daily (weekdays, except holidays). Call early as same-day appointments are limited. For an appointment in either [facility] or [place], call [phone]. Services are confidential and free of charge.  Skin Testing for Tuberculosis Please call [phone]. Adult Immunizations are available, usually for a fee. Please call [phone] for details.  PLEASE CALL [phone] FOR AN APPOINTMENT FOR THE ABOVE SERVICES IN EITHER [place] OR [place].   [facility] provides up to the minute recommended vaccines for your travel destination. We also provide essential health and political information to help insure a safe and pleasurable travel experience. This program is self-sustaining, however, fees are very competitive. We are a CERTIFIED YELLOW FEVER IMMUNIZATION approved clinic site. PLEASE CALL [phone] FOR AN APPOINTMENT IN EITHER [place] OR [place].   If you have questions about the services listed above, we want to answer them! Email [place] at: [email or web address] Home Visiting Services for elderly and the disabled are available to residents of [facility] who are in need of care that compares  to the care offered by a nursing home, have needs that can be met by the program, and have [place] Medicaid. Other short term services are available to residents 18 years and older who are unable to meet requirements for eligibility to receive services from a certified home health agency, spend the majority of time at home, and need care for six months or less.  PLEASE CALL H548482 OR [phone] FOR MORE INFORMATION. Medication Assistance Program serves as a link between pharmaceutical companies and patients to provide low cost or free prescription medications. This servce is available for residents who meet certain income restrictions and have no insurance coverage.  PLEASE CALL [phone] ([place]) OR [phone] ([place]) FOR MORE INFORMATION.  Updated Feb. 21, 2013

## 2014-09-09 NOTE — ED Notes (Signed)
Pt c/o migraine, n/v and dizziness. Pt state she cannot eat or drink.

## 2014-09-09 NOTE — ED Provider Notes (Signed)
CSN: [id number]     Arrival date & time 09/09/14  1826 History   First MD Initiated Contact with Patient 09/09/14 1844     Chief Complaint  Patient presents with  . Migraine  . Nausea     (Consider location/radiation/quality/duration/timing/severity/associated sxs/prior Treatment) HPI   PCP: Ihor Gully Blood pressure 124/74, pulse 84, temperature 98.7 F (37.1 C), temperature source Oral, resp. rate 20, last menstrual period 07/25/2012, SpO2 100 %.  Yvonne [name] is a 29 y.o.female with a significant PMH of asthma, headache, arthritis, chronic back pain, depression and insomnia presents to the ER with complaints of a 3 day h/o insomnia.  She reports being under a lot of stress for the last several months with several family members having health problems and recently losing her job.  Three days ago she ran out of her seroquel, for which she takes for insomnia.  She has not slept or eaten the past 3 days and reports associated nausea, dry heaving, and a migraine headache .  She has tried to eat small amount of food but becomes sick to her stomach and vomits this back up.  Denies: weakness, change in vision, abdominal pain, back pain, fevers, chills, flank pain, vaginal bleeding or discharge, hematuria, dysuria or syncope.   Past Medical History  Diagnosis Date  . Asthma     albuterol inhaler  . Blood transfusion     2008 with c/s 4 units transfused  . Headache(784.0)     otc ibuprofen   . 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 Depression   . Chronic back pain    Past Surgical History  Procedure Laterality Date  . Appendectomy    . Cesarean section    . Cholecystectomy    . Laparoscopy    . Hand surgery    . Tonsillectomy      T & A  . Tubal ligation    . Endometrial ablation    . Laparoscopic hysterectomy Bilateral 09/29/2012    Procedure: HYSTERECTOMY TOTAL LAPAROSCOPIC;  Surgeon: Purcell Nails, MD;  Location: [facility];   Service: Gynecology;  Laterality: Bilateral;  Total Laparoscopic Hysterectomy, Bilateral Salpingectomies, Cystoscopy poss. LAVH poss. TAH  . Laparoscopic assisted vaginal hysterectomy N/A 09/29/2012    Procedure: LAPAROSCOPIC ASSISTED VAGINAL HYSTERECTOMY;  Surgeon: Purcell Nails, MD;  Location: [facility];  Service: Gynecology;  Laterality: N/A;  . Cystoscopy Bilateral 09/29/2012    Procedure: CYSTOSCOPY;  Surgeon: Purcell Nails, MD;  Location: [facility];  Service: Gynecology;  Laterality: Bilateral;  . Bilateral salpingectomy Bilateral 09/29/2012    Procedure: BILATERAL SALPINGECTOMY;  Surgeon: Purcell Nails, MD;  Location: [facility];  Service: Gynecology;  Laterality: Bilateral;  . Examination under anesthesia N/A 10/16/2012    Procedure: EXAM UNDER ANESTHESIA;  Surgeon: Purcell Nails, MD;  Location: [facility];  Service: Gynecology;  Laterality: N/A;  Suturing Vaginal Cuff  . Abdominal hysterectomy N/A 09/29/2012    Procedure: HYSTERECTOMY ABDOMINAL;  Surgeon: Purcell Nails, MD;  Location: [facility];  Service: Gynecology;  Laterality: N/A;   Family History  Problem Relation Age of Onset  . Diabetes Mother   . Diabetes Maternal Grandmother    History  Substance Use Topics  . Smoking status: Former Smoker -- 0.00 packs/day    Types: Cigarettes  . Smokeless tobacco: Never Used  . Alcohol Use: 0.6 oz/week    1 Glasses of wine per week  Comment: ocassionally   OB History    Gravida Para Term Preterm AB TAB SAB Ectopic Multiple Living   4 4 4       1      Review of Systems  10 Systems reviewed and are negative for acute change except as noted in the HPI.    Allergies  Bee venom; Reglan; and Aspirin  Home Medications   Prior to Admission medications   Medication Sig Start Date End Date Taking? Authorizing Provider  traZODone (DESYREL) 50 MG tablet Take 50 mg by mouth at bedtime.  07/26/14  Yes Historical Provider, MD  albuterol (PROVENTIL HFA;VENTOLIN HFA) 108 (90 BASE) MCG/ACT  inhaler Inhale 2 puffs into the lungs every 6 (six) hours as needed for wheezing or shortness of breath. Patient not taking: Reported on 09/09/2014 11/11/13   Renne Crigler, PA-C  cephALEXin (KEFLEX) 500 MG capsule Take 1 capsule (500 mg total) by mouth 4 (four) times daily. Patient not taking: Reported on 08/29/2014 03/08/14   Garlon Hatchet, PA-C  ibuprofen (ADVIL,MOTRIN) 800 MG tablet Take 1 tablet (800 mg total) by mouth every 8 (eight) hours as needed. Patient not taking: Reported on 03/08/2014 12/24/13   Charlestine Night, PA-C  metroNIDAZOLE (FLAGYL) 500 MG tablet Take 1 tablet (500 mg total) by mouth 3 (three) times daily. Patient not taking: Reported on 09/09/2014 08/29/14   Jerelyn Scott, MD  ondansetron (ZOFRAN ODT) 4 MG disintegrating tablet Take 1 tablet (4 mg total) by mouth every 8 (eight) hours as needed for nausea or vomiting. 09/09/14   [place] Sardo Neva Seat, PA-C  QUEtiapine (SEROQUEL XR) 300 MG 24 hr tablet Take 2 tablets (600 mg total) by mouth at bedtime. 09/09/14   Otho Michalik Neva Seat, PA-C   BP 134/94 mmHg  Pulse 79  Temp(Src) 98.7 F (37.1 C) (Oral)  Resp 18  SpO2 96%  LMP 07/25/2012 Physical Exam  Constitutional: She is oriented to person, place, and time. She appears well-developed and well-nourished. No distress.  HENT:  Head: Normocephalic and atraumatic.  Eyes: Pupils are equal, round, and reactive to light.  Neck: Normal range of motion. Neck supple.  Cardiovascular: Normal rate and regular rhythm.   Pulmonary/Chest: Effort normal.  Abdominal: Soft. Bowel sounds are normal. She exhibits no distension and no fluid wave. There is no tenderness. There is no rigidity, no rebound and no guarding.  Neurological: She is alert and oriented to person, place, and time. She has normal strength. No cranial nerve deficit or sensory deficit.  Skin: Skin is warm and dry.  Psychiatric: Her mood appears anxious. She exhibits a depressed mood. She expresses no suicidal (pt expresses no suicidal  ideations) ideation.  Pt is tearful.   Nursing note and vitals reviewed.   ED Course  Procedures (including critical care time) Labs Review Labs Reviewed  CBC WITH DIFFERENTIAL/PLATELET - Abnormal; Notable for the following:    WBC 12.8 (*)    Hemoglobin 11.6 (*)    HCT 35.6 (*)    Neutro Abs 8.9 (*)    All other components within normal limits  COMPREHENSIVE METABOLIC PANEL - Abnormal; Notable for the following:    Glucose, Bld 106 (*)    BUN 5 (*)    All other components within normal limits  LIPASE, BLOOD  I-STAT BETA HCG BLOOD, ED ([facility], [facility], [facility] ONLY)    Imaging Review No results found.   EKG Interpretation None      MDM   Final diagnoses:  Stress  Insomnia  Nausea  Other  migraine without status migrainosus, not intractable   After being given the Ativan, Zofran and fluids the patient fell asleep for a few hours in the ER. When she woke up she reported headache pain had resolved. Tolerated PO,   Sleep study ordered by PCP Psych appt on Monday or Tuesday  Ready for DC, Seroquel and Zofran.   Medications  sodium chloride 0.9 % bolus 1,000 mL (0 mLs Intravenous Stopped 09/09/14 2218)  LORazepam (ATIVAN) injection 0.5 mg (0.5 mg Intravenous Given 09/09/14 2004)  ondansetron (ZOFRAN) injection 4 mg (4 mg Intravenous Given 09/09/14 2003)    28 y.o.Yvonne [name] evaluation in the Emergency Department is complete. It has been determined that no acute conditions requiring further emergency intervention are present at this time. The patient/guardian have been advised of the diagnosis and plan. We have discussed signs and symptoms that warrant return to the ED, such as changes or worsening in symptoms.  Vital signs are stable at discharge. Filed Vitals:   09/09/14 2153  BP: 134/94  Pulse: 79  Temp:   Resp: 18    Patient/guardian has voiced understanding and agreed to follow-up with the PCP or specialist.   Marlon Pel, PA-C 09/12/14 1019  Derwood Kaplan, MD 09/12/14 2302

## 2014-10-05 ENCOUNTER — Emergency Department (HOSPITAL_COMMUNITY)
Admission: EM | Admit: 2014-10-05 | Discharge: 2014-10-05 | Disposition: A | Discharge status: Home / Self Care | Payer: Medicaid Other | Attending: Emergency Medicine | Admitting: Emergency Medicine

## 2014-10-05 ENCOUNTER — Encounter (HOSPITAL_COMMUNITY): Payer: Self-pay | Admitting: Nurse Practitioner

## 2014-10-05 DIAGNOSIS — G8929 Other chronic pain: Secondary | ICD-10-CM | POA: Insufficient documentation

## 2014-10-05 DIAGNOSIS — Z3202 Encounter for pregnancy test, result negative: Secondary | ICD-10-CM | POA: Insufficient documentation

## 2014-10-05 DIAGNOSIS — J45909 Unspecified asthma, uncomplicated: Secondary | ICD-10-CM | POA: Insufficient documentation

## 2014-10-05 DIAGNOSIS — M1990 Unspecified osteoarthritis, unspecified site: Secondary | ICD-10-CM | POA: Insufficient documentation

## 2014-10-05 DIAGNOSIS — F329 Major depressive disorder, single episode, unspecified: Secondary | ICD-10-CM | POA: Diagnosis not present

## 2014-10-05 DIAGNOSIS — N898 Other specified noninflammatory disorders of vagina: Secondary | ICD-10-CM | POA: Insufficient documentation

## 2014-10-05 DIAGNOSIS — Z87891 Personal history of nicotine dependence: Secondary | ICD-10-CM | POA: Insufficient documentation

## 2014-10-05 DIAGNOSIS — Z79899 Other long term (current) drug therapy: Secondary | ICD-10-CM | POA: Insufficient documentation

## 2014-10-05 LAB — URINALYSIS, ROUTINE W REFLEX MICROSCOPIC
Bilirubin Urine: NEGATIVE
Glucose, UA: NEGATIVE mg/dL
Ketones, ur: NEGATIVE mg/dL
Leukocytes, UA: NEGATIVE
NITRITE: NEGATIVE
PROTEIN: NEGATIVE mg/dL
Specific Gravity, Urine: 1.012 (ref 1.005–1.030)
UROBILINOGEN UA: 0.2 mg/dL (ref 0.0–1.0)
pH: 7 (ref 5.0–8.0)

## 2014-10-05 LAB — URINE MICROSCOPIC-ADD ON

## 2014-10-05 LAB — WET PREP, GENITAL
CLUE CELLS WET PREP: NONE SEEN
Trich, Wet Prep: NONE SEEN
YEAST WET PREP: NONE SEEN

## 2014-10-05 LAB — PREGNANCY, URINE: Preg Test, Ur: NEGATIVE

## 2014-10-05 NOTE — Discharge Instructions (Signed)
Your test for bacterial vaginosis and yeast was normal today. The cultures for std come back in 3 days. They will call you if it comes back positive

## 2014-10-05 NOTE — ED Notes (Signed)
Pt endorses "sweaty" vaginal odor x 1 week. sts has been using lavender scented tisuses x 9 days. Denies sexual intercourse, vaginal itching, discharge or pain.

## 2014-10-05 NOTE — ED Provider Notes (Signed)
CSN: [id number]     Arrival date & time 10/05/14  [id number] History   First MD Initiated Contact with Patient 10/05/14 [phone]     Chief Complaint  Patient presents with  . vaginal odor      (Consider location/radiation/quality/duration/timing/severity/associated sxs/prior Treatment) HPI Comments: Pt comes in with c/o vaginal odor. She denies discharge of pain. She states that it has been going on for 9 days. She states that she thinks it is related to changing toilet paper. She does have a history of std and bv.  The history is provided by the patient. No language interpreter was used.    Past Medical History  Diagnosis Date  . Asthma     albuterol inhaler  . Blood transfusion     2008 with c/s 4 units transfused  . Headache(784.0)     otc ibuprofen 800mg   . 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 Depression   . Chronic back pain    Past Surgical History  Procedure Laterality Date  . Appendectomy    . Cesarean section    . Cholecystectomy    . Laparoscopy    . Hand surgery    . Tonsillectomy      T & A  . Tubal ligation    . Endometrial ablation    . Laparoscopic hysterectomy Bilateral 09/29/2012    Procedure: HYSTERECTOMY TOTAL LAPAROSCOPIC;  Surgeon: Purcell NailsAngela Y Roberts, MD;  Location: [facility];  Service: Gynecology;  Laterality: Bilateral;  Total Laparoscopic Hysterectomy, Bilateral Salpingectomies, Cystoscopy poss. LAVH poss. TAH  . Laparoscopic assisted vaginal hysterectomy N/A 09/29/2012    Procedure: LAPAROSCOPIC ASSISTED VAGINAL HYSTERECTOMY;  Surgeon: Purcell NailsAngela Y Roberts, MD;  Location: [facility];  Service: Gynecology;  Laterality: N/A;  . Cystoscopy Bilateral 09/29/2012    Procedure: CYSTOSCOPY;  Surgeon: Purcell NailsAngela Y Roberts, MD;  Location: [facility];  Service: Gynecology;  Laterality: Bilateral;  . Bilateral salpingectomy Bilateral 09/29/2012    Procedure: BILATERAL SALPINGECTOMY;  Surgeon: Purcell NailsAngela Y Roberts, MD;  Location: [facility];  Service: Gynecology;   Laterality: Bilateral;  . Examination under anesthesia N/A 10/16/2012    Procedure: EXAM UNDER ANESTHESIA;  Surgeon: Purcell NailsAngela Y Roberts, MD;  Location: [facility];  Service: Gynecology;  Laterality: N/A;  Suturing Vaginal Cuff  . Abdominal hysterectomy N/A 09/29/2012    Procedure: HYSTERECTOMY ABDOMINAL;  Surgeon: Purcell NailsAngela Y Roberts, MD;  Location: [facility];  Service: Gynecology;  Laterality: N/A;   Family History  Problem Relation Age of Onset  . Diabetes Mother   . Diabetes Maternal Grandmother    History  Substance Use Topics  . Smoking status: Former Smoker -- 1.00 packs/day    Types: Cigarettes  . Smokeless tobacco: Never Used  . Alcohol Use: 0.6 oz/week    1 Glasses of wine per week     Comment: ocassionally   OB History    Gravida Para Term Preterm AB TAB SAB Ectopic Multiple Living   4 4 4       1      Review of Systems  All other systems reviewed and are negative.     Allergies  Bee venom; Reglan; and Aspirin  Home Medications   Prior to Admission medications   Medication Sig Start Date End Date Taking? Authorizing Provider  albuterol (PROVENTIL HFA;VENTOLIN HFA) 108 (90 BASE) MCG/ACT inhaler Inhale 2 puffs into the lungs every 6 (six) hours as needed for wheezing or shortness of breath. Patient not taking:  Reported on 09/09/2014 11/11/13   Renne Crigler, PA-C  cephALEXin (KEFLEX) 500 MG capsule Take 1 capsule (500 mg total) by mouth 4 (four) times daily. Patient not taking: Reported on 08/29/2014 03/08/14   Garlon Hatchet, PA-C  ibuprofen (ADVIL,MOTRIN) 800 MG tablet Take 1 tablet (800 mg total) by mouth every 8 (eight) hours as needed. Patient not taking: Reported on 03/08/2014 12/24/13   Charlestine Night, PA-C  metroNIDAZOLE (FLAGYL) 500 MG tablet Take 1 tablet (500 mg total) by mouth 3 (three) times daily. Patient not taking: Reported on 09/09/2014 08/29/14   Jerelyn Scott, MD  ondansetron (ZOFRAN ODT) 4 MG disintegrating tablet Take 1 tablet (4 mg total) by mouth every 8  (eight) hours as needed for nausea or vomiting. 09/09/14   [name], PA-C  QUEtiapine (SEROQUEL XR) 300 MG 24 hr tablet Take 2 tablets (600 mg total) by mouth at bedtime. 09/09/14   [name], PA-C  traZODone (DESYREL) 50 MG tablet Take 50 mg by mouth at bedtime.  07/26/14   Historical Provider, MD   BP 103/78 mmHg  Pulse 79  Temp(Src) 97.9 F (36.6 C) (Oral)  Resp 16  Ht 5' (1.524 m)  Wt 220 lb (99.791 kg)  BMI 42.97 kg/m2  SpO2 100%  LMP 07/25/2012 Physical Exam  Constitutional: She is oriented to person, place, and time. She appears well-developed and well-nourished.  Cardiovascular: Normal rate and regular rhythm.   Pulmonary/Chest: Effort normal and breath sounds normal.  Abdominal: Soft. Bowel sounds are normal. There is no tenderness.  Genitourinary:  Thick white discharge. No cmt  Musculoskeletal: Normal range of motion.  Neurological: She is alert and oriented to person, place, and time. Coordination normal.  Skin: Skin is warm and dry.  Psychiatric: She has a normal mood and affect.  Nursing note and vitals reviewed.   ED Course  Procedures (including critical care time) Labs Review Labs Reviewed  WET PREP, GENITAL - Abnormal; Notable for the following:    WBC, Wet Prep HPF POC FEW (*)    All other components within normal limits  URINALYSIS, ROUTINE W REFLEX MICROSCOPIC (NOT AT [facility]) - Abnormal; Notable for the following:    APPearance CLOUDY (*)    Hgb urine dipstick TRACE (*)    All other components within normal limits  URINE MICROSCOPIC-ADD ON - Abnormal; Notable for the following:    Squamous Epithelial / LPF MANY (*)    All other components within normal limits  PREGNANCY, URINE  HIV ANTIBODY (ROUTINE TESTING)  GC/CHLAMYDIA PROBE AMP ([place]) NOT AT [facility]    Imaging Review No results found.   EKG Interpretation None      MDM   Final diagnoses:  Vaginal odor    bv and yeast negative. Exam not consistent with std. Urine cultures  sent    Teressa Lower, NP 10/05/14 1108  Benjiman Core, MD 10/07/14 [phone]

## 2014-10-06 LAB — HIV ANTIBODY (ROUTINE TESTING W REFLEX): HIV SCREEN 4TH GENERATION: NONREACTIVE

## 2014-10-06 LAB — GC/CHLAMYDIA PROBE AMP (~~LOC~~) NOT AT ARMC
CHLAMYDIA, DNA PROBE: NEGATIVE
Neisseria Gonorrhea: NEGATIVE

## 2014-10-17 ENCOUNTER — Encounter (HOSPITAL_COMMUNITY): Payer: Self-pay | Admitting: *Deleted

## 2014-10-17 ENCOUNTER — Emergency Department (HOSPITAL_COMMUNITY): Payer: Medicaid Other

## 2014-10-17 ENCOUNTER — Emergency Department (HOSPITAL_COMMUNITY)
Admission: EM | Admit: 2014-10-17 | Discharge: 2014-10-17 | Disposition: A | Discharge status: Home / Self Care | Payer: Medicaid Other | Attending: Emergency Medicine | Admitting: Emergency Medicine

## 2014-10-17 DIAGNOSIS — X58XXXA Exposure to other specified factors, initial encounter: Secondary | ICD-10-CM | POA: Insufficient documentation

## 2014-10-17 DIAGNOSIS — Y998 Other external cause status: Secondary | ICD-10-CM | POA: Diagnosis not present

## 2014-10-17 DIAGNOSIS — J45909 Unspecified asthma, uncomplicated: Secondary | ICD-10-CM | POA: Diagnosis not present

## 2014-10-17 DIAGNOSIS — M545 Low back pain, unspecified: Secondary | ICD-10-CM

## 2014-10-17 DIAGNOSIS — S3992XA Unspecified injury of lower back, initial encounter: Secondary | ICD-10-CM | POA: Diagnosis present

## 2014-10-17 DIAGNOSIS — Y92512 Supermarket, store or market as the place of occurrence of the external cause: Secondary | ICD-10-CM | POA: Diagnosis not present

## 2014-10-17 DIAGNOSIS — M1990 Unspecified osteoarthritis, unspecified site: Secondary | ICD-10-CM | POA: Insufficient documentation

## 2014-10-17 DIAGNOSIS — F329 Major depressive disorder, single episode, unspecified: Secondary | ICD-10-CM | POA: Diagnosis not present

## 2014-10-17 DIAGNOSIS — Y9389 Activity, other specified: Secondary | ICD-10-CM | POA: Diagnosis not present

## 2014-10-17 DIAGNOSIS — G8929 Other chronic pain: Secondary | ICD-10-CM | POA: Diagnosis not present

## 2014-10-17 DIAGNOSIS — Z87891 Personal history of nicotine dependence: Secondary | ICD-10-CM | POA: Insufficient documentation

## 2014-10-17 MED ORDER — HYDROCODONE-ACETAMINOPHEN 5-325 MG PO TABS
2.0000 | ORAL_TABLET | Freq: Once | ORAL | Status: AC
  Administered 2014-10-17: 2 via ORAL
  Filled 2014-10-17: qty 2

## 2014-10-17 MED ORDER — KETOROLAC TROMETHAMINE 30 MG/ML IJ SOLN
30.0000 mg | Freq: Once | INTRAMUSCULAR | Status: DC
Start: 2014-10-17 — End: 2014-10-17

## 2014-10-17 MED ORDER — OXYCODONE-ACETAMINOPHEN 5-325 MG PO TABS: 2.0000 | ORAL_TABLET | ORAL | Status: DC | PRN
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
## 2014-10-17 NOTE — ED Notes (Signed)
Patient transported to X-ray 

## 2014-10-17 NOTE — ED Notes (Signed)
Pt reports heavy lifting and now has lower back pain, hx of same.

## 2014-10-17 NOTE — Discharge Instructions (Signed)
Back Exercises Follow-up with your primary care provider for referral to orthopedics or use the one that I had given you today. Return for any bowel or bladder incontinence or retention or any numbness or tingling in the lower extremities. Back exercises help treat and prevent back injuries. The goal of back exercises is to increase the strength of your abdominal and back muscles and the flexibility of your back. These exercises should be started when you no longer have back pain. Back exercises include:  Pelvic Tilt. Lie on your back with your knees bent. Tilt your pelvis until the lower part of your back is against the floor. Hold this position 5 to 10 sec and repeat 5 to 10 times.  Knee to Chest. Pull first 1 knee up against your chest and hold for 20 to 30 seconds, repeat this with the other knee, and then both knees. This may be done with the other leg straight or bent, whichever feels better.  Sit-Ups or Curl-Ups. Bend your knees 90 degrees. Start with tilting your pelvis, and do a partial, slow sit-up, lifting your trunk only 30 to 45 degrees off the floor. Take at least 2 to 3 seconds for each sit-up. Do not do sit-ups with your knees out straight. If partial sit-ups are difficult, simply do the above but with only tightening your abdominal muscles and holding it as directed.  Hip-Lift. Lie on your back with your knees flexed 90 degrees. Push down with your feet and shoulders as you raise your hips a couple inches off the floor; hold for 10 seconds, repeat 5 to 10 times.  Back arches. Lie on your stomach, propping yourself up on bent elbows. Slowly press on your hands, causing an arch in your low back. Repeat 3 to 5 times. Any initial stiffness and discomfort should lessen with repetition over time.  Shoulder-Lifts. Lie face down with arms beside your body. Keep hips and torso pressed to floor as you slowly lift your head and shoulders off the floor. Do not overdo your exercises, especially in  the beginning. Exercises may cause you some mild back discomfort which lasts for a few minutes; however, if the pain is more severe, or lasts for more than 15 minutes, do not continue exercises until you see your caregiver. Improvement with exercise therapy for back problems is slow.  See your caregivers for assistance with developing a proper back exercise program. Document Released: 04/26/2004 Document Revised: 06/11/2011 Document Reviewed: 01/18/2011 [facility] Patient Information 2015 [employer]. This information is not intended to replace advice given to you by your health care provider. Make sure you discuss any questions you have with your health care provider.

## 2014-10-27 ENCOUNTER — Other Ambulatory Visit (HOSPITAL_COMMUNITY): Payer: Self-pay | Admitting: Specialist

## 2014-11-02 ENCOUNTER — Emergency Department (HOSPITAL_COMMUNITY)
Admission: EM | Admit: 2014-11-02 | Discharge: 2014-11-02 | Disposition: A | Discharge status: Home / Self Care | Payer: Medicaid Other | Attending: Emergency Medicine | Admitting: Emergency Medicine

## 2014-11-02 ENCOUNTER — Encounter (HOSPITAL_COMMUNITY): Payer: Self-pay | Admitting: *Deleted

## 2014-11-02 DIAGNOSIS — G43909 Migraine, unspecified, not intractable, without status migrainosus: Secondary | ICD-10-CM | POA: Insufficient documentation

## 2014-11-02 DIAGNOSIS — R51 Headache: Secondary | ICD-10-CM | POA: Diagnosis present

## 2014-11-02 DIAGNOSIS — M1990 Unspecified osteoarthritis, unspecified site: Secondary | ICD-10-CM | POA: Insufficient documentation

## 2014-11-02 DIAGNOSIS — Z87891 Personal history of nicotine dependence: Secondary | ICD-10-CM | POA: Insufficient documentation

## 2014-11-02 DIAGNOSIS — G43009 Migraine without aura, not intractable, without status migrainosus: Secondary | ICD-10-CM

## 2014-11-02 DIAGNOSIS — F329 Major depressive disorder, single episode, unspecified: Secondary | ICD-10-CM | POA: Insufficient documentation

## 2014-11-02 DIAGNOSIS — G8929 Other chronic pain: Secondary | ICD-10-CM | POA: Insufficient documentation

## 2014-11-02 DIAGNOSIS — J45909 Unspecified asthma, uncomplicated: Secondary | ICD-10-CM | POA: Diagnosis not present

## 2014-11-02 MED ORDER — PROCHLORPERAZINE MALEATE 5 MG PO TABS
5.0000 mg | ORAL_TABLET | Freq: Once | ORAL | Status: DC
  Filled 2014-11-02 (×2): qty 1

## 2014-11-02 MED ORDER — KETOROLAC TROMETHAMINE 30 MG/ML IJ SOLN
30.0000 mg | Freq: Once | INTRAMUSCULAR | Status: AC
  Administered 2014-11-02: 30 mg via INTRAVENOUS
  Filled 2014-11-02: qty 1

## 2014-11-02 MED ORDER — DIPHENHYDRAMINE HCL 25 MG PO CAPS
25.0000 mg | ORAL_CAPSULE | Freq: Once | ORAL | Status: AC
  Administered 2014-11-02: 25 mg via ORAL
  Filled 2014-11-02: qty 1

## 2014-11-02 NOTE — Discharge Instructions (Signed)
Migraine Headache A migraine headache is very bad, throbbing pain on one or both sides of your head. Talk to your doctor about what things may bring on (trigger) your migraine headaches. HOME CARE  Only take medicines as told by your doctor.  Lie down in a dark, quiet room when you have a migraine.  Keep a journal to find out if certain things bring on migraine headaches. For example, write down:  What you eat and drink.  How much sleep you get.  Any change to your diet or medicines.  Lessen how much alcohol you drink.  Quit smoking if you smoke.  Get enough sleep.  Lessen any stress in your life.  Keep lights dim if bright lights bother you or make your migraines worse. GET HELP RIGHT AWAY IF:   Your migraine becomes really bad.  You have a fever.  You have a stiff neck.  You have trouble seeing.  Your muscles are weak, or you lose muscle control.  You lose your balance or have trouble walking.  You feel like you will pass out (faint), or you pass out.  You have really bad symptoms that are different than your first symptoms. MAKE SURE YOU:   Understand these instructions.  Will watch your condition.  Will get help right away if you are not doing well or get worse. Document Released: 12/27/2007 Document Revised: 06/11/2011 Document Reviewed: 11/24/2012 [facility] Patient Information 2015 [place], [place]. This information is not intended to replace advice given to you by your health care provider. Make sure you discuss any questions you have with your health care provider.  Please monitor for new or worsening signs or symptoms, follow-up immediately if any present. Please contact her primary care provider inform them of your visit today. Please contact them and regards to your chronic daily medications.

## 2014-11-02 NOTE — ED Notes (Signed)
PA at bedside.

## 2014-11-02 NOTE — ED Notes (Signed)
Pt reports having a migraine with nausea x 2 days, hx of migraines. Pt also having burning pain to left eye, redness noted.

## 2014-11-02 NOTE — ED Provider Notes (Signed)
CSN: [id number]     Arrival date & time 11/02/14  1236 History   First MD Initiated Contact with Patient 11/02/14 1415     Chief Complaint  Patient presents with  . Migraine  . Eye Problem    HPI   29 year old female with a history of migraines presents today with a migraine. Patient reports left-sided head pain with radiation into her left eye, described as throbbing. She reports associated photophobia, nausea. Patient reports these symptoms are typical of her migraines, denies changes in vision, trauma, focal neurological deficits, vomiting, fever, neck stiffness, or any other concerning signs or symptoms. Patient reports that when she gets migraines she is treated in the ED, with good symptom resolution. Patient reports she has not tried any over-the-counter medications at home. Patient additionally notes that she has run out of her Seroquel and requests enough medication until she can have her prescriptions by Medicaid. Patient reports that last time she was in the emergency room she was given enough medication until her prescription be covered. She reports she takes Seroquel at night for sleep, states that she's contacted her primary care provider to have her prescriptions refilled but is unable to have them refilled because she cannot afford them without the insurance.   Past Medical History  Diagnosis Date  . Asthma     albuterol inhaler  . Blood transfusion     2008 with c/s 4 units transfused  . Headache(784.0)     otc ibuprofen 800mg   . 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 Depression   . Chronic back pain    Past Surgical History  Procedure Laterality Date  . Appendectomy    . Cesarean section    . Cholecystectomy    . Laparoscopy    . Hand surgery    . Tonsillectomy      T & A  . Tubal ligation    . Endometrial ablation    . Laparoscopic hysterectomy Bilateral 09/29/2012    Procedure: HYSTERECTOMY TOTAL LAPAROSCOPIC;  Surgeon: Purcell Nails, MD;  Location: [facility];  Service: Gynecology;  Laterality: Bilateral;  Total Laparoscopic Hysterectomy, Bilateral Salpingectomies, Cystoscopy poss. LAVH poss. TAH  . Laparoscopic assisted vaginal hysterectomy N/A 09/29/2012    Procedure: LAPAROSCOPIC ASSISTED VAGINAL HYSTERECTOMY;  Surgeon: Purcell Nails, MD;  Location: [facility];  Service: Gynecology;  Laterality: N/A;  . Cystoscopy Bilateral 09/29/2012    Procedure: CYSTOSCOPY;  Surgeon: Purcell Nails, MD;  Location: [facility];  Service: Gynecology;  Laterality: Bilateral;  . Bilateral salpingectomy Bilateral 09/29/2012    Procedure: BILATERAL SALPINGECTOMY;  Surgeon: Purcell Nails, MD;  Location: [facility];  Service: Gynecology;  Laterality: Bilateral;  . Examination under anesthesia N/A 10/16/2012    Procedure: EXAM UNDER ANESTHESIA;  Surgeon: Purcell Nails, MD;  Location: [facility];  Service: Gynecology;  Laterality: N/A;  Suturing Vaginal Cuff  . Abdominal hysterectomy N/A 09/29/2012    Procedure: HYSTERECTOMY ABDOMINAL;  Surgeon: Purcell Nails, MD;  Location: [facility];  Service: Gynecology;  Laterality: N/A;   Family History  Problem Relation Age of Onset  . Diabetes Mother   . Diabetes Maternal Grandmother    History  Substance Use Topics  . Smoking status: Former Smoker -- 1.00 packs/day    Types: Cigarettes  . Smokeless tobacco: Never Used  . Alcohol Use: 0.6 oz/week    1 Glasses of wine per week  Comment: ocassionally   OB History    Gravida Para Term Preterm AB TAB SAB Ectopic Multiple Living   Review of Systems  All other systems reviewed and are negative.   Allergies  Bee venom; Reglan; and Aspirin  Home Medications   Prior to Admission medications   Medication Sig Start Date End Date Taking? Authorizing Provider  albuterol (PROVENTIL HFA;VENTOLIN HFA) 108 (90 BASE) MCG/ACT inhaler Inhale 2 puffs into the lungs every 6 (six) hours as needed for wheezing or shortness of breath. Patient  not taking: Reported on 09/09/2014 11/11/13   Renne Crigler, PA-C  cephALEXin (KEFLEX) 500 MG capsule Take 1 capsule (500 mg total) by mouth 4 (four) times daily. Patient not taking: Reported on 08/29/2014 03/08/14   Garlon Hatchet, PA-C  ibuprofen (ADVIL,MOTRIN) 800 MG tablet Take 1 tablet (800 mg total) by mouth every 8 (eight) hours as needed. Patient not taking: Reported on 03/08/2014 12/24/13   Charlestine Night, PA-C  metroNIDAZOLE (FLAGYL) 500 MG tablet Take 1 tablet (500 mg total) by mouth 3 (three) times daily. Patient not taking: Reported on 09/09/2014 08/29/14   Jerelyn Scott, MD  ondansetron (ZOFRAN ODT) 4 MG disintegrating tablet Take 1 tablet (4 mg total) by mouth every 8 (eight) hours as needed for nausea or vomiting. Patient not taking: Reported on 10/05/2014 09/09/14   Marlon Pel, PA-C  oxyCODONE-acetaminophen (PERCOCET) 5-325 MG per tablet Take 2 tablets by mouth every 4 (four) hours as needed for severe pain. 10/17/14   Hanna Patel-Mills, PA-C  QUEtiapine (SEROQUEL XR) 300 MG 24 hr tablet Take 2 tablets (600 mg total) by mouth at bedtime. 09/09/14   [name], PA-C  QUEtiapine (SEROQUEL) 300 MG tablet Take 300 mg by mouth at bedtime. 10/11/14   Historical Provider, MD  traZODone (DESYREL) 50 MG tablet Take 50 mg by mouth at bedtime.  07/26/14   Historical Provider, MD   BP 133/82 mmHg  Pulse 73  Temp(Src) 98.5 F (36.9 C) (Oral)  Resp 18  Ht 5' (1.524 m)  Wt 227 lb (102.967 kg)  BMI 44.33 kg/m2  SpO2 100%  LMP 07/25/2012    Physical Exam  Constitutional: She is oriented to person, place, and time. She appears well-developed and well-nourished.  HENT:  Head: Normocephalic and atraumatic.  Eyes: Conjunctivae and EOM are normal. Pupils are equal, round, and reactive to light. Right eye exhibits no discharge. Left eye exhibits no discharge. Right conjunctiva is not injected. Right conjunctiva has no hemorrhage. Left conjunctiva is not injected. Left conjunctiva has no hemorrhage.  No scleral icterus. Right eye exhibits normal extraocular motion and no nystagmus. Left eye exhibits normal extraocular motion and no nystagmus.  Neck: Normal range of motion. No JVD present. No tracheal deviation present.  Cardiovascular: Normal rate, regular rhythm, normal heart sounds and intact distal pulses.  Exam reveals no gallop and no friction rub.   No murmur heard. Pulmonary/Chest: Effort normal and breath sounds normal. No stridor. No respiratory distress. She has no wheezes. She has no rales. She exhibits no tenderness.  Neurological: She is alert and oriented to person, place, and time. She has normal strength. No cranial nerve deficit or sensory deficit. Coordination and gait normal. GCS eye subscore is 4. GCS verbal subscore is 5. GCS motor subscore is 6.  Psychiatric: She has a normal mood and affect. Her behavior is normal. Judgment and thought content normal.  Nursing note and vitals  reviewed.     ED Course  Procedures (including critical care time) Labs Review Labs Reviewed - No data to display  Imaging Review No results found.   EKG Interpretation None      MDM   Final diagnoses:  Nonintractable migraine, unspecified migraine type    Labs:  Imaging:  Consults:  Therapeutics: Benadryl, Toradol  Discharge Meds:   Assessment/Plan: Patient presents with migraine, this is typical presentation of her previous migraines. She has no red flags. She was treated here in the ED with good symptom resolution. Patient had not yet received the Compazine that I ordered, but reports that symptoms were rapidly improving, and that she had similar to be embolectomy discharge. Patient requests enough Seroquel to get her until Medicaid can pay for her new prescription. She reports she's had this done before in the emergency room. Chart review so she was given Seroquel at a previous visit in June 2016. I informed the patient that I would not be prescribing her Seroquel or giving  her a take-home dose. I informed her that it is not appropriate for [place] to continue giving her medications when she is taking more than she is instructed to, and that we cannot continue to provide her daily medications. I informed her to contact her primary care provider inform them of her inability to get the Seroquel refilled, and having immediate follow-up evaluation. Nursing note reports left eye pain with redness, no signs of redness on my physical exam, she reports radiation of headache into her eye, no changes in vision. Patient verbalized her understanding and agreement to today's plan and had no further questions or concerns at the time of discharge         Eyvonne Mechanic, PA-C 11/02/14 1542  Pricilla Loveless, MD 11/04/14 [phone]

## 2014-11-03 ENCOUNTER — Encounter (HOSPITAL_COMMUNITY): Payer: Self-pay

## 2014-11-03 ENCOUNTER — Encounter (HOSPITAL_COMMUNITY)
Admission: RE | Admit: 2014-11-03 | Discharge: 2014-11-03 | Disposition: A | Discharge status: Home / Self Care | Payer: Medicaid Other | Source: Ambulatory Visit | Attending: Specialist | Admitting: Specialist

## 2014-11-03 DIAGNOSIS — M5126 Other intervertebral disc displacement, lumbar region: Secondary | ICD-10-CM | POA: Diagnosis not present

## 2014-11-03 DIAGNOSIS — Z01812 Encounter for preprocedural laboratory examination: Secondary | ICD-10-CM | POA: Diagnosis not present

## 2014-11-03 LAB — CBC
HCT: 36.4 % (ref 36.0–46.0)
Hemoglobin: 11.9 g/dL — ABNORMAL LOW (ref 12.0–15.0)
MCH: 28 pg (ref 26.0–34.0)
MCHC: 32.7 g/dL (ref 30.0–36.0)
MCV: 85.6 fL (ref 78.0–100.0)
Platelets: 310 10*3/uL (ref 150–400)
RBC: 4.25 MIL/uL (ref 3.87–5.11)
RDW: 13 % (ref 11.5–15.5)
WBC: 11.2 10*3/uL — AB (ref 4.0–10.5)

## 2014-11-03 LAB — COMPREHENSIVE METABOLIC PANEL
ALT: 30 U/L (ref 14–54)
AST: 29 U/L (ref 15–41)
Albumin: 3.9 g/dL (ref 3.5–5.0)
Alkaline Phosphatase: 121 U/L (ref 38–126)
Anion gap: 9 (ref 5–15)
BUN: 5 mg/dL — AB (ref 6–20)
CALCIUM: 9.2 mg/dL (ref 8.9–10.3)
CO2: 26 mmol/L (ref 22–32)
Chloride: 103 mmol/L (ref 101–111)
Creatinine, Ser: 0.72 mg/dL (ref 0.44–1.00)
GFR calc Af Amer: >60 mL/min (ref >60)
Glucose, Bld: 105 mg/dL — ABNORMAL HIGH (ref 65–99)
Potassium: 3.7 mmol/L (ref 3.5–5.1)
Sodium: 138 mmol/L (ref 135–145)
TOTAL PROTEIN: 7.6 g/dL (ref 6.5–8.1)
Total Bilirubin: 0.4 mg/dL (ref 0.3–1.2)

## 2014-11-03 LAB — SURGICAL PCR SCREEN
MRSA, PCR: NEGATIVE
STAPHYLOCOCCUS AUREUS: NEGATIVE

## 2014-11-03 NOTE — Pre-Procedure Instructions (Signed)
Yvonne [name]  11/03/2014      [id number] [place], [place] - [phone] AT [facility] OF [address] [place] [address] Phone: [phone] Fax: [phone]    Your procedure is scheduled on Friday, November 05, 2014   Report to [facility] Admitting at 10:30 A.M.  Call this number if you have problems the morning of surgery:  [phone]   Remember:  Do not eat food or drink liquids after midnight Thursday, November 04, 2014  Take these medicines the morning of surgery with A SIP OF WATER: if needed: oxyCODONE-acetaminophen (PERCOCET) for pain, albuterol  Stop taking Aspirin,vitamins and herbal medications. Do not take any NSAIDs ie: Ibuprofen, Advil, Naproxen or any medication containing Aspirin; stop now.   Do not wear jewelry, make-up or nail polish.  Do not wear lotions, powders, or perfumes.  You may not deodorant.  Do not shave 48 hours prior to surgery.    Do not bring valuables to the hospital.  [facility] is not responsible for any belongings or valuables.  Contacts, dentures or bridgework may not be worn into surgery.  Leave your suitcase in the car.  After surgery it may be brought to your room.  For patients admitted to the hospital, discharge time will be determined by your treatment team.  Patients discharged the day of surgery will not be allowed to drive home.   Name and phone number of your driver:   Special instructions:  Special Instructions:Special Instructions: [facility] - Preparing for Surgery  Before surgery, you can play an important role.  Because skin is not sterile, your skin needs to be as free of germs as possible.  You can reduce the number of germs on you skin by washing with CHG (chlorahexidine gluconate) soap before surgery.  CHG is an antiseptic cleaner which kills germs and bonds with the skin to continue killing germs even after washing.  Please DO NOT use if you have an allergy  to CHG or antibacterial soaps.  If your skin becomes reddened/irritated stop using the CHG and inform your nurse when you arrive at Short Stay.  Do not shave (including legs and underarms) for at least 48 hours prior to the first CHG shower.  You may shave your face.  Please follow these instructions carefully:   1.  Shower with CHG Soap the night before surgery and the morning of Surgery.  2.  If you choose to wash your hair, wash your hair first as usual with your normal shampoo.  3.  After you shampoo, rinse your hair and body thoroughly to remove the Shampoo.  4.  Use CHG as you would any other liquid soap.  You can apply chg directly  to the skin and wash gently with scrungie or a clean washcloth.  5.  Apply the CHG Soap to your body ONLY FROM THE NECK DOWN.  Do not use on open wounds or open sores.  Avoid contact with your eyes, ears, mouth and genitals (private parts).  Wash genitals (private parts) with your normal soap.  6.  Wash thoroughly, paying special attention to the area where your surgery will be performed.  7.  Thoroughly rinse your body with warm water from the neck down.  8.  DO NOT shower/wash with your normal soap after using and rinsing off the CHG Soap.  9.  Pat yourself dry with a clean towel.  10.  Wear clean pajamas.            11.  Place clean sheets on your bed the night of your first shower and do not sleep with pets.  Day of Surgery  Do not apply any lotions/deodoants the morning of surgery.  Please wear clean clothes to the hospital/surgery center.  Please read over the following fact sheets that you were given. Pain Booklet, Coughing and Deep Breathing, MRSA Information and Surgical Site Infection Prevention

## 2014-11-05 ENCOUNTER — Encounter (HOSPITAL_COMMUNITY)
Admission: RE | Disposition: A | Discharge status: Home / Self Care | Payer: Self-pay | Source: Ambulatory Visit | Attending: Specialist

## 2014-11-05 ENCOUNTER — Encounter (HOSPITAL_COMMUNITY): Payer: Self-pay | Admitting: *Deleted

## 2014-11-05 ENCOUNTER — Observation Stay (HOSPITAL_COMMUNITY)
Admission: RE | Admit: 2014-11-05 | Discharge: 2014-11-06 | Disposition: A | Discharge status: Home / Self Care | Payer: Medicaid Other | Source: Ambulatory Visit | Attending: Specialist | Admitting: Specialist

## 2014-11-05 ENCOUNTER — Ambulatory Visit (HOSPITAL_COMMUNITY): Payer: Medicaid Other | Admitting: Anesthesiology

## 2014-11-05 ENCOUNTER — Ambulatory Visit (HOSPITAL_COMMUNITY): Payer: Medicaid Other

## 2014-11-05 DIAGNOSIS — M5126 Other intervertebral disc displacement, lumbar region: Principal | ICD-10-CM | POA: Diagnosis present

## 2014-11-05 DIAGNOSIS — Z419 Encounter for procedure for purposes other than remedying health state, unspecified: Secondary | ICD-10-CM

## 2014-11-05 HISTORY — PX: LUMBAR LAMINECTOMY: SHX95

## 2014-11-05 SURGERY — MICRODISCECTOMY LUMBAR LAMINECTOMY
Anesthesia: General | Site: Back

## 2014-11-05 MED ORDER — QUETIAPINE FUMARATE ER 300 MG PO TB24
600.0000 mg | ORAL_TABLET | Freq: Every day | ORAL | Status: DC
  Administered 2014-11-05: 600 mg via ORAL
  Filled 2014-11-05 (×2): qty 2

## 2014-11-05 MED ORDER — METHOCARBAMOL 500 MG PO TABS
500.0000 mg | ORAL_TABLET | Freq: Four times a day (QID) | ORAL | Status: DC | PRN
  Administered 2014-11-05: 500 mg via ORAL
  Filled 2014-11-05: qty 1

## 2014-11-05 MED ORDER — OXYCODONE HCL 5 MG/5ML PO SOLN: 5.0000 mg | Freq: Once | ORAL | Status: AC | PRN

## 2014-11-05 MED ORDER — DOCUSATE SODIUM 100 MG PO CAPS
100.0000 mg | ORAL_CAPSULE | Freq: Two times a day (BID) | ORAL | Status: DC
  Administered 2014-11-05 – 2014-11-06 (×2): 100 mg via ORAL
  Filled 2014-11-05 (×2): qty 1

## 2014-11-05 MED ORDER — HYDROCODONE-ACETAMINOPHEN 5-325 MG PO TABS
1.0000 | ORAL_TABLET | ORAL | Status: DC | PRN
  Administered 2014-11-05 – 2014-11-06 (×3): 2 via ORAL
  Filled 2014-11-05 (×3): qty 2

## 2014-11-05 MED ORDER — OXYCODONE HCL 5 MG PO TABS
5.0000 mg | ORAL_TABLET | Freq: Once | ORAL | Status: AC | PRN
  Administered 2014-11-05: 5 mg via ORAL

## 2014-11-05 MED ORDER — BUPIVACAINE LIPOSOME 1.3 % IJ SUSP
20.0000 mL | INTRAMUSCULAR | Status: DC
  Filled 2014-11-05: qty 20

## 2014-11-05 MED ORDER — LACTATED RINGERS IV SOLN
INTRAVENOUS | Status: DC | PRN
  Administered 2014-11-05 (×2): via INTRAVENOUS

## 2014-11-05 MED ORDER — ONDANSETRON 4 MG PO TBDP: 4.0000 mg | ORAL_TABLET | Freq: Three times a day (TID) | ORAL | Status: DC | PRN

## 2014-11-05 MED ORDER — FENTANYL CITRATE (PF) 100 MCG/2ML IJ SOLN
INTRAMUSCULAR | Status: DC | PRN
  Administered 2014-11-05: 100 ug via INTRAVENOUS
  Administered 2014-11-05: 50 ug via INTRAVENOUS

## 2014-11-05 MED ORDER — CEFAZOLIN SODIUM-DEXTROSE 2-3 GM-% IV SOLR
2.0000 g | Freq: Three times a day (TID) | INTRAVENOUS | Status: AC
  Administered 2014-11-05 – 2014-11-06 (×2): 2 g via INTRAVENOUS
  Filled 2014-11-05 (×2): qty 50

## 2014-11-05 MED ORDER — LIDOCAINE HCL (CARDIAC) 20 MG/ML IV SOLN
INTRAVENOUS | Status: DC | PRN
  Administered 2014-11-05: 100 mg via INTRAVENOUS

## 2014-11-05 MED ORDER — ONDANSETRON HCL 4 MG/2ML IJ SOLN: 4.0000 mg | Freq: Four times a day (QID) | INTRAMUSCULAR | Status: DC | PRN

## 2014-11-05 MED ORDER — SODIUM CHLORIDE 0.9 % IJ SOLN
3.0000 mL | Freq: Two times a day (BID) | INTRAMUSCULAR | Status: DC
  Administered 2014-11-05: 3 mL via INTRAVENOUS

## 2014-11-05 MED ORDER — GELATIN ABSORBABLE 100 EX MISC
CUTANEOUS | Status: DC | PRN
  Administered 2014-11-05: 20 mL via TOPICAL

## 2014-11-05 MED ORDER — MENTHOL 3 MG MT LOZG: 1.0000 | LOZENGE | OROMUCOSAL | Status: DC | PRN

## 2014-11-05 MED ORDER — HEMOSTATIC AGENTS (NO CHARGE) OPTIME
TOPICAL | Status: DC | PRN
  Administered 2014-11-05: 1 via TOPICAL

## 2014-11-05 MED ORDER — 0.9 % SODIUM CHLORIDE (POUR BTL) OPTIME
TOPICAL | Status: DC | PRN
  Administered 2014-11-05: 1000 mL

## 2014-11-05 MED ORDER — ONDANSETRON HCL 4 MG/2ML IJ SOLN
4.0000 mg | INTRAMUSCULAR | Status: DC | PRN
  Administered 2014-11-06: 4 mg via INTRAVENOUS
  Filled 2014-11-05: qty 2

## 2014-11-05 MED ORDER — SODIUM CHLORIDE 0.9 % IV SOLN: 250.0000 mL | INTRAVENOUS | Status: DC

## 2014-11-05 MED ORDER — MIDAZOLAM HCL 5 MG/5ML IJ SOLN
INTRAMUSCULAR | Status: DC | PRN
  Administered 2014-11-05: 2 mg via INTRAVENOUS

## 2014-11-05 MED ORDER — HYDROMORPHONE HCL 1 MG/ML IJ SOLN
INTRAMUSCULAR | Status: AC
  Filled 2014-11-05: qty 1

## 2014-11-05 MED ORDER — QUETIAPINE FUMARATE 300 MG PO TABS
300.0000 mg | ORAL_TABLET | Freq: Every day | ORAL | Status: DC
  Filled 2014-11-05: qty 1

## 2014-11-05 MED ORDER — ONDANSETRON HCL 4 MG/2ML IJ SOLN
INTRAMUSCULAR | Status: DC | PRN
  Administered 2014-11-05: 12 mg via INTRAVENOUS

## 2014-11-05 MED ORDER — ALUM & MAG HYDROXIDE-SIMETH 200-200-20 MG/5ML PO SUSP: 30.0000 mL | Freq: Four times a day (QID) | ORAL | Status: DC | PRN

## 2014-11-05 MED ORDER — SODIUM CHLORIDE 0.9 % IJ SOLN: 3.0000 mL | INTRAMUSCULAR | Status: DC | PRN

## 2014-11-05 MED ORDER — LACTATED RINGERS IV SOLN
INTRAVENOUS | Status: DC
  Administered 2014-11-05: 11:00:00 via INTRAVENOUS

## 2014-11-05 MED ORDER — METHOCARBAMOL 1000 MG/10ML IJ SOLN
500.0000 mg | Freq: Four times a day (QID) | INTRAVENOUS | Status: DC | PRN
  Filled 2014-11-05: qty 5

## 2014-11-05 MED ORDER — BISACODYL 5 MG PO TBEC: 5.0000 mg | DELAYED_RELEASE_TABLET | Freq: Every day | ORAL | Status: DC | PRN

## 2014-11-05 MED ORDER — PHENOL 1.4 % MT LIQD: 1.0000 | OROMUCOSAL | Status: DC | PRN

## 2014-11-05 MED ORDER — BUPIVACAINE HCL 0.5 % IJ SOLN: INTRAMUSCULAR | Status: DC | PRN

## 2014-11-05 MED ORDER — CEFAZOLIN SODIUM-DEXTROSE 2-3 GM-% IV SOLR
INTRAVENOUS | Status: AC
  Administered 2014-11-05: 3 g via INTRAVENOUS
  Filled 2014-11-05: qty 50

## 2014-11-05 MED ORDER — POLYETHYLENE GLYCOL 3350 17 G PO PACK: 17.0000 g | PACK | Freq: Every day | ORAL | Status: DC | PRN

## 2014-11-05 MED ORDER — KETOROLAC TROMETHAMINE 30 MG/ML IJ SOLN
INTRAMUSCULAR | Status: DC | PRN
  Administered 2014-11-05: 30 mg via INTRAVENOUS

## 2014-11-05 MED ORDER — BUPIVACAINE LIPOSOME 1.3 % IJ SUSP
INTRAMUSCULAR | Status: DC | PRN
  Administered 2014-11-05: 15 mL

## 2014-11-05 MED ORDER — MORPHINE SULFATE 2 MG/ML IJ SOLN: 1.0000 mg | INTRAMUSCULAR | Status: DC | PRN

## 2014-11-05 MED ORDER — ACETAMINOPHEN 325 MG PO TABS
650.0000 mg | ORAL_TABLET | ORAL | Status: DC | PRN
Start: 2014-11-05 — End: 2014-11-06
  Administered 2014-11-06: 650 mg via ORAL
  Filled 2014-11-05: qty 2

## 2014-11-05 MED ORDER — OXYCODONE HCL 5 MG PO TABS
ORAL_TABLET | ORAL | Status: AC
  Filled 2014-11-05: qty 1

## 2014-11-05 MED ORDER — FLEET ENEMA 7-19 GM/118ML RE ENEM: 1.0000 | ENEMA | Freq: Once | RECTAL | Status: AC | PRN

## 2014-11-05 MED ORDER — FENTANYL CITRATE (PF) 250 MCG/5ML IJ SOLN
INTRAMUSCULAR | Status: AC
  Filled 2014-11-05: qty 5

## 2014-11-05 MED ORDER — HYDROMORPHONE HCL 1 MG/ML IJ SOLN
0.2500 mg | INTRAMUSCULAR | Status: DC | PRN
  Administered 2014-11-05 (×2): 0.5 mg via INTRAVENOUS

## 2014-11-05 MED ORDER — HYDROMORPHONE HCL 1 MG/ML IJ SOLN
INTRAMUSCULAR | Status: DC | PRN
  Administered 2014-11-05: .4 mg via INTRAVENOUS
  Administered 2014-11-05: .2 mg via INTRAVENOUS

## 2014-11-05 MED ORDER — ROCURONIUM BROMIDE 100 MG/10ML IV SOLN
INTRAVENOUS | Status: DC | PRN
  Administered 2014-11-05: 50 mg via INTRAVENOUS

## 2014-11-05 MED ORDER — BUPIVACAINE-EPINEPHRINE (PF) 0.25% -1:200000 IJ SOLN
INTRAMUSCULAR | Status: AC
  Filled 2014-11-05: qty 30

## 2014-11-05 MED ORDER — OXYCODONE-ACETAMINOPHEN 5-325 MG PO TABS: 1.0000 | ORAL_TABLET | ORAL | Status: DC | PRN

## 2014-11-05 MED ORDER — OXYCODONE-ACETAMINOPHEN 5-325 MG PO TABS
1.0000 | ORAL_TABLET | ORAL | Status: DC | PRN
  Administered 2014-11-06: 2 via ORAL
  Filled 2014-11-05: qty 2

## 2014-11-05 MED ORDER — THROMBIN 20000 UNITS EX SOLR
CUTANEOUS | Status: AC
  Filled 2014-11-05: qty 20000

## 2014-11-05 MED ORDER — ONDANSETRON 4 MG PO TBDP
4.0000 mg | ORAL_TABLET | Freq: Three times a day (TID) | ORAL | Status: DC | PRN
  Filled 2014-11-05: qty 1

## 2014-11-05 MED ORDER — FENTANYL CITRATE (PF) 100 MCG/2ML IJ SOLN
50.0000 ug | INTRAMUSCULAR | Status: DC | PRN
  Administered 2014-11-05: 50 ug via INTRAVENOUS

## 2014-11-05 MED ORDER — DEXAMETHASONE SODIUM PHOSPHATE 4 MG/ML IJ SOLN
INTRAMUSCULAR | Status: DC | PRN
  Administered 2014-11-05 (×2): 4 mg via INTRAVENOUS

## 2014-11-05 MED ORDER — PANTOPRAZOLE SODIUM 40 MG IV SOLR
40.0000 mg | Freq: Every day | INTRAVENOUS | Status: DC
  Administered 2014-11-05: 40 mg via INTRAVENOUS
  Filled 2014-11-05 (×2): qty 40

## 2014-11-05 MED ORDER — PROPOFOL 10 MG/ML IV BOLUS
INTRAVENOUS | Status: DC | PRN
  Administered 2014-11-05: 180 mg via INTRAVENOUS

## 2014-11-05 MED ORDER — IBUPROFEN 200 MG PO TABS: 800.0000 mg | ORAL_TABLET | Freq: Three times a day (TID) | ORAL | Status: DC | PRN

## 2014-11-05 MED ORDER — FENTANYL CITRATE (PF) 100 MCG/2ML IJ SOLN
INTRAMUSCULAR | Status: AC
  Filled 2014-11-05: qty 2

## 2014-11-05 MED ORDER — MIDAZOLAM HCL 2 MG/2ML IJ SOLN
INTRAMUSCULAR | Status: AC
  Filled 2014-11-05: qty 4

## 2014-11-05 MED ORDER — ZOLPIDEM TARTRATE 5 MG PO TABS: 5.0000 mg | ORAL_TABLET | Freq: Every evening | ORAL | Status: DC | PRN

## 2014-11-05 MED ORDER — BUPIVACAINE-EPINEPHRINE (PF) 0.25% -1:200000 IJ SOLN
INTRAMUSCULAR | Status: DC | PRN
  Administered 2014-11-05: 15 mL via PERINEURAL

## 2014-11-05 MED ORDER — TRAZODONE HCL 50 MG PO TABS
50.0000 mg | ORAL_TABLET | Freq: Every day | ORAL | Status: DC
  Administered 2014-11-05: 50 mg via ORAL
  Filled 2014-11-05: qty 1

## 2014-11-05 MED ORDER — ALBUTEROL SULFATE (2.5 MG/3ML) 0.083% IN NEBU: 3.0000 mL | INHALATION_SOLUTION | Freq: Four times a day (QID) | RESPIRATORY_TRACT | Status: DC | PRN

## 2014-11-05 MED ORDER — ACETAMINOPHEN 650 MG RE SUPP: 650.0000 mg | RECTAL | Status: DC | PRN

## 2014-11-05 MED ORDER — LACTATED RINGERS IV SOLN: INTRAVENOUS | Status: DC

## 2014-11-05 SURGICAL SUPPLY — 58 items
BUR MATCHSTICK NEURO 3.0 LAGG (BURR) ×3 IMPLANT
BUR ROUND FLUTED 4 SOFT TCH (BURR) IMPLANT
BUR ROUND FLUTED 4MM SOFT TCH (BURR)
CANISTER SUCTION 2500CC (MISCELLANEOUS) ×3 IMPLANT
CLOSURE WOUND 1/2 X4 (GAUZE/BANDAGES/DRESSINGS) ×1
CONT SPEC 4OZ CLIKSEAL STRL BL (MISCELLANEOUS) ×3 IMPLANT
COVER MAYO STAND STRL (DRAPES) ×3 IMPLANT
COVER SURGICAL LIGHT HANDLE (MISCELLANEOUS) ×3 IMPLANT
DERMABOND ADVANCED (GAUZE/BANDAGES/DRESSINGS) ×2
DERMABOND ADVANCED .7 DNX12 (GAUZE/BANDAGES/DRESSINGS) ×1 IMPLANT
DRAPE C-ARM 42X72 X-RAY (DRAPES) ×3 IMPLANT
DRAPE MICROSCOPE LEICA (MISCELLANEOUS) ×3 IMPLANT
DRAPE PROXIMA HALF (DRAPES) IMPLANT
DRAPE SURG 17X23 STRL (DRAPES) ×12 IMPLANT
DRSG MEPILEX BORDER 4X4 (GAUZE/BANDAGES/DRESSINGS) ×3 IMPLANT
DRSG MEPILEX BORDER 4X8 (GAUZE/BANDAGES/DRESSINGS) IMPLANT
DURAPREP 26ML APPLICATOR (WOUND CARE) ×3 IMPLANT
ELECT BLADE 4.0 EZ CLEAN MEGAD (MISCELLANEOUS) ×3
ELECT CAUTERY BLADE 6.4 (BLADE) ×3 IMPLANT
ELECT REM PT RETURN 9FT ADLT (ELECTROSURGICAL) ×3
ELECTRODE BLDE 4.0 EZ CLN MEGD (MISCELLANEOUS) ×1 IMPLANT
ELECTRODE REM PT RTRN 9FT ADLT (ELECTROSURGICAL) ×1 IMPLANT
GLOVE BIOGEL PI IND STRL 8 (GLOVE) ×1 IMPLANT
GLOVE BIOGEL PI INDICATOR 8 (GLOVE) ×2
GLOVE ECLIPSE 8.5 STRL (GLOVE) ×3 IMPLANT
GLOVE ORTHO TXT STRL SZ7.5 (GLOVE) ×3 IMPLANT
GLOVE SURG 8.5 LATEX PF (GLOVE) ×3 IMPLANT
GOWN STRL REUS W/ TWL LRG LVL3 (GOWN DISPOSABLE) ×2 IMPLANT
GOWN STRL REUS W/TWL 2XL LVL3 (GOWN DISPOSABLE) ×6 IMPLANT
GOWN STRL REUS W/TWL LRG LVL3 (GOWN DISPOSABLE) ×4
KIT BASIN OR (CUSTOM PROCEDURE TRAY) ×3 IMPLANT
KIT ROOM TURNOVER OR (KITS) ×3 IMPLANT
NEEDLE SPNL 18GX3.5 QUINCKE PK (NEEDLE) ×6 IMPLANT
NS IRRIG 1000ML POUR BTL (IV SOLUTION) ×3 IMPLANT
PACK LAMINECTOMY ORTHO (CUSTOM PROCEDURE TRAY) ×3 IMPLANT
PAD ARMBOARD 7.5X6 YLW CONV (MISCELLANEOUS) ×6 IMPLANT
PATTIES SURGICAL .5 X.5 (GAUZE/BANDAGES/DRESSINGS) IMPLANT
PATTIES SURGICAL .75X.75 (GAUZE/BANDAGES/DRESSINGS) IMPLANT
SPONGE LAP 4X18 X RAY DECT (DISPOSABLE) IMPLANT
SPONGE SURGIFOAM ABS GEL 100 (HEMOSTASIS) IMPLANT
STRIP CLOSURE SKIN 1/2X4 (GAUZE/BANDAGES/DRESSINGS) ×2 IMPLANT
SURGIFLO W/THROMBIN 8M KIT (HEMOSTASIS) ×3 IMPLANT
SUT VIC AB 0 CT1 27 (SUTURE) ×2
SUT VIC AB 0 CT1 27XBRD ANBCTR (SUTURE) ×1 IMPLANT
SUT VIC AB 1 CT1 27 (SUTURE) ×2
SUT VIC AB 1 CT1 27XBRD ANBCTR (SUTURE) ×1 IMPLANT
SUT VIC AB 1 CTX 36 (SUTURE) ×2
SUT VIC AB 1 CTX36XBRD ANBCTR (SUTURE) ×1 IMPLANT
SUT VIC AB 2-0 CT1 27 (SUTURE) ×4
SUT VIC AB 2-0 CT1 TAPERPNT 27 (SUTURE) ×2 IMPLANT
SUT VIC AB 3-0 X1 27 (SUTURE) ×3 IMPLANT
SUT VICRYL 0 UR6 27IN ABS (SUTURE) ×3 IMPLANT
SYR 20CC LL (SYRINGE) ×3 IMPLANT
SYR CONTROL 10ML LL (SYRINGE) ×3 IMPLANT
TOWEL OR 17X24 6PK STRL BLUE (TOWEL DISPOSABLE) ×3 IMPLANT
TOWEL OR 17X26 10 PK STRL BLUE (TOWEL DISPOSABLE) ×3 IMPLANT
TRAY FOLEY CATH 16FRSI W/METER (SET/KITS/TRAYS/PACK) ×3 IMPLANT
WATER STERILE IRR 1000ML POUR (IV SOLUTION) IMPLANT

## 2014-11-05 NOTE — Anesthesia Preprocedure Evaluation (Signed)
Anesthesia Evaluation  Patient identified by MRN, date of birth, ID band Patient awake    Reviewed: Allergy & Precautions, NPO status , Patient's Chart, lab work & pertinent test results  Airway Mallampati: II   Neck ROM: full    Dental   Pulmonary asthma , Current Smoker,  breath sounds clear to auscultation        Cardiovascular negative cardio ROS  Rhythm:regular Rate:Normal     Neuro/Psych  Headaches, Depression    GI/Hepatic   Endo/Other  Morbid obesity  Renal/GU      Musculoskeletal  (+) Arthritis -,   Abdominal   Peds  Hematology   Anesthesia Other Findings   Reproductive/Obstetrics                             Anesthesia Physical Anesthesia Plan  ASA: II  Anesthesia Plan: General   Post-op Pain Management:    Induction: Intravenous  Airway Management Planned: Oral ETT  Additional Equipment:   Intra-op Plan:   Post-operative Plan: Extubation in OR  Informed Consent: I have reviewed the patients History and Physical, chart, labs and discussed the procedure including the risks, benefits and alternatives for the proposed anesthesia with the patient or authorized representative who has indicated his/her understanding and acceptance.     Plan Discussed with: CRNA, Anesthesiologist and Surgeon  Anesthesia Plan Comments:         Anesthesia Quick Evaluation

## 2014-11-05 NOTE — Progress Notes (Signed)
Single level laminectomy with exparel and marcaine local. Uneventful resection of HNP. Should be ready for discharge 8/6 AM. Should have PT/OT prior to discharge, and may need walker and 3in1 commode order. Rx signed and on chart.

## 2014-11-05 NOTE — Brief Op Note (Signed)
11/05/2014  3:17 PM  PATIENT:  Yvonne [name]  29 y.o. female  PRE-OPERATIVE DIAGNOSIS:  L4-5 herniated nucleus pulposus  POST-OPERATIVE DIAGNOSIS:  L4-5 herniated nucleus pulposus  PROCEDURE:  Procedure(s): RIGHT L4-5 MICRODISCECTOMY  (N/A)  SURGEON:  Surgeon(s) and Role:    * Kerrin Champagne, MD - Primary  PHYSICIAN ASSISTANT: Andee Lineman  ANESTHESIA:   local and general  EBL:  Total I/O In: 1000 [I.V.:1000] Out: 275 [Urine:275]  BLOOD ADMINISTERED:none  DRAINS: Urinary Catheter (Foley)   LOCAL MEDICATIONS USED:  MARCAINE 0.5% 1:1 EXPAREL 1.3% Amount: 30 ml  SPECIMEN:  No Specimen  DISPOSITION OF SPECIMEN:  N/A  COUNTS:  YES  TOURNIQUET:  * No tourniquets in log *  DICTATION: .Dragon Dictation  PLAN OF CARE: Admit for overnight observation  PATIENT DISPOSITION:  PACU - hemodynamically stable.   Delay start of Pharmacological VTE agent (>24hrs) due to surgical blood loss or risk of bleeding: yes

## 2014-11-05 NOTE — Op Note (Signed)
11/05/2014  3:19 PM  PATIENT:  Yvonne [name]  29 y.o. female  MRN: [id number]  OPERATIVE REPORT  PRE-OPERATIVE DIAGNOSIS:  L4-5 herniated nucleus pulposus  POST-OPERATIVE DIAGNOSIS:  L4-5 herniated nucleus pulposus  PROCEDURE:  Procedure(s): RIGHT L4-5 MICRODISCECTOMY     SURGEON:  Jessy Oto, MD     ASSISTANT:  Benjiman Core, PA-C  (Present throughout the entire procedure and necessary for completion of procedure in a timely manner)     ANESTHESIA:  General,    COMPLICATIONS:  None.     COMPONENTS:   PROCEDURE:The patient was met in the holding area, and the appropriate right Lumbar level L4-5 identified and marked with "x" and my initials.The patient was then transported to OR and was placed under general anesthesia without difficulty. The patient received appropriate preoperative antibiotic prophylaxis. The patient after intubation atraumatically was transferred to the operating room table, prone position, Wilson frame, sliding OR table. All pressure points were well padded. The arms in 90-90 well-padded at the elbows. Standard prep with Iodoform solution lower dorsal spine to the mid sacral segment. Draped in the usual manner clear Vi-Drape was used. Time-out procedure was called and correct. 2x 18-gauge spinal needle was then inserted at the expected L4-5 level. C-arm was draped sterilely to the field and used to identify the spinal needles positions. The lower needle was at the lower aspect of the lamina of L4. Skin superior to this was then infiltrated with Marcaine 0.5% combined with exparel 1.3% 1:1 total of 20 cc used. An incision approximately an 2 inches in length was then made through skin and subcutaneous layers in line with the left side of the expected midline just superior to the spinal needle entry point. An incision made into the right lumbosacral fascia approximately 2 inches in length . Clamp placed on the L4 spinous process and intraoperative lateral radiograph  showed the clamp at the upper 1/2 of the L4 spinous process. This was then marked with an OR marking pen. The 80 mm blades for the insight retractor guided down to and docking on the posterior aspect of the lamina at the expected L4-5 level. This was sterilely attached to the articulating arm and it's up right which had been attached the OR table sterilely. An additional insight retractor placed in the superior-inferior direction with 68m blades. The operating room microscope sterilely draped brought into the field. Under the operating room microscope, the L4-5 interspace carefully debrided the small amount of muscle attachment here and high-speed bur used to drill the medial aspect of the inferior articular process of L4 approximately 20%. 2 mm Kerrison then used to enter the spinal canal over the superior aspect of the L5 lamina carefully using the Kerrison to debris the attachment as a curet. Foraminotomy was then performed over the left L5 nerve root. The medial 10% superior articular process of L5 and then resected using 2 mm Kerrison. This allowed for identification of the thecal sac. Penfield 4 was then used to carefully mobilize the thecal sac medially and the L5 nerve root identified within the lateral recess flattened over the posterior aspect of the protruded disc. Carefully the lateral aspect of the L5 nerve root was identified and a Penfield 4 was used to mobilize the nerve medially such that the protruded disc was visible with microscope. Using a Penfield 4 for retraction and a 15 blade scalpel was used to incise the posterior longitudinal ligament within the lateral recess on the left side longitudinally. Disc material  was removed using micropituitary rongeurs and right angle nerve root retractor then more easily able to be mobilized medially and retract the right L5 nerve root. Further foraminotomies was performed over the L5 nerve root the nerve root was noted to be X. without further compression.  The nerve root able to be retracted along the medial aspect of the L5 pedicle and disc material found to be subligamentous at this level was further resected current pituitary rongeurs.  Ligamentum flavum was further debrided superiorly to the level L4-5 disc. Had a moderate amount of further resection of the L4 lamina inferiorly and mediallywas performed. With this then the disc space at L4-5 was easily visualized and entry into the disc at the sided disc herniation was possible using a Penfield 4 intraoperative Lateral radiograph was used to identify the L4-5 disc with the Penfield 4 In place just below the disc space. Micropituitary was used to further debride this material superficially from the posterior aspect of the intervertebral disc is posterior lateral aspect of the disc. Small amount of further disc material was found subligamentous extending laterally from the disc this was removed using micropituitary rongeurs into the left L4 neuroforamen additionally epstein  currettes were used to decompress the left L4 neuroforamen. Upbiting currettes were used to further remove reflected portions of the reflected portions of the medial L4-5 facet and portions of the superior portion of the L5 superior articular  Facet. Ligamentum flavum was debrided and lateral recess along the medial aspect L4-5 facet no further decompression was necessary. Woodson nerve probe was then able to carefully palpate the neuroforamen for L4 and L5 finding these to be well decompressed. Bleeding was then controlled using thrombin-soaked Gelfoam small cottonoids and flow seal. Small amount of bleeding within the soft tissue mass the laminotomy area was controlled using bipolar electrocautery. Irrigation was carried out using copious amounts of irrigant solution. All Gelfoam were then removed. No significant active bleeding present at the time of removal. All instruments sponge counts were correct traction system was then carefully  removed carefully rotating retractors with this withdrawal and only bipolar electrocautery of any small bleeders. Lumbodorsal fascia was then carefully approximated with interrupted 0 Vicryl sutures, UR 6 needle deep subcutaneous layers were approximated with interrupted 0 Vicryl sutures on UR 6 the appear subcutaneous layers approximated with interrupted 2-0 Vicryl sutures and the skin closed with a running subcutaneous stitch of 4-0 Vicryl. Dermabond was applied allowed to dry and then Mepilex bandage applied. Patient was then carefully returned to supine position on a stretcher, reactivated and extubated. He was then returned to recovery room in satisfactory condition.   Benjiman Core PA-C perform the duties of assistant surgeon during this case. he was present from the beginning of the case to the end of the case assisting in transfer the patient from his stretcher to the OR table and back to the stretcher at the end of the case. Assisted in careful retraction and suction of the laminectomy site delicate neural structures operating under the operating room microscope. he performed closure of the incision from the fascia to the skin applying the dressing.     NITKA,JAMES E  11/05/2014, 3:19 PM

## 2014-11-05 NOTE — Transfer of Care (Signed)
Immediate Anesthesia Transfer of Care Note  Patient: Yvonne [name]  Procedure(s) Performed: Procedure(s): RIGHT L4-5 MICRODISCECTOMY  (N/A)  Patient Location: PACU  Anesthesia Type:General  Level of Consciousness: awake, alert , oriented and patient cooperative  Airway & Oxygen Therapy: Patient Spontanous Breathing and Patient connected to face mask oxygen  Post-op Assessment: Report given to RN, Post -op Vital signs reviewed and stable, Patient moving all extremities and Patient able to stick tongue midline  Post vital signs: Reviewed and stable  Last Vitals:  Filed Vitals:   11/05/14 1555  BP: 123/68  Pulse: 104  Temp: 36.6 C  Resp: 10    Complications: No apparent anesthesia complications
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
## 2014-11-05 NOTE — Anesthesia Postprocedure Evaluation (Signed)
Anesthesia Post Note  Patient: Yvonne [name]  Procedure(s) Performed: Procedure(s) (LRB): RIGHT L4-5 MICRODISCECTOMY  (N/A)  Anesthesia type: General  Patient location: PACU  Post pain: Pain level controlled and Adequate analgesia  Post assessment: Post-op Vital signs reviewed, Patient's Cardiovascular Status Stable, Respiratory Function Stable, Patent Airway and Pain level controlled  Last Vitals:  Filed Vitals:   11/05/14 1625  BP: 121/58  Pulse: 79  Temp:   Resp: 11    Post vital signs: Reviewed and stable  Level of consciousness: awake, alert  and oriented  Complications: No apparent anesthesia complications

## 2014-11-05 NOTE — Discharge Instructions (Addendum)
   No lifting greater than 10 lbs. Avoid bending, stooping and twisting. Walk in house for first week them may start to get out slowly increasing distance up to one half mile by 3 weeks post op. Keep incision dry for 3 days, may use tegaderm or similar water impervious dressing.  

## 2014-11-05 NOTE — Interval H&P Note (Signed)
History and Physical Interval Note:  11/05/2014 12:44 PM  Yvonne [name]  has presented today for surgery, with the diagnosis of L4-5 herniated nucleus pulposus  The various methods of treatment have been discussed with the patient and family. After consideration of risks, benefits and other options for treatment, the patient has consented to  Procedure(s): RIGHT L4-5 MICRODISCECTOMY  (N/A) as a surgical intervention .  The patient's history has been reviewed, patient examined, no change in status, stable for surgery.  I have reviewed the patient's chart and labs.  Questions were answered to the patient's satisfaction.     Koreen Lizaola E

## 2014-11-06 DIAGNOSIS — M5126 Other intervertebral disc displacement, lumbar region: Secondary | ICD-10-CM | POA: Diagnosis not present

## 2014-11-06 MED ORDER — PANTOPRAZOLE SODIUM 40 MG PO TBEC
40.0000 mg | DELAYED_RELEASE_TABLET | Freq: Every day | ORAL | Status: DC
  Administered 2014-11-06: 40 mg via ORAL
  Filled 2014-11-06: qty 1

## 2014-11-06 NOTE — Evaluation (Signed)
Physical Therapy Evaluation Patient Details Name: Yvonne [name] MRN: [id number] DOB: December 26, 1985 Today's Date: 11/06/2014   History of Present Illness  29 y.o. female with dx of HNP. She underwent microdiscectomy 11-05-14.  Clinical Impression  Pt to d/c home today. She lives in first floor, one-level apartment. Her family and neighbor will be able to assist PRN. RW needed for home.    Follow Up Recommendations No PT follow up;Supervision - Intermittent    Equipment Recommendations  Rolling walker with 5" wheels    Recommendations for Other Services       Precautions / Restrictions Precautions Precautions: Back Precaution Booklet Issued: Yes (comment) Precaution Comments: Pt educated on 3/3 back precautions. Handout provided.      Mobility  Bed Mobility Overal bed mobility: Modified Independent                Transfers Overall transfer level: Needs assistance Equipment used: Rolling walker (2 wheeled) Transfers: Sit to/from [employer] Sit to Stand: Supervision Stand pivot transfers: Supervision       General transfer comment: verbal cues for posture  Ambulation/Gait Ambulation/Gait assistance: Supervision Ambulation Distance (Feet): 150 Feet Assistive device: Rolling walker (2 wheeled) Gait Pattern/deviations: Step-through pattern;Decreased stride length;Trunk flexed   Gait velocity interpretation: Below normal speed for age/gender General Gait Details: verbal cues for posture  Stairs            Wheelchair Mobility    Modified Rankin (Stroke Patients Only)       Balance                                             Pertinent Vitals/Pain Pain Assessment: 0-10 Pain Score: 5  Pain Location: low back Pain Descriptors / Indicators: Sore Pain Intervention(s): Monitored during session;Repositioned    Home Living Family/patient expects to be discharged to:: Private residence Living Arrangements:  Children Available Help at Discharge: Family;Available PRN/intermittently Type of Home: Apartment Home Access: Level entry     Home Layout: One level Home Equipment: None      Prior Function Level of Independence: Independent               Hand Dominance        Extremity/Trunk Assessment                         Communication   Communication: No difficulties  Cognition Arousal/Alertness: Awake/alert Behavior During Therapy: WFL for tasks assessed/performed Overall Cognitive Status: Within Functional Limits for tasks assessed                      General Comments      Exercises        Assessment/Plan    PT Assessment Patent does not need any further PT services  PT Diagnosis Difficulty walking;Acute pain   PT Problem List    PT Treatment Interventions     PT Goals (Current goals can be found in the Care Plan section) Acute Rehab PT Goals Patient Stated Goal: independence PT Goal Formulation: All assessment and education complete, DC therapy    Frequency     Barriers to discharge        Co-evaluation               End of Session   Activity Tolerance: Patient tolerated treatment well Patient  left: in bed;with call bell/phone within reach Nurse Communication: Mobility status    Functional Assessment Tool Used: clinical judgement Functional Limitation: Mobility: Walking and moving around Mobility: Walking and Moving Around Current Status (Z6109): At least 1 percent but less than 20 percent impaired, limited or restricted Mobility: Walking and Moving Around Goal Status [phone]): At least 1 percent but less than 20 percent impaired, limited or restricted Mobility: Walking and Moving Around Discharge Status [phone]): At least 1 percent but less than 20 percent impaired, limited or restricted    Time: 1053-1109 PT Time Calculation (min) (ACUTE ONLY): 16 min   Charges:   PT Evaluation $Initial PT Evaluation Tier I: 1  Procedure     PT G Codes:   PT G-Codes **NOT FOR INPATIENT CLASS** Functional Assessment Tool Used: clinical judgement Functional Limitation: Mobility: Walking and moving around Mobility: Walking and Moving Around Current Status (B1478): At least 1 percent but less than 20 percent impaired, limited or restricted Mobility: Walking and Moving Around Goal Status [phone]): At least 1 percent but less than 20 percent impaired, limited or restricted Mobility: Walking and Moving Around Discharge Status [phone]): At least 1 percent but less than 20 percent impaired, limited or restricted    Ilda Foil 11/06/2014, 11:44 AM

## 2014-11-06 NOTE — Care Management Note (Signed)
Case Management Note  Patient Details  Name: CATHRYN GALLERY MRN: [id number] Date of Birth: November 07, 1985  Subjective/Objective:      29 yr old female s/p L4 microdiscectomy              Action/Plan:   DME has been ordered.   Expected Discharge Date:     11/06/14             Expected Discharge Plan:   home/ self care  In-House Referral:  NA  Discharge planning Services  CM Consult  Post Acute Care Choice:  Durable Medical Equipment Choice offered to:  NA  DME Arranged:  Walker rolling DME Agency:  [facility]  HH Arranged:  NA HH Agency:  NA  Status of Service:  Completed, signed off  Medicare Important Message Given:    Date Medicare IM Given:    Medicare IM give by:    Date Additional Medicare IM Given:    Additional Medicare Important Message give by:     If discussed at Long Length of Stay Meetings, dates discussed:    Additional Comments:  Durenda Guthrie, RN 11/06/2014, 12:03 PM

## 2014-11-08 ENCOUNTER — Encounter (HOSPITAL_COMMUNITY): Payer: Self-pay | Admitting: Specialist

## 2014-11-15 ENCOUNTER — Encounter (HOSPITAL_COMMUNITY): Payer: Self-pay

## 2014-11-15 ENCOUNTER — Emergency Department (HOSPITAL_COMMUNITY)
Admission: EM | Admit: 2014-11-15 | Discharge: 2014-11-15 | Discharge status: Against Medical Advice | Payer: Medicaid Other | Attending: Emergency Medicine | Admitting: Emergency Medicine

## 2014-11-15 DIAGNOSIS — J45909 Unspecified asthma, uncomplicated: Secondary | ICD-10-CM | POA: Diagnosis not present

## 2014-11-15 DIAGNOSIS — Z720 Tobacco use: Secondary | ICD-10-CM | POA: Insufficient documentation

## 2014-11-15 DIAGNOSIS — G8918 Other acute postprocedural pain: Secondary | ICD-10-CM | POA: Diagnosis not present

## 2014-11-15 DIAGNOSIS — M79604 Pain in right leg: Secondary | ICD-10-CM | POA: Insufficient documentation

## 2014-11-15 NOTE — ED Notes (Signed)
Pt had ruptured disc repair on Thursday.  Pt bent over 2 days ago and started having pain in rt leg.  Pain continues with ibuprofen. Denies numbness. Pain feels same as it did prior to surgery.  Local surgeon

## 2014-11-15 NOTE — ED Notes (Signed)
Pt spoke with registration.  Able to get appt this morning with her surgeon.  Leaving ER and heading to the office instead

## 2014-11-24 NOTE — Discharge Summary (Signed)
Physician Discharge Summary      Patient ID: Yvonne [name] MRN: [id number] DOB/AGE: 1985/08/10 29 y.o.  Admit date: 11/05/2014 Discharge date: 11/06/2014  Admission Diagnoses:  Principal Problem:   HNP (herniated nucleus pulposus), lumbar   Discharge Diagnoses:  Same  Past Medical History  Diagnosis Date  . Asthma     albuterol inhaler  . Blood transfusion     2008 with c/s 4 units transfused  . Headache(784.0)     otc ibuprofen 800mg   . 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 Depression   . Chronic back pain     Surgeries: Procedure(s): RIGHT L4-5 MICRODISCECTOMY  on 11/05/2014   Consultants:    Discharged Condition: Improved  Hospital Course: LAZARIAH [name] is an 29 y.o. female who was admitted 11/05/2014 with a chief complaint of No chief complaint on file. , and found to have a diagnosis of HNP (herniated nucleus pulposus), lumbar. She was brought to the operating room on 11/05/2014 and underwent the above named procedures.    She was given perioperative antibiotics:  Anti-infectives    Start     Dose/Rate Route Frequency Ordered Stop   11/05/14 2100  ceFAZolin (ANCEF) IVPB 2 g/50 mL premix     2 g 100 mL/hr over 30 Minutes Intravenous Every 8 hours 11/05/14 1756 11/06/14 0559   11/05/14 1007  ceFAZolin (ANCEF) 2-3 GM-% IVPB SOLR    Comments:  Alferd Apa   : cabinet override      11/05/14 1007 11/05/14 1329    Post surgery she was awake and alert. POD#1 voiding without difficulty, taking and tolerating po medications without difficulty. Participated in PT and OT and was moving slowly for a person of her age but was demonstrating adequate activity level to allow for discharge home, to a first floor, single level apartment, with assistance of her neighbor. She was discharged home on POD # 1.  She was given sequential compression devices and early ambulationfor DVT prophylaxis.  She benefited maximally from their hospital  stay and there were no complications.    Recent vital signs:  Filed Vitals:   11/06/14 0625  BP: 117/58  Pulse: 82  Temp: 97.6 F (36.4 C)  Resp: 18    Recent laboratory studies:  Results for orders placed or performed during the hospital encounter of 11/03/14  Surgical pcr screen  Result Value Ref Range   MRSA, PCR NEGATIVE NEGATIVE   Staphylococcus aureus NEGATIVE NEGATIVE  CBC  Result Value Ref Range   WBC 11.2 (H) 4.0 - 10.5 K/uL   RBC 4.25 3.87 - 5.11 MIL/uL   Hemoglobin 11.9 (L) 12.0 - 15.0 g/dL   HCT 40.9 81.1 - 91.4 %   MCV 85.6 78.0 - 100.0 fL   MCH 28.0 26.0 - 34.0 pg   MCHC 32.7 30.0 - 36.0 g/dL   RDW 78.2 95.6 - 21.3 %   Platelets 310 150 - 400 K/uL  Comprehensive metabolic panel  Result Value Ref Range   Sodium 138 135 - 145 mmol/L   Potassium 3.7 3.5 - 5.1 mmol/L   Chloride 103 101 - 111 mmol/L   CO2 26 22 - 32 mmol/L   Glucose, Bld 105 (H) 65 - 99 mg/dL   BUN 5 (L) 6 - 20 mg/dL   Creatinine, Ser 0.86 0.44 - 1.00 mg/dL   Calcium 9.2 8.9 - 57.8 mg/dL   Total Protein 7.6 6.5 - 8.1  g/dL   Albumin 3.9 3.5 - 5.0 g/dL   AST 29 15 - 41 U/L   ALT 30 14 - 54 U/L   Alkaline Phosphatase 121 38 - 126 U/L   Total Bilirubin 0.4 0.3 - 1.2 mg/dL   GFR calc non Af Amer >60 >60 mL/min   GFR calc Af Amer >60 >60 mL/min   Anion gap 9 5 - 15    Discharge Medications:     Medication List    STOP taking these medications        cephALEXin 500 MG capsule  Commonly known as:  KEFLEX     metroNIDAZOLE 500 MG tablet  Commonly known as:  FLAGYL      TAKE these medications        albuterol 108 (90 BASE) MCG/ACT inhaler  Commonly known as:  PROVENTIL HFA;VENTOLIN HFA  Inhale 2 puffs into the lungs every 6 (six) hours as needed for wheezing or shortness of breath.     ibuprofen 800 MG tablet  Commonly known as:  ADVIL,MOTRIN  Take 1 tablet (800 mg total) by mouth every 8 (eight) hours as needed.     ondansetron 4 MG disintegrating tablet  Commonly known as:   ZOFRAN ODT  Take 1 tablet (4 mg total) by mouth every 8 (eight) hours as needed for nausea or vomiting.     oxyCODONE-acetaminophen 5-325 MG per tablet  Commonly known as:  PERCOCET  Take 1-2 tablets by mouth every 4 (four) hours as needed for severe pain.     QUEtiapine 300 MG 24 hr tablet  Commonly known as:  SEROQUEL XR  Take 2 tablets (600 mg total) by mouth at bedtime.     QUEtiapine 300 MG tablet  Commonly known as:  SEROQUEL  Take 300 mg by mouth at bedtime.     traZODone 50 MG tablet  Commonly known as:  DESYREL  Take 50 mg by mouth at bedtime.        Diagnostic Studies: Dg Lumbar Spine 2-3 Views  11/05/2014   CLINICAL DATA:  Patient for L4-5 microdiskectomy.  EXAM: LUMBAR SPINE - 2-3 VIEW  COMPARISON:  Lumbar spine MRI 06/13/2014  FINDINGS: Two intraoperative images of the lumbar spine were submitted. The first image demonstrates hardware posteriorly at the L3-4 and L4-5 interspace. The second image demonstrates hardware posteriorly to the L4-5 interspace.  IMPRESSION: Intraoperative images of the lumbar spine as above.   Electronically Signed   By: Annia Belt M.D.   On: 11/05/2014 15:31    Disposition: 07-Left Against Medical Advice      Discharge Instructions    Call MD / Call 911    Complete by:  As directed   If you experience chest pain or shortness of breath, CALL 911 and be transported to the hospital emergency room.  If you develope a fever above 101 F, pus (white drainage) or increased drainage or redness at the wound, or calf pain, call your surgeon's office.     Constipation Prevention    Complete by:  As directed   Drink plenty of fluids.  Prune juice may be helpful.  You may use a stool softener, such as Colace (over the counter) 100 mg twice a day.  Use MiraLax (over the counter) for constipation as needed.     Diet - low sodium heart healthy    Complete by:  As directed      Discharge instructions    Complete by:  As directed  No lifting greater than  10 lbs. Avoid bending, stooping and twisting. Walk in house for first week them may start to get out slowly increasing distance up to one half mile by 3 weeks post op. Keep incision dry for 3 days, may use tegaderm or similar water impervious dressing.     Driving restrictions    Complete by:  As directed   No driving for 3 weeks     Increase activity slowly as tolerated    Complete by:  As directed      Lifting restrictions    Complete by:  As directed   No lifting for 8 weeks           Follow-up Information    Follow up with NITKA,JAMES E, MD In 2 weeks.   Specialty:  Orthopedic Surgery   Why:  For wound re-check   Contact information:   [address] [place] [address] [phone]        Signed: Kerrin Champagne 11/24/2014, 5:29 PM

## 2014-12-07 ENCOUNTER — Ambulatory Visit (HOSPITAL_BASED_OUTPATIENT_CLINIC_OR_DEPARTMENT_OTHER): Payer: Medicaid Other | Attending: *Deleted | Admitting: Radiology

## 2014-12-07 VITALS — Ht 60.0 in | Wt 218.0 lb

## 2014-12-07 DIAGNOSIS — R0683 Snoring: Secondary | ICD-10-CM | POA: Diagnosis not present

## 2014-12-07 DIAGNOSIS — R5383 Other fatigue: Secondary | ICD-10-CM | POA: Insufficient documentation

## 2014-12-07 DIAGNOSIS — I493 Ventricular premature depolarization: Secondary | ICD-10-CM | POA: Insufficient documentation

## 2014-12-07 DIAGNOSIS — G4733 Obstructive sleep apnea (adult) (pediatric): Secondary | ICD-10-CM | POA: Insufficient documentation

## 2014-12-11 ENCOUNTER — Encounter (HOSPITAL_BASED_OUTPATIENT_CLINIC_OR_DEPARTMENT_OTHER): Payer: Medicaid Other | Admitting: Internal Medicine

## 2014-12-11 DIAGNOSIS — R0683 Snoring: Secondary | ICD-10-CM

## 2014-12-11 NOTE — Progress Notes (Signed)
  Patient Name: Yvonne [name], Yvonne [name] Date: 12/07/2014 Gender: Female D.O.B: 1986-04-02 Age (years): 28 Referring Provider: Yevonne Pax Height (inches): 60 Interpreting Physician: Jetty Duhamel MD, ABSM Weight (lbs): 218 RPSGT: Armen Pickup BMI: 43 MRN: [id number] Neck Size: 16.50 CLINICAL INFORMATION Sleep Study Type: NPSG  Indication for sleep study: Fatigue, OSA, Snoring  Epworth Sleepiness Score: 0  SLEEP STUDY TECHNIQUE As per the AASM Manual for the Scoring of Sleep and Associated Events v2.3 (April 2016) with a hypopnea requiring 4% desaturations.  The channels recorded and monitored were frontal, central and occipital EEG, electrooculogram (EOG), submentalis EMG (chin), nasal and oral airflow, thoracic and abdominal wall motion, anterior tibialis EMG, snore microphone, electrocardiogram, and pulse oximetry.  MEDICATIONS Patient's medications include: charted for review. Medications self-administered by patient during sleep study : ibuprofen, percocet, quetiapine  SLEEP ARCHITECTURE The study was initiated at 10:03:36 PM and ended at 5:04:46 AM.  Sleep onset time was 55.8 minutes and the sleep efficiency was 85.5%. The total sleep time was 359.9 minutes.  Stage REM latency was 280.5 minutes.  The patient spent 1.11% of the night in stage N1 sleep, 88.05% in stage N2 sleep, 0.28% in stage N3 and 10.56% in REM.  Alpha intrusion was absent.  Supine sleep was 55.93%.  Awake after sleep onset 5.5 minutes  RESPIRATORY PARAMETERS The overall apnea/hypopnea index (AHI) was 0.2 per hour. There were 0 total apneas, including 0 obstructive, 0 central and 0 mixed apneas. There were 1 hypopneas and 14 RERAs.  The AHI during Stage REM sleep was 1.6 per hour.  AHI while supine was 0.0 per hour.  The mean oxygen saturation was 97.54%. The minimum SpO2 during sleep was 92.00%.  Moderate snoring was noted during this study.  CARDIAC DATA The 2 lead EKG demonstrated  sinus rhythm. The mean heart rate was 83.54 beats per minute. Other EKG findings include: PVCs.  LEG MOVEMENT DATA The total PLMS were 38 with a resulting PLMS index of 6.33. Associated arousal with leg movement index was 0.3 .  IMPRESSIONS No significant obstructive sleep apnea occurred during this study (AHI = 0.2/h). No significant central sleep apnea occurred during this study (CAI = 0.0/h). The patient had minimal or no oxygen desaturation during the study (Min O2 = 92.00%) The patient snored with Moderate snoring volume. EKG findings include PVCs. Mild periodic limb movements of sleep occurred during the study. No significant associated arousals.   DIAGNOSIS Primary Snoring (786.09 [R06.83 ICD-10]) Normal study  RECOMMENDATIONS Avoid alcohol, sedatives and other CNS depressants that may worsen sleep apnea and disrupt normal sleep architecture. Sleep hygiene should be reviewed to assess factors that may improve sleep quality. Weight management and regular exercise should be initiated or continued if appropriate.  Waymon Budge Diplomate, American Board of Sleep Medicine  ELECTRONICALLY SIGNED ON:  12/11/2014, 10:32 AM [facility] PH: [phone]   FX: [phone] ACCREDITED BY THE AMERICAN ACADEMY OF SLEEP MEDICINE

## 2015-02-10 ENCOUNTER — Ambulatory Visit (HOSPITAL_BASED_OUTPATIENT_CLINIC_OR_DEPARTMENT_OTHER): Payer: Medicaid Other

## 2015-04-03 ENCOUNTER — Emergency Department (HOSPITAL_COMMUNITY)
Admission: EM | Admit: 2015-04-03 | Discharge: 2015-04-03 | Disposition: A | Discharge status: Home / Self Care | Payer: Medicaid Other | Attending: Emergency Medicine | Admitting: Emergency Medicine

## 2015-04-03 ENCOUNTER — Encounter (HOSPITAL_COMMUNITY): Payer: Self-pay | Admitting: Nurse Practitioner

## 2015-04-03 DIAGNOSIS — F329 Major depressive disorder, single episode, unspecified: Secondary | ICD-10-CM | POA: Insufficient documentation

## 2015-04-03 DIAGNOSIS — Z90710 Acquired absence of both cervix and uterus: Secondary | ICD-10-CM | POA: Insufficient documentation

## 2015-04-03 DIAGNOSIS — Z79899 Other long term (current) drug therapy: Secondary | ICD-10-CM | POA: Insufficient documentation

## 2015-04-03 DIAGNOSIS — N390 Urinary tract infection, site not specified: Secondary | ICD-10-CM | POA: Diagnosis not present

## 2015-04-03 DIAGNOSIS — M1990 Unspecified osteoarthritis, unspecified site: Secondary | ICD-10-CM | POA: Diagnosis not present

## 2015-04-03 DIAGNOSIS — F17210 Nicotine dependence, cigarettes, uncomplicated: Secondary | ICD-10-CM | POA: Diagnosis not present

## 2015-04-03 DIAGNOSIS — G8929 Other chronic pain: Secondary | ICD-10-CM | POA: Insufficient documentation

## 2015-04-03 DIAGNOSIS — J45909 Unspecified asthma, uncomplicated: Secondary | ICD-10-CM | POA: Insufficient documentation

## 2015-04-03 DIAGNOSIS — R319 Hematuria, unspecified: Secondary | ICD-10-CM

## 2015-04-03 DIAGNOSIS — N939 Abnormal uterine and vaginal bleeding, unspecified: Secondary | ICD-10-CM | POA: Diagnosis present

## 2015-04-03 LAB — URINALYSIS, ROUTINE W REFLEX MICROSCOPIC
BILIRUBIN URINE: NEGATIVE
Glucose, UA: NEGATIVE mg/dL
KETONES UR: NEGATIVE mg/dL
NITRITE: NEGATIVE
PROTEIN: 30 mg/dL — AB
Specific Gravity, Urine: 1.019 (ref 1.005–1.030)
pH: 6 (ref 5.0–8.0)

## 2015-04-03 LAB — URINE MICROSCOPIC-ADD ON

## 2015-04-03 MED ORDER — SULFAMETHOXAZOLE-TRIMETHOPRIM 800-160 MG PO TABS: 1.0000 | ORAL_TABLET | Freq: Two times a day (BID) | ORAL | Status: AC

## 2015-04-03 NOTE — ED Provider Notes (Signed)
CSN: [id number]     Arrival date & time 04/03/15  1431 History  By signing my name below, I, Yvonne [name], attest that this documentation has been prepared under the direction and in the presence of Yvonne BuffaloHope Jamileth Putzier, NP.  Electronically Signed: [name], Scribe. 04/03/2015. 4:26 PM.    Chief Complaint  Patient presents with  . Vaginal Bleeding    Patient is a 30 y.o. female presenting with vaginal bleeding. The history is provided by the patient. No language interpreter was used.  Vaginal Bleeding Quality:  Unable to specify Severity:  Mild Onset quality:  Sudden Duration:  1 day Timing:  Constant Progression:  Unchanged Chronicity:  New Menstrual history: amenorrhea since hysterectomy 7 years ago. Number of pads used:  0 Number of tampons used:  0 Possible pregnancy: no   Context: spontaneously   Context: not after intercourse   Context comment:  With wiping Relieved by:  Nothing Worsened by:  Nothing tried Ineffective treatments:  None tried Associated symptoms: no dysuria, no fever and no vaginal discharge   Risk factors: does not have multiple partners, no new sexual partner, no STD and no STD exposure     HPI Comments: Annye EnglishStephanie C [name] is a 30 y.o. female with a hx of hysterectomy due to heavy bleeding seven years ago who presents to the Emergency Department complaining of mild vaginal bleeding after wiping with mild associated cramping onset today. She notes that her LNMP was eight years ago after giving birth to her now 89eight year old daughter. Pt states that she was last sexually active three days ago with her partner of seven years, and also reports that she was tested for STD's last week with all tests coming back negative. Pt denies fever, chills, itching, hematuria, vaginal pain, abnormal vaginal discharge or vaginal bleeding following intercourse. She also denies currently taking any hormones. Only spotting on tissue after voiding.   Past Medical History   Diagnosis Date  . Asthma     albuterol inhaler  . Blood transfusion     2008 with c/s 4 units transfused  . Headache(784.0)     otc ibuprofen 800mg   . 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 Depression   . Chronic back pain    Past Surgical History  Procedure Laterality Date  . Appendectomy    . Cesarean section    . Cholecystectomy    . Laparoscopy    . Hand surgery    . Tonsillectomy      T & A  . Tubal ligation    . Endometrial ablation    . Laparoscopic hysterectomy Bilateral 09/29/2012    Procedure: HYSTERECTOMY TOTAL LAPAROSCOPIC;  Surgeon: Purcell NailsAngela Y Roberts, MD;  Location: [facility];  Service: Gynecology;  Laterality: Bilateral;  Total Laparoscopic Hysterectomy, Bilateral Salpingectomies, Cystoscopy poss. LAVH poss. TAH  . Laparoscopic assisted vaginal hysterectomy N/A 09/29/2012    Procedure: LAPAROSCOPIC ASSISTED VAGINAL HYSTERECTOMY;  Surgeon: Purcell NailsAngela Y Roberts, MD;  Location: [facility];  Service: Gynecology;  Laterality: N/A;  . Cystoscopy Bilateral 09/29/2012    Procedure: CYSTOSCOPY;  Surgeon: Purcell NailsAngela Y Roberts, MD;  Location: [facility];  Service: Gynecology;  Laterality: Bilateral;  . Bilateral salpingectomy Bilateral 09/29/2012    Procedure: BILATERAL SALPINGECTOMY;  Surgeon: Purcell NailsAngela Y Roberts, MD;  Location: [facility];  Service: Gynecology;  Laterality: Bilateral;  . Examination under anesthesia N/A 10/16/2012    Procedure: EXAM UNDER ANESTHESIA;  Surgeon:  Purcell Nails, MD;  Location: [facility];  Service: Gynecology;  Laterality: N/A;  Suturing Vaginal Cuff  . Abdominal hysterectomy N/A 09/29/2012    Procedure: HYSTERECTOMY ABDOMINAL;  Surgeon: Purcell Nails, MD;  Location: [facility];  Service: Gynecology;  Laterality: N/A;  . Lumbar laminectomy N/A 11/05/2014    Procedure: RIGHT L4-5 MICRODISCECTOMY ;  Surgeon: Kerrin Champagne, MD;  Location: [facility];  Service: Orthopedics;  Laterality: N/A;   Family History  Problem Relation Age of Onset  . Diabetes  Mother   . Diabetes Maternal Grandmother    Social History  Substance Use Topics  . Smoking status: Current Every Day Smoker -- 0.50 packs/day    Types: Cigarettes  . Smokeless tobacco: Never Used  . Alcohol Use: 0.6 oz/week    1 Glasses of wine per week     Comment: ocassionally   OB History    Gravida Para Term Preterm AB TAB SAB Ectopic Multiple Living   4 4 4       1      Review of Systems  Constitutional: Negative for fever and chills.  Genitourinary: Positive for vaginal bleeding. Negative for dysuria, hematuria, vaginal discharge and vaginal pain.  All other systems reviewed and are negative.   Allergies  Bee venom; Reglan; and Aspirin  Home Medications   Prior to Admission medications   Medication Sig Start Date End Date Taking? Authorizing Provider  ibuprofen (ADVIL,MOTRIN) 800 MG tablet Take 1 tablet (800 mg total) by mouth every 8 (eight) hours as needed. 12/24/13  Yes Christopher Lawyer, PA-C  QUEtiapine (SEROQUEL XR) 300 MG 24 hr tablet Take 2 tablets (600 mg total) by mouth at bedtime. 09/09/14  Yes [name], PA-C  sulfamethoxazole-trimethoprim (BACTRIM DS,SEPTRA DS) 800-160 MG tablet Take 1 tablet by mouth 2 (two) times daily. 04/03/15 04/10/15  Kyaire Gruenewald Orlene Och, NP   BP 123/74 mmHg  Pulse 69  Temp(Src) 98.3 F (36.8 C) (Oral)  Resp 16  Ht 5' (1.524 m)  Wt 99.882 kg  BMI 43.00 kg/m2  SpO2 100%  LMP 07/25/2012 Physical Exam  Constitutional: She is oriented to person, place, and time. She appears well-developed and well-nourished. No distress.  HENT:  Head: Normocephalic and atraumatic.  Eyes: Conjunctivae are normal.  Neck: Normal range of motion.  Cardiovascular: Normal rate.   Pulmonary/Chest: Effort normal.  Abdominal: Soft. She exhibits no distension. There is no tenderness.  Genitourinary:  Chaperone (scribe) was present for exam which was performed with no discomfort or complications. External genitalia without lesions white d/c vaginal vault.  Cervix absent, uterus absent. No blood noted. No mass palpated, no tender on exam.   Neurological: She is alert and oriented to person, place, and time.  Skin: Skin is warm and dry.  Psychiatric: She has a normal mood and affect. Her behavior is normal.  Nursing note and vitals reviewed.   ED Course  Procedures  DIAGNOSTIC STUDIES:    Oxygen Saturation is 98% on RA, normal by my interpretation.   COORDINATION OF CARE:  4:16 PM Will perform pelvic exam. Will order labs. Discussed treatment plan with pt at bedside and pt agreed to plan.   Labs Review Results for orders placed or performed during the hospital encounter of 04/03/15 (from the past 24 hour(s))  Urinalysis, Routine w reflex microscopic (not at [facility])     Status: Abnormal   Collection Time: 04/03/15  5:22 PM  Result Value Ref Range   Color, Urine YELLOW YELLOW   APPearance CLOUDY (  A) CLEAR   Specific Gravity, Urine 1.019 1.005 - 1.030   pH 6.0 5.0 - 8.0   Glucose, UA NEGATIVE NEGATIVE mg/dL   Hgb urine dipstick LARGE (A) NEGATIVE   Bilirubin Urine NEGATIVE NEGATIVE   Ketones, ur NEGATIVE NEGATIVE mg/dL   Protein, ur 30 (A) NEGATIVE mg/dL   Nitrite NEGATIVE NEGATIVE   Leukocytes, UA MODERATE (A) NEGATIVE  Urine microscopic-add on     Status: Abnormal   Collection Time: 04/03/15  5:22 PM  Result Value Ref Range   Squamous Epithelial / LPF 0-5 (A) NONE SEEN   WBC, UA 6-30 0 - 5 WBC/hpf   RBC / HPF TOO NUMEROUS TO COUNT 0 - 5 RBC/hpf   Bacteria, UA RARE (A) NONE SEEN    Imaging Review No results found. I have personally reviewed and evaluated these lab results as part of my medical decision-making.   MDM  30 y.o. female with concern for possible vaginal bleeding after seeing blood on the tissue after wiping. Stable for d/c without vaginal bleeding. She does have hematuria and apparent UTI. Will treat with Bactrim and urine sent for culture. Discussed with the patient and all questioned fully answered. She will  follow up with her PCP or return here if any problems arise.   Final diagnoses:  UTI (lower urinary tract infection)  Hematuria   I personally performed the services described in this documentation, which was scribed in my presence. The recorded information has been reviewed and is accurate.     [address] [place], [place] 04/04/15 1610  Azalia Bilis, MD 04/06/15 [phone]

## 2015-04-03 NOTE — Discharge Instructions (Signed)
You have blood in your urine. This may be due to an urinary tract infection, however, you will need to follow up with your doctor after you have finished the course of antibiotics to be sure the blood has cleared. Return here as needed.

## 2015-04-03 NOTE — ED Notes (Addendum)
She urinated today and saw blood on toilet tissue after wiping, which she feels is vaginal bleeding. She reports no vaginal bleeding since tubal/hysterectomy surgery 8 years ago.  She reports mild pelvic cramps. She denies back/flank pain, fevers, n/v.

## 2015-04-05 LAB — URINE CULTURE

## 2015-04-17 ENCOUNTER — Emergency Department (HOSPITAL_COMMUNITY)
Admission: EM | Admit: 2015-04-17 | Discharge: 2015-04-17 | Disposition: A | Discharge status: Home / Self Care | Payer: Medicaid Other | Attending: Emergency Medicine | Admitting: Emergency Medicine

## 2015-04-17 ENCOUNTER — Emergency Department (HOSPITAL_COMMUNITY): Payer: Medicaid Other

## 2015-04-17 ENCOUNTER — Encounter (HOSPITAL_COMMUNITY): Payer: Self-pay | Admitting: *Deleted

## 2015-04-17 DIAGNOSIS — M1990 Unspecified osteoarthritis, unspecified site: Secondary | ICD-10-CM | POA: Diagnosis not present

## 2015-04-17 DIAGNOSIS — Z79899 Other long term (current) drug therapy: Secondary | ICD-10-CM | POA: Diagnosis not present

## 2015-04-17 DIAGNOSIS — F329 Major depressive disorder, single episode, unspecified: Secondary | ICD-10-CM | POA: Insufficient documentation

## 2015-04-17 DIAGNOSIS — G8929 Other chronic pain: Secondary | ICD-10-CM | POA: Diagnosis not present

## 2015-04-17 DIAGNOSIS — S3992XA Unspecified injury of lower back, initial encounter: Secondary | ICD-10-CM | POA: Diagnosis present

## 2015-04-17 DIAGNOSIS — Y9289 Other specified places as the place of occurrence of the external cause: Secondary | ICD-10-CM | POA: Diagnosis not present

## 2015-04-17 DIAGNOSIS — Y9389 Activity, other specified: Secondary | ICD-10-CM | POA: Insufficient documentation

## 2015-04-17 DIAGNOSIS — F17210 Nicotine dependence, cigarettes, uncomplicated: Secondary | ICD-10-CM | POA: Diagnosis not present

## 2015-04-17 DIAGNOSIS — J45909 Unspecified asthma, uncomplicated: Secondary | ICD-10-CM | POA: Diagnosis not present

## 2015-04-17 DIAGNOSIS — W108XXA Fall (on) (from) other stairs and steps, initial encounter: Secondary | ICD-10-CM | POA: Insufficient documentation

## 2015-04-17 DIAGNOSIS — Y998 Other external cause status: Secondary | ICD-10-CM | POA: Diagnosis not present

## 2015-04-17 MED ORDER — CYCLOBENZAPRINE HCL 10 MG PO TABS: 10.0000 mg | ORAL_TABLET | Freq: Two times a day (BID) | ORAL | Status: DC | PRN

## 2015-04-17 MED ORDER — IBUPROFEN 800 MG PO TABS: 800.0000 mg | ORAL_TABLET | Freq: Three times a day (TID) | ORAL | Status: DC

## 2015-04-17 NOTE — ED Provider Notes (Signed)
CSN: [id number]     Arrival date & time 04/17/15  1343 History  By signing my name below, I, Phillis HaggisGabriella Gaje, attest that this documentation has been prepared under the direction and in the presence of General MillsBenjamin Shimshon Narula, PA-C. Electronically Signed: Phillis HaggisGabriella Gaje, ED Scribe. 04/17/2015. 3:41 PM.   Chief Complaint  Patient presents with  . Fall   The history is provided by the patient. No language interpreter was used.  HPI Comments: Yvonne [name] is a 30 y.o. Female with a hx of arthritis and chronic back pain who presents to the Emergency Department complaining of a fall onset earlier this morning. Pt states that she was at the top of the stairs, her knees "buckled" and she fell down 6 uncarpeted stairs, landing on her left side. Pt reports associated burning left sided pain, most notably in the rib area. Pt states that she has had problems following back surgery, but states that her doctor will not get a second opinion or any more pain medication. She has not tried anything for her symptoms. She denies hx of bone disorders, hitting head, LOC, numbness, or weakness. No other modifying factors.   Past Medical History  Diagnosis Date  . Asthma     albuterol inhaler  . Blood transfusion     2008 with c/s 4 units transfused  . Headache(784.0)     otc ibuprofen 800mg   . 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 Depression   . Chronic back pain    Past Surgical History  Procedure Laterality Date  . Appendectomy    . Cesarean section    . Cholecystectomy    . Laparoscopy    . Hand surgery    . Tonsillectomy      T & A  . Tubal ligation    . Endometrial ablation    . Laparoscopic hysterectomy Bilateral 09/29/2012    Procedure: HYSTERECTOMY TOTAL LAPAROSCOPIC;  Surgeon: Purcell NailsAngela Y Roberts, MD;  [facility]: [facility];  Service: Gynecology;  Laterality: Bilateral;  Total Laparoscopic Hysterectomy, Bilateral Salpingectomies, Cystoscopy poss. LAVH poss. TAH  .  Laparoscopic assisted vaginal hysterectomy N/A 09/29/2012    Procedure: LAPAROSCOPIC ASSISTED VAGINAL HYSTERECTOMY;  Surgeon: Purcell NailsAngela Y Roberts, MD;  [facility];  Service: Gynecology;  Laterality: N/A;  . Cystoscopy Bilateral 09/29/2012    Procedure: CYSTOSCOPY;  Surgeon: Purcell NailsAngela Y Roberts, MD;  [facility];  Service: Gynecology;  Laterality: Bilateral;  . Bilateral salpingectomy Bilateral 09/29/2012    Procedure: BILATERAL SALPINGECTOMY;  Surgeon: Purcell NailsAngela Y Roberts, MD;  [facility];  Service: Gynecology;  Laterality: Bilateral;  . Examination under anesthesia N/A 10/16/2012    Procedure: EXAM UNDER ANESTHESIA;  Surgeon: Purcell NailsAngela Y Roberts, MD;  [facility];  Service: Gynecology;  Laterality: N/A;  Suturing Vaginal Cuff  . Abdominal hysterectomy N/A 09/29/2012    Procedure: HYSTERECTOMY ABDOMINAL;  Surgeon: Purcell NailsAngela Y Roberts, MD;  [facility];  Service: Gynecology;  Laterality: N/A;  . Lumbar laminectomy N/A 11/05/2014    Procedure: RIGHT L4-5 MICRODISCECTOMY ;  Surgeon: Kerrin ChampagneJames E Nitka, MD;  Location[facility];  Service: Orthopedics;  Laterality: N/A;   Family History  Problem Relation Age of Onset  . Diabetes Mother   . Diabetes Maternal Grandmother    Social History  Substance Use Topics  . Smoking status: Current Every Day Smoker -- 0.50 packs/day    Types: Cigarettes  . Smokeless tobacco: Never Used  . Alcohol  Use: 0.6 oz/week    1 Glasses of wine per week     Comment: ocassionally   OB History    Gravida Para Term Preterm AB TAB SAB Ectopic Multiple Living   4 4 4       1      Review of Systems 10 Systems reviewed and all are negative for acute change except as noted in the HPI.  Allergies  Bee venom; Reglan; and Aspirin  Home Medications   Prior to Admission medications   Medication Sig Start Date End Date Taking? Authorizing Provider  cyclobenzaprine (FLEXERIL) 10 MG tablet Take 1 tablet (10 mg total) by mouth 2 (two) times daily as needed for muscle spasms.  04/17/15   [name], PA-C  ibuprofen (ADVIL,MOTRIN) 800 MG tablet Take 1 tablet (800 mg total) by mouth 3 (three) times daily. 04/17/15   [name], PA-C  QUEtiapine (SEROQUEL XR) 300 MG 24 hr tablet Take 2 tablets (600 mg total) by mouth at bedtime. 09/09/14   [name], PA-C   BP 109/63 mmHg  Pulse 78  Temp(Src) 98.1 F (36.7 C) (Oral)  Resp 18  Ht 5' (1.524 m)  Wt 99.791 kg  BMI 42.97 kg/m2  SpO2 100%  LMP 07/25/2012 Physical Exam  Constitutional: She is oriented to person, place, and time. She appears well-developed and well-nourished.  HENT:  Head: Normocephalic and atraumatic.  Eyes: EOM are normal.  Neck: Normal range of motion. Neck supple.  Cardiovascular: Normal rate and regular rhythm.   Pulmonary/Chest: Effort normal and breath sounds normal.  Lungs clear to auscultation bilaterally  Musculoskeletal: Normal range of motion.  Left shoulder" able to ABduct to 90 degrees, but beyond is painful. Diffuse tenderness throughout left ribs and flank with no obvious lesions or deformities.   Neurological: She is alert and oriented to person, place, and time.  Skin: Skin is warm and dry.  Psychiatric: She has a normal mood and affect. Her behavior is normal.  Nursing note and vitals reviewed.   ED Course  Procedures (including critical care time) DIAGNOSTIC STUDIES: Oxygen Saturation is 100% on RA, normal by my interpretation.    COORDINATION OF CARE: 2:13 PM-Discussed treatment plan which includes x-ray with pt at bedside and pt agreed to plan.   Labs Review Labs Reviewed - No data to display  Imaging Review Dg Ribs Unilateral W/chest Left  04/17/2015  CLINICAL DATA:  Left shoulder and chest pain after falling down steps today. Initial encounter. EXAM: LEFT RIBS AND CHEST - 3+ VIEW COMPARISON:  Chest radiographs 12/03/2013 and 11/11/2013. FINDINGS: There are low lung volumes on the frontal examination with resulting atelectasis at both lung bases. There  is no confluent airspace opacity, pleural effusion or pneumothorax. The heart size and mediastinal contours are normal. No evidence of acute rib fracture or focal rib lesion. IMPRESSION: No evidence of rib fracture, pleural effusion or pneumothorax. Suboptimal inspiration on frontal examination with resulting bibasilar atelectasis. Electronically Signed   By: Carey Bullocks M.D.   On: 04/17/2015 15:37   Dg Shoulder Left  04/17/2015  CLINICAL DATA:  Left shoulder pain after falling down steps today. Initial encounter. EXAM: LEFT SHOULDER - 2+ VIEW COMPARISON:  Left shoulder radiograph 09/26/2010. FINDINGS: The mineralization and alignment are normal. There is no evidence of acute fracture or dislocation. The joint spaces are maintained. The subacromial space is preserved. IMPRESSION: No acute osseous findings. Electronically Signed   By: Carey Bullocks M.D.   On: 04/17/2015 15:35   I  have personally reviewed and evaluated these images and lab results as part of my medical decision-making.   EKG Interpretation None     Meds given in ED:  Medications - No data to display  Discharge Medication List as of 04/17/2015  3:44 PM    START taking these medications   Details  cyclobenzaprine (FLEXERIL) 10 MG tablet Take 1 tablet (10 mg total) by mouth 2 (two) times daily as needed for muscle spasms., Starting 04/17/2015, Until Discontinued, Print       Filed Vitals:   04/17/15 1350  BP: 109/63  Pulse: 78  Temp: 98.1 F (36.7 C)  TempSrc: Oral  Resp: 18  Height: 5' (1.524 m)  Weight: 99.791 kg  SpO2: 100%    MDM  Patient comes in for evaluation of left-sided pain after falling down 6 steps. No head trauma or LOC. Physical exam is without any focal neurodeficits. Benign cardiopulmonary exam. Patient has been walking throughout the emergency department without difficulty. Patient X-Ray negative for obvious fracture or dislocation. Pain managed in ED. Discharged with prescription for Motrin and  Flexeril, medication precautions given. Pt advised to follow up with orthopedics if symptoms persist for possibility of missed fracture diagnosis. Patient given brace while in ED, conservative therapy recommended and discussed. Patient will be dc home & is agreeable with above plan.  Final diagnoses:  Fall (on) (from) other stairs and steps, initial encounter   I personally performed the services described in this documentation, which was scribed in my presence. The recorded information has been reviewed and is accurate.    [name], PA-C 04/17/15 1600  Bethann Berkshire, MD 04/19/15 [phone]

## 2015-04-17 NOTE — Discharge Instructions (Signed)
There does not appear to be an emergent cause for her symptoms at this time. Your x-rays were negative for any broken bones, dislocations or other acute abnormalities. Ear pain is likely musculoskeletal in nature and will take time to get better. You may take ibuprofen for mild to moderate pain. You may also use your Flexeril for muscle spasms. Do not take this medication before driving as it can make you very drowsy. Follow-up with your doctor as needed. Return to ED for any new or worsening symptoms.

## 2015-04-17 NOTE — ED Notes (Signed)
Pt reports she fell down 6 inside stairs this AM . Pt now has pain to Lt side of back. Pt denies any LOC but does report she hit the LT side of her head.

## 2015-04-17 NOTE — ED Notes (Signed)
Pt placed in hospital gown for x ray

## 2015-04-17 NOTE — ED Notes (Signed)
Declined W/C at D/C and was escorted to lobby by RN. 

## 2015-05-31 ENCOUNTER — Other Ambulatory Visit: Payer: Self-pay | Admitting: Specialist

## 2015-05-31 DIAGNOSIS — M545 Low back pain: Secondary | ICD-10-CM

## 2015-06-10 ENCOUNTER — Inpatient Hospital Stay: Admission: RE | Admit: 2015-06-10 | Payer: Medicaid Other | Source: Ambulatory Visit

## 2015-06-22 ENCOUNTER — Ambulatory Visit
Admission: RE | Admit: 2015-06-22 | Discharge: 2015-06-22 | Disposition: A | Discharge status: Home / Self Care | Payer: Medicaid Other | Source: Ambulatory Visit | Attending: Specialist | Admitting: Specialist

## 2015-06-22 DIAGNOSIS — M545 Low back pain: Secondary | ICD-10-CM

## 2015-06-25 ENCOUNTER — Emergency Department (HOSPITAL_COMMUNITY)
Admission: EM | Admit: 2015-06-25 | Discharge: 2015-06-25 | Disposition: A | Discharge status: Home / Self Care | Payer: Medicaid Other | Attending: Emergency Medicine | Admitting: Emergency Medicine

## 2015-06-25 ENCOUNTER — Encounter (HOSPITAL_COMMUNITY): Payer: Self-pay | Admitting: *Deleted

## 2015-06-25 DIAGNOSIS — M47816 Spondylosis without myelopathy or radiculopathy, lumbar region: Secondary | ICD-10-CM | POA: Diagnosis not present

## 2015-06-25 DIAGNOSIS — K002 Abnormalities of size and form of teeth: Secondary | ICD-10-CM | POA: Insufficient documentation

## 2015-06-25 DIAGNOSIS — K0889 Other specified disorders of teeth and supporting structures: Secondary | ICD-10-CM

## 2015-06-25 DIAGNOSIS — J45909 Unspecified asthma, uncomplicated: Secondary | ICD-10-CM | POA: Diagnosis not present

## 2015-06-25 DIAGNOSIS — F329 Major depressive disorder, single episode, unspecified: Secondary | ICD-10-CM | POA: Insufficient documentation

## 2015-06-25 DIAGNOSIS — Z791 Long term (current) use of non-steroidal anti-inflammatories (NSAID): Secondary | ICD-10-CM | POA: Diagnosis not present

## 2015-06-25 DIAGNOSIS — R61 Generalized hyperhidrosis: Secondary | ICD-10-CM | POA: Insufficient documentation

## 2015-06-25 DIAGNOSIS — Z79899 Other long term (current) drug therapy: Secondary | ICD-10-CM | POA: Diagnosis not present

## 2015-06-25 DIAGNOSIS — G8929 Other chronic pain: Secondary | ICD-10-CM | POA: Diagnosis not present

## 2015-06-25 MED ORDER — PENICILLIN V POTASSIUM 500 MG PO TABS: 500.0000 mg | ORAL_TABLET | Freq: Four times a day (QID) | ORAL | Status: DC

## 2015-06-25 MED ORDER — IBUPROFEN 400 MG PO TABS
800.0000 mg | ORAL_TABLET | Freq: Once | ORAL | Status: AC
  Administered 2015-06-25: 800 mg via ORAL
  Filled 2015-06-25: qty 2

## 2015-06-25 MED ORDER — BUPIVACAINE-EPINEPHRINE (PF) 0.5% -1:200000 IJ SOLN
1.8000 mL | Freq: Once | INTRAMUSCULAR | Status: AC
  Administered 2015-06-25: 1.8 mL
  Filled 2015-06-25: qty 1.8

## 2015-06-25 MED ORDER — IBUPROFEN 600 MG PO TABS: 600.0000 mg | ORAL_TABLET | Freq: Four times a day (QID) | ORAL | Status: DC | PRN

## 2015-06-25 NOTE — ED Notes (Signed)
Declined W/C at D/C and was escorted to lobby by RN. 

## 2015-06-25 NOTE — ED Notes (Signed)
PT reports dental pain on Lt lower side.

## 2015-06-25 NOTE — Discharge Instructions (Signed)
[name],  Nice meeting you! Please follow-up with your dentist. Return to the emergency department if you develop difficulty swallowing, fevers, chills, increased pain, drooling. Feel better soon!  S. Lane HackerNicole Dru Primeau, PA-C   [employer] [facility] The United Ways 211 is a great source of information about community services available.  Access by dialing 2-1-1 from anywhere in [place], or by website -  PooledIncome.plwww.nc211.org.   Other Local Resources (Updated 04/2015)  Dental  Care   Services    Phone Number and Address  Cost  [place] [facility] For children 320 - 31 years of age:   Cleaning  Tooth brushing/flossing instruction  Sealants, fillings, crowns  Extractions  Emergency treatment  [phone] [address] [place], [place] [address] Charges based on family income.  Medicaid and some insurance plans accepted.     [facility]  Cleaning  Sealants, fillings, crowns  Extractions  Emergency treatment [phone] [address] [place], [place]  Pregnant women 31 years of age or older with a Medicaid card  [facility] [place]  Cleaning  Sealants, fillings, crowns  Extractions  Emergency treatment [phone] [address] [place], [place] Pregnant women 31 years of age or older with a Medicaid card  [facility] For children 660 - 31 years of age:   Cleaning  Tooth brushing/flossing instruction  Sealants, fillings, crowns  Extractions  Emergency treatment Limited orthodontic services for patients with Medicaid [phone] [address] [place], [place] [address] Medicaid and [facility] Health Choice cover for children up to age 31 and pregnant women.  Parents of children up to age 31 without Medicaid pay a reduced fee at time of service.  [facility]  [facility] [place] For children 50 - 31 years of age:   Cleaning  Tooth brushing/flossing instruction  Sealants, fillings, crowns  Extractions  Emergency treatment Limited orthodontic services for patients with Medicaid [phone] [address] [place], [place].  Medicaid and [place] Health Choice cover for children up to age 31 and pregnant women.  Parents of children up to age 31 without Medicaid pay a reduced fee.  [facility]  Cleaning  Sealants, fillings, crowns  Extractions  Hours: Tuesdays and Thursdays, 4:15 - 8 pm [phone] [address], [address] [place], [place] [address] Services free of charge to [facility] residents ages 18-64 who do not have health insurance, Medicare, [place], or [place] benefits and fall within federal poverty guidelines  [facility]    Provides dental care in addition to primary medical care, nutritional counseling, and pharmacy:  Nurse, mental healthCleaning  Sealants, fillings, crowns  Extractions                  [phone] [facility], [address] [place], [place]  [phone] [facility], [address] [place], [place]  [phone] [facility] [place], [place]  [phone] [facility], [address] [place], [place]  [phone] [facility] [address] [place], [place] Accepts [place], [place]are, most insurance.  Also provides services available to all with fees adjusted based on ability to pay.    [facility]  Cleaning  Tooth brushing/flossing instruction  Sealants, fillings, crowns  Extractions  Emergency treatment Hours: Tuesdays,  Thursdays, and Fridays from 8 am to 5 pm by appointment only. [phone] [address] [place], [place] [address] [facility] residents with Medicaid (depending on  eligibility) and children with [facility] Health Choice - call for more information.  [facility]  Extractions only  Hours: 2nd and 4th Thursday of each month from 6:30 am - 9 am.   [phone] ext. [phone] [place], [place] [address] Ages 42 and older only.  Patients are seen on a first come, first served basis.  [employer]  [employer]  Extractions  Orthodontics  Endodontics  Implants/Crowns/Bridges  Complete and partial dentures [phone] [place], [name] Patients must complete an application for services.  There is often a waiting list.

## 2015-06-25 NOTE — ED Provider Notes (Signed)
CSN: [id number]     Arrival date & time 06/25/15  1129 History  By signing my name below, I, Placido Sou, attest that this documentation has been prepared under the direction and in the presence of [name] PA-C. Electronically Signed: Placido Sou, ED Scribe. 06/25/2015. 11:52 AM.   Chief Complaint  Patient presents with  . Dental Pain   The history is provided by the patient. No language interpreter was used.    HPI Comments: Yvonne [name] is a 30 y.o. female who presents to the Emergency Department complaining of worsening, moderate, left lower dental pain onset 3 days. She reports associated, intermittent, nocturnal hyperhidrosis due to pain. Pt notes applying Orajel without significant relief of her pain. Her pain worsens with palpation or when chewing. Pt notes attempting to reach out to her dental provider unsuccessully prior to coming to the ED. She denies fevers, chills or any other associated symptoms at this time.   Past Medical History  Diagnosis Date  . Asthma     albuterol inhaler  . Blood transfusion     2008 with c/s 4 units transfused  . Headache(784.0)     otc ibuprofen   . 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 Depression   . Chronic back pain    Past Surgical History  Procedure Laterality Date  . Appendectomy    . Cesarean section    . Cholecystectomy    . Laparoscopy    . Hand surgery    . Tonsillectomy      T & A  . Tubal ligation    . Endometrial ablation    . Laparoscopic hysterectomy Bilateral 09/29/2012    Procedure: HYSTERECTOMY TOTAL LAPAROSCOPIC;  Surgeon: Purcell Nails, MD;  [facility];  Service: Gynecology;  Laterality: Bilateral;  Total Laparoscopic Hysterectomy, Bilateral Salpingectomies, Cystoscopy poss. LAVH poss. TAH  . Laparoscopic assisted vaginal hysterectomy N/A 09/29/2012    Procedure: LAPAROSCOPIC ASSISTED VAGINAL HYSTERECTOMY;  Surgeon: Purcell Nails, MD;  [facility];  Service: Gynecology;  Laterality: N/A;  . Cystoscopy Bilateral 09/29/2012    Procedure: CYSTOSCOPY;  Surgeon: Purcell Nails, MD;  [facility];  Service: Gynecology;  Laterality: Bilateral;  . Bilateral salpingectomy Bilateral 09/29/2012    Procedure: BILATERAL SALPINGECTOMY;  Surgeon: Purcell Nails, MD;  [facility];  Service: Gynecology;  Laterality: Bilateral;  . Examination under anesthesia N/A 10/16/2012    Procedure: EXAM UNDER ANESTHESIA;  Surgeon: Purcell Nails, MD;  [facility];  Service: Gynecology;  Laterality: N/A;  Suturing Vaginal Cuff  . Abdominal hysterectomy N/A 09/29/2012    Procedure: HYSTERECTOMY ABDOMINAL;  Surgeon: Purcell Nails, MD;  [facility];  Service: Gynecology;  Laterality: N/A;  . Lumbar laminectomy N/A 11/05/2014    Procedure: RIGHT L4-5 MICRODISCECTOMY ;  Surgeon: Kerrin Champagne, MD;  [facility];  Service: Orthopedics;  Laterality: N/A;   Family History  Problem Relation Age of Onset  . Diabetes Mother   . Diabetes Maternal Grandmother    Social History  Substance Use Topics  . Smoking status: Current Every Day Smoker -- 0.50 packs/day    Types: Cigarettes  . Smokeless tobacco: Never Used  . Alcohol Use: 0.6 oz/week    1 Glasses of wine per week     Comment: ocassionally   OB History    Gravida Para Term Preterm AB TAB SAB Ectopic Multiple Living  4 4 4       1      Review of Systems A complete 10 system review of systems was obtained and all systems are negative except as noted in the HPI and PMH.   Allergies  Bee venom; Reglan; and Aspirin  Home Medications   Prior to Admission medications   Medication Sig Start Date End Date Taking? Authorizing Provider  cyclobenzaprine (FLEXERIL) 10 MG tablet Take 1 tablet (10 mg total) by mouth 2 (two) times daily as needed for muscle spasms. 04/17/15   Joycie PeekBenjamin Cartner, PA-C  ibuprofen (ADVIL,MOTRIN) 800 MG tablet Take 1 tablet (800 mg total) by mouth 3 (three) times  daily. 04/17/15   Joycie PeekBenjamin Cartner, PA-C  QUEtiapine (SEROQUEL XR) 300 MG 24 hr tablet Take 2 tablets (600 mg total) by mouth at bedtime. 09/09/14   Marlon Peliffany Greene, PA-C   LMP 07/25/2012    Physical Exam  Constitutional: She is oriented to person, place, and time. She appears well-developed and well-nourished.  HENT:  Head: Normocephalic and atraumatic.  Poor dentition. No erythema or drainage, uvular swelling/deviation.   Eyes: EOM are normal.  Neck: Normal range of motion.  Cardiovascular: Normal rate.   Pulmonary/Chest: Effort normal. No respiratory distress.  Abdominal: Soft.  Musculoskeletal: Normal range of motion.  Neurological: She is alert and oriented to person, place, and time.  Skin: Skin is warm and dry.  Psychiatric: She has a normal mood and affect.  Nursing note and vitals reviewed.   ED Course  .Nerve Block Date/Time: 06/28/2015 8:29 AM Performed by: Althea GrimmerILEY, Verta Riedlinger NICOLE Authorized by: Melton KrebsILEY, Vahe Pienta NICOLE Consent: Verbal consent obtained. Risks and benefits: risks, benefits and alternatives were discussed Consent given by: patient Patient understanding: patient states understanding of the procedure being performed Patient identity confirmed: verbally with patient and arm band Indications: pain relief Body area: face/mouth Nerve: inferior alveolar Laterality: left Patient sedated: no Patient position: sitting Needle gauge: 27 G Local anesthetic: bupivacaine 0.5% with epinephrine Outcome: pain improved Patient tolerance: Patient tolerated the procedure well with no immediate complications    DIAGNOSTIC STUDIES: Oxygen Saturation is 100% on RA, normal by my interpretation.    COORDINATION OF CARE: 11:50 AM Discussed next steps with pt. She verbalized understanding and is agreeable with the plan.   MDM   Final diagnoses:  Pain, dental    Patient with dentalgia.  No abscess requiring immediate incision and drainage.  Exam not concerning for  Ludwig's angina or pharyngeal abscess.  Will treat with dental block, PCN, ibuprofen. Pt instructed to follow-up with dentist.  Discussed return precautions. Pt safe for discharge.  I personally performed the services described in this documentation, which was scribed in my presence. The recorded information has been reviewed and is accurate.    Melton KrebsSamantha Nicole Johnay Mano, PA-C 06/28/15 [id number]  Rolan BuccoMelanie Belfi, MD 07/02/15 [phone]

## 2015-07-29 ENCOUNTER — Encounter (HOSPITAL_COMMUNITY): Payer: Self-pay

## 2015-07-29 ENCOUNTER — Emergency Department (HOSPITAL_COMMUNITY)
Admission: EM | Admit: 2015-07-29 | Discharge: 2015-07-29 | Disposition: A | Discharge status: Home / Self Care | Payer: Medicaid Other | Attending: Emergency Medicine | Admitting: Emergency Medicine

## 2015-07-29 DIAGNOSIS — M479 Spondylosis, unspecified: Secondary | ICD-10-CM | POA: Insufficient documentation

## 2015-07-29 DIAGNOSIS — F329 Major depressive disorder, single episode, unspecified: Secondary | ICD-10-CM | POA: Diagnosis not present

## 2015-07-29 DIAGNOSIS — B372 Candidiasis of skin and nail: Secondary | ICD-10-CM | POA: Diagnosis not present

## 2015-07-29 DIAGNOSIS — F17210 Nicotine dependence, cigarettes, uncomplicated: Secondary | ICD-10-CM | POA: Insufficient documentation

## 2015-07-29 DIAGNOSIS — Z792 Long term (current) use of antibiotics: Secondary | ICD-10-CM | POA: Insufficient documentation

## 2015-07-29 DIAGNOSIS — J45909 Unspecified asthma, uncomplicated: Secondary | ICD-10-CM | POA: Insufficient documentation

## 2015-07-29 DIAGNOSIS — R197 Diarrhea, unspecified: Secondary | ICD-10-CM | POA: Insufficient documentation

## 2015-07-29 DIAGNOSIS — G8929 Other chronic pain: Secondary | ICD-10-CM | POA: Insufficient documentation

## 2015-07-29 DIAGNOSIS — Z79899 Other long term (current) drug therapy: Secondary | ICD-10-CM | POA: Insufficient documentation

## 2015-07-29 DIAGNOSIS — L988 Other specified disorders of the skin and subcutaneous tissue: Secondary | ICD-10-CM | POA: Diagnosis present

## 2015-07-29 MED ORDER — CLOTRIMAZOLE 1 % EX CREA: TOPICAL_CREAM | CUTANEOUS | Status: DC

## 2015-07-29 NOTE — Discharge Instructions (Signed)
Keep skin clean and dry, changing undergarments if they get wet with sweat or any other moisture. Use cream as directed until the area resolves. Follow up with your regular doctor in 1 week for recheck of symptoms. Return to the ER for changes or worsening symptoms.   Cutaneous Candidiasis Cutaneous candidiasis is a condition in which there is an overgrowth of yeast (candida) on the skin. Yeast normally live on the skin, but in small enough numbers not to cause any symptoms. In certain cases, increased growth of the yeast may cause an actual yeast infection. This kind of infection usually occurs in areas of the skin that are constantly warm and moist, such as the armpits or the groin. Yeast is the most common cause of diaper rash in babies and in people who cannot control their bowel movements (incontinence). CAUSES  The fungus that most often causes cutaneous candidiasis is Candida albicans. Conditions that can increase the risk of getting a yeast infection of the skin include:  Obesity.  Pregnancy.  Diabetes.  Taking antibiotic medicine.  Taking birth control pills.  Taking steroid medicines.  Thyroid disease.  An iron or zinc deficiency.  Problems with the immune system. SYMPTOMS   Red, swollen area of the skin.  Bumps on the skin.  Itchiness. DIAGNOSIS  The diagnosis of cutaneous candidiasis is usually based on its appearance. Light scrapings of the skin may also be taken and viewed under a microscope to identify the presence of yeast. TREATMENT  Antifungal creams may be applied to the infected skin. In severe cases, oral medicines may be needed.  HOME CARE INSTRUCTIONS   Keep your skin clean and dry.  Maintain a healthy weight.  If you have diabetes, keep your blood sugar under control. SEEK IMMEDIATE MEDICAL CARE IF:  Your rash continues to spread despite treatment.  You have a fever, chills, or abdominal pain.   This information is not intended to replace  advice given to you by your health care provider. Make sure you discuss any questions you have with your health care provider.   Document Released: 12/05/2010 Document Revised: 06/11/2011 Document Reviewed: 09/20/2014 Elsevier Interactive Patient Education [employer]2016 Elsevier Inc.

## 2015-07-29 NOTE — ED Notes (Signed)
Pt here with c/o skin tear to right upper mid-clavicular line that she has had since she was 30 years old but states the "irritation" is worse and painful. No bleeding.

## 2015-07-29 NOTE — ED Provider Notes (Signed)
CSN: [id number]     Arrival date & time 07/29/15  1620 History  By signing my name below, I, Yvonne [name], attest that this documentation has been prepared under the direction and in the presence of [address]Yvonne [name], [employer] CorporationPA-C. Electronically Signed: Tanda RockersMargaux [name], ED Scribe. 07/29/2015. 5:06 PM.   Chief Complaint  Patient presents with  . Skin Problem   The history is provided by the patient. No language interpreter was used.     HPI Comments: Yvonne EnglishStephanie C [name] is a 30 y.o. female who presents to the Emergency Department complaining of skin tear to right lateral chest x [redacted] weeks along the bra line. She notes gradual onset, intermittent, 4/10, dull aching/throbbing, non-radiating pain to the area. The pain is only present with palpation of the area. She also notes mild drainage earlier in the week but none now. Pt has applied cortisone cream without relief. Pt does admit to wearing 2 sports bras everyday and works outside [place]. She has also been having increased sweating recently. No new exposure to animals, plants, or changes in soaps or lotions. No recent contact with similar symptoms. Pt has had diarrhea from recent antibiotic use, which is improving. Denies red streaking, warmth, induration, fever, chills, chest pain, shortness of breath, abdominal pain, nausea, vomiting, constipation, difficulty urinating, dysuria, hematuria, weakness, numbness, tingling, or any other associated symptoms. No recent sick contact with similar symptoms.   Past Medical History  Diagnosis Date  . Asthma     albuterol inhaler  . Blood transfusion     2008 with c/s 4 units transfused  . Headache(784.0)     otc ibuprofen 800mg   . 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 Depression   . Chronic back pain    Past Surgical History  Procedure Laterality Date  . Appendectomy    . Cesarean section    . Cholecystectomy    . Laparoscopy    . Hand surgery    .  Tonsillectomy      T & A  . Tubal ligation    . Endometrial ablation    . Laparoscopic hysterectomy Bilateral 09/29/2012    Procedure: HYSTERECTOMY TOTAL LAPAROSCOPIC;  Surgeon: Purcell NailsAngela Y Roberts, MD;  Location: [facility];  Service: Gynecology;  Laterality: Bilateral;  Total Laparoscopic Hysterectomy, Bilateral Salpingectomies, Cystoscopy poss. LAVH poss. TAH  . Laparoscopic assisted vaginal hysterectomy N/A 09/29/2012    Procedure: LAPAROSCOPIC ASSISTED VAGINAL HYSTERECTOMY;  Surgeon: Purcell NailsAngela Y Roberts, MD;  Location: [facility];  Service: Gynecology;  Laterality: N/A;  . Cystoscopy Bilateral 09/29/2012    Procedure: CYSTOSCOPY;  Surgeon: Purcell NailsAngela Y Roberts, MD;  Location: [facility];  Service: Gynecology;  Laterality: Bilateral;  . Bilateral salpingectomy Bilateral 09/29/2012    Procedure: BILATERAL SALPINGECTOMY;  Surgeon: Purcell NailsAngela Y Roberts, MD;  Location: [facility];  Service: Gynecology;  Laterality: Bilateral;  . Examination under anesthesia N/A 10/16/2012    Procedure: EXAM UNDER ANESTHESIA;  Surgeon: Purcell NailsAngela Y Roberts, MD;  Location: [facility];  Service: Gynecology;  Laterality: N/A;  Suturing Vaginal Cuff  . Abdominal hysterectomy N/A 09/29/2012    Procedure: HYSTERECTOMY ABDOMINAL;  Surgeon: Purcell NailsAngela Y Roberts, MD;  Location: [facility];  Service: Gynecology;  Laterality: N/A;  . Lumbar laminectomy N/A 11/05/2014    Procedure: RIGHT L4-5 MICRODISCECTOMY ;  Surgeon: Kerrin ChampagneJames E Nitka, MD;  Location: [facility];  Service: Orthopedics;  Laterality: N/A;   Family History  Problem Relation Age  of Onset  . Diabetes Mother   . Diabetes Maternal Grandmother    Social History  Substance Use Topics  . Smoking status: Current Every Day Smoker -- 0.50 packs/day    Types: Cigarettes  . Smokeless tobacco: Never Used  . Alcohol Use: 0.6 oz/week    1 Glasses of wine per week     Comment: ocassionally   OB History    Gravida Para Term Preterm AB TAB SAB Ectopic Multiple Living   Review of Systems  Constitutional:  Negative for fever and chills.  Respiratory: Negative for shortness of breath.   Cardiovascular: Negative for chest pain.  Gastrointestinal: Positive for diarrhea (from antibiotics). Negative for nausea, vomiting, abdominal pain and constipation.  Genitourinary: Negative for dysuria, hematuria and difficulty urinating.  Musculoskeletal: Negative for myalgias and arthralgias.  Skin: Negative for color change.       + Skin tear without drainage Negative for warmth or induration  Allergic/Immunologic: Negative for immunocompromised state.  Neurological: Negative for weakness and numbness.  Psychiatric/Behavioral: Negative for confusion.  A complete 10 system review of systems was obtained and all systems are negative except as noted in the HPI and PMH.     Allergies  Bee venom; Reglan; and Aspirin  Home Medications   Prior to Admission medications   Medication Sig Start Date End Date Taking? Authorizing Provider  cyclobenzaprine (FLEXERIL) 10 MG tablet Take 1 tablet (10 mg total) by mouth 2 (two) times daily as needed for muscle spasms. 04/17/15   [name], PA-C  ibuprofen (ADVIL,MOTRIN) 600 MG tablet Take 1 tablet (600 mg total) by mouth every 6 (six) hours as needed. 06/25/15   [name], PA-C  penicillin v potassium (VEETID) 500 MG tablet Take 1 tablet (500 mg total) by mouth 4 (four) times daily. 06/25/15   [name], PA-C  QUEtiapine (SEROQUEL XR) 300 MG 24 hr tablet Take 2 tablets (600 mg total) by mouth at bedtime. 09/09/14   [name], PA-C   BP 126/75 mmHg  Pulse 80  Temp(Src) 98.8 F (37.1 C) (Oral)  Resp 16  SpO2 100%  LMP 07/25/2012   Physical Exam  Constitutional: She is oriented to person, place, and time. Vital signs are normal. She appears well-developed and well-nourished.  Non-toxic appearance. No distress.  Afebrile, nontoxic, NAD  HENT:  Head: Normocephalic and atraumatic.  Mouth/Throat: Mucous membranes are normal.  Eyes:  Conjunctivae and EOM are normal. Right eye exhibits no discharge. Left eye exhibits no discharge.  Neck: Normal range of motion. Neck supple.  Cardiovascular: Normal rate.   Pulmonary/Chest: Effort normal. No respiratory distress.  Abdominal: Normal appearance. She exhibits no distension.  Musculoskeletal: Normal range of motion.  Neurological: She is alert and oriented to person, place, and time. She has normal strength. No sensory deficit.  Skin: Skin is warm and dry. Lesion noted. No rash noted.  Hyperpigmented skin of the bra line with a small macerated area on the right lateral chest wall in the intertriginous region. No erythema or warmth, no drainage, no fluctuance or induration, mildly TTP. SEE PICTURE BELOW.   Psychiatric: She has a normal mood and affect. Her behavior is normal.  Nursing note and vitals reviewed.     ED Course  Procedures (including critical care time)  DIAGNOSTIC STUDIES: Oxygen Saturation is 100% on RA, normal by my interpretation.    COORDINATION OF CARE: 5:03 PM-Discussed treatment plan with  pt at bedside and pt agreed to plan.   Labs Review Labs Reviewed - No data to display  Imaging Review No results found.    EKG Interpretation None      MDM   Final diagnoses:  Candidal intertrigo    30 y.o. female here with macerated skin to bra line on R torso, consistent with intertriginous candidiasis. Due to location, will use clotrimazole cream instead of nystatin just for ease of application. Discussed keeping skin dry. F/up with PCP in 1wk. I explained the diagnosis and have given explicit precautions to return to the ER including for any other new or worsening symptoms. The patient understands and accepts the medical plan as it's been dictated and I have answered their questions. Discharge instructions concerning home care and prescriptions have been given. The patient is STABLE and is discharged to home in good condition.   I personally  performed the services described in this documentation, which was scribed in my presence. The recorded information has been reviewed and is accurate.  BP 126/75 mmHg  Pulse 80  Temp(Src) 98.8 F (37.1 C) (Oral)  Resp 16  SpO2 100%  LMP 07/25/2012  Meds ordered this encounter  Medications  . clotrimazole (LOTRIMIN) 1 % cream    Sig: Apply to affected area 2 times daily until symptoms have resolved    Dispense:  15 g    Refill:  0    Order Specific Question:  Supervising Provider    Answer:  Eber Hong [[id number]]        Cecia Egge Camprubi-Soms, PA-C 07/29/15 1709  Blane Ohara, MD 07/30/15 [phone]

## 2015-09-13 ENCOUNTER — Encounter (HOSPITAL_COMMUNITY): Payer: Self-pay | Admitting: Emergency Medicine

## 2015-09-13 ENCOUNTER — Emergency Department (HOSPITAL_COMMUNITY): Payer: Medicaid Other

## 2015-09-13 DIAGNOSIS — R0789 Other chest pain: Secondary | ICD-10-CM

## 2015-09-13 DIAGNOSIS — Z5321 Procedure and treatment not carried out due to patient leaving prior to being seen by health care provider: Secondary | ICD-10-CM | POA: Insufficient documentation

## 2015-09-13 DIAGNOSIS — R51 Headache: Secondary | ICD-10-CM | POA: Insufficient documentation

## 2015-09-13 DIAGNOSIS — G43909 Migraine, unspecified, not intractable, without status migrainosus: Secondary | ICD-10-CM | POA: Diagnosis not present

## 2015-09-13 DIAGNOSIS — M4686 Other specified inflammatory spondylopathies, lumbar region: Secondary | ICD-10-CM | POA: Diagnosis not present

## 2015-09-13 DIAGNOSIS — J45909 Unspecified asthma, uncomplicated: Secondary | ICD-10-CM | POA: Diagnosis not present

## 2015-09-13 DIAGNOSIS — F17210 Nicotine dependence, cigarettes, uncomplicated: Secondary | ICD-10-CM | POA: Insufficient documentation

## 2015-09-13 DIAGNOSIS — F329 Major depressive disorder, single episode, unspecified: Secondary | ICD-10-CM | POA: Diagnosis not present

## 2015-09-13 LAB — BASIC METABOLIC PANEL
Anion gap: 7 (ref 5–15)
BUN: 6 mg/dL (ref 6–20)
CO2: 27 mmol/L (ref 22–32)
CREATININE: 0.84 mg/dL (ref 0.44–1.00)
Calcium: 9.1 mg/dL (ref 8.9–10.3)
Chloride: 101 mmol/L (ref 101–111)
GFR calc Af Amer: >60 mL/min (ref >60)
Glucose, Bld: 104 mg/dL — ABNORMAL HIGH (ref 65–99)
Potassium: 3.3 mmol/L — ABNORMAL LOW (ref 3.5–5.1)
SODIUM: 135 mmol/L (ref 135–145)

## 2015-09-13 LAB — CBC
HEMATOCRIT: 33.4 % — AB (ref 36.0–46.0)
HEMOGLOBIN: 10.8 g/dL — AB (ref 12.0–15.0)
MCH: 26.7 pg (ref 26.0–34.0)
MCHC: 32.3 g/dL (ref 30.0–36.0)
MCV: 82.7 fL (ref 78.0–100.0)
Platelets: 331 10*3/uL (ref 150–400)
RBC: 4.04 MIL/uL (ref 3.87–5.11)
RDW: 12.8 % (ref 11.5–15.5)
WBC: 12.5 10*3/uL — AB (ref 4.0–10.5)

## 2015-09-13 LAB — I-STAT TROPONIN, ED: Troponin i, poc: 0.03 ng/mL (ref 0.00–0.08)

## 2015-09-13 NOTE — ED Notes (Signed)
Pt. reports intermittent central chest pain with SOB , nausea and emesis onset yesterday , denies cough or diaphoresis , pt. added headache today .

## 2015-09-14 ENCOUNTER — Emergency Department (HOSPITAL_COMMUNITY)
Admission: EM | Admit: 2015-09-14 | Discharge: 2015-09-14 | Disposition: A | Discharge status: Home / Self Care | Payer: Medicaid Other | Attending: Emergency Medicine | Admitting: Emergency Medicine

## 2015-09-14 ENCOUNTER — Emergency Department (HOSPITAL_COMMUNITY)
Admission: EM | Admit: 2015-09-14 | Discharge: 2015-09-14 | Disposition: A | Discharge status: Home / Self Care | Payer: Medicaid Other | Source: Home / Self Care

## 2015-09-14 ENCOUNTER — Encounter (HOSPITAL_COMMUNITY): Payer: Self-pay

## 2015-09-14 DIAGNOSIS — F17210 Nicotine dependence, cigarettes, uncomplicated: Secondary | ICD-10-CM | POA: Insufficient documentation

## 2015-09-14 DIAGNOSIS — F329 Major depressive disorder, single episode, unspecified: Secondary | ICD-10-CM | POA: Insufficient documentation

## 2015-09-14 DIAGNOSIS — M4686 Other specified inflammatory spondylopathies, lumbar region: Secondary | ICD-10-CM | POA: Insufficient documentation

## 2015-09-14 DIAGNOSIS — Z5321 Procedure and treatment not carried out due to patient leaving prior to being seen by health care provider: Secondary | ICD-10-CM | POA: Insufficient documentation

## 2015-09-14 DIAGNOSIS — J45909 Unspecified asthma, uncomplicated: Secondary | ICD-10-CM | POA: Insufficient documentation

## 2015-09-14 DIAGNOSIS — G43909 Migraine, unspecified, not intractable, without status migrainosus: Secondary | ICD-10-CM | POA: Insufficient documentation

## 2015-09-14 HISTORY — DX: Obesity, unspecified: E66.9

## 2015-09-14 NOTE — ED Notes (Signed)
Called for room x1.no answer. 

## 2015-09-14 NOTE — ED Notes (Signed)
Unable to locate patient when called for re-assessment x3

## 2015-09-14 NOTE — ED Notes (Signed)
Patient c/o migraine headache. [facility] because there was a 4 hour wait. Patient also c/o N//V.

## 2015-10-10 ENCOUNTER — Encounter (HOSPITAL_COMMUNITY): Payer: Self-pay | Admitting: *Deleted

## 2015-10-10 ENCOUNTER — Emergency Department (HOSPITAL_COMMUNITY)
Admission: EM | Admit: 2015-10-10 | Discharge: 2015-10-10 | Disposition: A | Discharge status: Home / Self Care | Payer: Medicaid Other | Attending: Physician Assistant | Admitting: Physician Assistant

## 2015-10-10 DIAGNOSIS — F17210 Nicotine dependence, cigarettes, uncomplicated: Secondary | ICD-10-CM | POA: Insufficient documentation

## 2015-10-10 DIAGNOSIS — J45909 Unspecified asthma, uncomplicated: Secondary | ICD-10-CM | POA: Diagnosis not present

## 2015-10-10 DIAGNOSIS — M25551 Pain in right hip: Secondary | ICD-10-CM | POA: Insufficient documentation

## 2015-10-10 MED ORDER — OXYCODONE-ACETAMINOPHEN 5-325 MG PO TABS
1.0000 | ORAL_TABLET | Freq: Once | ORAL | Status: AC
  Administered 2015-10-10: 1 via ORAL
  Filled 2015-10-10: qty 1

## 2015-10-10 MED ORDER — OXYCODONE-ACETAMINOPHEN 5-325 MG PO TABS: 1.0000 | ORAL_TABLET | Freq: Four times a day (QID) | ORAL | Status: DC | PRN

## 2015-10-10 NOTE — ED Provider Notes (Signed)
CSN: [id number]     Arrival date & time 10/10/15  2001 History   First MD Initiated Contact with Patient 10/10/15 2131     Chief Complaint  Patient presents with  . Hip Pain     (Consider location/radiation/quality/duration/timing/severity/associated sxs/prior Treatment) HPI   Patient is a 30 year old female presenting with right hip pain. Patient has had chronic back pain in the past had a "back surgery". By Dr. Havery Moros.  She reports that she went to his office late last week for this right hip pain and had normal x-rays. They offered her cortisone shot but she "didn't want tha tbig needle in me". And her primary care doctor gave her some muscle relaxants. She reports the pain is too much. She has no numbness or tingling no recent trauma. No fevers no erythema.  Past Medical History  Diagnosis Date  . Asthma     albuterol inhaler  . Blood transfusion     2008 with c/s 4 units transfused  . Headache(784.0)     otc ibuprofen   . 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 Depression   . Chronic back pain   . Obesity    Past Surgical History  Procedure Laterality Date  . Appendectomy    . Cesarean section    . Cholecystectomy    . Laparoscopy    . Hand surgery    . Tonsillectomy      T & A  . Tubal ligation    . Endometrial ablation    . Laparoscopic hysterectomy Bilateral 09/29/2012    Procedure: HYSTERECTOMY TOTAL LAPAROSCOPIC;  Surgeon: Purcell Nails, MD;  Location: [facility];  Service: Gynecology;  Laterality: Bilateral;  Total Laparoscopic Hysterectomy, Bilateral Salpingectomies, Cystoscopy poss. LAVH poss. TAH  . Laparoscopic assisted vaginal hysterectomy N/A 09/29/2012    Procedure: LAPAROSCOPIC ASSISTED VAGINAL HYSTERECTOMY;  Surgeon: Purcell Nails, MD;  Location: [facility];  Service: Gynecology;  Laterality: N/A;  . Cystoscopy Bilateral 09/29/2012    Procedure: CYSTOSCOPY;  Surgeon: Purcell Nails, MD;  Location: [facility];  Service:  Gynecology;  Laterality: Bilateral;  . Bilateral salpingectomy Bilateral 09/29/2012    Procedure: BILATERAL SALPINGECTOMY;  Surgeon: Purcell Nails, MD;  Location: [facility];  Service: Gynecology;  Laterality: Bilateral;  . Examination under anesthesia N/A 10/16/2012    Procedure: EXAM UNDER ANESTHESIA;  Surgeon: Purcell Nails, MD;  Location: [facility];  Service: Gynecology;  Laterality: N/A;  Suturing Vaginal Cuff  . Abdominal hysterectomy N/A 09/29/2012    Procedure: HYSTERECTOMY ABDOMINAL;  Surgeon: Purcell Nails, MD;  Location: [facility];  Service: Gynecology;  Laterality: N/A;  . Lumbar laminectomy N/A 11/05/2014    Procedure: RIGHT L4-5 MICRODISCECTOMY ;  Surgeon: Kerrin Champagne, MD;  Location: [facility];  Service: Orthopedics;  Laterality: N/A;   Family History  Problem Relation Age of Onset  . Diabetes Mother   . Diabetes Maternal Grandmother    Social History  Substance Use Topics  . Smoking status: Current Every Day Smoker -- 0.00 packs/day    Types: Cigarettes  . Smokeless tobacco: Never Used  . Alcohol Use: 0.6 oz/week    1 Glasses of wine per week     Comment: ocassionally   OB History    Gravida Para Term Preterm AB TAB SAB Ectopic Multiple Living   Review of Systems  Constitutional: Negative for activity change.  Respiratory: Negative for shortness of breath.   Cardiovascular: Negative for chest pain.  Gastrointestinal: Negative for abdominal pain.      Allergies  Bee venom; Reglan; and Aspirin  Home Medications   Prior to Admission medications   Medication Sig Start Date End Date Taking? Authorizing Provider  cyclobenzaprine (FLEXERIL) 10 MG tablet Take 1 tablet (10 mg total) by mouth 2 (two) times daily as needed for muscle spasms. 04/17/15  Yes Benjamin Cartner, PA-C  ibuprofen (ADVIL,MOTRIN) 800 MG tablet Take 800 mg by mouth every 8 (eight) hours as needed for moderate pain.   Yes Historical Provider, MD  QUEtiapine (SEROQUEL XR) 300 MG 24 hr  tablet Take 2 tablets (600 mg total) by mouth at bedtime. Patient taking differently: Take 300-600 mg by mouth at bedtime.  09/09/14  Yes Tiffany Neva SeatGreene, PA-C   BP 125/72 mmHg  Pulse 89  Temp(Src) 98.1 F (36.7 C) (Oral)  Resp 19  Ht 5' (1.524 m)  Wt 225 lb (102.059 kg)  BMI 43.94 kg/m2  SpO2 100%  LMP 07/25/2012 Physical Exam  Constitutional: She is oriented to person, place, and time. She appears well-developed and well-nourished.  HENT:  Head: Normocephalic and atraumatic.  Eyes: Conjunctivae are normal. Right eye exhibits no discharge.  Neck: Neck supple.  Cardiovascular: Normal rate.   No murmur heard. Pulmonary/Chest: Effort normal.  Musculoskeletal:  Pain with movement of right lower extremity. However full range of motion. No erythema or warmth surrounding hip joint. Pulses intact sensation intact. Strength intact.  Neurological: She is oriented to person, place, and time. No cranial nerve deficit.  Skin: Skin is warm and dry. No rash noted. She is not diaphoretic.  Psychiatric: She has a normal mood and affect. Her behavior is normal.  Nursing note and vitals reviewed.   ED Course  Procedures (including critical care time) Labs Review Labs Reviewed - No data to display  Imaging Review No results found. I have personally reviewed and evaluated these images and lab results as part of my medical decision-making.   EKG Interpretation None      MDM   Final diagnoses:  None    Patient is a 30 year old female presenting with right hip pain. Patient has had this worked up by an Scientist, research (life sciences)orthopedist last week. Negative x-rays. Patient has tried muscle relaxants Helped. We'll Give Her 1 Pain Pill Here As Well As Enough to Get through Tomorrow until She Can Follow-Up with Her Primary or Orthopedist for Additional Pain Help. I'm Not Concerned about a Septic Joint, Fracture, or Other Acute Etiology Given the Presentation.    Noell Shular Randall AnLyn Amika Tassin, MD 10/10/15 2141

## 2015-10-10 NOTE — Discharge Instructions (Signed)
Follow-up with your back doctor tomorrow.

## 2015-10-10 NOTE — ED Notes (Signed)
Pt in c/o right hip, lower back, and buttocks pain, states she had back surgery last year and symptoms continue to escalate, no new injury- seen for same Friday and had xray taken

## 2016-01-05 ENCOUNTER — Ambulatory Visit (INDEPENDENT_AMBULATORY_CARE_PROVIDER_SITE_OTHER): Payer: Self-pay | Admitting: Specialist

## 2016-01-10 ENCOUNTER — Emergency Department (HOSPITAL_COMMUNITY)
Admission: EM | Admit: 2016-01-10 | Discharge: 2016-01-10 | Disposition: A | Discharge status: Home / Self Care | Payer: Medicaid Other | Attending: Emergency Medicine | Admitting: Emergency Medicine

## 2016-01-10 ENCOUNTER — Emergency Department (HOSPITAL_COMMUNITY): Payer: Medicaid Other

## 2016-01-10 ENCOUNTER — Encounter (HOSPITAL_COMMUNITY): Payer: Self-pay | Admitting: Emergency Medicine

## 2016-01-10 DIAGNOSIS — J45909 Unspecified asthma, uncomplicated: Secondary | ICD-10-CM | POA: Diagnosis not present

## 2016-01-10 DIAGNOSIS — Y939 Activity, unspecified: Secondary | ICD-10-CM | POA: Insufficient documentation

## 2016-01-10 DIAGNOSIS — S39012A Strain of muscle, fascia and tendon of lower back, initial encounter: Secondary | ICD-10-CM

## 2016-01-10 DIAGNOSIS — S3992XA Unspecified injury of lower back, initial encounter: Secondary | ICD-10-CM | POA: Diagnosis present

## 2016-01-10 DIAGNOSIS — F17210 Nicotine dependence, cigarettes, uncomplicated: Secondary | ICD-10-CM | POA: Diagnosis not present

## 2016-01-10 DIAGNOSIS — Y92410 Unspecified street and highway as the place of occurrence of the external cause: Secondary | ICD-10-CM | POA: Diagnosis not present

## 2016-01-10 DIAGNOSIS — Y999 Unspecified external cause status: Secondary | ICD-10-CM | POA: Diagnosis not present

## 2016-01-10 MED ORDER — CYCLOBENZAPRINE HCL 5 MG PO TABS: 5.0000 mg | ORAL_TABLET | Freq: Two times a day (BID) | ORAL | 0 refills | Status: DC | PRN

## 2016-01-10 MED ORDER — NAPROXEN 500 MG PO TABS: 500.0000 mg | ORAL_TABLET | Freq: Two times a day (BID) | ORAL | 0 refills | Status: DC

## 2016-01-10 MED ORDER — KETOROLAC TROMETHAMINE 60 MG/2ML IM SOLN
60.0000 mg | Freq: Once | INTRAMUSCULAR | Status: AC
  Administered 2016-01-10: 60 mg via INTRAMUSCULAR
  Filled 2016-01-10: qty 2
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
## 2016-01-12 ENCOUNTER — Ambulatory Visit (INDEPENDENT_AMBULATORY_CARE_PROVIDER_SITE_OTHER): Payer: Medicaid Other | Admitting: Specialist

## 2016-02-09 ENCOUNTER — Telehealth (INDEPENDENT_AMBULATORY_CARE_PROVIDER_SITE_OTHER): Payer: Self-pay | Admitting: Specialist

## 2016-02-09 ENCOUNTER — Ambulatory Visit (INDEPENDENT_AMBULATORY_CARE_PROVIDER_SITE_OTHER): Payer: Medicaid Other | Admitting: Specialist

## 2016-02-09 ENCOUNTER — Encounter (INDEPENDENT_AMBULATORY_CARE_PROVIDER_SITE_OTHER): Payer: Self-pay | Admitting: Specialist

## 2016-02-09 VITALS — BP 121/81 | HR 98 | Ht 60.0 in | Wt 227.0 lb

## 2016-02-09 DIAGNOSIS — M5416 Radiculopathy, lumbar region: Secondary | ICD-10-CM | POA: Diagnosis not present

## 2016-02-09 MED ORDER — HYDROCODONE-ACETAMINOPHEN 5-325 MG PO TABS: 1.0000 | ORAL_TABLET | Freq: Four times a day (QID) | ORAL | 0 refills | Status: DC | PRN
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
## 2016-02-09 NOTE — Telephone Encounter (Signed)
Patient needs a 3 week follow up from MRI.

## 2016-02-10 NOTE — Telephone Encounter (Signed)
Will you please open some time for 03/08/16 at 2:45 for patient to return to review MRI. I know its overbooked but this is per Fayrene FearingJames to see her back in 3 weeks. Thanks.

## 2016-02-13 NOTE — Telephone Encounter (Signed)
Open appt for 03/08/16 @ 2:45pm

## 2016-02-28 ENCOUNTER — Telehealth (INDEPENDENT_AMBULATORY_CARE_PROVIDER_SITE_OTHER): Payer: Self-pay | Admitting: Specialist

## 2016-02-28 NOTE — Telephone Encounter (Signed)
Requesting refill of ibprofen   [phone]

## 2016-02-28 NOTE — Telephone Encounter (Signed)
Pt states her landlord sent two letters to be completed in order for her to be able to move out of the two-story apartment to a flat level apartment and they have yet to received them back. Pt request a call back regarding the status of the letter, she states she's having a hard time getting up & down the stairs.  Pt also checking the status of the mri.

## 2016-02-29 MED ORDER — IBUPROFEN 800 MG PO TABS: 800.0000 mg | ORAL_TABLET | Freq: Three times a day (TID) | ORAL | 0 refills | Status: DC | PRN

## 2016-02-29 NOTE — Telephone Encounter (Signed)
Please advise on rx and letter

## 2016-03-01 ENCOUNTER — Telehealth (INDEPENDENT_AMBULATORY_CARE_PROVIDER_SITE_OTHER): Payer: Self-pay | Admitting: *Deleted

## 2016-03-01 NOTE — Telephone Encounter (Signed)
Pt was in to pick up prescription today and was wondering about the status of MRI. P tstated she has a new number and maybe that's why she hasn't been contacted. [phone]

## 2016-03-01 NOTE — Telephone Encounter (Signed)
Noted, Left message to return my call

## 2016-03-08 ENCOUNTER — Ambulatory Visit (INDEPENDENT_AMBULATORY_CARE_PROVIDER_SITE_OTHER): Payer: No Typology Code available for payment source | Admitting: Specialist

## 2016-03-21 ENCOUNTER — Telehealth (INDEPENDENT_AMBULATORY_CARE_PROVIDER_SITE_OTHER): Payer: Self-pay | Admitting: Specialist

## 2016-03-21 NOTE — Telephone Encounter (Signed)
Please advise. Thanks.  

## 2016-03-22 NOTE — Telephone Encounter (Signed)
[facility] Service [phone]

## 2016-03-22 NOTE — Telephone Encounter (Signed)
I can prescribe controlled substances without a reason, presently there are no signs of significant injury or cause to prescribe narcotic pain medication, is she allergic to or has she tried tylenol or advil?

## 2016-03-23 NOTE — Telephone Encounter (Signed)
Called and left a cm advising.

## 2016-03-27 ENCOUNTER — Emergency Department (HOSPITAL_BASED_OUTPATIENT_CLINIC_OR_DEPARTMENT_OTHER): Admit: 2016-03-27 | Discharge: 2016-03-27 | Disposition: A | Discharge status: Home / Self Care | Payer: Medicaid Other

## 2016-03-27 ENCOUNTER — Emergency Department (HOSPITAL_COMMUNITY)
Admission: EM | Admit: 2016-03-27 | Discharge: 2016-03-27 | Disposition: A | Discharge status: Home / Self Care | Payer: Medicaid Other | Attending: Emergency Medicine | Admitting: Emergency Medicine

## 2016-03-27 ENCOUNTER — Encounter (HOSPITAL_COMMUNITY): Payer: Self-pay

## 2016-03-27 DIAGNOSIS — M79609 Pain in unspecified limb: Secondary | ICD-10-CM

## 2016-03-27 DIAGNOSIS — F17210 Nicotine dependence, cigarettes, uncomplicated: Secondary | ICD-10-CM | POA: Diagnosis not present

## 2016-03-27 DIAGNOSIS — M79605 Pain in left leg: Secondary | ICD-10-CM | POA: Insufficient documentation

## 2016-03-27 DIAGNOSIS — M7989 Other specified soft tissue disorders: Secondary | ICD-10-CM

## 2016-03-27 DIAGNOSIS — Z79899 Other long term (current) drug therapy: Secondary | ICD-10-CM | POA: Diagnosis not present

## 2016-03-27 DIAGNOSIS — J45909 Unspecified asthma, uncomplicated: Secondary | ICD-10-CM | POA: Diagnosis not present

## 2016-03-27 LAB — BASIC METABOLIC PANEL
Anion gap: 8 (ref 5–15)
BUN: 7 mg/dL (ref 6–20)
CHLORIDE: 107 mmol/L (ref 101–111)
CO2: 23 mmol/L (ref 22–32)
Calcium: 8.8 mg/dL — ABNORMAL LOW (ref 8.9–10.3)
Creatinine, Ser: 0.88 mg/dL (ref 0.44–1.00)
Glucose, Bld: 96 mg/dL (ref 65–99)
POTASSIUM: 3.7 mmol/L (ref 3.5–5.1)
SODIUM: 138 mmol/L (ref 135–145)

## 2016-03-27 LAB — CBC WITH DIFFERENTIAL/PLATELET
Basophils Absolute: 0 10*3/uL (ref 0.0–0.1)
Basophils Relative: 0 %
EOS ABS: 0.2 10*3/uL (ref 0.0–0.7)
EOS PCT: 2 %
HCT: 35.6 % — ABNORMAL LOW (ref 36.0–46.0)
HEMOGLOBIN: 11.5 g/dL — AB (ref 12.0–15.0)
LYMPHS ABS: 2.3 10*3/uL (ref 0.7–4.0)
Lymphocytes Relative: 23 %
MCH: 27.8 pg (ref 26.0–34.0)
MCHC: 32.3 g/dL (ref 30.0–36.0)
MCV: 86.2 fL (ref 78.0–100.0)
Monocytes Absolute: 0.5 10*3/uL (ref 0.1–1.0)
Monocytes Relative: 5 %
NEUTROS PCT: 70 %
Neutro Abs: 7 10*3/uL (ref 1.7–7.7)
PLATELETS: 295 10*3/uL (ref 150–400)
RBC: 4.13 MIL/uL (ref 3.87–5.11)
RDW: 14 % (ref 11.5–15.5)
WBC: 10 10*3/uL (ref 4.0–10.5)

## 2016-03-27 MED ORDER — IBUPROFEN 800 MG PO TABS: 800.0000 mg | ORAL_TABLET | Freq: Three times a day (TID) | ORAL | 0 refills | Status: DC

## 2016-03-27 MED ORDER — HYDROCODONE-ACETAMINOPHEN 5-325 MG PO TABS: 1.0000 | ORAL_TABLET | ORAL | 0 refills | Status: DC | PRN

## 2016-03-27 MED ORDER — OXYCODONE-ACETAMINOPHEN 5-325 MG PO TABS
1.0000 | ORAL_TABLET | Freq: Once | ORAL | Status: AC
  Administered 2016-03-27: 1 via ORAL
  Filled 2016-03-27: qty 1

## 2016-03-27 NOTE — ED Provider Notes (Signed)
[facility]PT Provider Note   CSN: [id number] Arrival date & time: 03/27/16  4098     History   Chief Complaint Chief Complaint  Patient presents with  . Leg Pain    HPI Yvonne [name] is a 30 y.o. female.  The history is provided by the patient and medical records.    30 year old female with history of arthritis, asthma, chronic back pain, obesity, presenting to the ED for left leg pain and swelling. Patient reports she started working at [employer] [place] on 02/08/16 and has had intermittent issues with her leg swelling since this time. States initially she was having swelling in her right leg, however for the past 2 weeks this mostly been her left leg. States it seems worse from her knee to her foot. States now she is having difficulty wearing shoes as they feel too tight due to the swelling in her foot. States symptoms intermittently improve with elevating her legs.  She denies any numbness or weakness of her legs. No bowel or bladder incontinence. Reports history of DVT and she was pregnant, not currently on blood thinners.  No hx of renal issues.  No chest pain or SOB.  VSS.  Past Medical History:  Diagnosis Date  . 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 Asthma    albuterol inhaler  . Blood transfusion    2008 with c/s 4 units transfused  . Chronic back pain   . Depression   . Headache(784.0)    otc ibuprofen 800mg   . Obesity     Patient Active Problem List   Diagnosis Date Noted  . HNP (herniated nucleus pulposus), lumbar 11/05/2014    Class: Chronic  . S/P endometrial ablation - HTA 2012 06/03/2012  . Sterilization - h/o BTL 06/03/2012  . OBESITY 09/03/2007  . MIGRAINE HEADACHE 09/03/2007    Past Surgical History:  Procedure Laterality Date  . ABDOMINAL HYSTERECTOMY N/A 09/29/2012   Procedure: HYSTERECTOMY ABDOMINAL;  Surgeon: Purcell Nails, MD;  Location: [facility];  Service: Gynecology;  Laterality: N/A;  . APPENDECTOMY    .  BILATERAL SALPINGECTOMY Bilateral 09/29/2012   Procedure: BILATERAL SALPINGECTOMY;  Surgeon: Purcell Nails, MD;  Location: [facility];  Service: Gynecology;  Laterality: Bilateral;  . CESAREAN SECTION    . CHOLECYSTECTOMY    . CYSTOSCOPY Bilateral 09/29/2012   Procedure: CYSTOSCOPY;  Surgeon: Purcell Nails, MD;  Location: [facility];  Service: Gynecology;  Laterality: Bilateral;  . ENDOMETRIAL ABLATION    . EXAMINATION UNDER ANESTHESIA N/A 10/16/2012   Procedure: EXAM UNDER ANESTHESIA;  Surgeon: Purcell Nails, MD;  Location: [facility];  Service: Gynecology;  Laterality: N/A;  Suturing Vaginal Cuff  . HAND SURGERY    . LAPAROSCOPIC ASSISTED VAGINAL HYSTERECTOMY N/A 09/29/2012   Procedure: LAPAROSCOPIC ASSISTED VAGINAL HYSTERECTOMY;  Surgeon: Purcell Nails, MD;  Location: [facility];  Service: Gynecology;  Laterality: N/A;  . LAPAROSCOPIC HYSTERECTOMY Bilateral 09/29/2012   Procedure: HYSTERECTOMY TOTAL LAPAROSCOPIC;  Surgeon: Purcell Nails, MD;  Location: [facility];  Service: Gynecology;  Laterality: Bilateral;  Total Laparoscopic Hysterectomy, Bilateral Salpingectomies, Cystoscopy poss. LAVH poss. TAH  . LAPAROSCOPY    . LUMBAR LAMINECTOMY N/A 11/05/2014   Procedure: RIGHT L4-5 MICRODISCECTOMY ;  Surgeon: Kerrin Champagne, MD;  Location: [facility];  Service: Orthopedics;  Laterality: N/A;  . TONSILLECTOMY     T & A  . TUBAL LIGATION      OB  History    Gravida Para Term Preterm AB Living   4 4 4     1    SAB TAB Ectopic Multiple Live Births           1       Home Medications    Prior to Admission medications   Medication Sig Start Date End Date Taking? Authorizing Provider  cyclobenzaprine (FLEXERIL) 5 MG tablet Take 1 tablet (5 mg total) by mouth 2 (two) times daily as needed for muscle spasms. 01/10/16   Arthor CaptainAbigail Harris, PA-C  HYDROcodone-acetaminophen (NORCO/VICODIN) 5-325 MG tablet TK 1 T PO Q 6 H PRF UP TO 4 DAYS FOR SEVERE PAIN 02/01/16   Historical Provider, MD  HYDROcodone-acetaminophen  (NORCO/VICODIN) 5-325 MG tablet Take 1 tablet by mouth every 6 (six) hours as needed for moderate pain. 02/09/16   Naida SleightJames M Owens, PA-C  ibuprofen (ADVIL,MOTRIN) 800 MG tablet Take 1 tablet (800 mg total) by mouth every 8 (eight) hours as needed for moderate pain. 02/29/16   Kerrin ChampagneJames E Nitka, MD  naproxen (NAPROSYN) 500 MG tablet Take 1 tablet (500 mg total) by mouth 2 (two) times daily with a meal. 01/10/16   Arthor CaptainAbigail Harris, PA-C  oxyCODONE-acetaminophen (PERCOCET/ROXICET) 5-325 MG tablet Take 1 tablet by mouth every 6 (six) hours as needed for severe pain. 10/10/15   Courteney Lyn Mackuen, MD  QUEtiapine (SEROQUEL XR) 300 MG 24 hr tablet Take 2 tablets (600 mg total) by mouth at bedtime. Patient taking differently: Take 300-600 mg by mouth at bedtime.  09/09/14   Marlon Peliffany Greene, PA-C    Family History Family History  Problem Relation Age of Onset  . Diabetes Mother   . Diabetes Maternal Grandmother     Social History Social History  Substance Use Topics  . Smoking status: Current Every Day Smoker    Packs/day: 0.00    Types: Cigarettes  . Smokeless tobacco: Never Used  . Alcohol use 0.6 oz/week    1 Glasses of wine per week     Comment: ocassionally     Allergies   Bee venom; Reglan [metoclopramide hcl]; and Aspirin   Review of Systems Review of Systems  Cardiovascular: Positive for leg swelling.  All other systems reviewed and are negative.    Physical Exam Updated Vital Signs BP 109/68 (BP Location: Left Arm)   Pulse 83   Temp 98.2 F (36.8 C) (Oral)   Resp 18   Ht 5' (1.524 m)   Wt 103 kg   LMP 07/25/2012   SpO2 99%   BMI 44.33 kg/m   Physical Exam  Constitutional: She is oriented to person, place, and time. She appears well-developed and well-nourished.  HENT:  Head: Normocephalic and atraumatic.  Mouth/Throat: Oropharynx is clear and moist.  Eyes: Conjunctivae and EOM are normal. Pupils are equal, round, and reactive to light.  Neck: Normal range of motion.    Cardiovascular: Normal rate, regular rhythm and normal heart sounds.   Pulmonary/Chest: Effort normal and breath sounds normal.  Abdominal: Soft. Bowel sounds are normal.  Musculoskeletal: Normal range of motion.  Left leg mildly swollen compared with right: 1+  edema of both ankles; some tenderness of the left calf without palpable cords; no overlying skin changes or warmth to touch; DP pulses intact bilaterally  Neurological: She is alert and oriented to person, place, and time.  Skin: Skin is warm and dry.  Psychiatric: She has a normal mood and affect.  Nursing note and vitals reviewed.    ED  Treatments / Results  Labs (all labs ordered are listed, but only abnormal results are displayed) Labs Reviewed  CBC WITH DIFFERENTIAL/PLATELET - Abnormal; Notable for the following:       Result Value   Hemoglobin 11.5 (*)    HCT 35.6 (*)    All other components within normal limits  BASIC METABOLIC PANEL - Abnormal; Notable for the following:    Calcium 8.8 (*)    All other components within normal limits    EKG  EKG Interpretation None       Radiology No results found.  *PRELIMINARY RESULTS* Vascular Ultrasound Left lower extremity venous duplex has been completed.  Preliminary findings: No evidence of deep vein thrombosis or baker's cysts in the left lower extremity.    Chauncey FischerCharlotte C Bynum 03/27/2016, 11:06 AM  Procedures Procedures (including critical care time)  Medications Ordered in ED Medications - No data to display   Initial Impression / Assessment and Plan / ED Course  I have reviewed the triage vital signs and the nursing notes.  Pertinent labs & imaging results that were available during my care of the patient were reviewed by me and considered in my medical decision making (see chart for details).  Clinical Course    30 year old female here with left leg pain. Seems worse from the knee to the foot. She does have some calf tenderness on exam. Mild  edema of both ankles. No overlying skin changes or warmth to touch. She is able ambulatory with a steady gait. DP pulses intact bilaterally. Normal strength and sensation of both legs. Screening lab work is overall reassuring. Venous duplex negative for DVT. Patient treated here with pain medication with control of her symptoms. She has remained ambulatory here without difficulty. It seems that these issues have gotten worse since starting her new job at [employer] where she walks around and is on her feet for long periods of time. I recommended trying to rest and elevate her feet when possible. Also recommended compression stockings. Reports she will be on light duty for the next week which should help with her symptoms.  Follow-up with PCP encouraged.  Discussed plan with patient, she acknowledged understanding and agreed with plan of care.  Return precautions given for new or worsening symptoms.  Final Clinical Impressions(s) / ED Diagnoses   Final diagnoses:  Left leg pain    New Prescriptions Discharge Medication List as of 03/27/2016  2:25 PM       Garlon HatchetLisa M Jame Seelig, PA-C 03/27/16 1447    Raeford RazorStephen Kohut, MD 03/27/16 1558

## 2016-03-27 NOTE — Discharge Instructions (Signed)
Take the prescribed medication as directed. °Follow-up with your primary care doctor. °Return to the ED for new or worsening symptoms. °

## 2016-03-27 NOTE — ED Notes (Signed)
Pt transported to vascular.  °

## 2016-03-27 NOTE — Progress Notes (Signed)
*  PRELIMINARY RESULTS* Vascular Ultrasound Left lower extremity venous duplex has been completed.  Preliminary findings: No evidence of deep vein thrombosis or baker's cysts in the left lower extremity.    Yvonne FischerCharlotte C Arielle [name] 03/27/2016, 11:06 AM

## 2016-03-27 NOTE — ED Triage Notes (Signed)
Pt presents for evaluation of L leg/foot pain and swelling x 1 week. Pt states she is unable to wear her shoes due to swelling. Pt. Denies injury to extremity, states difficulty sleeping due to discomfort. Pt. States initially started as tingling/numbness to L knee to foot. Pt. AxO x4, ambulatory on arrival.

## 2016-04-05 ENCOUNTER — Telehealth (INDEPENDENT_AMBULATORY_CARE_PROVIDER_SITE_OTHER): Payer: Self-pay | Admitting: *Deleted

## 2016-04-05 NOTE — Telephone Encounter (Signed)
Pt returned call and aware of appt

## 2016-04-05 NOTE — Telephone Encounter (Signed)
Received authorization from insurance with auth # [id number] valid 03/22/16-04/21/16, [facility] Jan [facility] at 10am with arrival at 945am, [facility] on [address] Pt is to return call for appt

## 2016-04-05 NOTE — Telephone Encounter (Signed)
Pt [facility], pt states she is still in pain and would like to know if she can get some pain medication to help. States she can not do the 4hr shift at work and wants to know if there is a Plan B. Please advise.   Call back [phone]

## 2016-04-05 NOTE — Telephone Encounter (Signed)
Please advise on message below.

## 2016-04-06 ENCOUNTER — Ambulatory Visit (HOSPITAL_COMMUNITY)
Admission: RE | Admit: 2016-04-06 | Discharge: 2016-04-06 | Disposition: A | Discharge status: Home / Self Care | Payer: Medicaid Other | Source: Ambulatory Visit | Attending: Surgery | Admitting: Surgery

## 2016-04-06 ENCOUNTER — Encounter (INDEPENDENT_AMBULATORY_CARE_PROVIDER_SITE_OTHER): Payer: Self-pay | Admitting: Surgery

## 2016-04-06 DIAGNOSIS — M5116 Intervertebral disc disorders with radiculopathy, lumbar region: Secondary | ICD-10-CM | POA: Diagnosis not present

## 2016-04-06 DIAGNOSIS — M5416 Radiculopathy, lumbar region: Secondary | ICD-10-CM | POA: Diagnosis present

## 2016-04-06 DIAGNOSIS — M48061 Spinal stenosis, lumbar region without neurogenic claudication: Secondary | ICD-10-CM | POA: Diagnosis not present

## 2016-04-06 MED ORDER — GADOBENATE DIMEGLUMINE 529 MG/ML IV SOLN
20.0000 mL | Freq: Once | INTRAVENOUS | Status: AC | PRN
Start: 2016-04-06 — End: 2016-04-06
  Administered 2016-04-06: 20 mL via INTRAVENOUS
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
## 2016-04-07 ENCOUNTER — Ambulatory Visit (HOSPITAL_COMMUNITY): Admission: RE | Admit: 2016-04-07 | Payer: Medicaid Other | Source: Ambulatory Visit

## 2016-04-09 ENCOUNTER — Other Ambulatory Visit (INDEPENDENT_AMBULATORY_CARE_PROVIDER_SITE_OTHER): Payer: Self-pay | Admitting: Specialist

## 2016-04-09 ENCOUNTER — Encounter (HOSPITAL_COMMUNITY): Payer: Self-pay | Admitting: *Deleted

## 2016-04-09 MED ORDER — HYDROCODONE-ACETAMINOPHEN 5-325 MG PO TABS: 1.0000 | ORAL_TABLET | Freq: Four times a day (QID) | ORAL | 0 refills | Status: DC | PRN

## 2016-04-09 NOTE — Telephone Encounter (Signed)
Rx printed and signed for patient to pick up. jen See previous note, MRI with large recurrent HNP she is being scheduled for a redo microdiscectomy.

## 2016-04-09 NOTE — Progress Notes (Signed)
Anesthesia Chart Review: SAME DAY WORK-UP.  Patient is a 31 year old female scheduled for right L4-5 microdiscectomy on 04/10/16 by Dr. Otelia SergeantNitka.  History includes smoker, asthma, chronic back pain, depression, anxiety, exertional dyspnea, arthritis, appendectomy, cholecystectomy, tonsillectomy, right L4-5 microdiscectomy 11/05/2014, hysterectomy 09/29/2012, morbid obesity.   PCP is listed as [facility]--there are several visits there (see Care Everywhere), but also with [facility]--although she left without being seen by a provider on 09/13/15 (headache, N/V, chest pain with normal troponin) and 09/14/15 (migraine headache) due to prolonged wait time and was seen at her PCP office on 09/15/15 and prescribed Imitrex.    Meds include Flagyl, Seroquel.  EKG 09/13/15: NSR with sinus arrhythmia, ST/T wave abnormality, consider inferior ischemia. (Previously slightly negative/non-specific T wave abnormality in III only 08/03/12 and 11/22/12 and probably non-specific T wave abnormality in III and aVF on 11/11/13--although some artifact limits interpretation. Inferior T wave abnormality is more pronounced on her 09/13/15 tracing.  CXR 09/13/15: IMPRESSION: No active cardiopulmonary disease.  Labs on 03/27/16 noted. Cr 0.88. H/H 11.5/35.6. Glucose 96.   She denied chest pain during PAT RN phone interview. No documented history of CAD/MI/CHF or diabetes. June EKG showed a little more pronounced inferior T wave abnormality. Will repeat EKG on arrival to re-evaluate. Last labs will be 7614 days old, so would not necessarily need to be repeated unless new changes or ordered by surgeon. She had a hysterectomy in 2014.  Velna Ochsllison Zaine Elsass, PA-C [facility]An[facility]hesiology Phone [phone] 04/09/2016 1:37 PM

## 2016-04-09 NOTE — Progress Notes (Signed)
Spoke with pt for pre-op call. Pt denies cardiac history or chest pain. States she gets sob at times due to her weight.

## 2016-04-10 ENCOUNTER — Ambulatory Visit (HOSPITAL_COMMUNITY): Payer: Medicaid Other | Admitting: Vascular Surgery

## 2016-04-10 ENCOUNTER — Encounter (HOSPITAL_COMMUNITY)
Admission: RE | Disposition: A | Discharge status: Home / Self Care | Payer: Self-pay | Source: Ambulatory Visit | Attending: Specialist

## 2016-04-10 ENCOUNTER — Observation Stay (HOSPITAL_COMMUNITY)
Admission: RE | Admit: 2016-04-10 | Discharge: 2016-04-11 | Disposition: A | Discharge status: Home / Self Care | Payer: Medicaid Other | Source: Ambulatory Visit | Attending: Specialist | Admitting: Specialist

## 2016-04-10 ENCOUNTER — Encounter (HOSPITAL_COMMUNITY): Payer: Self-pay | Admitting: *Deleted

## 2016-04-10 ENCOUNTER — Ambulatory Visit (HOSPITAL_COMMUNITY): Payer: Medicaid Other

## 2016-04-10 DIAGNOSIS — J45909 Unspecified asthma, uncomplicated: Secondary | ICD-10-CM | POA: Diagnosis not present

## 2016-04-10 DIAGNOSIS — F17210 Nicotine dependence, cigarettes, uncomplicated: Secondary | ICD-10-CM | POA: Diagnosis not present

## 2016-04-10 DIAGNOSIS — M48061 Spinal stenosis, lumbar region without neurogenic claudication: Secondary | ICD-10-CM | POA: Diagnosis not present

## 2016-04-10 DIAGNOSIS — M5116 Intervertebral disc disorders with radiculopathy, lumbar region: Principal | ICD-10-CM | POA: Insufficient documentation

## 2016-04-10 DIAGNOSIS — M5126 Other intervertebral disc displacement, lumbar region: Secondary | ICD-10-CM | POA: Diagnosis present

## 2016-04-10 DIAGNOSIS — Z419 Encounter for procedure for purposes other than remedying health state, unspecified: Secondary | ICD-10-CM

## 2016-04-10 DIAGNOSIS — M545 Low back pain: Secondary | ICD-10-CM | POA: Diagnosis present

## 2016-04-10 HISTORY — DX: Carpal tunnel syndrome, right upper limb: G56.01

## 2016-04-10 HISTORY — DX: Pneumonia, unspecified organism: J18.9

## 2016-04-10 HISTORY — DX: Dyspnea, unspecified: R06.00

## 2016-04-10 HISTORY — DX: Anemia, unspecified: D64.9

## 2016-04-10 HISTORY — PX: LUMBAR LAMINECTOMY/DECOMPRESSION MICRODISCECTOMY: SHX5026

## 2016-04-10 HISTORY — DX: Cardiac murmur, unspecified: R01.1

## 2016-04-10 HISTORY — DX: Anxiety disorder, unspecified: F41.9

## 2016-04-10 LAB — SURGICAL PCR SCREEN
MRSA, PCR: NEGATIVE
STAPHYLOCOCCUS AUREUS: NEGATIVE

## 2016-04-10 SURGERY — LUMBAR LAMINECTOMY/DECOMPRESSION MICRODISCECTOMY
Anesthesia: General | Site: Back

## 2016-04-10 MED ORDER — FLEET ENEMA 7-19 GM/118ML RE ENEM: 1.0000 | ENEMA | Freq: Once | RECTAL | Status: DC | PRN

## 2016-04-10 MED ORDER — BUPIVACAINE LIPOSOME 1.3 % IJ SUSP
20.0000 mL | INTRAMUSCULAR | Status: DC
  Filled 2016-04-10: qty 20

## 2016-04-10 MED ORDER — LIDOCAINE 2% (20 MG/ML) 5 ML SYRINGE
INTRAMUSCULAR | Status: AC
  Filled 2016-04-10: qty 5

## 2016-04-10 MED ORDER — BUPIVACAINE LIPOSOME 1.3 % IJ SUSP
INTRAMUSCULAR | Status: DC | PRN
  Administered 2016-04-10 (×2): 5 mL

## 2016-04-10 MED ORDER — THROMBIN 20000 UNITS EX KIT
PACK | CUTANEOUS | Status: DC | PRN
  Administered 2016-04-10: 20 mL via TOPICAL

## 2016-04-10 MED ORDER — 0.9 % SODIUM CHLORIDE (POUR BTL) OPTIME
TOPICAL | Status: DC | PRN
  Administered 2016-04-10: 1000 mL

## 2016-04-10 MED ORDER — SUGAMMADEX SODIUM 500 MG/5ML IV SOLN
INTRAVENOUS | Status: AC
  Filled 2016-04-10: qty 5

## 2016-04-10 MED ORDER — PROMETHAZINE HCL 25 MG/ML IJ SOLN
6.2500 mg | INTRAMUSCULAR | Status: DC | PRN
Start: 2016-04-10 — End: 2016-04-10
  Administered 2016-04-10: 6.25 mg via INTRAVENOUS

## 2016-04-10 MED ORDER — ONDANSETRON HCL 4 MG/2ML IJ SOLN
4.0000 mg | INTRAMUSCULAR | Status: DC | PRN
  Administered 2016-04-10: 4 mg via INTRAVENOUS
  Filled 2016-04-10: qty 2

## 2016-04-10 MED ORDER — METHOCARBAMOL 500 MG PO TABS: 500.0000 mg | ORAL_TABLET | Freq: Four times a day (QID) | ORAL | Status: DC | PRN

## 2016-04-10 MED ORDER — MUPIROCIN 2 % EX OINT
TOPICAL_OINTMENT | CUTANEOUS | Status: AC
  Filled 2016-04-10: qty 22

## 2016-04-10 MED ORDER — ROCURONIUM BROMIDE 100 MG/10ML IV SOLN
INTRAVENOUS | Status: DC | PRN
  Administered 2016-04-10: 50 mg via INTRAVENOUS
  Administered 2016-04-10: 30 mg via INTRAVENOUS

## 2016-04-10 MED ORDER — MIDAZOLAM HCL 2 MG/2ML IJ SOLN
INTRAMUSCULAR | Status: DC | PRN
  Administered 2016-04-10: 2 mg via INTRAVENOUS

## 2016-04-10 MED ORDER — HYDROMORPHONE HCL 1 MG/ML IJ SOLN
0.2500 mg | INTRAMUSCULAR | Status: DC | PRN
Start: 2016-04-10 — End: 2016-04-10
  Administered 2016-04-10: 0.5 mg via INTRAVENOUS

## 2016-04-10 MED ORDER — PROPOFOL 10 MG/ML IV BOLUS
INTRAVENOUS | Status: DC | PRN
  Administered 2016-04-10: 170 mg via INTRAVENOUS
  Administered 2016-04-10: 30 mg via INTRAVENOUS
  Administered 2016-04-10: 50 mg via INTRAVENOUS

## 2016-04-10 MED ORDER — BISACODYL 5 MG PO TBEC: 5.0000 mg | DELAYED_RELEASE_TABLET | Freq: Every day | ORAL | Status: DC | PRN

## 2016-04-10 MED ORDER — PANTOPRAZOLE SODIUM 40 MG IV SOLR
40.0000 mg | Freq: Every day | INTRAVENOUS | Status: DC
  Administered 2016-04-10: 40 mg via INTRAVENOUS
  Filled 2016-04-10: qty 40

## 2016-04-10 MED ORDER — LACTATED RINGERS IV SOLN
INTRAVENOUS | Status: DC
  Administered 2016-04-10 (×3): via INTRAVENOUS

## 2016-04-10 MED ORDER — BUPIVACAINE HCL (PF) 0.5 % IJ SOLN
INTRAMUSCULAR | Status: AC
  Filled 2016-04-10: qty 30

## 2016-04-10 MED ORDER — ROCURONIUM BROMIDE 50 MG/5ML IV SOSY
PREFILLED_SYRINGE | INTRAVENOUS | Status: AC
  Filled 2016-04-10: qty 5

## 2016-04-10 MED ORDER — ALUM & MAG HYDROXIDE-SIMETH 200-200-20 MG/5ML PO SUSP: 30.0000 mL | Freq: Four times a day (QID) | ORAL | Status: DC | PRN

## 2016-04-10 MED ORDER — ACETAMINOPHEN 650 MG RE SUPP: 650.0000 mg | RECTAL | Status: DC | PRN

## 2016-04-10 MED ORDER — METHOCARBAMOL 500 MG PO TABS: 500.0000 mg | ORAL_TABLET | Freq: Three times a day (TID) | ORAL | 1 refills | Status: DC | PRN

## 2016-04-10 MED ORDER — BUPIVACAINE HCL 0.5 % IJ SOLN
INTRAMUSCULAR | Status: DC | PRN
  Administered 2016-04-10 (×2): 5 mL

## 2016-04-10 MED ORDER — HYDROCODONE-ACETAMINOPHEN 5-325 MG PO TABS
1.0000 | ORAL_TABLET | ORAL | Status: DC | PRN
  Administered 2016-04-11: 2 via ORAL
  Filled 2016-04-10: qty 2

## 2016-04-10 MED ORDER — PROPOFOL 10 MG/ML IV BOLUS
INTRAVENOUS | Status: AC
  Filled 2016-04-10: qty 20

## 2016-04-10 MED ORDER — METHOCARBAMOL 1000 MG/10ML IJ SOLN
500.0000 mg | Freq: Four times a day (QID) | INTRAVENOUS | Status: DC | PRN
  Filled 2016-04-10: qty 5

## 2016-04-10 MED ORDER — SODIUM CHLORIDE 0.9 % IV SOLN
INTRAVENOUS | Status: DC
  Administered 2016-04-10: 21:00:00 via INTRAVENOUS

## 2016-04-10 MED ORDER — OXYCODONE-ACETAMINOPHEN 5-325 MG PO TABS
1.0000 | ORAL_TABLET | ORAL | Status: DC | PRN
  Administered 2016-04-11: 2 via ORAL
  Filled 2016-04-10: qty 2

## 2016-04-10 MED ORDER — FENTANYL CITRATE (PF) 100 MCG/2ML IJ SOLN
100.0000 ug | Freq: Once | INTRAMUSCULAR | Status: AC
  Administered 2016-04-10: 100 ug via INTRAVENOUS

## 2016-04-10 MED ORDER — MIDAZOLAM HCL 2 MG/2ML IJ SOLN
INTRAMUSCULAR | Status: AC
  Filled 2016-04-10: qty 2

## 2016-04-10 MED ORDER — ACETAMINOPHEN 500 MG PO TABS
ORAL_TABLET | ORAL | Status: AC
  Filled 2016-04-10: qty 2

## 2016-04-10 MED ORDER — THROMBIN 20000 UNITS EX SOLR
CUTANEOUS | Status: AC
  Filled 2016-04-10: qty 20000

## 2016-04-10 MED ORDER — CHLORHEXIDINE GLUCONATE 4 % EX LIQD: 60.0000 mL | Freq: Once | CUTANEOUS | Status: DC

## 2016-04-10 MED ORDER — PHENOL 1.4 % MT LIQD: 1.0000 | OROMUCOSAL | Status: DC | PRN

## 2016-04-10 MED ORDER — SODIUM CHLORIDE 0.9% FLUSH: 3.0000 mL | INTRAVENOUS | Status: DC | PRN

## 2016-04-10 MED ORDER — ONDANSETRON HCL 4 MG/2ML IJ SOLN
INTRAMUSCULAR | Status: AC
  Filled 2016-04-10: qty 2

## 2016-04-10 MED ORDER — POLYETHYLENE GLYCOL 3350 17 G PO PACK: 17.0000 g | PACK | Freq: Every day | ORAL | Status: DC | PRN

## 2016-04-10 MED ORDER — DOCUSATE SODIUM 100 MG PO CAPS
100.0000 mg | ORAL_CAPSULE | Freq: Two times a day (BID) | ORAL | Status: DC
  Administered 2016-04-10 – 2016-04-11 (×2): 100 mg via ORAL
  Filled 2016-04-10 (×2): qty 1

## 2016-04-10 MED ORDER — FENTANYL CITRATE (PF) 100 MCG/2ML IJ SOLN
INTRAMUSCULAR | Status: DC | PRN
  Administered 2016-04-10 (×2): 50 ug via INTRAVENOUS
  Administered 2016-04-10: 25 ug via INTRAVENOUS
  Administered 2016-04-10: 100 ug via INTRAVENOUS
  Administered 2016-04-10: 25 ug via INTRAVENOUS

## 2016-04-10 MED ORDER — DEXAMETHASONE SODIUM PHOSPHATE 10 MG/ML IJ SOLN
INTRAMUSCULAR | Status: AC
  Filled 2016-04-10: qty 1

## 2016-04-10 MED ORDER — CEFAZOLIN SODIUM-DEXTROSE 2-4 GM/100ML-% IV SOLN
2.0000 g | Freq: Three times a day (TID) | INTRAVENOUS | Status: AC
  Administered 2016-04-11 (×2): 2 g via INTRAVENOUS
  Filled 2016-04-10 (×2): qty 100

## 2016-04-10 MED ORDER — ACETAMINOPHEN 500 MG PO TABS
1000.0000 mg | ORAL_TABLET | Freq: Once | ORAL | Status: AC
  Administered 2016-04-10: 1000 mg via ORAL
  Filled 2016-04-10 (×2): qty 2

## 2016-04-10 MED ORDER — SODIUM CHLORIDE 0.9 % IV SOLN: 250.0000 mL | INTRAVENOUS | Status: DC

## 2016-04-10 MED ORDER — PROMETHAZINE HCL 25 MG/ML IJ SOLN
INTRAMUSCULAR | Status: AC
  Filled 2016-04-10: qty 1

## 2016-04-10 MED ORDER — FENTANYL CITRATE (PF) 100 MCG/2ML IJ SOLN
INTRAMUSCULAR | Status: AC
  Filled 2016-04-10: qty 2

## 2016-04-10 MED ORDER — QUETIAPINE FUMARATE 50 MG PO TABS
300.0000 mg | ORAL_TABLET | Freq: Every day | ORAL | Status: DC
  Administered 2016-04-10: 300 mg via ORAL
  Filled 2016-04-10 (×2): qty 6

## 2016-04-10 MED ORDER — ONDANSETRON HCL 4 MG/2ML IJ SOLN
INTRAMUSCULAR | Status: DC | PRN
  Administered 2016-04-10: 4 mg via INTRAVENOUS

## 2016-04-10 MED ORDER — CEFAZOLIN SODIUM-DEXTROSE 2-4 GM/100ML-% IV SOLN
2.0000 g | INTRAVENOUS | Status: AC
  Administered 2016-04-10: 2 g via INTRAVENOUS
  Filled 2016-04-10: qty 100

## 2016-04-10 MED ORDER — OXYCODONE-ACETAMINOPHEN 5-325 MG PO TABS: 1.0000 | ORAL_TABLET | ORAL | 0 refills | Status: DC | PRN

## 2016-04-10 MED ORDER — MUPIROCIN 2 % EX OINT: 1.0000 "application " | TOPICAL_OINTMENT | Freq: Once | CUTANEOUS | Status: DC

## 2016-04-10 MED ORDER — DEXAMETHASONE SODIUM PHOSPHATE 10 MG/ML IJ SOLN
INTRAMUSCULAR | Status: DC | PRN
  Administered 2016-04-10: 10 mg via INTRAVENOUS

## 2016-04-10 MED ORDER — FENTANYL CITRATE (PF) 100 MCG/2ML IJ SOLN
INTRAMUSCULAR | Status: AC
  Filled 2016-04-10: qty 4

## 2016-04-10 MED ORDER — MORPHINE SULFATE (PF) 2 MG/ML IV SOLN
1.0000 mg | INTRAVENOUS | Status: DC | PRN
  Administered 2016-04-10 – 2016-04-11 (×2): 2 mg via INTRAVENOUS
  Filled 2016-04-10 (×2): qty 1

## 2016-04-10 MED ORDER — MENTHOL 3 MG MT LOZG: 1.0000 | LOZENGE | OROMUCOSAL | Status: DC | PRN

## 2016-04-10 MED ORDER — FENTANYL CITRATE (PF) 100 MCG/2ML IJ SOLN
INTRAMUSCULAR | Status: AC
  Administered 2016-04-10: 100 ug via INTRAVENOUS
  Filled 2016-04-10: qty 2

## 2016-04-10 MED ORDER — SODIUM CHLORIDE 0.9% FLUSH: 3.0000 mL | Freq: Two times a day (BID) | INTRAVENOUS | Status: DC

## 2016-04-10 MED ORDER — HYDROMORPHONE HCL 1 MG/ML IJ SOLN
INTRAMUSCULAR | Status: AC
  Filled 2016-04-10: qty 0.5

## 2016-04-10 MED ORDER — SUGAMMADEX SODIUM 200 MG/2ML IV SOLN
INTRAVENOUS | Status: DC | PRN
  Administered 2016-04-10: 500 mg via INTRAVENOUS

## 2016-04-10 MED ORDER — ACETAMINOPHEN 325 MG PO TABS: 650.0000 mg | ORAL_TABLET | ORAL | Status: DC | PRN

## 2016-04-10 MED ORDER — LIDOCAINE HCL (CARDIAC) 20 MG/ML IV SOLN
INTRAVENOUS | Status: DC | PRN
  Administered 2016-04-10: 80 mg via INTRAVENOUS

## 2016-04-10 SURGICAL SUPPLY — 51 items
BENZOIN TINCTURE PRP APPL 2/3 (GAUZE/BANDAGES/DRESSINGS) ×3 IMPLANT
BUR SABER RD CUTTING 3.0 (BURR) ×2 IMPLANT
BUR SABER RD CUTTING 3.0MM (BURR) ×1
CANISTER SUCTION 2500CC (MISCELLANEOUS) ×3 IMPLANT
CLOSURE STERI-STRIP 1/2X4 (GAUZE/BANDAGES/DRESSINGS) ×1
CLSR STERI-STRIP ANTIMIC 1/2X4 (GAUZE/BANDAGES/DRESSINGS) ×2 IMPLANT
CONT SPECI 4OZ STER CLIK (MISCELLANEOUS) ×3 IMPLANT
COVER SURGICAL LIGHT HANDLE (MISCELLANEOUS) ×6 IMPLANT
DECANTER SPIKE VIAL GLASS SM (MISCELLANEOUS) ×3 IMPLANT
DERMABOND ADVANCED (GAUZE/BANDAGES/DRESSINGS) ×2
DERMABOND ADVANCED .7 DNX12 (GAUZE/BANDAGES/DRESSINGS) ×1 IMPLANT
DRAPE MICROSCOPE LEICA (MISCELLANEOUS) ×3 IMPLANT
DRAPE SURG 17X23 STRL (DRAPES) ×12 IMPLANT
DRSG MEPILEX BORDER 4X4 (GAUZE/BANDAGES/DRESSINGS) IMPLANT
DRSG MEPILEX BORDER 4X8 (GAUZE/BANDAGES/DRESSINGS) ×3 IMPLANT
DURAPREP 26ML APPLICATOR (WOUND CARE) ×3 IMPLANT
ELECT REM PT RETURN 9FT ADLT (ELECTROSURGICAL) ×3
ELECTRODE REM PT RTRN 9FT ADLT (ELECTROSURGICAL) ×1 IMPLANT
EVACUATOR 1/8 PVC DRAIN (DRAIN) IMPLANT
GLOVE BIOGEL PI IND STRL 6.5 (GLOVE) ×1 IMPLANT
GLOVE BIOGEL PI IND STRL 8 (GLOVE) ×1 IMPLANT
GLOVE BIOGEL PI INDICATOR 6.5 (GLOVE) ×2
GLOVE BIOGEL PI INDICATOR 8 (GLOVE) ×2
GLOVE ECLIPSE 9.0 STRL (GLOVE) ×3 IMPLANT
GLOVE ORTHO TXT STRL SZ7.5 (GLOVE) ×3 IMPLANT
GLOVE SURG 8.5 LATEX PF (GLOVE) ×3 IMPLANT
GLOVE SURG SS PI 6.5 STRL IVOR (GLOVE) ×6 IMPLANT
GOWN STRL REUS W/ TWL LRG LVL3 (GOWN DISPOSABLE) ×1 IMPLANT
GOWN STRL REUS W/TWL 2XL LVL3 (GOWN DISPOSABLE) ×6 IMPLANT
GOWN STRL REUS W/TWL LRG LVL3 (GOWN DISPOSABLE) ×2
KIT BASIN OR (CUSTOM PROCEDURE TRAY) ×3 IMPLANT
KIT ROOM TURNOVER OR (KITS) ×3 IMPLANT
NEEDLE SPNL 18GX3.5 QUINCKE PK (NEEDLE) ×6 IMPLANT
NS IRRIG 1000ML POUR BTL (IV SOLUTION) ×3 IMPLANT
PACK LAMINECTOMY ORTHO (CUSTOM PROCEDURE TRAY) ×3 IMPLANT
PAD ARMBOARD 7.5X6 YLW CONV (MISCELLANEOUS) ×6 IMPLANT
PATTIES SURGICAL .5 X.5 (GAUZE/BANDAGES/DRESSINGS) IMPLANT
PATTIES SURGICAL .75X.75 (GAUZE/BANDAGES/DRESSINGS) ×3 IMPLANT
PATTIES SURGICAL 1X1 (DISPOSABLE) IMPLANT
SPONGE LAP 4X18 X RAY DECT (DISPOSABLE) IMPLANT
SPONGE SURGIFOAM ABS GEL 100 (HEMOSTASIS) IMPLANT
SUT VIC AB 0 CT1 27 (SUTURE)
SUT VIC AB 0 CT1 27XBRD ANBCTR (SUTURE) IMPLANT
SUT VIC AB 1 CT1 27 (SUTURE) ×2
SUT VIC AB 1 CT1 27XBRD ANBCTR (SUTURE) ×1 IMPLANT
SUT VIC AB 2-0 CT1 27 (SUTURE) ×2
SUT VIC AB 2-0 CT1 TAPERPNT 27 (SUTURE) ×1 IMPLANT
SUT VIC AB 3-0 X1 27 (SUTURE) ×3 IMPLANT
SUT VICRYL 0 UR6 27IN ABS (SUTURE) IMPLANT
TOWEL OR 17X24 6PK STRL BLUE (TOWEL DISPOSABLE) ×3 IMPLANT
TOWEL OR 17X26 10 PK STRL BLUE (TOWEL DISPOSABLE) ×3 IMPLANT

## 2016-04-10 NOTE — Progress Notes (Signed)
Pt c/o migraine head ache. Dr Jacklynn BueMassagee called and informed. New orders noted for tylenol pt informed that we are waiting on pharmacy to send it up.

## 2016-04-10 NOTE — Op Note (Signed)
04/10/2016  6:46 PM  PATIENT:  Yvonne [name]  31 y.o. female  MRN: [id number]  OPERATIVE REPORT  PRE-OPERATIVE DIAGNOSIS:  L4-5 Large Disc Extrusion with Severe Spinal Stenosis  POST-OPERATIVE DIAGNOSIS:  L4-5 Large Disc Extrusion with Severe Spinal Stenosis  PROCEDURE:  Procedure(s): Right L4-5 Microdiscectomy    SURGEON:  Jessy Oto, MD     ASSISTANT:  Benjiman Core, PA-C  (Present throughout the entire procedure and necessary for completion of procedure in a timely manner)     ANESTHESIA:  General,    COMPLICATIONS:  None.     DRAINS: None  SPECIMEN: Extradural mass compressing the thecal sac dorsolaterally at the level of the right L5 foramen and right L5 pedicle. Resected material was rubbery consistency, easily dissected off the thecal sac but  Required a kerrison to debride. This material was sent to Pathology for evaluation.    PROCEDURE:The patient was met in the holding area, and the appropriate right Lumbar level L4-5 identified and marked with "x" and my initials.The patient was then transported to OR and was placed under general anesthesia without difficulty. The patient received appropriate preoperative antibiotic prophylaxis. The patient after intubation atraumatically was transferred to the operating room table, prone position, Wilson frame, sliding OR table. All pressure points were well padded. The arms in 90-90 well-padded at the elbows. Standard prep with iodofor solution lower dorsal spine to the mid sacral segment. Draped in the usual manner clear Vi-Drape was used. Time-out procedure was called and correct. An 18-gauge spinal needle was then inserted at the expected L4-5 level. C-arm was draped sterilely to the field and used to identify the spinal needles positions. The lower needle was at the lower aspect of the lamina of L4. Skin superior to this was then infiltrated with Marcaine half percent 1:1 exparel 1.3%  total of 20 cc used. An incision approximately  an inch inch and a half in length was then made through skin and subcutaneous layers in line with the right side of the expected midline just inferior to the spinal needle entry point. An incision made into the right lumbosacral fascia approximately an inch in length .  Cobb elevator was then introduced into the incision site and used to carefully form subperiosteal dissection of the paralumbar muscles off of the posterior lamina of the expected right L4-5 level.  The depth measured off of the Cobb elevator  100 mm and 85 mm Varitrac retractors then placed on the scaffolding for the Varitrac equipment and guideddown to and docking on the posterior aspect of the lamina at the expected right L4-5 level.  Cross table lateral radiograph was identified the dilators and the retractors at the appropriate level L4-5. The operating room microscope sterilely draped brought into the field. Under the operating room microscope, the L4-5 interspace carefully debrided the small amount of muscle attachment here and high-speed bur used to drill the medial aspect of the inferior articular process of L4 approximately 20%. A localization lateral cross table radiograph view was obtained with Penfield 4 in the right L4-5 facet. 2 mm Kerrison then used to enter the spinal canal over the superior aspect of the L5 lamina carefully using the Kerrison to debris the attachment as a curet. The right L5 superior hemilamina was then resected caudally to the area of the L5-S1 ligamentum flavum attactment. Care taken to preserve an adequate amount of the right L5 pars 7 mm thickness.  Foraminotomy was then performed over the right L5 nerve root. And  a partial right L5-S1 medial facetectomy.  The medial 10% superior articular process of L5 and then resected using 2 mm Kerrison. This allowed for identification of the thecal sac. Penfield 4 was then used to carefully mobilize the thecal sac medially and the L5 nerve root identified within the lateral  recess flattened over the posterior aspect nerve root and thecal sac by rubbery material that easily dissected off the posterior and lateral aspect of the thecal sac at the L5 foramen and right L5 pedicle level of the protruded disc.The material was a rubbery consistency and located over the dorsal aspect of the right L5 nerve root and right posterolateral thecal sac at the right L5 foramen and right L5 pedicle level. Carefully the lateral aspect of the L5 nerve root was identified and a Penfield 4 was used to mobilize the nerve medially such that the protruded disc was visible with microscope. Using a Penfield 4 for retraction and a 15 blade scalpel was used to incise the posterior longitudinal ligament within the lateral recess on the left side longitudinally. Disc material was removed using micropituitary rongeurs and nerve hook nerve root and then more easily able to be mobilized medially and retracted using a Derricho retractor. Further foraminotomies was performed over the L5 nerve root the nerve root was noted to be exiting without further compression. The nerve root able to be retracted along the medial aspect of the L5 pedicle and disc material found to be subligamentous at this level was further resected current pituitary rongeurs.  Ligamentum flavum was further debrided superiorly to the level L4-5 disc. There was fat over the right side of the thecal sac at the level of the L4-5 disc and medial to the right L4-5 facet. This fat was left intact. A small amount of further resection of the L4 lamina inferiorly and medially was performed. With this then the disc space at L4-5 was easily visualized and entry into the disc at the sided disc herniation was possible using a Penfield 4.Micropituitary was used to further debride this material superficially from the posterior aspect of the intervertebral disc is posterior lateral aspect of the disc. Small amount of further disc material was found subligamentous  extending laterally from the disc this was removed using micropituitary rongeurs into the left L4 neuroforamen additionally epstein  currettes were used to decompress the left L4 neuroforamen. Upbiting currettes were used to further remove reflected portions of the reflected portions of the medial L4-5 facet and portions of the superior portion of the L5 superior articular  Facet. Ligamentum flavum was debrided and lateral recess along the medial aspect L4-5 facet no further decompression was necessary. Ball tip nerve probe was then able to carefully palpate the neuroforamen for L4 and L5 finding these to be well decompressed. Bleeding was then controlled using thrombin-soaked Gelfoam small cottonoids. Small amount of bleeding within the soft tissue mass the laminotomy area was controlled using bipolar electrocautery. Irrigation was carried out using copious amounts of irrigant solution. All Gelfoam were then removed. No significant active bleeding present at the time of removal. All instruments sponge counts were correct traction system was then carefully removed carefully rotating retractors with this withdrawal and only bipolar electrocautery of any small bleeders. Lumbodorsal fascia was then carefully approximated with interrupted 0 Vicryl sutures, UR 6 needle deep subcutaneous layers were approximated with interrupted 0 Vicryl sutures on UR 6 the appear subcutaneous layers approximated with interrupted 2-0 Vicryl sutures and the skin closed with a running subcutaneous stitch  of 4-0 Vicryl. Dermabond was applied allowed to dry and then Mepilex bandage applied. Patient was then carefully returned to supine position on a stretcher, reactivated and extubated. He was then returned to recovery room in satisfactory condition. Benjiman Core PA-C perform the duties of assistant surgeon during this case. he was present from the beginning of the case to the end of the case assisting in transfer the patient from his  stretcher to the OR table and back to the stretcher at the end of the case. Assisted in careful retraction and suction of the laminectomy site delicate neural structures operating under the operating room microscope. he performed closure of the incision from the fascia to the skin applying the dressing.     Jessy Oto  04/10/2016, 6:46 PM

## 2016-04-10 NOTE — Transfer of Care (Signed)
Immediate Anesthesia Transfer of Care Note  Patient: Yvonne [name]  Procedure(s) Performed: Procedure(s): Right L4-5 Microdiscectomy (N/A)  Patient Location: PACU  Anesthesia Type:General  Level of Consciousness: awake, oriented and patient cooperative  Airway & Oxygen Therapy: Patient Spontanous Breathing and Patient connected to nasal cannula oxygen  Post-op Assessment: Report given to RN and Patient moving all extremities X 4  Post vital signs: Reviewed and stable  Last Vitals:  Vitals:   04/10/16 1228 04/10/16 1231  BP:  (!) 119/50  Pulse: 79   Resp: 18     Last Pain:  Vitals:   04/10/16 1534  TempSrc:   PainSc: 5       Patients Stated Pain Goal: 2 (04/10/16 1332)  Complications: No apparent anesthesia complications

## 2016-04-10 NOTE — Brief Op Note (Signed)
04/10/2016  6:26 PM  PATIENT:  Yvonne [name]  31 y.o. female  PRE-OPERATIVE DIAGNOSIS:  L4-5 Large Disc Extrusion with Severe Spinal Stenosis  POST-OPERATIVE DIAGNOSIS:  L4-5 Large Disc Extrusion with Severe Spinal Stenosis, extradural mass, not usual disc material consistency.  PROCEDURE:  Procedure(s): Right L4-5 Microdiscectomy (N/A)  SURGEON:  Surgeon(s) and Role:    * Kerrin ChampagneJames E Juanda Luba, MD - Primary  PHYSICIAN ASSISTANT: Zonia KiefJames Owens, PA-C  ANESTHESIA:   local and general, Dr. Jacklynn BueMassagee.  EBL:  Total I/O In: 1000 [I.V.:1000] Out: 100 [Blood:100]  DRAINS: none   LOCAL MEDICATIONS USED:  MARCAINE 0.5% 1:1 EXPAREL 1.3%    Amount: 20 ml  SPECIMEN:  Source of Specimen:  Extradural mass right posterior spinal canal, rubbery, thickened material not usual disc material consistency.  DISPOSITION OF SPECIMEN:  PATHOLOGY  COUNTS:  YES  DICTATION: .Dragon Dictation  PLAN OF CARE: Admit for overnight observation  PATIENT DISPOSITION:  PACU - hemodynamically stable.   Delay start of Pharmacological VTE agent (>24hrs) due to surgical blood loss or risk of bleeding: yes

## 2016-04-10 NOTE — Progress Notes (Signed)
Patient is aware that rx is ready for pick up 
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
## 2016-04-10 NOTE — Anesthesia Procedure Notes (Signed)
Procedure Name: Intubation Date/Time: 04/10/2016 4:35 PM Performed by: Sampson Si E Pre-anesthesia Checklist: Patient identified, Emergency Drugs available, Suction available and Patient being monitored Patient Re-evaluated:Patient Re-evaluated prior to inductionOxygen Delivery Method: Circle System Utilized Preoxygenation: Pre-oxygenation with 100% oxygen Intubation Type: IV induction Ventilation: Mask ventilation without difficulty Laryngoscope Size: Mac Grade View: Grade I Tube type: Oral Tube size: 7.0 mm Number of attempts: 1 Airway Equipment and Method: Stylet and Oral airway Placement Confirmation: ETT inserted through vocal cords under direct vision,  positive ETCO2 and breath sounds checked- equal and bilateral Secured at: 21 cm Tube secured with: Tape Dental Injury: Teeth and Oropharynx as per pre-operative assessment

## 2016-04-10 NOTE — Progress Notes (Signed)
2 bags of belongings taken to PACU. Valuables envelope taken to security.

## 2016-04-10 NOTE — Telephone Encounter (Signed)
Patient is aware that rx is ready for pick up 

## 2016-04-10 NOTE — Discharge Instructions (Signed)
° ° °  No lifting greater than 10 lbs. °Avoid bending, stooping and twisting. °Walk in house for first week them may start to get out slowly increasing distance up to one quarter mile by 3 weeks post op. °Keep incision dry for 3 days, may use tegaderm or similar water impervious dressing. ° °

## 2016-04-10 NOTE — Anesthesia Preprocedure Evaluation (Addendum)
Anesthesia Evaluation  Patient identified by MRN, date of birth, ID band Patient awake    Reviewed: Allergy & Precautions, NPO status , Patient's Chart, lab work & pertinent test results  Airway Mallampati: II  TM Distance: >3 FB Neck ROM: Full    Dental  (+) Teeth Intact, Dental Advisory Given   Pulmonary neg pulmonary ROS, shortness of breath, asthma , Current Smoker,    breath sounds clear to auscultation       Cardiovascular negative cardio ROS   Rhythm:Regular Rate:Normal     Neuro/Psych  Headaches,  Neuromuscular disease negative neurological ROS  negative psych ROS   GI/Hepatic negative GI ROS, Neg liver ROS,   Endo/Other  negative endocrine ROS  Renal/GU negative Renal ROS  negative genitourinary   Musculoskeletal negative musculoskeletal ROS (+)   Abdominal   Peds negative pediatric ROS (+)  Hematology negative hematology ROS (+)   Anesthesia Other Findings   Reproductive/Obstetrics negative OB ROS                            Anesthesia Physical Anesthesia Plan  ASA: II  Anesthesia Plan: General   Post-op Pain Management:    Induction: Intravenous  Airway Management Planned: Oral ETT  Additional Equipment:   Intra-op Plan:   Post-operative Plan: Extubation in OR  Informed Consent: I have reviewed the patients History and Physical, chart, labs and discussed the procedure including the risks, benefits and alternatives for the proposed anesthesia with the patient or authorized representative who has indicated his/her understanding and acceptance.   Dental advisory given  Plan Discussed with: CRNA and Surgeon  Anesthesia Plan Comments:         Anesthesia Quick Evaluation

## 2016-04-11 ENCOUNTER — Encounter (HOSPITAL_COMMUNITY): Payer: Self-pay | Admitting: Specialist

## 2016-04-11 DIAGNOSIS — M5116 Intervertebral disc disorders with radiculopathy, lumbar region: Secondary | ICD-10-CM | POA: Diagnosis not present

## 2016-04-11 DIAGNOSIS — M5126 Other intervertebral disc displacement, lumbar region: Secondary | ICD-10-CM

## 2016-04-11 MED ORDER — DOCUSATE SODIUM 100 MG PO CAPS: 100.0000 mg | ORAL_CAPSULE | Freq: Two times a day (BID) | ORAL | 0 refills | Status: DC

## 2016-04-11 NOTE — Care Management Note (Signed)
Case Management Note  Patient Details  Name: Yvonne [name] MRN: [id number] Date of Birth: 1986/03/19  Subjective/Objective:   Pt s/p L4-5 Microdiscectomy. She is from home with her spouse.                Action/Plan: [facility]. Pt does not qualify under her Medicaid for HHPT with her current surgery. Pt made aware. CM offered private pay and pt refused. Pt does want the walker and 3 in1. [facility]Sh[facility] notified and will deliver the equipment to the room. Pt states she has transportation home.   Expected Discharge Date:                  Expected Discharge Plan:  Home/Self Care  In-House Referral:     Discharge planning Services  CM Consult  Post Acute Care Choice:  Durable Medical Equipment ([facility] with [facility] current diagnosis under medicaid) Choice offered to:  Patient  DME Arranged:  3-N-1, Walker rolling DME Agency:  [facility]  HH Arranged:    HH Agency:     Status of Service:  Completed, signed off  If discussed at Long Length of Stay Meetings, dates discussed:    Additional Comments:  Kermit BaloKelli F Tyton Abdallah, RN 04/11/2016, 10:52 AM
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
## 2016-04-11 NOTE — Progress Notes (Signed)
Patient was discharged home. Discharge instructions were reviewed with the patient. Patient verbalized understanding.

## 2016-04-11 NOTE — Progress Notes (Signed)
Subjective: 1 Day Post-Op Procedure(s) (LRB): Right L4-5 Microdiscectomy (N/A) Awake, alert and oriented x 4. Up to bathroom. Lives upstairs, will need PT and OT to be sure she is able to use stairs. Plans to stay with her mother post op recovery. Patient reports pain as moderate.    Objective:   VITALS:  Temp:  [97.8 F (36.6 C)-98.6 F (37 C)] 98.1 F (36.7 C) (01/10 0812) Pulse Rate:  [63-86] 68 (01/10 0812) Resp:  [12-19] 18 (01/10 0812) BP: (97-132)/(49-91) 117/79 (01/10 0812) SpO2:  [95 %-100 %] 100 % (01/10 0812) Weight:  [227 lb (103 kg)] 227 lb (103 kg) (01/09 1228)  Neurologically intact ABD soft Neurovascular intact Sensation intact distally Intact pulses distally Dorsiflexion/Plantar flexion intact Incision: no drainage   LABS No results for input(s): HGB, WBC, PLT in the last 72 hours. No results for input(s): NA, K, CL, CO2, BUN, CREATININE, GLUCOSE in the last 72 hours. No results for input(s): LABPT, INR in the last 72 hours.   Assessment/Plan: 1 Day Post-Op Procedure(s) (LRB): Right L4-5 Microdiscectomy (N/A)  Up with therapy D/C IV fluids Discharge home with home health  Kerrin Champagne 04/11/2016, 9:10 AM Patient ID: Annye English, female   DOB: February 07, 1986, 31 y.o.   MRN: [id number]
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
## 2016-04-12 ENCOUNTER — Encounter (HOSPITAL_COMMUNITY): Payer: Self-pay | Admitting: Specialist

## 2016-04-12 NOTE — Anesthesia Postprocedure Evaluation (Addendum)
Anesthesia Post Note  Patient: Yvonne [name]  Procedure(s) Performed: Procedure(s) (LRB): Right L4-5 Microdiscectomy (N/A)  Patient location during evaluation: PACU Anesthesia Type: General Level of consciousness: awake and alert Pain management: pain level controlled Vital Signs Assessment: post-procedure vital signs reviewed and stable Respiratory status: spontaneous breathing, nonlabored ventilation, respiratory function stable and patient connected to nasal cannula oxygen Cardiovascular status: blood pressure returned to baseline and stable Postop Assessment: no signs of nausea or vomiting Anesthetic complications: no       Last Vitals:  Vitals:   04/11/16 0555 04/11/16 0812  BP: (!) 97/49 117/79  Pulse: 63 68  Resp: 18 18  Temp: 37 C 36.7 C    Last Pain:  Vitals:   04/11/16 1140  TempSrc:   PainSc: 8                  [name] S

## 2016-04-16 ENCOUNTER — Encounter (INDEPENDENT_AMBULATORY_CARE_PROVIDER_SITE_OTHER): Payer: Self-pay | Admitting: Specialist

## 2016-04-16 ENCOUNTER — Ambulatory Visit (INDEPENDENT_AMBULATORY_CARE_PROVIDER_SITE_OTHER): Payer: Medicaid Other | Admitting: Specialist

## 2016-04-16 VITALS — BP 128/48 | HR 75 | Ht 60.0 in | Wt 227.0 lb

## 2016-04-16 DIAGNOSIS — Z9889 Other specified postprocedural states: Secondary | ICD-10-CM

## 2016-04-16 MED ORDER — OXYCODONE-ACETAMINOPHEN 5-325 MG PO TABS: 1.0000 | ORAL_TABLET | Freq: Four times a day (QID) | ORAL | 0 refills | Status: DC | PRN

## 2016-04-16 NOTE — Patient Instructions (Signed)
You  must avoid lifting, bending, twisting.    Gradually increase walking distances over the next few weeks.  No aggressive activity.

## 2016-04-16 NOTE — Progress Notes (Signed)
Post-Op Visit Note   Patient: Yvonne [name]           Date of Birth: [birth year]           MRN: [id number] Visit Date: 04/16/2016 PCP: [facility]   Assessment & Plan:  Chief Complaint:  Ms. Kinsel is 6 days out from Right L4-5 Microdiscectomy on 04/10/2016.  She states that she is dropping things.  She says her landlord is slow about getting the transfer done, that she has to go up and down to flights of stairs, that she has to do her own laundry.  States that her preoperative leg pain is better. No complaints of bowel or bladder incontinence.   Exam: Pleasant black female alert and oriented and in no acute distress. Ambulate with a walker. Surgical incision is healing well without signs of infection. New Steri-Strips were applied. Bilateral calves nontender. Neurovascularly intact. No focal motor deficits.   Visit Diagnoses:  1. S/P lumbar microdiscectomy     Plan: Patient will gradually increase walking distances over the next few weeks. No aggressive activity. Must avoid bending, twisting, lifting she does understand the risk of disc rerupture if she is not compliant. Voices understanding. Continue out of work. We'll follow-up in 4 weeks for recheck. Refilled Percocet.  Follow-Up Instructions: Return in about 4 weeks (around 05/14/2016) for postop Dr Otelia Sergeant.   Orders:  No orders of the defined types were placed in this encounter.  Meds ordered this encounter  Medications  . oxyCODONE-acetaminophen (ROXICET) 5-325 MG tablet    Sig: Take 1 tablet by mouth every 6 (six) hours as needed for severe pain.    Dispense:  60 tablet    Refill:  0    Imaging: No results found.  PMFS History: Patient Active Problem List   Diagnosis Date Noted  . Herniated nucleus pulposus, lumbar 04/10/2016  . HNP (herniated nucleus pulposus), lumbar 11/05/2014    Class: Chronic  . S/P endometrial ablation - HTA 2012 06/03/2012  . Sterilization - h/o BTL 06/03/2012  . OBESITY  09/03/2007  . MIGRAINE HEADACHE 09/03/2007   Past Medical History:  Diagnosis Date  . Anemia   . Anxiety   . 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 Asthma    albuterol inhaler  . Blood transfusion    2008 with c/s 4 units transfused  . Carpal tunnel syndrome on right   . Chronic back pain   . Depression   . Dyspnea    exertion  . Headache(784.0)    otc ibuprofen 800mg   . Heart murmur    as a child  . Obesity   . Pneumonia    as a child    Family History  Problem Relation Age of Onset  . Diabetes Mother   . Diabetes Maternal Grandmother   . Stroke Father     Past Surgical History:  Procedure Laterality Date  . ABDOMINAL HYSTERECTOMY N/A 09/29/2012   Procedure: HYSTERECTOMY ABDOMINAL;  Surgeon: Purcell Nails, MD;  Location: [facility];  Service: Gynecology;  Laterality: N/A;  . APPENDECTOMY    . BILATERAL SALPINGECTOMY Bilateral 09/29/2012   Procedure: BILATERAL SALPINGECTOMY;  Surgeon: Purcell Nails, MD;  Location: [facility];  Service: Gynecology;  Laterality: Bilateral;  . CESAREAN SECTION    . CHOLECYSTECTOMY    . CYSTOSCOPY Bilateral 09/29/2012   Procedure: CYSTOSCOPY;  Surgeon: Purcell Nails, MD;  Location: [facility];  Service: Gynecology;  Laterality: Bilateral;  . ENDOMETRIAL ABLATION    . EXAMINATION UNDER ANESTHESIA N/A 10/16/2012   Procedure: EXAM UNDER ANESTHESIA;  Surgeon: Purcell NailsAngela Y Roberts, MD;  Location: [facility];  Service: Gynecology;  Laterality: N/A;  Suturing Vaginal Cuff  . HAND SURGERY    . LAPAROSCOPIC ASSISTED VAGINAL HYSTERECTOMY N/A 09/29/2012   Procedure: LAPAROSCOPIC ASSISTED VAGINAL HYSTERECTOMY;  Surgeon: Purcell NailsAngela Y Roberts, MD;  Location: [facility];  Service: Gynecology;  Laterality: N/A;  . LAPAROSCOPIC HYSTERECTOMY Bilateral 09/29/2012   Procedure: HYSTERECTOMY TOTAL LAPAROSCOPIC;  Surgeon: Purcell NailsAngela Y Roberts, MD;  Location: [facility];  Service: Gynecology;  Laterality: Bilateral;  Total Laparoscopic Hysterectomy, Bilateral  Salpingectomies, Cystoscopy poss. LAVH poss. TAH  . LAPAROSCOPY    . LUMBAR LAMINECTOMY N/A 11/05/2014   Procedure: RIGHT L4-5 MICRODISCECTOMY ;  Surgeon: Kerrin ChampagneJames E Nitka, MD;  Location: [facility];  Service: Orthopedics;  Laterality: N/A;  . LUMBAR LAMINECTOMY/DECOMPRESSION MICRODISCECTOMY N/A 04/10/2016   Procedure: Right L4-5 Microdiscectomy;  Surgeon: Kerrin ChampagneJames E Nitka, MD;  Location: [facility];  Service: Orthopedics;  Laterality: N/A;  . TONSILLECTOMY     T & A  . TUBAL LIGATION     Social History   Occupational History  . Not on file.   Social History Main Topics  . Smoking status: Current Every Day Smoker    Packs/day: 0.00    Types: Cigarettes  . Smokeless tobacco: Never Used     Comment: 1-2 cigarettes a day  . Alcohol use 0.6 oz/week    1 Glasses of wine per week     Comment: ocassionally  . Drug use: No  . Sexual activity: Yes    Birth control/ protection: Surgical, Condom     Comment: surgical tubal

## 2016-04-17 ENCOUNTER — Telehealth (INDEPENDENT_AMBULATORY_CARE_PROVIDER_SITE_OTHER): Payer: Self-pay | Admitting: Specialist

## 2016-04-17 NOTE — Telephone Encounter (Signed)
Patient called advised she still cannot get her Rx for (oxycodone) The pharmacy said a pre auth. paper had to be faxed over before she can get the prescription. The number to contact patient is [phone]

## 2016-04-17 NOTE — Telephone Encounter (Signed)
Faxed in Prior auth form to insurance

## 2016-04-23 ENCOUNTER — Telehealth (INDEPENDENT_AMBULATORY_CARE_PROVIDER_SITE_OTHER): Payer: Self-pay | Admitting: *Deleted

## 2016-04-23 NOTE — Telephone Encounter (Signed)
I called and advised patient that I refaxed the form that it was missing a quantity so I filled that in and sent back ad that it should be approved with 24 hours. 

## 2016-04-23 NOTE — Telephone Encounter (Signed)
Patient called in this afternoon regarding her prescription refill. Patient stated medicaid wouldn't pay for two weeks worth? She would like a CB regarding this please at [phone]. Thank you

## 2016-04-23 NOTE — Telephone Encounter (Signed)
Patient called in this morning in regards to needing some help with getting her pain medicine approved? I assured her we already sent it the prior-authorization for it and not sure what else we could do on our end? Her CB # [phone]. Thank you

## 2016-04-23 NOTE — Telephone Encounter (Signed)
I called and advised patient that I refaxed the form that it was missing a quantity so I filled that in and sent back ad that it should be approved with 24 hours.

## 2016-04-24 NOTE — Telephone Encounter (Signed)
I called [facility] and patients rx for Oxycodone 5-325 Auth # is [id number], the interaction ID for the call # [id number]. I called patient to advise and she asked that I call the pharmacy and let them know which I called and advised [facility] [address]

## 2016-05-07 ENCOUNTER — Telehealth (INDEPENDENT_AMBULATORY_CARE_PROVIDER_SITE_OTHER): Payer: Self-pay | Admitting: Specialist

## 2016-05-07 NOTE — Telephone Encounter (Signed)
Patient called again in regards to her medication that medicaid won't pay for.  She stated that her insurance only paid for a week worth of medicine.  She had to pay the rest out of pocket.

## 2016-05-11 ENCOUNTER — Telehealth (INDEPENDENT_AMBULATORY_CARE_PROVIDER_SITE_OTHER): Payer: Self-pay | Admitting: Specialist

## 2016-05-11 NOTE — Telephone Encounter (Signed)
Patient called this morning regarding her prescription that medicaid will not pay for.  She is in a lot of pain and needs her medication.  She would like a call back at [phone].  Thank you.

## 2016-05-11 NOTE — Telephone Encounter (Signed)
Duplicate message--- I will have to call medicaid

## 2016-05-11 NOTE — Telephone Encounter (Signed)
I will have to call medicaid on this

## 2016-05-21 ENCOUNTER — Telehealth (INDEPENDENT_AMBULATORY_CARE_PROVIDER_SITE_OTHER): Payer: Self-pay | Admitting: Specialist

## 2016-05-21 NOTE — Telephone Encounter (Signed)
Patient called wanting to know if her RX for oxycodone was approved. CB # [phone]

## 2016-05-21 NOTE — Telephone Encounter (Signed)
Dr. Otelia SergeantNitka has message in chart-patient is aware.

## 2016-05-21 NOTE — Telephone Encounter (Signed)
[name] needs a refill on her pain meds.

## 2016-05-22 ENCOUNTER — Other Ambulatory Visit (INDEPENDENT_AMBULATORY_CARE_PROVIDER_SITE_OTHER): Payer: Self-pay | Admitting: Specialist

## 2016-05-22 MED ORDER — HYDROCODONE-ACETAMINOPHEN 5-325 MG PO TABS: 1.0000 | ORAL_TABLET | Freq: Four times a day (QID) | ORAL | 0 refills | Status: DC | PRN

## 2016-05-22 NOTE — Telephone Encounter (Signed)
Surgery was 1/9 now 6 weeks post op, recommend decreasing and stopping the use of narcotics, no more oxycodone, change to hydrocodone and no further refills

## 2016-05-22 NOTE — Progress Notes (Signed)
Patient is aware that rx is ready for pick up at the front desk. 

## 2016-05-22 NOTE — Telephone Encounter (Signed)
Patient is aware that rx is ready for pick up at the front desk. 

## 2016-05-24 ENCOUNTER — Encounter (INDEPENDENT_AMBULATORY_CARE_PROVIDER_SITE_OTHER): Payer: Self-pay

## 2016-05-24 ENCOUNTER — Encounter (INDEPENDENT_AMBULATORY_CARE_PROVIDER_SITE_OTHER): Payer: Self-pay | Admitting: Specialist

## 2016-05-24 ENCOUNTER — Ambulatory Visit (INDEPENDENT_AMBULATORY_CARE_PROVIDER_SITE_OTHER): Payer: Medicaid Other | Admitting: Specialist

## 2016-05-24 VITALS — BP 108/65 | HR 86 | Ht 60.0 in | Wt 227.0 lb

## 2016-05-24 DIAGNOSIS — Z9889 Other specified postprocedural states: Secondary | ICD-10-CM

## 2016-05-24 MED ORDER — IBUPROFEN 800 MG PO TABS: 800.0000 mg | ORAL_TABLET | Freq: Three times a day (TID) | ORAL | 3 refills | Status: DC | PRN

## 2016-05-24 NOTE — Progress Notes (Signed)
Office Visit Note   Patient: Yvonne [name]           Date of Birth: 13-Feb-1986           MRN: [id number] Visit Date: 05/24/2016              Requested by: [facility] [address] [place], [place] [address] PCP: [facility]   Assessment & Plan: Visit Diagnoses:  1. Status post lumbar laminectomy     Plan: Avoid frequent bending and stooping  No lifting greater than 10 lbs. May use ice or moist heat for pain. Weight loss is of benefit. Handicap license is approved.    Follow-Up Instructions: Return in about 4 weeks (around 06/21/2016).   Orders:  No orders of the defined types were placed in this encounter.  No orders of the defined types were placed in this encounter.     Procedures: No procedures performed   Clinical Data: No additional findings.   Subjective: Chief Complaint  Patient presents with  . Lower Back - Routine Post Op    Yvonne [name] is 6 weeks 2 days postop from right L4-5 Microdiscectomy.  She states that her back is still bothering her.  She walked from the buss stop to the office and her back is really hurting her.  She quit her job because she was not able to stand that long without a break, she was told that they didn't give breaks.    Review of Systems   Objective: Vital Signs: BP 108/65 (BP Location: Left Arm, Patient Position: Sitting)   Pulse 86   Ht 5' (1.524 m)   Wt 227 lb (103 kg)   LMP 07/25/2012   BMI 44.33 kg/m   Physical Exam  Ortho Exam  Specialty Comments:  No specialty comments available.  Imaging: No results found.   PMFS History: Patient Active Problem List   Diagnosis Date Noted  . HNP (herniated nucleus pulposus), lumbar 11/05/2014    Priority: High    Class: Chronic  . Herniated nucleus pulposus, lumbar 04/10/2016  . S/P endometrial ablation - HTA 2012 06/03/2012  . Sterilization - h/o BTL 06/03/2012  . OBESITY 09/03/2007  . MIGRAINE HEADACHE 09/03/2007   Past  Medical History:  Diagnosis Date  . Anemia   . Anxiety   . 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 Asthma    albuterol inhaler  . Blood transfusion    2008 with c/s 4 units transfused  . Carpal tunnel syndrome on right   . Chronic back pain   . Depression   . Dyspnea    exertion  . Headache(784.0)    otc ibuprofen 800mg   . Heart murmur    as a child  . Obesity   . Pneumonia    as a child    Family History  Problem Relation Age of Onset  . Diabetes Mother   . Diabetes Maternal Grandmother   . Stroke Father     Past Surgical History:  Procedure Laterality Date  . ABDOMINAL HYSTERECTOMY N/A 09/29/2012   Procedure: HYSTERECTOMY ABDOMINAL;  Surgeon: Purcell Nails, MD;  Location: [facility];  Service: Gynecology;  Laterality: N/A;  . APPENDECTOMY    . BILATERAL SALPINGECTOMY Bilateral 09/29/2012   Procedure: BILATERAL SALPINGECTOMY;  Surgeon: Purcell Nails, MD;  Location: [facility];  Service: Gynecology;  Laterality: Bilateral;  . CESAREAN SECTION    .  CHOLECYSTECTOMY    . CYSTOSCOPY Bilateral 09/29/2012   Procedure: CYSTOSCOPY;  Surgeon: Purcell NailsAngela Y Roberts, MD;  Location: [facility];  Service: Gynecology;  Laterality: Bilateral;  . ENDOMETRIAL ABLATION    . EXAMINATION UNDER ANESTHESIA N/A 10/16/2012   Procedure: EXAM UNDER ANESTHESIA;  Surgeon: Purcell NailsAngela Y Roberts, MD;  Location: [facility];  Service: Gynecology;  Laterality: N/A;  Suturing Vaginal Cuff  . HAND SURGERY    . LAPAROSCOPIC ASSISTED VAGINAL HYSTERECTOMY N/A 09/29/2012   Procedure: LAPAROSCOPIC ASSISTED VAGINAL HYSTERECTOMY;  Surgeon: Purcell NailsAngela Y Roberts, MD;  Location: [facility];  Service: Gynecology;  Laterality: N/A;  . LAPAROSCOPIC HYSTERECTOMY Bilateral 09/29/2012   Procedure: HYSTERECTOMY TOTAL LAPAROSCOPIC;  Surgeon: Purcell NailsAngela Y Roberts, MD;  Location: [facility];  Service: Gynecology;  Laterality: Bilateral;  Total Laparoscopic Hysterectomy, Bilateral Salpingectomies, Cystoscopy poss. LAVH poss. TAH  .  LAPAROSCOPY    . LUMBAR LAMINECTOMY N/A 11/05/2014   Procedure: RIGHT L4-5 MICRODISCECTOMY ;  Surgeon: Kerrin ChampagneJames E Nitka, MD;  Location: [facility];  Service: Orthopedics;  Laterality: N/A;  . LUMBAR LAMINECTOMY/DECOMPRESSION MICRODISCECTOMY N/A 04/10/2016   Procedure: Right L4-5 Microdiscectomy;  Surgeon: Kerrin ChampagneJames E Nitka, MD;  Location: [facility];  Service: Orthopedics;  Laterality: N/A;  . TONSILLECTOMY     T & A  . TUBAL LIGATION     Social History   Occupational History  . Not on file.   Social History Main Topics  . Smoking status: Current Every Day Smoker    Packs/day: 0.00    Types: Cigarettes  . Smokeless tobacco: Never Used     Comment: 1-2 cigarettes a day  . Alcohol use 0.6 oz/week    1 Glasses of wine per week     Comment: ocassionally  . Drug use: No  . Sexual activity: Yes    Birth control/ protection: Surgical, Condom     Comment: surgical tubal

## 2016-05-24 NOTE — Patient Instructions (Signed)
Avoid frequent bending and stooping  No lifting greater than 10 lbs. May use ice or moist heat for pain. Weight loss is of benefit. Handicap license is approved.   

## 2016-06-12 ENCOUNTER — Telehealth (INDEPENDENT_AMBULATORY_CARE_PROVIDER_SITE_OTHER): Payer: Self-pay | Admitting: *Deleted

## 2016-06-12 NOTE — Telephone Encounter (Signed)
Patient called in this morning needing a prescription refill on her Hydrocodone please

## 2016-06-12 NOTE — Telephone Encounter (Signed)
Patient called in this morning needing a prescription refill on her Hydrocodone please. Her CB # [phone]. Thank you.

## 2016-06-13 ENCOUNTER — Other Ambulatory Visit (INDEPENDENT_AMBULATORY_CARE_PROVIDER_SITE_OTHER): Payer: Self-pay | Admitting: Specialist

## 2016-06-13 NOTE — Telephone Encounter (Signed)
This patient is now over 2 month following her lumbar laminectomy surgery. I will not refill further narcotics at this point. I recommend use of ice, tylenol and or motrin.

## 2016-06-15 NOTE — Telephone Encounter (Signed)
Patient is aware 

## 2016-06-21 ENCOUNTER — Encounter (HOSPITAL_COMMUNITY): Payer: Self-pay

## 2016-06-21 ENCOUNTER — Emergency Department (HOSPITAL_COMMUNITY)
Admission: EM | Admit: 2016-06-21 | Discharge: 2016-06-21 | Disposition: A | Discharge status: Home / Self Care | Payer: Medicaid Other | Attending: Emergency Medicine | Admitting: Emergency Medicine

## 2016-06-21 DIAGNOSIS — F17210 Nicotine dependence, cigarettes, uncomplicated: Secondary | ICD-10-CM | POA: Insufficient documentation

## 2016-06-21 DIAGNOSIS — D649 Anemia, unspecified: Secondary | ICD-10-CM | POA: Diagnosis present

## 2016-06-21 DIAGNOSIS — D508 Other iron deficiency anemias: Secondary | ICD-10-CM | POA: Diagnosis not present

## 2016-06-21 DIAGNOSIS — Z79899 Other long term (current) drug therapy: Secondary | ICD-10-CM | POA: Insufficient documentation

## 2016-06-21 DIAGNOSIS — J45909 Unspecified asthma, uncomplicated: Secondary | ICD-10-CM | POA: Diagnosis not present

## 2016-06-21 LAB — CBC WITH DIFFERENTIAL/PLATELET
Basophils Absolute: 0 10*3/uL (ref 0.0–0.1)
Basophils Relative: 0 %
EOS PCT: 2 %
Eosinophils Absolute: 0.2 10*3/uL (ref 0.0–0.7)
HCT: 36.4 % (ref 36.0–46.0)
Hemoglobin: 11.8 g/dL — ABNORMAL LOW (ref 12.0–15.0)
LYMPHS PCT: 25 %
Lymphs Abs: 2.3 10*3/uL (ref 0.7–4.0)
MCH: 27.4 pg (ref 26.0–34.0)
MCHC: 32.4 g/dL (ref 30.0–36.0)
MCV: 84.7 fL (ref 78.0–100.0)
MONO ABS: 0.5 10*3/uL (ref 0.1–1.0)
Monocytes Relative: 5 %
Neutro Abs: 6.3 10*3/uL (ref 1.7–7.7)
Neutrophils Relative %: 68 %
PLATELETS: 348 10*3/uL (ref 150–400)
RBC: 4.3 MIL/uL (ref 3.87–5.11)
RDW: 13.4 % (ref 11.5–15.5)
WBC: 9.3 10*3/uL (ref 4.0–10.5)

## 2016-06-21 LAB — BASIC METABOLIC PANEL
ANION GAP: 6 (ref 5–15)
BUN: 6 mg/dL (ref 6–20)
CALCIUM: 8.9 mg/dL (ref 8.9–10.3)
CO2: 27 mmol/L (ref 22–32)
Chloride: 107 mmol/L (ref 101–111)
Creatinine, Ser: 0.98 mg/dL (ref 0.44–1.00)
GFR calc Af Amer: >60 mL/min (ref >60)
GLUCOSE: 99 mg/dL (ref 65–99)
Potassium: 4 mmol/L (ref 3.5–5.1)
Sodium: 140 mmol/L (ref 135–145)

## 2016-06-21 MED ORDER — FERROUS SULFATE 325 (65 FE) MG PO TABS: 325.0000 mg | ORAL_TABLET | Freq: Every day | ORAL | 0 refills | Status: DC

## 2016-06-21 NOTE — Discharge Instructions (Signed)
Take your medications as prescribed. I recommend following up with your primary care provider within the next week for follow-up evaluation in having your blood work rechecked. Your hemoglobin was 11.8 in the emergency department today. Please return to the Emergency Department if symptoms worsen or new onset of fever, lightheadedness, headache, shortness of breath, chest pain, abdominal pain, vomiting, numbness, weakness, syncope, bleeding.

## 2016-06-21 NOTE — ED Triage Notes (Addendum)
Per Pt, Pt is coming from home with complaints of low iron noted for the last week. Pt reports giving plasma and having her iron checked. Complains that it has been low for the past week and she has been taking iron pills with no relief. Denies blood in urine or stool, or vaginal bleeding. Denies fatigue or symptoms. Pt denies any dizziness or lightheadedness

## 2016-06-21 NOTE — ED Provider Notes (Signed)
[facility] Provider Note   CSN: [id number] Arrival date & time: 06/21/16  0807     History   Chief Complaint Chief Complaint  Patient presents with  . Anemia    HPI Yvonne [name] is a 31 y.o. female.  HPI   Patient is a 31 year old female with history of anemia, asthma, depression who presents to the ED with complaint of low iron. Patient reports yesterday she went to give plasma and was reported to have low iron when they checked her blood. Patient states when she returned to the clinic this morning her iron continued to remain low at 35. Patient states she started taking over-the-counter iron supplements yesterday and reports taking a total of 4 doses. He reports having history of low iron in the past. Patient notes over the past 6 months she has been cold more frequently and reports mild fatigue. Patient denies fever, lightheadedness, dizziness, cough, shortness of breath, chest pain, palpitations, abdominal pain, nausea, vomiting, diarrhea, urinary sxs, numbness, tingling, weakness, syncope. Denies hemoptysis, hematemesis, hematochezia, hematuria. Patient reports history of blood transfusion but notes it was performed 9 years ago due to losing a large amount of blood during her C-section.  Past Medical History:  Diagnosis Date  . Anemia   . Anxiety   . 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 Asthma    albuterol inhaler  . Blood transfusion    2008 with c/s 4 units transfused  . Carpal tunnel syndrome on right   . Chronic back pain   . Depression   . Dyspnea    exertion  . Headache(784.0)    otc ibuprofen 800mg   . Heart murmur    as a child  . Obesity   . Pneumonia    as a child    Patient Active Problem List   Diagnosis Date Noted  . Herniated nucleus pulposus, lumbar 04/10/2016  . HNP (herniated nucleus pulposus), lumbar 11/05/2014    Class: Chronic  . S/P endometrial ablation - HTA 2012 06/03/2012  . Sterilization -  h/o BTL 06/03/2012  . OBESITY 09/03/2007  . MIGRAINE HEADACHE 09/03/2007    Past Surgical History:  Procedure Laterality Date  . ABDOMINAL HYSTERECTOMY N/A 09/29/2012   Procedure: HYSTERECTOMY ABDOMINAL;  Surgeon: Purcell Nails, MD;  Location: [facility];  Service: Gynecology;  Laterality: N/A;  . APPENDECTOMY    . BILATERAL SALPINGECTOMY Bilateral 09/29/2012   Procedure: BILATERAL SALPINGECTOMY;  Surgeon: Purcell Nails, MD;  Location: [facility];  Service: Gynecology;  Laterality: Bilateral;  . CESAREAN SECTION    . CHOLECYSTECTOMY    . CYSTOSCOPY Bilateral 09/29/2012   Procedure: CYSTOSCOPY;  Surgeon: Purcell Nails, MD;  Location: [facility];  Service: Gynecology;  Laterality: Bilateral;  . ENDOMETRIAL ABLATION    . EXAMINATION UNDER ANESTHESIA N/A 10/16/2012   Procedure: EXAM UNDER ANESTHESIA;  Surgeon: Purcell Nails, MD;  Location: [facility];  Service: Gynecology;  Laterality: N/A;  Suturing Vaginal Cuff  . HAND SURGERY    . LAPAROSCOPIC ASSISTED VAGINAL HYSTERECTOMY N/A 09/29/2012   Procedure: LAPAROSCOPIC ASSISTED VAGINAL HYSTERECTOMY;  Surgeon: Purcell Nails, MD;  Location: [facility];  Service: Gynecology;  Laterality: N/A;  . LAPAROSCOPIC HYSTERECTOMY Bilateral 09/29/2012   Procedure: HYSTERECTOMY TOTAL LAPAROSCOPIC;  Surgeon: Purcell Nails, MD;  Location: [facility];  Service: Gynecology;  Laterality: Bilateral;  Total Laparoscopic Hysterectomy, Bilateral Salpingectomies, Cystoscopy poss. LAVH poss. TAH  . LAPAROSCOPY    .  LUMBAR LAMINECTOMY N/A 11/05/2014   Procedure: RIGHT L4-5 MICRODISCECTOMY ;  Surgeon: Kerrin Champagne, MD;  Location: [facility];  Service: Orthopedics;  Laterality: N/A;  . LUMBAR LAMINECTOMY/DECOMPRESSION MICRODISCECTOMY N/A 04/10/2016   Procedure: Right L4-5 Microdiscectomy;  Surgeon: Kerrin Champagne, MD;  Location: [facility];  Service: Orthopedics;  Laterality: N/A;  . TONSILLECTOMY     T & A  . TUBAL LIGATION      OB History    Gravida Para Term Preterm AB Living   4 4 4      1    SAB TAB Ectopic Multiple Live Births           1       Home Medications    Prior to Admission medications   Medication Sig Start Date End Date Taking? Authorizing Provider  docusate sodium (COLACE) 100 MG capsule Take 1 capsule (100 mg total) by mouth 2 (two) times daily. 04/11/16   Kerrin Champagne, MD  ferrous sulfate 325 (65 FE) MG tablet Take 1 tablet (325 mg total) by mouth daily. Take one table once daily after a meal 06/21/16   Barrett Henle, PA-C  HYDROcodone-acetaminophen (NORCO/VICODIN) 5-325 MG tablet Take 1 tablet by mouth every 6 (six) hours as needed for moderate pain. 05/22/16   Kerrin Champagne, MD  ibuprofen (ADVIL,MOTRIN) 800 MG tablet Take 1 tablet (800 mg total) by mouth every 8 (eight) hours as needed. 05/24/16   Kerrin Champagne, MD  methocarbamol (ROBAXIN) 500 MG tablet Take 1 tablet (500 mg total) by mouth every 8 (eight) hours as needed for muscle spasms. 04/10/16   Kerrin Champagne, MD  oxyCODONE-acetaminophen (PERCOCET/ROXICET) 5-325 MG tablet Take 1-2 tablets by mouth every 4 (four) hours as needed for moderate pain. 04/10/16   Kerrin Champagne, MD  oxyCODONE-acetaminophen (ROXICET) 5-325 MG tablet Take 1 tablet by mouth every 6 (six) hours as needed for severe pain. 04/16/16   Naida Sleight, PA-C  QUEtiapine (SEROQUEL) 300 MG tablet Take 300 mg by mouth at bedtime.    Historical Provider, MD    Family History Family History  Problem Relation Age of Onset  . Diabetes Mother   . Diabetes Maternal Grandmother   . Stroke Father     Social History Social History  Substance Use Topics  . Smoking status: Current Every Day Smoker    Packs/day: 0.00    Types: Cigarettes  . Smokeless tobacco: Never Used     Comment: 1-2 cigarettes a day  . Alcohol use 0.6 oz/week    1 Glasses of wine per week     Comment: ocassionally     Allergies   Bee venom; Reglan [metoclopramide hcl]; and Aspirin   Review of Systems Review of Systems  Constitutional: Positive for chills  and fatigue.  All other systems reviewed and are negative.    Physical Exam Updated Vital Signs BP 115/65 (BP Location: Right Arm)   Pulse 97   Temp 97.6 F (36.4 C) (Oral)   Resp 16   Ht 5' (1.524 m)   Wt 101.6 kg   LMP 07/25/2012   SpO2 100%   BMI 43.75 kg/m   Physical Exam  Constitutional: She is oriented to person, place, and time. She appears well-developed and well-nourished. No distress.  HENT:  Head: Normocephalic and atraumatic.  Mouth/Throat: Uvula is midline, oropharynx is clear and moist and mucous membranes are normal. No oropharyngeal exudate, posterior oropharyngeal edema, posterior oropharyngeal erythema or tonsillar abscesses. No tonsillar  exudate.  Eyes: Conjunctivae and EOM are normal. Pupils are equal, round, and reactive to light. Right eye exhibits no discharge. Left eye exhibits no discharge. No scleral icterus.  Neck: Normal range of motion. Neck supple.  Cardiovascular: Normal rate, regular rhythm, normal heart sounds and intact distal pulses.   Pulmonary/Chest: Effort normal and breath sounds normal. No respiratory distress. She has no wheezes. She has no rales. She exhibits no tenderness.  Abdominal: Soft. Bowel sounds are normal. She exhibits no distension and no mass. There is no tenderness. There is no rebound and no guarding. No hernia.  Musculoskeletal: Normal range of motion. She exhibits no edema.  Neurological: She is alert and oriented to person, place, and time. She has normal strength. No sensory deficit.  Skin: Skin is warm and dry. She is not diaphoretic.  Nursing note and vitals reviewed.    ED Treatments / Results  Labs (all labs ordered are listed, but only abnormal results are displayed) Labs Reviewed  CBC WITH DIFFERENTIAL/PLATELET - Abnormal; Notable for the following:       Result Value   Hemoglobin 11.8 (*)    All other components within normal limits  BASIC METABOLIC PANEL    EKG  EKG Interpretation None        Radiology No results found.  Procedures Procedures (including critical care time)  Medications Ordered in ED Medications - No data to display   Initial Impression / Assessment and Plan / ED Course  I have reviewed the triage vital signs and the nursing notes.  Pertinent labs & imaging results that were available during my care of the patient were reviewed by me and considered in my medical decision making (see chart for details).     Pt presents with reported low iron. Reports hx of IDA. Endorses chills and mild fatigue for the past 6 months. Patient states she is supposed to take iron supplements but notes she just recently started taking them yesterday. Denies fever, CP, SOB. VSS. Exam unremarkable. Labs revealed hemoglobin 11.8. Chart review shows patient's hemoglobin typically trends 10.8-11.9. Patient has remained hemodynamically stable in the ED. Plan to discharge patient home with prescription for iron supplements. Discussed results and plan for discharge with patient. Advised patient to follow up with PCP in one week for follow-up evaluation and repeat blood work. Discussed return precautions.  Final Clinical Impressions(s) / ED Diagnoses   Final diagnoses:  Other iron deficiency anemia    New Prescriptions New Prescriptions   FERROUS SULFATE 325 (65 FE) MG TABLET    Take 1 tablet (325 mg total) by mouth daily. Take one table once daily after a meal     Barrett Henleicole Elizabeth Arika Mainer, PA-C 06/21/16 1027    Tilden FossaElizabeth Rees, MD 06/22/16 [phone]

## 2016-07-05 ENCOUNTER — Ambulatory Visit (INDEPENDENT_AMBULATORY_CARE_PROVIDER_SITE_OTHER): Payer: Medicaid Other | Admitting: Specialist

## 2016-08-06 ENCOUNTER — Telehealth (INDEPENDENT_AMBULATORY_CARE_PROVIDER_SITE_OTHER): Payer: Self-pay | Admitting: Specialist

## 2016-08-06 NOTE — Telephone Encounter (Signed)
Patient states she trying to get assistance from [employer] to pay her Gas bill & she is needing a note from Dr Otelia SergeantNitka that states she was not able to work in January per Dr Otelia SergeantNitka.

## 2016-08-06 NOTE — Telephone Encounter (Signed)
Patient states she trying to get assistance from [employer] to pay her Gas bill & she is needing a note from Dr Nitka that states she was not able to work in January per Dr Nitka. 

## 2016-08-10 ENCOUNTER — Encounter (INDEPENDENT_AMBULATORY_CARE_PROVIDER_SITE_OTHER): Payer: Self-pay | Admitting: Specialist

## 2016-08-10 NOTE — Telephone Encounter (Signed)
Pt aware it is ready for pick up at the front desk

## 2016-08-10 NOTE — Telephone Encounter (Signed)
Done, letter written and signed. jen

## 2016-08-21 ENCOUNTER — Emergency Department (HOSPITAL_COMMUNITY)
Admission: EM | Admit: 2016-08-21 | Discharge: 2016-08-21 | Discharge status: Against Medical Advice | Payer: Medicaid Other

## 2016-08-21 ENCOUNTER — Other Ambulatory Visit: Payer: Self-pay

## 2016-08-21 NOTE — ED Notes (Signed)
Pt reports needing to leave ED to go to job interview. Pt educated that ED is always open if she wants to come back.

## 2016-09-03 NOTE — Addendum Note (Signed)
Addendum  created 09/03/16 0958 by Clarice Bonaventure, MD   Sign clinical note    

## 2016-10-15 ENCOUNTER — Other Ambulatory Visit: Payer: Self-pay | Admitting: Neurological Surgery

## 2016-10-15 DIAGNOSIS — M961 Postlaminectomy syndrome, not elsewhere classified: Secondary | ICD-10-CM

## 2016-10-27 ENCOUNTER — Other Ambulatory Visit: Payer: Medicaid Other
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
## 2016-12-23 ENCOUNTER — Other Ambulatory Visit (INDEPENDENT_AMBULATORY_CARE_PROVIDER_SITE_OTHER): Payer: Self-pay | Admitting: Specialist

## 2016-12-24 NOTE — Telephone Encounter (Signed)
Ibuprofen refill request.

## 2017-01-07 ENCOUNTER — Other Ambulatory Visit: Payer: Self-pay | Admitting: Surgical Oncology

## 2017-01-08 ENCOUNTER — Ambulatory Visit
Admission: RE | Admit: 2017-01-08 | Discharge: 2017-01-08 | Disposition: A | Discharge status: Home / Self Care | Payer: Medicaid Other | Source: Ambulatory Visit | Attending: Neurological Surgery | Admitting: Neurological Surgery

## 2017-01-08 DIAGNOSIS — M961 Postlaminectomy syndrome, not elsewhere classified: Secondary | ICD-10-CM

## 2017-01-08 MED ORDER — GADOBENATE DIMEGLUMINE 529 MG/ML IV SOLN
20.0000 mL | Freq: Once | INTRAVENOUS | Status: DC | PRN
Start: 2017-01-08 — End: 2017-01-09

## 2017-01-14 ENCOUNTER — Other Ambulatory Visit: Payer: Self-pay | Admitting: Neurological Surgery

## 2017-02-08 NOTE — Pre-Procedure Instructions (Signed)
Yvonne [name]  02/08/2017      [facility] [phone] [place] - [address] [facility] [address] [facility] [place] [place] [id number] Phone: [phone] Fax: [phone]    Your procedure is scheduled on February 13, 2017.  [facility] at [facility] AM.  Call this number if you have problems the morning of surgery:  [phone]   Remember:  Do not eat food or drink liquids after midnight.  Take these medicines the morning of surgery with A SIP OF WATER oxycodone-acetaminophen (roxicet)-if needed for pain.   7 days prior to surgery STOP taking any Aspirin (unless otherwise instructed by your surgeon), Aleve, Naproxen, Ibuprofen, Motrin, Advil, Goody's, BC's, all herbal medications, fish oil, and all vitamins  Continue all other medications as instructed by your physician except follow the above medication instructions before surgery   Do not wear jewelry, make-up or nail polish.  Do not wear lotions, powders, or perfumes, or deoderant.  Do not shave 48 hours prior to surgery.    Do not bring valuables to the hospital.  [facility] is not responsible for any belongings or valuables.  Contacts, dentures or bridgework may not be worn into surgery.  Leave your suitcase in the car.  After surgery it may be brought to your room.  For patients admitted to the hospital, discharge time will be determined by your treatment team.  Patients discharged the day of surgery will not be allowed to drive home.   Special instructions:   [place]- Preparing For Surgery  Before surgery, you can play an important role. Because skin is not sterile, your skin needs to be as free of germs as possible. You can reduce the number of germs on your skin by washing with CHG (chlorahexidine gluconate) Soap before surgery.  CHG is an antiseptic cleaner which kills germs and bonds with the skin to continue killing germs even after  washing.  Please do not use if you have an allergy to CHG or antibacterial soaps. If your skin becomes reddened/irritated stop using the CHG.  Do not shave (including legs and underarms) for at least 48 hours prior to first CHG shower. It is OK to shave your face.  Please follow these instructions carefully.   1. Shower the NIGHT BEFORE SURGERY and the MORNING OF SURGERY with CHG.   2. If you chose to wash your hair, wash your hair first as usual with your normal shampoo.  3. After you shampoo, rinse your hair and body thoroughly to remove the shampoo.  4. Use CHG as you would any other liquid soap. You can apply CHG directly to the skin and wash gently with a scrungie or a clean washcloth.   5. Apply the CHG Soap to your body ONLY FROM THE NECK DOWN.  Do not use on open wounds or open sores. Avoid contact with your eyes, ears, mouth and genitals (private parts). Wash Face and genitals (private parts)  with your normal soap.  6. Wash thoroughly, paying special attention to the area where your surgery will be performed.  7. Thoroughly rinse your body with warm water from the neck down.  8. DO NOT shower/wash with your normal soap after using and rinsing off the CHG Soap.  9. Pat yourself dry with a CLEAN TOWEL.  10. Wear CLEAN PAJAMAS to bed the night before surgery, wear comfortable clothes the morning of surgery  11. Place CLEAN SHEETS on your bed the night of your first shower and DO NOT SLEEP WITH PETS.    Day of Surgery: Do not apply any deodorants/lotions. Please wear clean clothes to the hospital/surgery center.     Please read over the following fact sheets that you were given. Pain Booklet, Coughing and Deep Breathing, MRSA Information and Surgical Site Infection Prevention

## 2017-02-11 ENCOUNTER — Encounter (HOSPITAL_COMMUNITY): Payer: Self-pay

## 2017-02-11 ENCOUNTER — Ambulatory Visit (HOSPITAL_COMMUNITY)
Admission: RE | Admit: 2017-02-11 | Discharge: 2017-02-11 | Disposition: A | Discharge status: Home / Self Care | Payer: Medicaid Other | Source: Ambulatory Visit | Attending: Neurological Surgery | Admitting: Neurological Surgery

## 2017-02-11 ENCOUNTER — Other Ambulatory Visit: Payer: Self-pay

## 2017-02-11 ENCOUNTER — Encounter (HOSPITAL_COMMUNITY)
Admission: RE | Admit: 2017-02-11 | Discharge: 2017-02-11 | Disposition: A | Discharge status: Home / Self Care | Payer: Medicaid Other | Source: Ambulatory Visit | Attending: Neurological Surgery | Admitting: Neurological Surgery

## 2017-02-11 DIAGNOSIS — Z01818 Encounter for other preprocedural examination: Secondary | ICD-10-CM | POA: Insufficient documentation

## 2017-02-11 DIAGNOSIS — M961 Postlaminectomy syndrome, not elsewhere classified: Secondary | ICD-10-CM | POA: Insufficient documentation

## 2017-02-11 LAB — CBC WITH DIFFERENTIAL/PLATELET
BASOS ABS: 0 10*3/uL (ref 0.0–0.1)
BASOS PCT: 0 %
EOS ABS: 0.1 10*3/uL (ref 0.0–0.7)
Eosinophils Relative: 1 %
HCT: 36.4 % (ref 36.0–46.0)
HEMOGLOBIN: 11.9 g/dL — AB (ref 12.0–15.0)
LYMPHS ABS: 2 10*3/uL (ref 0.7–4.0)
Lymphocytes Relative: 16 %
MCH: 27.8 pg (ref 26.0–34.0)
MCHC: 32.7 g/dL (ref 30.0–36.0)
MCV: 85 fL (ref 78.0–100.0)
Monocytes Absolute: 0.6 10*3/uL (ref 0.1–1.0)
Monocytes Relative: 5 %
NEUTROS PCT: 78 %
Neutro Abs: 9.6 10*3/uL — ABNORMAL HIGH (ref 1.7–7.7)
Platelets: 339 10*3/uL (ref 150–400)
RBC: 4.28 MIL/uL (ref 3.87–5.11)
RDW: 13.4 % (ref 11.5–15.5)
WBC: 12.3 10*3/uL — AB (ref 4.0–10.5)

## 2017-02-11 LAB — BASIC METABOLIC PANEL
Anion gap: 8 (ref 5–15)
BUN: 6 mg/dL (ref 6–20)
CALCIUM: 9.2 mg/dL (ref 8.9–10.3)
CHLORIDE: 103 mmol/L (ref 101–111)
CO2: 26 mmol/L (ref 22–32)
CREATININE: 0.85 mg/dL (ref 0.44–1.00)
GFR calc non Af Amer: >60 mL/min (ref >60)
Glucose, Bld: 110 mg/dL — ABNORMAL HIGH (ref 65–99)
Potassium: 4 mmol/L (ref 3.5–5.1)
SODIUM: 137 mmol/L (ref 135–145)

## 2017-02-11 LAB — TYPE AND SCREEN
ABO/RH(D): A POS
Antibody Screen: NEGATIVE

## 2017-02-11 LAB — PROTIME-INR
INR: 0.99
Prothrombin Time: 13 seconds (ref 11.4–15.2)

## 2017-02-11 LAB — ABO/RH: ABO/RH(D): A POS

## 2017-02-11 LAB — SURGICAL PCR SCREEN
MRSA, PCR: NEGATIVE
STAPHYLOCOCCUS AUREUS: NEGATIVE

## 2017-02-11 NOTE — Pre-Procedure Instructions (Addendum)
Annye EnglishStephanie C Schlechter  02/11/2017      [facility] [phone] [place] - [address] [facility] [address] [facility] [place]  [place] [phone] Phone: [phone] Fax: [phone]    Your procedure is scheduled on February 13, 2017.  [facility] at 1045 AM.  Call this number if you have problems the morning of surgery:  [phone]   Remember:  Do not eat food or drink liquids after midnight.  Take these medicines the morning of surgery with A SIP OF WATER tylenol if needed  Starting today 02/11/17 STOP taking any Aspirin (unless otherwise instructed by your surgeon), Aleve, Naproxen, Ibuprofen, Motrin, Advil, Goody's, BC's, all herbal medications, fish oil, and all vitamins  Continue all other medications as instructed by your physician except follow the above medication instructions before surgery   Do not wear jewelry, make-up or nail polish.  Do not wear lotions, powders, or perfumes, or deoderant.  Do not shave 48 hours prior to surgery.    Do not bring valuables to the hospital.  [facility] is not responsible for any belongings or valuables.  Contacts, dentures or bridgework may not be worn into surgery.  Leave your suitcase in the car.  After surgery it may be brought to your room.  For patients admitted to the hospital, discharge time will be determined by your treatment team.  Patients discharged the day of surgery will not be allowed to drive home.   Special instructions:   [place]- Preparing For Surgery  Before surgery, you can play an important role. Because skin is not sterile, your skin needs to be as free of germs as possible. You can reduce the number of germs on your skin by washing with CHG (chlorahexidine gluconate) Soap before surgery.  CHG is an antiseptic cleaner which kills germs and bonds with the skin to continue killing germs even after washing.  Please do not use if you have an  allergy to CHG or antibacterial soaps. If your skin becomes reddened/irritated stop using the CHG.  Do not shave (including legs and underarms) for at least 48 hours prior to first CHG shower. It is OK to shave your face.  Please follow these instructions carefully.   1. Shower the NIGHT BEFORE SURGERY and the MORNING OF SURGERY with CHG.   2. If you chose to wash your hair, wash your hair first as usual with your normal shampoo.  3. After you shampoo, rinse your hair and body thoroughly to remove the shampoo.  4. Use CHG as you would any other liquid soap. You can apply CHG directly to the skin and wash gently with a scrungie or a clean washcloth.   5. Apply the CHG Soap to your body ONLY FROM THE NECK DOWN.  Do not use on open wounds or open sores. Avoid contact with your eyes, ears, mouth and genitals (private parts). Wash Face and genitals (private parts)  with your normal soap.  6. Wash thoroughly, paying special attention to the area where your surgery will be performed.  7. Thoroughly rinse your body with warm water from the neck down.  8. DO NOT shower/wash with your normal soap after using and rinsing off the CHG Soap.  9. Pat yourself dry with a CLEAN TOWEL.  10. Wear CLEAN PAJAMAS to bed the night before surgery, wear comfortable clothes the morning of surgery  11. Place CLEAN SHEETS on  your bed the night of your first shower and DO NOT SLEEP WITH PETS.    Day of Surgery: Do not apply any deodorants/lotions. Please wear clean clothes to the hospital/surgery center.     Please read over the  fact sheets that you were given.

## 2017-02-13 ENCOUNTER — Inpatient Hospital Stay (HOSPITAL_COMMUNITY): Payer: Medicaid Other | Admitting: Anesthesiology

## 2017-02-13 ENCOUNTER — Inpatient Hospital Stay (HOSPITAL_COMMUNITY): Payer: Medicaid Other

## 2017-02-13 ENCOUNTER — Inpatient Hospital Stay (HOSPITAL_COMMUNITY)
Admission: RE | Admit: 2017-02-13 | Discharge: 2017-02-14 | DRG: 454 | Disposition: A | Discharge status: Home / Self Care | Payer: Medicaid Other | Source: Ambulatory Visit | Attending: Neurological Surgery | Admitting: Neurological Surgery

## 2017-02-13 ENCOUNTER — Encounter (HOSPITAL_COMMUNITY)
Admission: RE | Disposition: A | Discharge status: Home / Self Care | Payer: Self-pay | Source: Ambulatory Visit | Attending: Neurological Surgery

## 2017-02-13 ENCOUNTER — Encounter (HOSPITAL_COMMUNITY): Payer: Self-pay | Admitting: Certified Registered"

## 2017-02-13 DIAGNOSIS — Z91030 Bee allergy status: Secondary | ICD-10-CM

## 2017-02-13 DIAGNOSIS — Z888 Allergy status to other drugs, medicaments and biological substances status: Secondary | ICD-10-CM | POA: Diagnosis not present

## 2017-02-13 DIAGNOSIS — M5136 Other intervertebral disc degeneration, lumbar region: Secondary | ICD-10-CM | POA: Diagnosis present

## 2017-02-13 DIAGNOSIS — M5126 Other intervertebral disc displacement, lumbar region: Secondary | ICD-10-CM | POA: Diagnosis present

## 2017-02-13 DIAGNOSIS — E669 Obesity, unspecified: Secondary | ICD-10-CM | POA: Diagnosis present

## 2017-02-13 DIAGNOSIS — F17210 Nicotine dependence, cigarettes, uncomplicated: Secondary | ICD-10-CM | POA: Diagnosis present

## 2017-02-13 DIAGNOSIS — M479 Spondylosis, unspecified: Secondary | ICD-10-CM | POA: Diagnosis present

## 2017-02-13 DIAGNOSIS — Z886 Allergy status to analgesic agent status: Secondary | ICD-10-CM | POA: Diagnosis not present

## 2017-02-13 DIAGNOSIS — Z419 Encounter for procedure for purposes other than remedying health state, unspecified: Secondary | ICD-10-CM

## 2017-02-13 DIAGNOSIS — Z6841 Body Mass Index (BMI) 40.0 and over, adult: Secondary | ICD-10-CM | POA: Diagnosis not present

## 2017-02-13 DIAGNOSIS — Z981 Arthrodesis status: Secondary | ICD-10-CM

## 2017-02-13 HISTORY — PX: TRANSFORAMINAL LUMBAR INTERBODY FUSION (TLIF) WITH PEDICLE SCREW FIXATION 1 LEVEL: SHX6141

## 2017-02-13 SURGERY — TRANSFORAMINAL LUMBAR INTERBODY FUSION (TLIF) WITH PEDICLE SCREW FIXATION 1 LEVEL
Anesthesia: General | Site: Back

## 2017-02-13 MED ORDER — SODIUM CHLORIDE 0.9 % IV SOLN: 250.0000 mL | INTRAVENOUS | Status: DC

## 2017-02-13 MED ORDER — LIDOCAINE 2% (20 MG/ML) 5 ML SYRINGE
INTRAMUSCULAR | Status: AC
  Filled 2017-02-13: qty 5

## 2017-02-13 MED ORDER — METHOCARBAMOL 500 MG PO TABS
500.0000 mg | ORAL_TABLET | Freq: Four times a day (QID) | ORAL | Status: DC | PRN
  Administered 2017-02-13 (×2): 500 mg via ORAL
  Filled 2017-02-13: qty 1

## 2017-02-13 MED ORDER — LACTATED RINGERS IV SOLN
INTRAVENOUS | Status: DC
  Administered 2017-02-13 (×2): via INTRAVENOUS

## 2017-02-13 MED ORDER — HEMOSTATIC AGENTS (NO CHARGE) OPTIME
TOPICAL | Status: DC | PRN
  Administered 2017-02-13: 1 via TOPICAL

## 2017-02-13 MED ORDER — BUPIVACAINE HCL (PF) 0.25 % IJ SOLN
INTRAMUSCULAR | Status: DC | PRN
  Administered 2017-02-13: 4 mL

## 2017-02-13 MED ORDER — CEFAZOLIN SODIUM-DEXTROSE 2-4 GM/100ML-% IV SOLN
2.0000 g | INTRAVENOUS | Status: AC
  Administered 2017-02-13: 2 g via INTRAVENOUS
  Filled 2017-02-13: qty 100

## 2017-02-13 MED ORDER — MIDAZOLAM HCL 2 MG/2ML IJ SOLN
INTRAMUSCULAR | Status: AC
  Filled 2017-02-13: qty 2

## 2017-02-13 MED ORDER — ONDANSETRON HCL 4 MG/2ML IJ SOLN
INTRAMUSCULAR | Status: AC
  Filled 2017-02-13: qty 2

## 2017-02-13 MED ORDER — OXYCODONE HCL 5 MG/5ML PO SOLN: 5.0000 mg | Freq: Once | ORAL | Status: AC | PRN

## 2017-02-13 MED ORDER — ACETAMINOPHEN 650 MG RE SUPP: 650.0000 mg | RECTAL | Status: DC | PRN

## 2017-02-13 MED ORDER — BUPIVACAINE HCL (PF) 0.25 % IJ SOLN
INTRAMUSCULAR | Status: AC
  Filled 2017-02-13: qty 30

## 2017-02-13 MED ORDER — VANCOMYCIN HCL 1000 MG IV SOLR
INTRAVENOUS | Status: AC
  Filled 2017-02-13: qty 1000

## 2017-02-13 MED ORDER — ONDANSETRON HCL 4 MG/2ML IJ SOLN
INTRAMUSCULAR | Status: DC | PRN
  Administered 2017-02-13: 4 mg via INTRAVENOUS

## 2017-02-13 MED ORDER — MENTHOL 3 MG MT LOZG: 1.0000 | LOZENGE | OROMUCOSAL | Status: DC | PRN

## 2017-02-13 MED ORDER — SENNA 8.6 MG PO TABS
1.0000 | ORAL_TABLET | Freq: Two times a day (BID) | ORAL | Status: DC
  Administered 2017-02-13 – 2017-02-14 (×2): 8.6 mg via ORAL
  Filled 2017-02-13 (×2): qty 1

## 2017-02-13 MED ORDER — SODIUM CHLORIDE 0.9 % IR SOLN
Status: DC | PRN
  Administered 2017-02-13 (×2)

## 2017-02-13 MED ORDER — CEFAZOLIN SODIUM-DEXTROSE 2-4 GM/100ML-% IV SOLN
2.0000 g | Freq: Three times a day (TID) | INTRAVENOUS | Status: AC
  Administered 2017-02-13 – 2017-02-14 (×2): 2 g via INTRAVENOUS
  Filled 2017-02-13 (×2): qty 100

## 2017-02-13 MED ORDER — PHENOL 1.4 % MT LIQD: 1.0000 | OROMUCOSAL | Status: DC | PRN

## 2017-02-13 MED ORDER — HYDROMORPHONE HCL 1 MG/ML IJ SOLN
0.2500 mg | INTRAMUSCULAR | Status: DC | PRN
  Administered 2017-02-13 (×4): 0.5 mg via INTRAVENOUS

## 2017-02-13 MED ORDER — ACETAMINOPHEN 325 MG PO TABS: 650.0000 mg | ORAL_TABLET | ORAL | Status: DC | PRN

## 2017-02-13 MED ORDER — SUFENTANIL CITRATE 50 MCG/ML IV SOLN
INTRAVENOUS | Status: DC | PRN
  Administered 2017-02-13: 10 ug via INTRAVENOUS
  Administered 2017-02-13: 20 ug via INTRAVENOUS
  Administered 2017-02-13: 10 ug via INTRAVENOUS
  Administered 2017-02-13 (×2): 5 ug via INTRAVENOUS

## 2017-02-13 MED ORDER — OXYCODONE HCL 5 MG PO TABS
5.0000 mg | ORAL_TABLET | ORAL | Status: DC | PRN
  Administered 2017-02-13 – 2017-02-14 (×3): 5 mg via ORAL
  Filled 2017-02-13 (×3): qty 1

## 2017-02-13 MED ORDER — DEXAMETHASONE SODIUM PHOSPHATE 10 MG/ML IJ SOLN
10.0000 mg | INTRAMUSCULAR | Status: AC
  Administered 2017-02-13: 10 mg via INTRAVENOUS
  Filled 2017-02-13: qty 1

## 2017-02-13 MED ORDER — MORPHINE SULFATE (PF) 4 MG/ML IV SOLN
2.0000 mg | INTRAVENOUS | Status: DC | PRN
  Administered 2017-02-13: 2 mg via INTRAVENOUS
  Filled 2017-02-13: qty 1

## 2017-02-13 MED ORDER — THROMBIN (RECOMBINANT) 5000 UNITS EX SOLR
CUTANEOUS | Status: AC
  Filled 2017-02-13: qty 5000

## 2017-02-13 MED ORDER — SUGAMMADEX SODIUM 200 MG/2ML IV SOLN
INTRAVENOUS | Status: DC | PRN
  Administered 2017-02-13: 200 mg via INTRAVENOUS

## 2017-02-13 MED ORDER — LIDOCAINE 2% (20 MG/ML) 5 ML SYRINGE
INTRAMUSCULAR | Status: DC | PRN
  Administered 2017-02-13: 100 mg via INTRAVENOUS

## 2017-02-13 MED ORDER — VANCOMYCIN HCL 1000 MG IV SOLR
INTRAVENOUS | Status: DC | PRN
  Administered 2017-02-13: 1000 mg via TOPICAL

## 2017-02-13 MED ORDER — ROCURONIUM BROMIDE 10 MG/ML (PF) SYRINGE
PREFILLED_SYRINGE | INTRAVENOUS | Status: DC | PRN
  Administered 2017-02-13: 10 mg via INTRAVENOUS
  Administered 2017-02-13: 80 mg via INTRAVENOUS

## 2017-02-13 MED ORDER — METHOCARBAMOL 500 MG PO TABS
ORAL_TABLET | ORAL | Status: AC
  Filled 2017-02-13: qty 1

## 2017-02-13 MED ORDER — OXYCODONE HCL 5 MG PO TABS
5.0000 mg | ORAL_TABLET | Freq: Once | ORAL | Status: AC | PRN
  Administered 2017-02-13: 5 mg via ORAL

## 2017-02-13 MED ORDER — CELECOXIB 200 MG PO CAPS
200.0000 mg | ORAL_CAPSULE | Freq: Two times a day (BID) | ORAL | Status: DC
  Administered 2017-02-13 – 2017-02-14 (×2): 200 mg via ORAL
  Filled 2017-02-13 (×2): qty 1

## 2017-02-13 MED ORDER — SUGAMMADEX SODIUM 200 MG/2ML IV SOLN
INTRAVENOUS | Status: AC
  Filled 2017-02-13: qty 2

## 2017-02-13 MED ORDER — PROPOFOL 10 MG/ML IV BOLUS
INTRAVENOUS | Status: AC
  Filled 2017-02-13: qty 20

## 2017-02-13 MED ORDER — OXYCODONE HCL 5 MG PO TABS
ORAL_TABLET | ORAL | Status: AC
  Filled 2017-02-13: qty 1

## 2017-02-13 MED ORDER — ONDANSETRON HCL 4 MG/2ML IJ SOLN
4.0000 mg | Freq: Four times a day (QID) | INTRAMUSCULAR | Status: DC | PRN
  Administered 2017-02-13: 4 mg via INTRAVENOUS
  Filled 2017-02-13: qty 2

## 2017-02-13 MED ORDER — HYDROMORPHONE HCL 1 MG/ML IJ SOLN
INTRAMUSCULAR | Status: AC
  Administered 2017-02-13: 0.5 mg via INTRAVENOUS
  Filled 2017-02-13: qty 1

## 2017-02-13 MED ORDER — SUFENTANIL CITRATE 50 MCG/ML IV SOLN
INTRAVENOUS | Status: AC
  Filled 2017-02-13: qty 1

## 2017-02-13 MED ORDER — ONDANSETRON HCL 4 MG PO TABS: 4.0000 mg | ORAL_TABLET | Freq: Four times a day (QID) | ORAL | Status: DC | PRN

## 2017-02-13 MED ORDER — PROMETHAZINE HCL 25 MG/ML IJ SOLN: 6.2500 mg | INTRAMUSCULAR | Status: DC | PRN

## 2017-02-13 MED ORDER — THROMBIN (RECOMBINANT) 5000 UNITS EX SOLR
CUTANEOUS | Status: AC
  Filled 2017-02-13: qty 10000

## 2017-02-13 MED ORDER — THROMBIN (RECOMBINANT) 5000 UNITS EX SOLR
CUTANEOUS | Status: DC | PRN
  Administered 2017-02-13: 5000 [IU] via TOPICAL

## 2017-02-13 MED ORDER — POTASSIUM CHLORIDE IN NACL 20-0.9 MEQ/L-% IV SOLN
INTRAVENOUS | Status: DC
  Administered 2017-02-13: 21:00:00 via INTRAVENOUS
  Filled 2017-02-13: qty 1000

## 2017-02-13 MED ORDER — PROPOFOL 10 MG/ML IV BOLUS
INTRAVENOUS | Status: DC | PRN
  Administered 2017-02-13: 50 mg via INTRAVENOUS
  Administered 2017-02-13: 150 mg via INTRAVENOUS

## 2017-02-13 MED ORDER — MIDAZOLAM HCL 5 MG/5ML IJ SOLN
INTRAMUSCULAR | Status: DC | PRN
Start: 2017-02-13 — End: 2017-02-13
  Administered 2017-02-13: 2 mg via INTRAVENOUS

## 2017-02-13 MED ORDER — CHLORHEXIDINE GLUCONATE CLOTH 2 % EX PADS: 6.0000 | MEDICATED_PAD | Freq: Once | CUTANEOUS | Status: DC

## 2017-02-13 MED ORDER — SODIUM CHLORIDE 0.9% FLUSH
3.0000 mL | Freq: Two times a day (BID) | INTRAVENOUS | Status: DC
  Administered 2017-02-14: 3 mL via INTRAVENOUS

## 2017-02-13 MED ORDER — METHOCARBAMOL 1000 MG/10ML IJ SOLN
500.0000 mg | Freq: Four times a day (QID) | INTRAVENOUS | Status: DC | PRN
  Filled 2017-02-13: qty 5

## 2017-02-13 MED ORDER — SODIUM CHLORIDE 0.9% FLUSH: 3.0000 mL | INTRAVENOUS | Status: DC | PRN

## 2017-02-13 SURGICAL SUPPLY — 68 items
BAG DECANTER FOR FLEXI CONT (MISCELLANEOUS) ×6 IMPLANT
BASKET BONE COLLECTION (BASKET) ×3 IMPLANT
BENZOIN TINCTURE PRP APPL 2/3 (GAUZE/BANDAGES/DRESSINGS) ×3 IMPLANT
BLADE CLIPPER SURG (BLADE) IMPLANT
BUR MATCHSTICK NEURO 3.0 LAGG (BURR) ×3 IMPLANT
CAGE SPINAL PC 10X30X7 10D (Cage) ×3 IMPLANT
CANISTER SUCT 3000ML PPV (MISCELLANEOUS) ×3 IMPLANT
CARTRIDGE OIL MAESTRO DRILL (MISCELLANEOUS) ×1 IMPLANT
CLOSURE WOUND 1/2 X4 (GAUZE/BANDAGES/DRESSINGS) ×1
CONT SPEC 4OZ CLIKSEAL STRL BL (MISCELLANEOUS) ×3 IMPLANT
COVER BACK TABLE 60X90IN (DRAPES) ×3 IMPLANT
DERMABOND ADVANCED (GAUZE/BANDAGES/DRESSINGS) ×2
DERMABOND ADVANCED .7 DNX12 (GAUZE/BANDAGES/DRESSINGS) ×1 IMPLANT
DIFFUSER DRILL AIR PNEUMATIC (MISCELLANEOUS) ×3 IMPLANT
DRAPE C-ARM 42X72 X-RAY (DRAPES) ×3 IMPLANT
DRAPE C-ARMOR (DRAPES) ×3 IMPLANT
DRAPE LAPAROTOMY 100X72X124 (DRAPES) ×3 IMPLANT
DRAPE POUCH INSTRU U-SHP 10X18 (DRAPES) ×3 IMPLANT
DRAPE SURG 17X23 STRL (DRAPES) ×3 IMPLANT
DRSG OPSITE POSTOP 4X6 (GAUZE/BANDAGES/DRESSINGS) ×3 IMPLANT
DURAPREP 26ML APPLICATOR (WOUND CARE) ×3 IMPLANT
ELECT BLADE 4.0 EZ CLEAN MEGAD (MISCELLANEOUS) ×3
ELECT REM PT RETURN 9FT ADLT (ELECTROSURGICAL) ×3
ELECTRODE BLDE 4.0 EZ CLN MEGD (MISCELLANEOUS) ×1 IMPLANT
ELECTRODE REM PT RTRN 9FT ADLT (ELECTROSURGICAL) ×1 IMPLANT
EVACUATOR 1/8 PVC DRAIN (DRAIN) IMPLANT
GAUZE SPONGE 4X4 16PLY XRAY LF (GAUZE/BANDAGES/DRESSINGS) IMPLANT
GLOVE BIO SURGEON STRL SZ7 (GLOVE) ×3 IMPLANT
GLOVE BIO SURGEON STRL SZ8 (GLOVE) ×6 IMPLANT
GLOVE BIOGEL PI IND STRL 6.5 (GLOVE) ×1 IMPLANT
GLOVE BIOGEL PI IND STRL 7.0 (GLOVE) IMPLANT
GLOVE BIOGEL PI IND STRL 8 (GLOVE) ×4 IMPLANT
GLOVE BIOGEL PI INDICATOR 6.5 (GLOVE) ×2
GLOVE BIOGEL PI INDICATOR 7.0 (GLOVE)
GLOVE BIOGEL PI INDICATOR 8 (GLOVE) ×8
GLOVE ECLIPSE 7.5 STRL STRAW (GLOVE) ×12 IMPLANT
GLOVE INDICATOR 8.5 STRL (GLOVE) ×3 IMPLANT
GLOVE SURG SS PI 6.0 STRL IVOR (GLOVE) ×9 IMPLANT
GOWN STRL REUS W/ TWL LRG LVL3 (GOWN DISPOSABLE) ×2 IMPLANT
GOWN STRL REUS W/ TWL XL LVL3 (GOWN DISPOSABLE) ×3 IMPLANT
GOWN STRL REUS W/TWL 2XL LVL3 (GOWN DISPOSABLE) ×6 IMPLANT
GOWN STRL REUS W/TWL LRG LVL3 (GOWN DISPOSABLE) ×4
GOWN STRL REUS W/TWL XL LVL3 (GOWN DISPOSABLE) ×6
HEMOSTAT POWDER KIT SURGIFOAM (HEMOSTASIS) ×3 IMPLANT
KIT BASIN OR (CUSTOM PROCEDURE TRAY) ×3 IMPLANT
KIT INFUSE X SMALL 1.4CC (Orthopedic Implant) ×3 IMPLANT
KIT ROOM TURNOVER OR (KITS) ×3 IMPLANT
NEEDLE HYPO 25X1 1.5 SAFETY (NEEDLE) ×3 IMPLANT
NS IRRIG 1000ML POUR BTL (IV SOLUTION) ×3 IMPLANT
OIL CARTRIDGE MAESTRO DRILL (MISCELLANEOUS) ×3
PACK LAMINECTOMY NEURO (CUSTOM PROCEDURE TRAY) ×3 IMPLANT
PAD ARMBOARD 7.5X6 YLW CONV (MISCELLANEOUS) ×9 IMPLANT
PUTTY DBM ALLOSYNC PURE 5CC (Putty) ×3 IMPLANT
ROD PC 5.5X40 TI ARSENAL (Rod) ×6 IMPLANT
SCREW CBX 5.5X35MM (Screw) ×9 IMPLANT
SCREW CORT CBX 5.5X40 (Screw) ×3 IMPLANT
SCREW SET SPINAL ARSENAL 47127 (Screw) ×12 IMPLANT
SPONGE LAP 4X18 X RAY DECT (DISPOSABLE) IMPLANT
SPONGE SURGIFOAM ABS GEL 100 (HEMOSTASIS) IMPLANT
STRIP CLOSURE SKIN 1/2X4 (GAUZE/BANDAGES/DRESSINGS) ×2 IMPLANT
SUT VIC AB 0 CT1 18XCR BRD8 (SUTURE) ×1 IMPLANT
SUT VIC AB 0 CT1 8-18 (SUTURE) ×2
SUT VIC AB 2-0 CP2 18 (SUTURE) ×3 IMPLANT
SUT VIC AB 3-0 SH 8-18 (SUTURE) ×3 IMPLANT
TOWEL GREEN STERILE (TOWEL DISPOSABLE) ×3 IMPLANT
TOWEL GREEN STERILE FF (TOWEL DISPOSABLE) IMPLANT
TRAY FOLEY W/METER SILVER 16FR (SET/KITS/TRAYS/PACK) ×3 IMPLANT
WATER STERILE IRR 1000ML POUR (IV SOLUTION) ×3 IMPLANT

## 2017-02-13 NOTE — Anesthesia Procedure Notes (Signed)
Procedure Name: Intubation Date/Time: 02/13/2017 12:59 PM Performed by: Moshe Salisbury, CRNA Pre-anesthesia Checklist: Patient identified, Emergency Drugs available, Suction available and Patient being monitored Patient Re-evaluated:Patient Re-evaluated prior to induction Oxygen Delivery Method: Circle System Utilized Preoxygenation: Pre-oxygenation with 100% oxygen Induction Type: IV induction Ventilation: Mask ventilation without difficulty Laryngoscope Size: Mac and 3 Grade View: Grade II Tube type: Oral Tube size: 7.5 mm Number of attempts: 1 Airway Equipment and Method: Stylet Placement Confirmation: ETT inserted through vocal cords under direct vision,  positive ETCO2 and breath sounds checked- equal and bilateral Secured at: 21 cm Tube secured with: Tape Dental Injury: Teeth and Oropharynx as per pre-operative assessment

## 2017-02-13 NOTE — Transfer of Care (Signed)
Immediate Anesthesia Transfer of Care Note  Patient: Yvonne [name]  Procedure(s) Performed: Transforaminal Lumbar Interbody Fusion - Lumbar four-five (N/A Back)  Patient Location: PACU  Anesthesia Type:General  Level of Consciousness: awake and patient cooperative  Airway & Oxygen Therapy: Patient Spontanous Breathing and Patient connected to nasal cannula oxygen  Post-op Assessment: Report given to RN, Post -op Vital signs reviewed and stable and Patient moving all extremities  Post vital signs: Reviewed and stable  Last Vitals:  Vitals:   02/13/17 1550 02/13/17 1555  BP: (P) 123/80 123/80  Pulse: (P) 98 99  Resp: (!) (P) 25 (!) 23  Temp: 36.8 C   SpO2: (P) 99% 98%    Last Pain:  Vitals:   02/13/17 1056  TempSrc:   PainSc: 9          Complications: No apparent anesthesia complications

## 2017-02-13 NOTE — Anesthesia Postprocedure Evaluation (Signed)
Anesthesia Post Note  Patient: Yvonne [name]  Procedure(s) Performed: Transforaminal Lumbar Interbody Fusion - Lumbar four-five (N/A Back)     Patient location during evaluation: PACU Anesthesia Type: General Level of consciousness: awake and alert Pain management: pain level controlled Vital Signs Assessment: post-procedure vital signs reviewed and stable Respiratory status: spontaneous breathing, nonlabored ventilation and respiratory function stable Cardiovascular status: blood pressure returned to baseline and stable Postop Assessment: no apparent nausea or vomiting Anesthetic complications: no    Last Vitals:  Vitals:   02/13/17 1700 02/13/17 1715  BP: 108/65   Pulse: 77 81  Resp: 16 17  Temp:  36.8 C  SpO2: 96% 99%    Last Pain:  Vitals:   02/13/17 1645  TempSrc:   PainSc: Asleep                 Lowella CurbWarren Ray Braxley Balandran

## 2017-02-13 NOTE — Anesthesia Preprocedure Evaluation (Signed)
Anesthesia Evaluation  Patient identified by MRN, date of birth, ID band Patient awake    Reviewed: Allergy & Precautions, NPO status , Patient's Chart, lab work & pertinent test results  Airway Mallampati: II  TM Distance: >3 FB Neck ROM: Full    Dental  (+) Teeth Intact, Dental Advisory Given   Pulmonary neg pulmonary ROS, shortness of breath, asthma , Current Smoker,    breath sounds clear to auscultation       Cardiovascular negative cardio ROS   Rhythm:Regular Rate:Normal     Neuro/Psych  Headaches,  Neuromuscular disease negative neurological ROS  negative psych ROS   GI/Hepatic negative GI ROS, Neg liver ROS,   Endo/Other  negative endocrine ROS  Renal/GU negative Renal ROS  negative genitourinary   Musculoskeletal negative musculoskeletal ROS (+)   Abdominal   Peds negative pediatric ROS (+)  Hematology negative hematology ROS (+)   Anesthesia Other Findings   Reproductive/Obstetrics negative OB ROS                             Anesthesia Physical  Anesthesia Plan  ASA: II  Anesthesia Plan: General   Post-op Pain Management:    Induction: Intravenous  PONV Risk Score and Plan: 2 and Ondansetron and Midazolam  Airway Management Planned: Oral ETT  Additional Equipment:   Intra-op Plan:   Post-operative Plan: Extubation in OR  Informed Consent: I have reviewed the patients History and Physical, chart, labs and discussed the procedure including the risks, benefits and alternatives for the proposed anesthesia with the patient or authorized representative who has indicated his/her understanding and acceptance.   Dental advisory given  Plan Discussed with: CRNA and Surgeon  Anesthesia Plan Comments:         Anesthesia Quick Evaluation

## 2017-02-13 NOTE — H&P (Signed)
Subjective: Patient is a 31 y.o. female admitted for PLIF. Onset of symptoms was several months ago, gradually worsening since that time.  The pain is rated severe, and is located at the across the lower back and radiates to leg. The pain is described as aching and occurs all day. The symptoms have been progressive. Symptoms are exacerbated by exercise. 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 Anemia   . Anxiety   . 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 Asthma    albuterol inhaler  . Blood transfusion    2008 with c/s 4 units transfused  . Carpal tunnel syndrome on right   . Chronic back pain   . Depression   . Dyspnea    exertion  . Headache(784.0)    otc ibuprofen 800mg   . Heart murmur    as a child  . Obesity   . Pneumonia    as a child    Past Surgical History:  Procedure Laterality Date  . APPENDECTOMY    . BACK SURGERY    . CESAREAN SECTION    . CHOLECYSTECTOMY    . ENDOMETRIAL ABLATION    . HAND SURGERY Right    carpal tunnel  . LAPAROSCOPY     x3  . TONSILLECTOMY     T & A  . TUBAL LIGATION      Prior to Admission medications   Medication Sig Start Date End Date Taking? Authorizing Provider  ibuprofen (ADVIL,MOTRIN) 800 MG tablet TAKE 1 TABLET(800 MG) BY MOUTH EVERY 8 HOURS AS NEEDED Patient taking differently: TAKE 1 TABLET(800 MG) BY MOUTH EVERY 8 HOURS AS NEEDED FOR PAIN OR HEADACHE 12/24/16  Yes Kerrin ChampagneNitka, James E, MD  QUEtiapine (SEROQUEL) 300 MG tablet Take 300 mg by mouth at bedtime.   Yes [provider]  docusate sodium (COLACE) 100 MG capsule Take 1 capsule (100 mg total) by mouth 2 (two) times daily. Patient not taking: Reported on 02/07/2017 04/11/16   Kerrin ChampagneNitka, James E, MD  ferrous sulfate 325 (65 FE) MG tablet Take 1 tablet (325 mg total) by mouth daily. Take one table once daily after a meal Patient not taking: Reported on 02/07/2017 06/21/16   Barrett HenleNadeau, Nicole Elizabeth, PA-C   HYDROcodone-acetaminophen (NORCO/VICODIN) 5-325 MG tablet Take 1 tablet by mouth every 6 (six) hours as needed for moderate pain. Patient not taking: Reported on 02/07/2017 05/22/16   Kerrin ChampagneNitka, James E, MD  methocarbamol (ROBAXIN) 500 MG tablet Take 1 tablet (500 mg total) by mouth every 8 (eight) hours as needed for muscle spasms. Patient not taking: Reported on 02/07/2017 04/10/16   Kerrin ChampagneNitka, James E, MD  oxyCODONE-acetaminophen (PERCOCET/ROXICET) 5-325 MG tablet Take 1-2 tablets by mouth every 4 (four) hours as needed for moderate pain. Patient not taking: Reported on 02/07/2017 04/10/16   Kerrin ChampagneNitka, James E, MD  oxyCODONE-acetaminophen (ROXICET) 5-325 MG tablet Take 1 tablet by mouth every 6 (six) hours as needed for severe pain. Patient not taking: Reported on 02/07/2017 04/16/16   Naida Sleightwens, James M, PA-C   Allergies  Allergen Reactions  . Bee Venom Anaphylaxis, Swelling and Other (See Comments)    SYNCOPE SWELLING OF ENTIRE BODY AND THROAT   . Reglan [Metoclopramide Hcl] Anaphylaxis  . Aspirin Nausea And Vomiting    Social History   Tobacco Use  . Smoking status: Current Every Day Smoker    Packs/day: 0.00    Types:  Cigarettes  . Smokeless tobacco: Never Used  . Tobacco comment: 1-2 cigarettes a day  Substance Use Topics  . Alcohol use: Yes    Alcohol/week: 0.6 oz    Types: 1 Glasses of wine per week    Comment: ocassionally    Family History  Problem Relation Age of Onset  . Diabetes Mother   . Diabetes Maternal Grandmother   . Stroke Father      Review of Systems  Positive ROS: neg  All other systems have been reviewed and were otherwise negative with the exception of those mentioned in the HPI and as above.  Objective: Vital signs in last 24 hours: Temp:  [98.7 F (37.1 C)] 98.7 F (37.1 C) (11/14 1045) Pulse Rate:  [71] 71 (11/14 1045) Resp:  [18] 18 (11/14 1045) BP: (108)/(66) 108/66 (11/14 1045) SpO2:  [100 %] 100 % (11/14 1045) Weight:  [104.8 kg (231 lb)] 104.8 kg (231  lb) (11/14 1056)  General Appearance: Alert, cooperative, no distress, appears stated age Head: Normocephalic, without obvious abnormality, atraumatic Eyes: PERRL, conjunctiva/corneas clear, EOM's intact    Neck: Supple, symmetrical, trachea midline Back: Symmetric, no curvature, ROM normal, no CVA tenderness Lungs:  respirations unlabored Heart: Regular rate and rhythm Abdomen: Soft, non-tender Extremities: Extremities normal, atraumatic, no cyanosis or edema Pulses: 2+ and symmetric all extremities Skin: Skin color, texture, turgor normal, no rashes or lesions  NEUROLOGIC:   Mental status: Alert and oriented x4,  no aphasia, good attention span, fund of knowledge, and memory Motor Exam - grossly normal Sensory Exam - grossly normal Reflexes: 1+ Coordination - grossly normal Gait - grossly normal Balance - grossly normal Cranial Nerves: I: smell Not tested  II: visual acuity  OS: nl    OD: nl  II: visual fields Full to confrontation  II: pupils Equal, round, reactive to light  III,VII: ptosis None  III,IV,VI: extraocular muscles  Full ROM  V: mastication Normal  V: facial light touch sensation  Normal  V,VII: corneal reflex  Present  VII: facial muscle function - upper  Normal  VII: facial muscle function - lower Normal  VIII: hearing Not tested  IX: soft palate elevation  Normal  IX,X: gag reflex Present  XI: trapezius strength  5/5  XI: sternocleidomastoid strength 5/5  XI: neck flexion strength  5/5  XII: tongue strength  Normal    Data Review Lab Results  Component Value Date   WBC 12.3 (H) 02/11/2017   HGB 11.9 (L) 02/11/2017   HCT 36.4 02/11/2017   MCV 85.0 02/11/2017   PLT 339 02/11/2017   Lab Results  Component Value Date   NA 137 02/11/2017   K 4.0 02/11/2017   CL 103 02/11/2017   CO2 26 02/11/2017   BUN 6 02/11/2017   CREATININE 0.85 02/11/2017   GLUCOSE 110 (H) 02/11/2017   Lab Results  Component Value Date   INR 0.99 02/11/2017     Assessment/Plan: Patient admitted for PLIF L4-5. Patient has failed a reasonable attempt at conservative therapy.  I explained the condition and procedure to the patient and answered any questions.  Patient wishes to proceed with procedure as planned. Understands risks/ benefits and typical outcomes of procedure.   Zollie Clemence S 02/13/2017 12:32 PM

## 2017-02-13 NOTE — Op Note (Signed)
02/13/2017  3:47 PM  PATIENT:  Yvonne EnglishStephanie C Broy  31 y.o. female  PRE-OPERATIVE DIAGNOSIS:  Recurrent disc herniation L4-5, degenerative disc disease L4-5, And leg pain  POST-OPERATIVE DIAGNOSIS:  same  PROCEDURE:   1. Decompressive lumbar laminectomy L4-5 requiring more work than would be required for a simple exposure of the disk for PLIF in order to adequately decompress the neural elements and address the spinal stenosis 2. Transforaminal lumbar interbody fusion L4-5 using a porous titanium interbody cage packed with morcellized allograft and autograft 3. Posterior fixation L4-5 using Alphatec cortical pedicle screws.  4. Intertransverse arthrodesis L4-5 using morcellized autograft and allograft.  SURGEON:  Marikay Alaravid Lataisha Colan, MD  ASSISTANTSAdelene Idler: Meyran FNP  ANESTHESIA:  General  EBL: 75 ml  Total I/O In: 1000 [I.V.:1000] Out: 325 [Urine:250; Blood:75]  BLOOD ADMINISTERED:none  DRAINS: none   INDICATION FOR PROCEDURE: This patient presented with severe recurrent back and leg pain. She had left leg pain. She had had 2 previous microdiscectomies at L4-5 on the right by another surgeon. Imaging revealed recurrent disc herniation L4-5 with degenerative disc disease. The patient tried a reasonable attempt at conservative medical measures without relief. I recommended decompression and instrumented fusion to address the stenosis as well as the segmental  instability.  Patient understood the risks, benefits, and alternatives and potential outcomes and wished to proceed.  PROCEDURE DETAILS:  The patient was brought to the operating room. After induction of generalized endotracheal anesthesia the patient was rolled into the prone position on chest rolls and all pressure points were padded. The patient's lumbar region was cleaned and then prepped with DuraPrep and draped in the usual sterile fashion. Anesthesia was injected and then a dorsal midline incision was made and carried down to the  lumbosacral fascia. The fascia was opened and the paraspinous musculature was taken down in a subperiosteal fashion to expose the L4-5. A self-retaining retractor was placed. Intraoperative fluoroscopy confirmed my level, and I started with placement of the L4 and L5 cortical pedicle screws. The pedicle screw entry zones were identified utilizing surface landmarks and  AP and lateral fluoroscopy. I scored the cortex with the high-speed drill and then used the hand drill to drill an upward and outward direction into the pedicle. I then tapped line to line. I then placed a 5.5 x 35 mm cortical pedicle screw into the pedicles of L4 bilaterally and a 5.5 x 40 mm screw at L5 on the right. I then turned my attention to the decompression and complete lumbar laminectomy, hemi- facetectomy, and foraminotomy was performed at L4-5 on the left. The patient had significant spinal stenosis and this required more work than would be required for a simple exposure of the disc for posterior lumbar interbody fusion which would only require a limited laminotomy. Much more generous decompression and generous foraminotomy was undertaken in order to adequately decompress the neural elements and address the patient's leg pain. The yellow ligament was removed to expose the underlying dura and nerve roots, and generous foraminotomy was performed to adequately decompress the neural elements. Both the exiting and traversing nerve roots were decompressed until a coronary dilator passed easily along the nerve roots. Once the decompression was complete, I turned my attention to the transforaminal lumbar interbody fusion. The epidural venous vasculature was coagulated and cut sharply. Disc space was incised and the initial discectomy was performed with pituitary rongeurs. The disc space was distracted with sequential distractors to a height of 8 mm. We then used a series of  scrapers and shavers to prepare the endplates for fusion. The midline was  prepared with Epstein curettes. Once the near complete discectomy was finished, we packed the disc space with morcellized autograft and allograft and a extra small BMP-soaked sponge. we packed an appropriate sized interbody cage with local autograft and morcellized allograft, gently retracted the nerve root, and tapped the cage into position at L4-5 from the left.   We then turned our attention to the placement of the lower pedicle screw. The pedicle screw entry zone was identified utilizing surface landmarks and fluoroscopy. I drilled into the pedicle utilizing the hand drill, and tapped the pedicle with the appropriate tap. We palpated with a ball probe to assure no break in the cortex. We then placed a 5.5 x 35 mm cortical pedicle screw into the pedicles at L5 on the left. We then decorticated the transverse processes and laid a mixture of morcellized autograft and allograft out over these to perform intertransverse arthrodesis at L4-5. We then placed lordotic rods into the multiaxial screw heads of the pedicle screws and locked these in position with the locking caps and anti-torque device. We then checked our construct with AP and lateral fluoroscopy. Irrigated with copious amounts of bacitracin-containing saline solution. Inspected the nerve roots once again to assure adequate decompression, lined to the dura with Gelfoam, placed powdered vancomycin into the wound, and closed the muscle and the fascia with 0 Vicryl. Closed the subcutaneous tissues with 2-0 Vicryl and subcuticular tissues with 3-0 Vicryl. The skin was closed with benzoin and Steri-Strips. Dressing was then applied, the patient was awakened from general anesthesia and transported to the recovery room in stable condition. At the end of the procedure all sponge, needle and instrument counts were correct.   PLAN OF CARE: admit to inpatient  PATIENT DISPOSITION:  PACU - hemodynamically stable.   Delay start of Pharmacological VTE agent  (>24hrs) due to surgical blood loss or risk of bleeding:  yes

## 2017-02-14 MED ORDER — OXYCODONE-ACETAMINOPHEN 5-325 MG PO TABS: 1.0000 | ORAL_TABLET | Freq: Four times a day (QID) | ORAL | 0 refills | Status: DC | PRN

## 2017-02-14 MED ORDER — METHOCARBAMOL 500 MG PO TABS: 500.0000 mg | ORAL_TABLET | Freq: Three times a day (TID) | ORAL | 1 refills | Status: DC | PRN

## 2017-02-14 MED FILL — OXYCODONE-ACETAMINOPHEN 5-3: 5-325 | 5 days supply | Qty: 40 | Fill #0

## 2017-02-14 MED FILL — METHOCARBAMOL 500 MG TABS: 500 | 13 days supply | Qty: 40 | Fill #0
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
## 2017-02-14 NOTE — Discharge Summary (Signed)
Physician Discharge Summary  Patient ID: Yvonne [name] MRN: [id number] DOB/AGE: [birth year] 32 y.o.  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 The patient tolerated the procedure well and was taken to the recovery room and then to the floor in stable condition. The hospital course was routine. There were no complications. The wound remained clean dry and intact. Pt had appropriate back soreness. No complaints of leg pain or new N/T/W. The patient remained afebrile with stable vital signs, and tolerated a regular diet. The patient continued to increase activities, and pain was well controlled with oral pain medications.   Consults: None  Significant Diagnostic Studies:  Results for orders placed or performed during the hospital encounter of 02/11/17  Surgical pcr screen  Result Value Ref Range   MRSA, PCR NEGATIVE NEGATIVE   Staphylococcus aureus NEGATIVE NEGATIVE  Basic metabolic panel  Result Value Ref Range   Sodium 137 135 - 145 mmol/L   Potassium 4.0 3.5 - 5.1 mmol/L   Chloride 103 101 - 111 mmol/L   CO2 26 22 - 32 mmol/L   Glucose, Bld 110 (H) 65 - 99 mg/dL   BUN 6 6 - 20 mg/dL   Creatinine, Ser 4.090.85 0.44 - 1.00 mg/dL   Calcium 9.2 8.9 - 81.110.3 mg/dL   GFR calc non Af Amer >60 >60 mL/min   GFR calc Af Amer >60 >60 mL/min   Anion gap 8 5 - 15  CBC WITH DIFFERENTIAL  Result Value Ref Range   WBC 12.3 (H) 4.0 - 10.5 K/uL   RBC 4.28 3.87 - 5.11 MIL/uL   Hemoglobin 11.9 (L) 12.0 - 15.0 g/dL   HCT 91.436.4 78.236.0 - 95.646.0 %   MCV 85.0 78.0 - 100.0 fL   MCH 27.8 26.0 - 34.0 pg   MCHC 32.7 30.0 - 36.0 g/dL   RDW 21.313.4 08.611.5 - 57.815.5 %   Platelets 339 150 - 400 K/uL   Neutrophils Relative % 78 %   Neutro Abs 9.6 (H) 1.7 - 7.7 K/uL   Lymphocytes  Relative 16 %   Lymphs Abs 2.0 0.7 - 4.0 K/uL   Monocytes Relative 5 %   Monocytes Absolute 0.6 0.1 - 1.0 K/uL   Eosinophils Relative 1 %   Eosinophils Absolute 0.1 0.0 - 0.7 K/uL   Basophils Relative 0 %   Basophils Absolute 0.0 0.0 - 0.1 K/uL  Protime-INR  Result Value Ref Range   Prothrombin Time 13.0 11.4 - 15.2 seconds   INR 0.99   Type and screen [facility]  Result Value Ref Range   ABO/RH(D) A POS    Antibody Screen NEG    Sample Expiration 02/14/2017   ABO/Rh  Result Value Ref Range   ABO/RH(D) A POS     Chest 2 View  Result Date: 02/11/2017 CLINICAL DATA:  Post laminectomy syndrome. Preoperative examination. EXAM: CHEST  2 VIEW COMPARISON:  11/13/2015 FINDINGS: Both lungs are clear. Heart and mediastinum are within normal limits. The trachea is midline. No pleural effusions. No acute bone abnormality. IMPRESSION: No active cardiopulmonary disease. Electronically Signed   By: Richarda OverlieAdam  Henn M.D.   On: 02/11/2017 09:07   Dg Lumbar Spine 2-3 Views  Result Date: 02/13/2017 CLINICAL DATA:  PLIF. EXAM: LUMBAR SPINE - 2-3 VIEW; DG  C-ARM 61-120 MIN COMPARISON:  01/14/2017 FINDINGS: Examination demonstrates evidence of patient's L4-5 posterior fusion with bilateral pedicle screws as the hardware is intact. Interbody fusion of L4-5. Vertebral body alignment and heights are normal. There is minimal spondylosis of the lumbar spine. IMPRESSION: Evidence of patient's recent L4-5 posterior fusion with hardware intact. Electronically Signed   By: Elberta Fortisaniel  Boyle M.D.   On: 02/13/2017 15:35   Dg C-arm 1-60 Min  Result Date: 02/13/2017 CLINICAL DATA:  PLIF. EXAM: LUMBAR SPINE - 2-3 VIEW; DG C-ARM 61-120 MIN COMPARISON:  01/14/2017 FINDINGS: Examination demonstrates evidence of patient's L4-5 posterior fusion with bilateral pedicle screws as the hardware is intact. Interbody fusion of L4-5. Vertebral body alignment and heights are normal. There is minimal spondylosis of the  lumbar spine. IMPRESSION: Evidence of patient's recent L4-5 posterior fusion with hardware intact. Electronically Signed   By: Elberta Fortisaniel  Boyle M.D.   On: 02/13/2017 15:35    Antibiotics:  Anti-infectives (From admission, onward)   Start     Dose/Rate Route Frequency Ordered Stop   02/13/17 2100  ceFAZolin (ANCEF) IVPB 2g/100 mL premix     2 g 200 mL/hr over 30 Minutes Intravenous Every 8 hours 02/13/17 2005 02/14/17 0642   02/13/17 1534  vancomycin (VANCOCIN) powder  Status:  Discontinued       As needed 02/13/17 1541 02/13/17 1548   02/13/17 1415  bacitracin 50,000 Units in sodium chloride irrigation 0.9 % 500 mL irrigation  Status:  Discontinued       As needed 02/13/17 1415 02/13/17 1548   02/13/17 1047  ceFAZolin (ANCEF) IVPB 2g/100 mL premix     2 g 200 mL/hr over 30 Minutes Intravenous On call to O.R. 02/13/17 1047 02/13/17 1247      Discharge Exam: Blood pressure (!) 107/54, pulse 78, temperature 98 F (36.7 C), temperature source Oral, resp. rate 19, height 5' (1.524 m), weight 104.8 kg (231 lb), last menstrual period 07/25/2012, SpO2 99 %. Neurologic: Grossly normal Dressing dry  Discharge Medications:   Allergies as of 02/14/2017      Reactions   Bee Venom Anaphylaxis, Swelling, Other (See Comments)   SYNCOPE SWELLING OF ENTIRE BODY AND THROAT    Reglan [metoclopramide Hcl] Anaphylaxis   Aspirin Nausea And Vomiting      Medication List    STOP taking these medications   HYDROcodone-acetaminophen 5-325 MG tablet Commonly known as:  NORCO/VICODIN     TAKE these medications   docusate sodium 100 MG capsule Commonly known as:  COLACE Take 1 capsule (100 mg total) by mouth 2 (two) times daily.   ferrous sulfate 325 (65 FE) MG tablet Take 1 tablet (325 mg total) by mouth daily. Take one table once daily after a meal   ibuprofen 800 MG tablet Commonly known as:  ADVIL,MOTRIN TAKE 1 TABLET(800 MG) BY MOUTH EVERY 8 HOURS AS NEEDED What changed:  See the new  instructions.   methocarbamol 500 MG tablet Commonly known as:  ROBAXIN Take 1 tablet (500 mg total) every 8 (eight) hours as needed by mouth for muscle spasms.   oxyCODONE-acetaminophen 5-325 MG tablet Commonly known as:  PERCOCET/ROXICET Take 1-2 tablets every 6 (six) hours as needed by mouth for moderate pain. What changed:    when to take this  Another medication with the same name was removed. Continue taking this medication, and follow the directions you see here.   QUEtiapine 300 MG tablet Commonly known as:  SEROQUEL Take 300 mg by mouth at bedtime.  Durable Medical Equipment  (From admission, onward)        Start     Ordered   02/13/17 2006  DME Walker rolling  Once    Question:  Patient needs a walker to treat with the following condition  Answer:  S/P lumbar fusion   02/13/17 2005   02/13/17 2006  DME 3 n 1  Once     02/13/17 2005      Disposition: home   Final Dx: TLIF L4-5  Discharge Instructions     Remove dressing in 72 hours   Complete by:  As directed    Call MD for:  difficulty breathing, headache or visual disturbances   Complete by:  As directed    Call MD for:  persistant nausea and vomiting   Complete by:  As directed    Call MD for:  redness, tenderness, or signs of infection (pain, swelling, redness, odor or green/yellow discharge around incision site)   Complete by:  As directed    Call MD for:  severe uncontrolled pain   Complete by:  As directed    Call MD for:  temperature >100.4   Complete by:  As directed    Diet - low sodium heart healthy   Complete by:  As directed    Discharge instructions   Complete by:  As directed    May shower, no driving, no heavy lifting, no bending   Increase activity slowly   Complete by:  As directed       Follow-up Information    Tia Alert, MD. Schedule an appointment as soon as possible for a visit in 2 week(s).   Specialty:  Neurosurgery Contact information: [address] [place] [address] [phone]            Signed: Tia Alert 02/14/2017, 7:42 AM

## 2017-02-14 NOTE — Discharge Planning (Signed)
Patient IV removed.  RN assessment and VS revealed stability for DC to home.  Discharge papers given, explained and educated.  Informed of suggested FU appts and appts made (Nov 30 @ 245pm). Scripts printed, signed and printed. Once ride arrives, will be wheeled to front and family transporting home via car.  Waiting on ride to arrive.

## 2017-02-15 ENCOUNTER — Encounter (HOSPITAL_COMMUNITY): Payer: Self-pay | Admitting: Neurological Surgery

## 2017-06-15 IMAGING — DX DG RIBS W/ CHEST 3+V*L*
5 series · 5 of 5 positions shown · non-contrast
Comparison: Chest radiographs 12/03/2013 and 11/11/2013.

CLINICAL DATA: Left shoulder and chest pain after falling down
steps today. Initial encounter.

EXAM:
LEFT RIBS AND CHEST - 3+ VIEW

[chest pa]
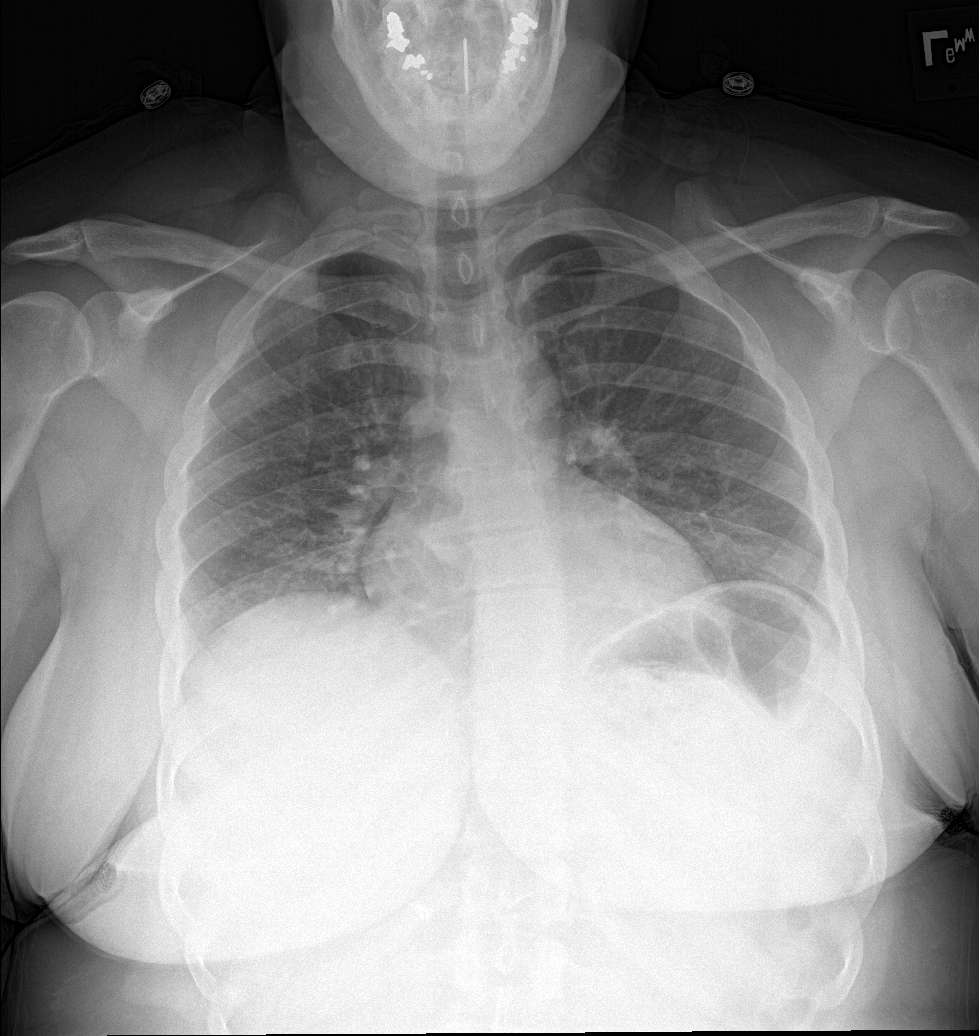

[rib pa (1 of 2)]
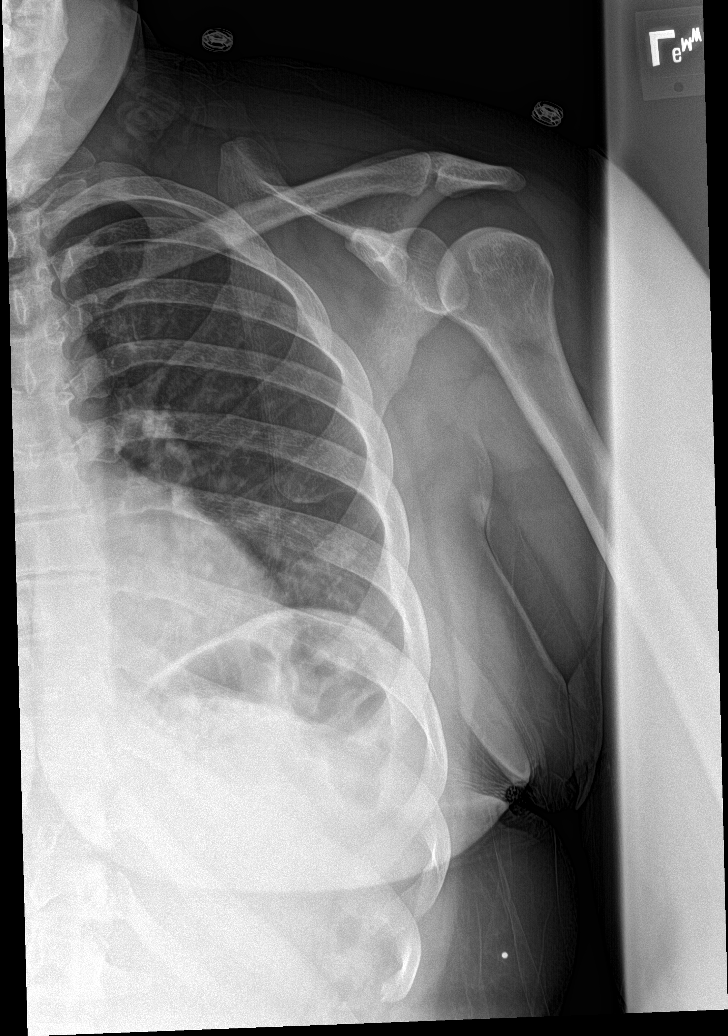

[rib ap obl]
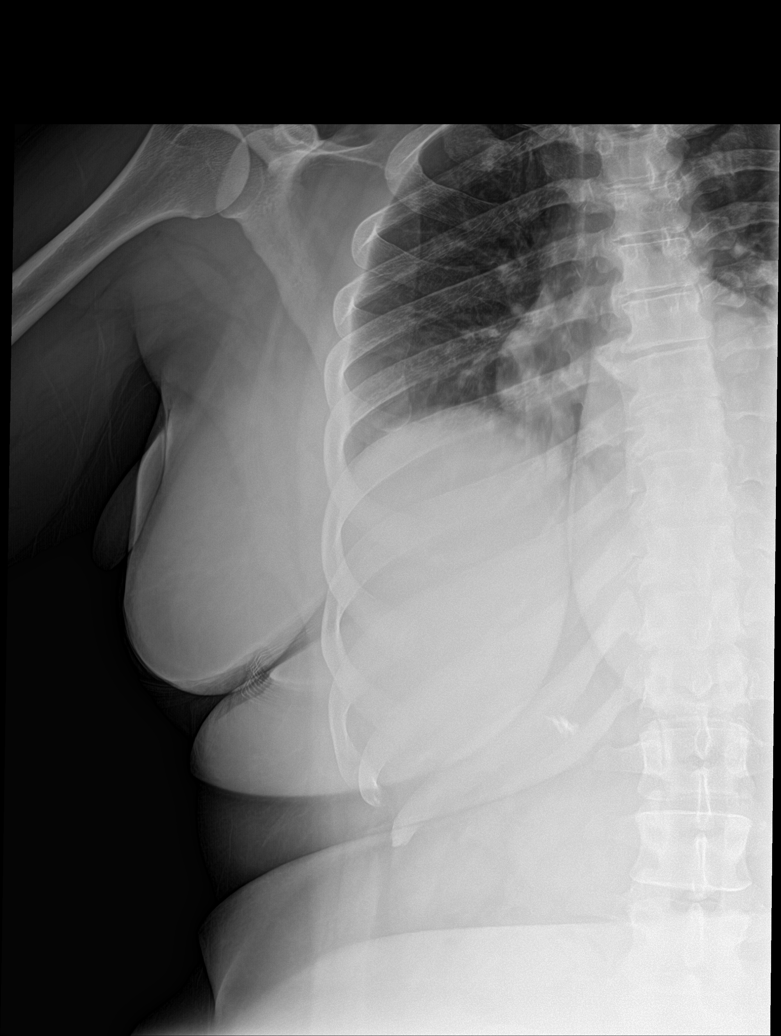

[rib pa (2 of 2)]
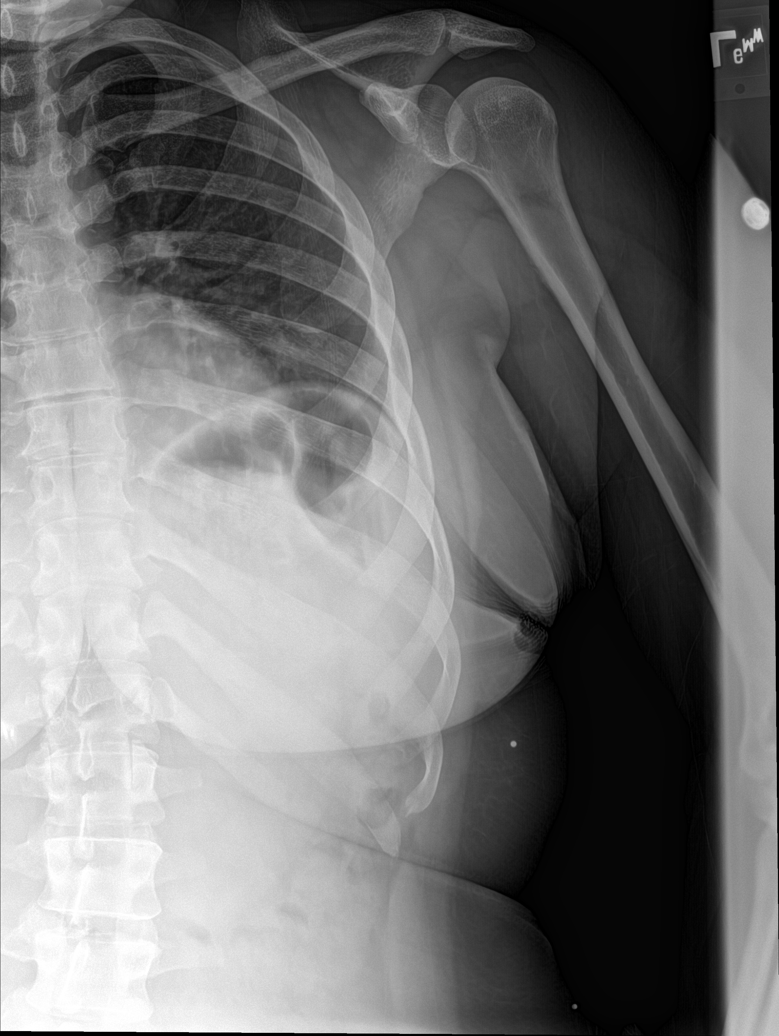

[rib pa obl]
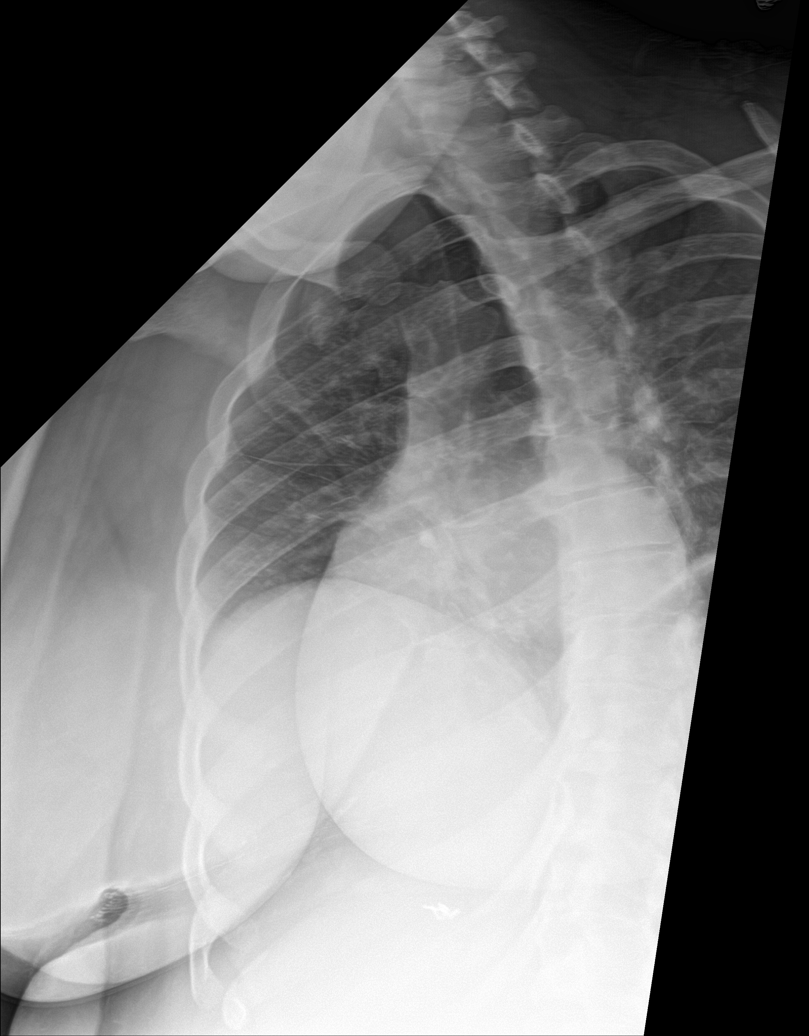

[5 of 5 positions shown; findings below may reference images not displayed]

FINDINGS: There are low lung volumes on the frontal examination with resulting
atelectasis at both lung bases. There is no confluent airspace
opacity, pleural effusion or pneumothorax. The heart size and
mediastinal contours are normal.

No evidence of acute rib fracture or focal rib lesion.
IMPRESSION: No evidence of rib fracture, pleural effusion or pneumothorax.
Suboptimal inspiration on frontal examination with resulting
bibasilar atelectasis.

## 2017-10-29 ENCOUNTER — Emergency Department (HOSPITAL_COMMUNITY): Payer: Medicaid Other

## 2017-10-29 ENCOUNTER — Encounter (HOSPITAL_COMMUNITY): Payer: Self-pay

## 2017-10-29 ENCOUNTER — Emergency Department (HOSPITAL_COMMUNITY)
Admission: EM | Admit: 2017-10-29 | Discharge: 2017-10-29 | Disposition: A | Discharge status: Home / Self Care | Payer: Medicaid Other | Attending: Emergency Medicine | Admitting: Emergency Medicine

## 2017-10-29 DIAGNOSIS — M546 Pain in thoracic spine: Secondary | ICD-10-CM | POA: Insufficient documentation

## 2017-10-29 DIAGNOSIS — J45909 Unspecified asthma, uncomplicated: Secondary | ICD-10-CM | POA: Insufficient documentation

## 2017-10-29 DIAGNOSIS — F17210 Nicotine dependence, cigarettes, uncomplicated: Secondary | ICD-10-CM | POA: Diagnosis not present

## 2017-10-29 DIAGNOSIS — Z79899 Other long term (current) drug therapy: Secondary | ICD-10-CM | POA: Diagnosis not present

## 2017-10-29 MED ORDER — KETOROLAC TROMETHAMINE 15 MG/ML IJ SOLN
15.0000 mg | Freq: Once | INTRAMUSCULAR | Status: AC
  Administered 2017-10-29: 15 mg via INTRAMUSCULAR
  Filled 2017-10-29: qty 1

## 2017-10-29 MED ORDER — CYCLOBENZAPRINE HCL 10 MG PO TABS: 10.0000 mg | ORAL_TABLET | Freq: Two times a day (BID) | ORAL | 0 refills | Status: DC | PRN

## 2017-10-29 NOTE — ED Triage Notes (Signed)
Pt presents with c/o back pain, mid to upper back. Pt has hx of back problems, multiple back surgeries. Pt reports that her back has been hurting for the last couple of days, no recent injury. Ambulatory to check-in window.

## 2017-10-29 NOTE — Discharge Instructions (Signed)
Please return for any problem.  Follow-up with your regular doctor as instructed. °

## 2017-10-29 NOTE — ED Provider Notes (Signed)
[facility]-EMERGENCY DEPT Provider Note   CSN: [id number] Arrival date & time: 10/29/17  0805     History   Chief Complaint Chief Complaint  Patient presents with  . Back Pain    HPI Yvonne [name] is a 32 y.o. female.  32 year old female with prior medical history as detailed below presents with complaint of upper back pain.  Patient works at a [employer] where she does lots of heavy lifting.  She reports tightness and discomfort to the upper back.  Symptoms started approximately 3 days ago.  Pain is been worse with heavy lifting.  She denies associated chest pain or shortness of breath.  She has taken some ibuprofen and gabapentin at home without improvement in her symptoms.  She denies associated fever, cough, abdominal pain, weakness, or other complaint.  The history is provided by the patient and medical records.  Back Pain   This is a new problem. The current episode started more than 2 days ago. The problem occurs rarely. The problem has not changed since onset.The pain is associated with lifting heavy objects. The pain is present in the thoracic spine. The quality of the pain is described as aching. The pain does not radiate. The pain is mild. The symptoms are aggravated by bending, twisting and certain positions. Pertinent negatives include no chest pain, no fever, no numbness, no abdominal swelling, no bowel incontinence, no dysuria, no pelvic pain, no paresis, no tingling and no weakness. She has tried NSAIDs for the symptoms.    Past Medical History:  Diagnosis Date  . Anemia   . Anxiety   . 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 Asthma    albuterol inhaler  . Blood transfusion    2008 with c/s 4 units transfused  . Carpal tunnel syndrome on right   . Chronic back pain   . Depression   . Dyspnea    exertion  . Headache(784.0)    otc ibuprofen 800mg   . Heart murmur    as a child  . Obesity   . Pneumonia     as a child    Patient Active Problem List   Diagnosis Date Noted  . S/P lumbar spinal fusion 02/13/2017  . Herniated nucleus pulposus, lumbar 04/10/2016  . HNP (herniated nucleus pulposus), lumbar 11/05/2014    Class: Chronic  . S/P endometrial ablation - HTA 2012 06/03/2012  . Sterilization - h/o BTL 06/03/2012  . OBESITY 09/03/2007  . MIGRAINE HEADACHE 09/03/2007    Past Surgical History:  Procedure Laterality Date  . ABDOMINAL HYSTERECTOMY N/A 09/29/2012   Procedure: HYSTERECTOMY ABDOMINAL;  Surgeon: Purcell Nails, MD;  Location: [facility];  Service: Gynecology;  Laterality: N/A;  . APPENDECTOMY    . BACK SURGERY    . BILATERAL SALPINGECTOMY Bilateral 09/29/2012   Procedure: BILATERAL SALPINGECTOMY;  Surgeon: Purcell Nails, MD;  Location: [facility];  Service: Gynecology;  Laterality: Bilateral;  . CESAREAN SECTION    . CHOLECYSTECTOMY    . CYSTOSCOPY Bilateral 09/29/2012   Procedure: CYSTOSCOPY;  Surgeon: Purcell Nails, MD;  Location: [facility];  Service: Gynecology;  Laterality: Bilateral;  . ENDOMETRIAL ABLATION    . EXAMINATION UNDER ANESTHESIA N/A 10/16/2012   Procedure: EXAM UNDER ANESTHESIA;  Surgeon: Purcell Nails, MD;  Location: [facility];  Service: Gynecology;  Laterality: N/A;  Suturing Vaginal Cuff  . HAND SURGERY Right    carpal tunnel  .  LAPAROSCOPIC ASSISTED VAGINAL HYSTERECTOMY N/A 09/29/2012   Procedure: LAPAROSCOPIC ASSISTED VAGINAL HYSTERECTOMY;  Surgeon: Purcell Nails, MD;  Location: [facility];  Service: Gynecology;  Laterality: N/A;  . LAPAROSCOPIC HYSTERECTOMY Bilateral 09/29/2012   Procedure: HYSTERECTOMY TOTAL LAPAROSCOPIC;  Surgeon: Purcell Nails, MD;  Location: [facility];  Service: Gynecology;  Laterality: Bilateral;  Total Laparoscopic Hysterectomy, Bilateral Salpingectomies, Cystoscopy poss. LAVH poss. TAH  . LAPAROSCOPY     x3  . LUMBAR LAMINECTOMY N/A 11/05/2014   Procedure: RIGHT L4-5 MICRODISCECTOMY ;  Surgeon: Kerrin Champagne, MD;  Location: [facility] OR;   Service: Orthopedics;  Laterality: N/A;  . LUMBAR LAMINECTOMY/DECOMPRESSION MICRODISCECTOMY N/A 04/10/2016   Procedure: Right L4-5 Microdiscectomy;  Surgeon: Kerrin Champagne, MD;  Location: [facility] OR;  Service: Orthopedics;  Laterality: N/A;  . TONSILLECTOMY     T & A  . TRANSFORAMINAL LUMBAR INTERBODY FUSION (TLIF) WITH PEDICLE SCREW FIXATION 1 LEVEL N/A 02/13/2017   Procedure: Transforaminal Lumbar Interbody Fusion - Lumbar four-five;  Surgeon: Tia Alert, MD;  Location: [facility] OR;  Service: Neurosurgery;  Laterality: N/A;  . TUBAL LIGATION       OB History    Gravida  4   Para  4   Term  4   Preterm      AB      Living  1     SAB      TAB      Ectopic      Multiple      Live Births  1            Home Medications    Prior to Admission medications   Medication Sig Start Date End Date Taking? Authorizing Provider  cyclobenzaprine (FLEXERIL) 10 MG tablet Take 1 tablet (10 mg total) by mouth 2 (two) times daily as needed for muscle spasms. 10/29/17   Wynetta Fines, MD  docusate sodium (COLACE) 100 MG capsule Take 1 capsule (100 mg total) by mouth 2 (two) times daily. Patient not taking: Reported on 02/07/2017 04/11/16   Kerrin Champagne, MD  ferrous sulfate 325 (65 FE) MG tablet Take 1 tablet (325 mg total) by mouth daily. Take one table once daily after a meal Patient not taking: Reported on 10/29/2017 06/21/16   Barrett Henle, PA-C  ibuprofen (ADVIL,MOTRIN) 800 MG tablet TAKE 1 TABLET(800 MG) BY MOUTH EVERY 8 HOURS AS NEEDED Patient taking differently: TAKE 1 TABLET(800 MG) BY MOUTH EVERY 8 HOURS AS NEEDED FOR PAIN OR HEADACHE 12/24/16   Kerrin Champagne, MD  methocarbamol (ROBAXIN) 500 MG tablet Take 1 tablet (500 mg total) every 8 (eight) hours as needed by mouth for muscle spasms. 02/14/17   Tia Alert, MD  oxyCODONE-acetaminophen (PERCOCET/ROXICET) 5-325 MG tablet Take 1-2 tablets every 6 (six) hours as needed by mouth for moderate pain. 02/14/17   Tia Alert, MD  QUEtiapine (SEROQUEL) 300 MG tablet Take 300 mg by mouth at bedtime.    [provider]    Family History Family History  Problem Relation Age of Onset  . Diabetes Mother   . Diabetes Maternal Grandmother   . Stroke Father     Social History Social History   Tobacco Use  . Smoking status: Current Every Day Smoker    Packs/day: 0.00    Types: Cigarettes  . Smokeless tobacco: Never Used  . Tobacco comment: 1-2 cigarettes a day  Substance Use Topics  . Alcohol use: Yes    Alcohol/week: 0.6 oz  Types: 1 Glasses of wine per week    Comment: ocassionally  . Drug use: No     Allergies   Bee venom; Reglan [metoclopramide hcl]; and Aspirin   Review of Systems Review of Systems  Constitutional: Negative for fever.  Cardiovascular: Negative for chest pain.  Gastrointestinal: Negative for bowel incontinence.  Genitourinary: Negative for dysuria and pelvic pain.  Musculoskeletal: Positive for back pain.  Neurological: Negative for tingling, weakness and numbness.  All other systems reviewed and are negative.    Physical Exam Updated Vital Signs BP (!) 142/87 (BP Location: Right Arm)   Pulse 71   Temp 98.4 F (36.9 C) (Oral)   Resp 18   Ht 5' (1.524 m)   Wt 107 kg (236 lb)   LMP 07/25/2012   SpO2 100%   BMI 46.09 kg/m   Physical Exam  Constitutional: She is oriented to person, place, and time. She appears well-developed and well-nourished. No distress.  HENT:  Head: Normocephalic and atraumatic.  Mouth/Throat: Oropharynx is clear and moist.  Eyes: Pupils are equal, round, and reactive to light. Conjunctivae and EOM are normal.  Neck: Normal range of motion. Neck supple.  Cardiovascular: Normal rate, regular rhythm and normal heart sounds.  Pulmonary/Chest: Effort normal and breath sounds normal. No respiratory distress.  Abdominal: Soft. She exhibits no distension. There is no tenderness.  Musculoskeletal: Normal range of motion. She exhibits  no edema or deformity.  Neurological: She is alert and oriented to person, place, and time.  Skin: Skin is warm and dry.  Psychiatric: She has a normal mood and affect.  Nursing note and vitals reviewed.    ED Treatments / Results  Labs (all labs ordered are listed, but only abnormal results are displayed) Labs Reviewed - No data to display  EKG None  Radiology Dg Chest 2 View  Result Date: 10/29/2017 CLINICAL DATA:  Chest pain, upper back pain EXAM: CHEST - 2 VIEW COMPARISON:  10/07/2017 FINDINGS: Rounded density projects over the midthoracic spine on the lateral view likely lateral osteophytes. Heart and mediastinal contours are within normal limits. No focal opacities or effusions. No acute bony abnormality. IMPRESSION: No active cardiopulmonary disease. Electronically Signed   By: Charlett NoseKevin  Dover M.D.   On: 10/29/2017 09:18    Procedures Procedures (including critical care time)  Medications Ordered in ED Medications  ketorolac (TORADOL) 15 MG/ML injection 15 mg (15 mg Intramuscular Given 10/29/17 0917)     Initial Impression / Assessment and Plan / ED Course  I have reviewed the triage vital signs and the nursing notes.  Pertinent labs & imaging results that were available during my care of the patient were reviewed by me and considered in my medical decision making (see chart for details).     MDM  Screen Complete  Presenting for evaluation of upper back pain.  Symptoms are suggestive of likely muscular strain.  Screening chest x-ray does not rule out any other acute pathology.    Patient feels improved following Toradol administration in the ED.    She desires discharge home now.  Close follow-up is advised.  Strict return precautions are given and understood.  Patient is advised to continue ibuprofen and will trial a course of Flexeril for symptomatic control.  Final Clinical Impressions(s) / ED Diagnoses   Final diagnoses:  Acute thoracic back pain, unspecified  back pain laterality    ED Discharge Orders        Ordered    cyclobenzaprine (FLEXERIL) 10 MG tablet  2 times daily PRN     10/29/17 0938       Wynetta Fines, MD 10/29/17 [id number]

## 2017-11-11 ENCOUNTER — Other Ambulatory Visit: Payer: Self-pay

## 2017-11-11 ENCOUNTER — Encounter (HOSPITAL_COMMUNITY): Payer: Self-pay

## 2017-11-11 ENCOUNTER — Emergency Department (HOSPITAL_COMMUNITY)
Admission: EM | Admit: 2017-11-11 | Discharge: 2017-11-11 | Disposition: A | Discharge status: Home / Self Care | Payer: Medicaid Other | Attending: Emergency Medicine | Admitting: Emergency Medicine

## 2017-11-11 ENCOUNTER — Emergency Department (HOSPITAL_COMMUNITY): Payer: Medicaid Other

## 2017-11-11 DIAGNOSIS — Z79899 Other long term (current) drug therapy: Secondary | ICD-10-CM | POA: Insufficient documentation

## 2017-11-11 DIAGNOSIS — F17210 Nicotine dependence, cigarettes, uncomplicated: Secondary | ICD-10-CM | POA: Diagnosis not present

## 2017-11-11 DIAGNOSIS — M79642 Pain in left hand: Secondary | ICD-10-CM | POA: Insufficient documentation

## 2017-11-11 MED ORDER — ACETAMINOPHEN 500 MG PO TABS
1000.0000 mg | ORAL_TABLET | Freq: Once | ORAL | Status: AC
  Administered 2017-11-11: 1000 mg via ORAL
  Filled 2017-11-11: qty 2

## 2017-11-11 NOTE — ED Notes (Signed)
Ortho notified

## 2017-11-11 NOTE — ED Triage Notes (Signed)
Pt reports left hand pain beginning 3 days ago. Pt denies injury or trauma. Pt reports waking with the pain. + strong and present at radial site. Pt reports pain to thumb and lateral aspect of hand.

## 2017-11-11 NOTE — ED Notes (Signed)
X-ray at bedside

## 2017-11-11 NOTE — ED Provider Notes (Signed)
[facility]-EMERGENCY DEPT Provider Note   CSN: [id number] Arrival date & time: 11/11/17  1527     History   Chief Complaint Chief Complaint  Patient presents with  . Hand Pain    HPI Yvonne [name] is a 32 y.o. female who presents for evaluation of left hand pain has been ongoing for last 3 days.  Patient does not recall specific trauma, injury, fall.  Patient states that the pain is worse in the thumb and the radial aspect of the wrist.  She reports limited range of motion secondary to pain.  Patient reports he has been taking Tylenol and ibuprofen with minimal improvement in pain.  Patient does report that she had recent carpal tunnel surgery in the hand on 10/07/17.  Patient denies any numbness/weakness, fevers, redness, warmth.  The history is provided by the patient.    Past Medical History:  Diagnosis Date  . Anemia   . Anxiety   . 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 Asthma    albuterol inhaler  . Blood transfusion    2008 with c/s 4 units transfused  . Carpal tunnel syndrome on right   . Chronic back pain   . Depression   . Dyspnea    exertion  . Headache(784.0)    otc ibuprofen 800mg   . Heart murmur    as a child  . Obesity   . Pneumonia    as a child    Patient Active Problem List   Diagnosis Date Noted  . S/P lumbar spinal fusion 02/13/2017  . Herniated nucleus pulposus, lumbar 04/10/2016  . HNP (herniated nucleus pulposus), lumbar 11/05/2014    Class: Chronic  . S/P endometrial ablation - HTA 2012 06/03/2012  . Sterilization - h/o BTL 06/03/2012  . OBESITY 09/03/2007  . MIGRAINE HEADACHE 09/03/2007    Past Surgical History:  Procedure Laterality Date  . ABDOMINAL HYSTERECTOMY N/A 09/29/2012   Procedure: HYSTERECTOMY ABDOMINAL;  Surgeon: Purcell Nails, MD;  Location: [facility];  Service: Gynecology;  Laterality: N/A;  . APPENDECTOMY    . BACK SURGERY    . BILATERAL SALPINGECTOMY Bilateral  09/29/2012   Procedure: BILATERAL SALPINGECTOMY;  Surgeon: Purcell Nails, MD;  Location: [facility];  Service: Gynecology;  Laterality: Bilateral;  . CESAREAN SECTION    . CHOLECYSTECTOMY    . CYSTOSCOPY Bilateral 09/29/2012   Procedure: CYSTOSCOPY;  Surgeon: Purcell Nails, MD;  Location: [facility];  Service: Gynecology;  Laterality: Bilateral;  . ENDOMETRIAL ABLATION    . EXAMINATION UNDER ANESTHESIA N/A 10/16/2012   Procedure: EXAM UNDER ANESTHESIA;  Surgeon: Purcell Nails, MD;  Location: [facility];  Service: Gynecology;  Laterality: N/A;  Suturing Vaginal Cuff  . HAND SURGERY Right    carpal tunnel  . LAPAROSCOPIC ASSISTED VAGINAL HYSTERECTOMY N/A 09/29/2012   Procedure: LAPAROSCOPIC ASSISTED VAGINAL HYSTERECTOMY;  Surgeon: Purcell Nails, MD;  Location: [facility];  Service: Gynecology;  Laterality: N/A;  . LAPAROSCOPIC HYSTERECTOMY Bilateral 09/29/2012   Procedure: HYSTERECTOMY TOTAL LAPAROSCOPIC;  Surgeon: Purcell Nails, MD;  Location: [facility];  Service: Gynecology;  Laterality: Bilateral;  Total Laparoscopic Hysterectomy, Bilateral Salpingectomies, Cystoscopy poss. LAVH poss. TAH  . LAPAROSCOPY     x3  . LUMBAR LAMINECTOMY N/A 11/05/2014   Procedure: RIGHT L4-5 MICRODISCECTOMY ;  Surgeon: Kerrin Champagne, MD;  Location: [facility];  Service: Orthopedics;  Laterality: N/A;  . LUMBAR LAMINECTOMY/DECOMPRESSION MICRODISCECTOMY N/A 04/10/2016  Procedure: Right L4-5 Microdiscectomy;  Surgeon: Kerrin ChampagneJames E Nitka, MD;  [facility];  Service: Orthopedics;  Laterality: N/A;  . TONSILLECTOMY     T & A  . TRANSFORAMINAL LUMBAR INTERBODY FUSION (TLIF) WITH PEDICLE SCREW FIXATION 1 LEVEL N/A 02/13/2017   Procedure: Transforaminal Lumbar Interbody Fusion - Lumbar four-five;  Surgeon: Tia AlertJones, David S, MD;  [facility];  Service: Neurosurgery;  Laterality: N/A;  . TUBAL LIGATION       OB History    Gravida  4   Para  4   Term  4   Preterm      AB      Living  1     SAB      TAB      Ectopic       Multiple      Live Births  1            Home Medications    Prior to Admission medications   Medication Sig Start Date End Date Taking? Authorizing Provider  cyclobenzaprine (FLEXERIL) 10 MG tablet Take 1 tablet (10 mg total) by mouth 2 (two) times daily as needed for muscle spasms. 10/29/17   Wynetta FinesMessick, Peter C, MD  docusate sodium (COLACE) 100 MG capsule Take 1 capsule (100 mg total) by mouth 2 (two) times daily. Patient not taking: Reported on 02/07/2017 04/11/16   Kerrin ChampagneNitka, James E, MD  ferrous sulfate 325 (65 FE) MG tablet Take 1 tablet (325 mg total) by mouth daily. Take one table once daily after a meal Patient not taking: Reported on 10/29/2017 06/21/16   Barrett HenleNadeau, Nicole Elizabeth, PA-C  ibuprofen (ADVIL,MOTRIN) 800 MG tablet TAKE 1 TABLET(800 MG) BY MOUTH EVERY 8 HOURS AS NEEDED Patient taking differently: TAKE 1 TABLET(800 MG) BY MOUTH EVERY 8 HOURS AS NEEDED FOR PAIN OR HEADACHE 12/24/16   Kerrin ChampagneNitka, James E, MD  methocarbamol (ROBAXIN) 500 MG tablet Take 1 tablet (500 mg total) every 8 (eight) hours as needed by mouth for muscle spasms. 02/14/17   Tia AlertJones, David S, MD  oxyCODONE-acetaminophen (PERCOCET/ROXICET) 5-325 MG tablet Take 1-2 tablets every 6 (six) hours as needed by mouth for moderate pain. 02/14/17   Tia AlertJones, David S, MD  QUEtiapine (SEROQUEL) 300 MG tablet Take 300 mg by mouth at bedtime.    [provider]    Family History Family History  Problem Relation Age of Onset  . Diabetes Mother   . Diabetes Maternal Grandmother   . Stroke Father     Social History Social History   Tobacco Use  . Smoking status: Current Every Day Smoker    Packs/day: 0.00    Types: Cigarettes  . Smokeless tobacco: Never Used  . Tobacco comment: 1-2 cigarettes a day  Substance Use Topics  . Alcohol use: Yes    Alcohol/week: 1.0 standard drinks    Types: 1 Glasses of wine per week    Comment: ocassionally  . Drug use: No     Allergies   Bee venom; Reglan [metoclopramide  hcl]; and Aspirin   Review of Systems Review of Systems  Constitutional: Negative for fever.  Musculoskeletal:       Left hand pain  Skin: Negative for color change.  Neurological: Negative for weakness and numbness.     Physical Exam Updated Vital Signs BP 140/89   Pulse 93   Temp 98.9 F (37.2 C) (Oral)   Resp 18   Ht 5' (1.524 m)   Wt 108.4 kg   LMP 07/25/2012  SpO2 100%   BMI 46.68 kg/m   Physical Exam  Constitutional: She appears well-developed and well-nourished.  HENT:  Head: Normocephalic and atraumatic.  Eyes: Conjunctivae and EOM are normal. Right eye exhibits no discharge. Left eye exhibits no discharge. No scleral icterus.  Cardiovascular:  Pulses:      Radial pulses are 2+ on the right side, and 2+ on the left side.  Pulmonary/Chest: Effort normal.  Musculoskeletal:  Tenderness palpation noted to the base of the left thumb that extends overlying the MCP and just right at the snuffbox. Tenderness noted to the radial aspect of the hand and distal wrist. No deformity or crepitus. Flexion/extension of wrist intact.  Limited opposition, abduction/abduction of thumb secondary to pain.  No deformity or crepitus noted.  No overlying warmth, erythema of the thumb.  Full range of motion of remaining digits of fingers.  Neurological: She is alert.  Sensation intact along major nerve distributions of BUE  Skin: Skin is warm and dry. Capillary refill takes less than 2 seconds.  Good distal cap refill. LUE is not dusky in appearance or cool to touch.  Psychiatric: She has a normal mood and affect. Her speech is normal and behavior is normal.  Nursing note and vitals reviewed.    ED Treatments / Results  Labs (all labs ordered are listed, but only abnormal results are displayed) Labs Reviewed - No data to display  EKG None  Radiology Dg Hand Complete Left  Result Date: 11/11/2017 CLINICAL DATA:  Hand pain 3 days ago EXAM: LEFT HAND - COMPLETE 3+ VIEW  COMPARISON:  None. FINDINGS: No fracture or malalignment. Joint spaces are maintained. Soft tissues are unremarkable. IMPRESSION: Negative. Electronically Signed   By: Jasmine PangKim  Fujinaga M.D.   On: 11/11/2017 17:11    Procedures Procedures (including critical care time)  Medications Ordered in ED Medications  acetaminophen (TYLENOL) tablet 1,000 mg (1,000 mg Oral Given 11/11/17 1815)     Initial Impression / Assessment and Plan / ED Course  I have reviewed the triage vital signs and the nursing notes.  Pertinent labs & imaging results that were available during my care of the patient were reviewed by me and considered in my medical decision making (see chart for details).     32 year old female who presents for evaluation of left hand pain x3 days.  Does not recall specific trauma, injury, fall.  No fevers, warmth, erythema, numbness/weakness.  Does report limited range of motion secondary to pain.  Does endorse that she had recent carpal tunnel surgery on both hands in the middle of July. Patient is afebrile, non-toxic appearing, sitting comfortably on examination table. Vital signs reviewed and stable. Patient is neurovascularly intact.  On exam, she has tenderness palpation of the base of the thumb that extends to the radial aspect of the wrist overlying the snuffbox tenderness.  No deformity or crepitus noted.  No overlying warmth, erythema.  Consider musculoskeletal pain.  Additionally, this could be comp locations of her carpal tunnel surgery.  Low suspicion for acute fracture dislocation but also consideration.  Also consider sprain.  No infectious signs that would indicate acute cellulitis or paronychia.  History/physical exam is not concerning for flexor tenosynovitis, acute arterial embolism, septic arthritis.  Patient reviewed on PMP.  She has multiple narcotic prescriptions.  She received 60 Percocet on 10/15/2017.  Additionally prior to that she had been prescribed 120 Percocet on  08/07/2017, 20 Percocet on 08/05/2017, 10 Percocet on 07/31/2017, 30 Vicodin on 06/13/2017.  X-ray  reviewed.  Negative for any acute bony abnormality.  Discussed results with patient.  We will plan to place patient in a thumb spica given distribution of tenderness.  Patient does follow-up with hand doctor Mina Marble).  He is one who did her carpal tunnel surgery.  Instructed her to follow-up with him in his office in the next several days.  No signs of infection that would indicate need for antibiotic therapy.  Encourage on at home supportive care measures. Patient had ample opportunity for questions and discussion. All patient's questions were answered with full understanding. Strict return precautions discussed. Patient expresses understanding and agreement to plan.    Final Clinical Impressions(s) / ED Diagnoses   Final diagnoses:  Left hand pain    ED Discharge Orders    None       Rosana Hoes 11/11/17 2144    Little, Ambrose Finland, MD 11/14/17 1356

## 2017-11-11 NOTE — Discharge Instructions (Signed)
You can take Tylenol or Ibuprofen as directed for pain. You can alternate Tylenol and Ibuprofen every 4 hours. If you take Tylenol at 1pm, then you can take Ibuprofen at 5pm. Then you can take Tylenol again at 9pm.   Follow the RICE (Rest, Ice, Compression, Elevation) protocol as directed.   Follow-up with Dr. Wyn ForsterJuengel's office.  Call the office to arrange for an appointment.  Return to the emergency department for any fever, worsening pain, redness or swelling of the hand or any other worsening or concerning symptoms.

## 2018-01-06 ENCOUNTER — Emergency Department (HOSPITAL_COMMUNITY): Payer: Medicaid Other

## 2018-01-06 ENCOUNTER — Encounter (HOSPITAL_COMMUNITY): Payer: Self-pay | Admitting: *Deleted

## 2018-01-06 ENCOUNTER — Other Ambulatory Visit: Payer: Self-pay

## 2018-01-06 ENCOUNTER — Emergency Department (HOSPITAL_COMMUNITY)
Admission: EM | Admit: 2018-01-06 | Discharge: 2018-01-06 | Disposition: A | Discharge status: Home / Self Care | Payer: Medicaid Other | Attending: Emergency Medicine | Admitting: Emergency Medicine

## 2018-01-06 DIAGNOSIS — S199XXA Unspecified injury of neck, initial encounter: Secondary | ICD-10-CM | POA: Diagnosis present

## 2018-01-06 DIAGNOSIS — Y92410 Unspecified street and highway as the place of occurrence of the external cause: Secondary | ICD-10-CM | POA: Insufficient documentation

## 2018-01-06 DIAGNOSIS — Y9389 Activity, other specified: Secondary | ICD-10-CM | POA: Insufficient documentation

## 2018-01-06 DIAGNOSIS — J45909 Unspecified asthma, uncomplicated: Secondary | ICD-10-CM | POA: Diagnosis not present

## 2018-01-06 DIAGNOSIS — Z79899 Other long term (current) drug therapy: Secondary | ICD-10-CM | POA: Insufficient documentation

## 2018-01-06 DIAGNOSIS — S40012A Contusion of left shoulder, initial encounter: Secondary | ICD-10-CM | POA: Diagnosis not present

## 2018-01-06 DIAGNOSIS — Y999 Unspecified external cause status: Secondary | ICD-10-CM | POA: Diagnosis not present

## 2018-01-06 DIAGNOSIS — S161XXA Strain of muscle, fascia and tendon at neck level, initial encounter: Secondary | ICD-10-CM | POA: Diagnosis not present

## 2018-01-06 MED ORDER — OXYCODONE-ACETAMINOPHEN 5-325 MG PO TABS
2.0000 | ORAL_TABLET | Freq: Once | ORAL | Status: AC
  Administered 2018-01-06: 2 via ORAL
  Filled 2018-01-06: qty 2

## 2018-01-06 NOTE — ED Notes (Signed)
Signature pad not working. Verbalized understanding of discharge instructions.  
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
## 2018-01-06 NOTE — ED Notes (Addendum)
Patient awake alert, cooperative, crying, color pink,chets clear,good aertion,no retractions 3 plus pulses,2sec refill,patietn with po med tolerated, patient in wc significant other/mother at bedside,ambulatory, attentive to child

## 2018-01-06 NOTE — ED Notes (Signed)
Patient transported to X-ray 
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
## 2018-03-04 ENCOUNTER — Other Ambulatory Visit: Payer: Self-pay

## 2018-03-04 ENCOUNTER — Emergency Department (HOSPITAL_COMMUNITY)
Admission: EM | Admit: 2018-03-04 | Discharge: 2018-03-05 | Disposition: A | Discharge status: Home / Self Care | Payer: Medicaid Other | Attending: Emergency Medicine | Admitting: Emergency Medicine

## 2018-03-04 ENCOUNTER — Encounter (HOSPITAL_COMMUNITY): Payer: Self-pay | Admitting: Emergency Medicine

## 2018-03-04 DIAGNOSIS — J45909 Unspecified asthma, uncomplicated: Secondary | ICD-10-CM | POA: Diagnosis not present

## 2018-03-04 DIAGNOSIS — Z79899 Other long term (current) drug therapy: Secondary | ICD-10-CM | POA: Insufficient documentation

## 2018-03-04 DIAGNOSIS — M79675 Pain in left toe(s): Secondary | ICD-10-CM | POA: Insufficient documentation

## 2018-03-04 DIAGNOSIS — M79674 Pain in right toe(s): Secondary | ICD-10-CM | POA: Insufficient documentation

## 2018-03-04 DIAGNOSIS — F17210 Nicotine dependence, cigarettes, uncomplicated: Secondary | ICD-10-CM | POA: Insufficient documentation

## 2018-03-04 NOTE — ED Triage Notes (Addendum)
Patient c/o bilateral great toe pain since pedicure x1 month ago. Denies drainage. Reports standing for long periods of time at work.

## 2018-03-05 NOTE — ED Provider Notes (Signed)
[facility]-EMERGENCY DEPT Provider Note   CSN: [id number] Arrival date & time: 03/04/18  2126     History   Chief Complaint Chief Complaint  Patient presents with  . Toe Pain    HPI Yvonne [name] is a 32 y.o. female who presents today for evaluation of bilateral toe pain since she had a pedicure approximately 1 month ago.  She denies any drainage, no fevers redness or chills.  She says that she works 2 jobs, is on her feet constantly, and her feet constantly get wet as she washes dishes in the kitchen.  Her primary concern is crusting and color change on the distal tips of her bilateral great toes, right worse than left.  She has been trying to different kinds of lotions at home without relief.  HPI  Past Medical History:  Diagnosis Date  . Anemia   . Anxiety   . 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 Asthma    albuterol inhaler  . Blood transfusion    2008 with c/s 4 units transfused  . Carpal tunnel syndrome on right   . Chronic back pain   . Depression   . Dyspnea    exertion  . Headache(784.0)    otc ibuprofen 800mg   . Heart murmur    as a child  . Obesity   . Pneumonia    as a child    Patient Active Problem List   Diagnosis Date Noted  . S/P lumbar spinal fusion 02/13/2017  . Herniated nucleus pulposus, lumbar 04/10/2016  . HNP (herniated nucleus pulposus), lumbar 11/05/2014    Class: Chronic  . S/P endometrial ablation - HTA 2012 06/03/2012  . Sterilization - h/o BTL 06/03/2012  . OBESITY 09/03/2007  . MIGRAINE HEADACHE 09/03/2007    Past Surgical History:  Procedure Laterality Date  . ABDOMINAL HYSTERECTOMY N/A 09/29/2012   Procedure: HYSTERECTOMY ABDOMINAL;  Surgeon: Purcell Nails, MD;  Location: [facility];  Service: Gynecology;  Laterality: N/A;  . APPENDECTOMY    . BACK SURGERY    . BILATERAL SALPINGECTOMY Bilateral 09/29/2012   Procedure: BILATERAL SALPINGECTOMY;  Surgeon: Purcell Nails,  MD;  Location: [facility];  Service: Gynecology;  Laterality: Bilateral;  . CESAREAN SECTION    . CHOLECYSTECTOMY    . CYSTOSCOPY Bilateral 09/29/2012   Procedure: CYSTOSCOPY;  Surgeon: Purcell Nails, MD;  Location: [facility];  Service: Gynecology;  Laterality: Bilateral;  . ENDOMETRIAL ABLATION    . EXAMINATION UNDER ANESTHESIA N/A 10/16/2012   Procedure: EXAM UNDER ANESTHESIA;  Surgeon: Purcell Nails, MD;  Location: [facility];  Service: Gynecology;  Laterality: N/A;  Suturing Vaginal Cuff  . HAND SURGERY Right    carpal tunnel  . LAPAROSCOPIC ASSISTED VAGINAL HYSTERECTOMY N/A 09/29/2012   Procedure: LAPAROSCOPIC ASSISTED VAGINAL HYSTERECTOMY;  Surgeon: Purcell Nails, MD;  Location: [facility];  Service: Gynecology;  Laterality: N/A;  . LAPAROSCOPIC HYSTERECTOMY Bilateral 09/29/2012   Procedure: HYSTERECTOMY TOTAL LAPAROSCOPIC;  Surgeon: Purcell Nails, MD;  Location: [facility];  Service: Gynecology;  Laterality: Bilateral;  Total Laparoscopic Hysterectomy, Bilateral Salpingectomies, Cystoscopy poss. LAVH poss. TAH  . LAPAROSCOPY     x3  . LUMBAR LAMINECTOMY N/A 11/05/2014   Procedure: RIGHT L4-5 MICRODISCECTOMY ;  Surgeon: Kerrin Champagne, MD;  Location: [facility];  Service: Orthopedics;  Laterality: N/A;  . LUMBAR LAMINECTOMY/DECOMPRESSION MICRODISCECTOMY N/A 04/10/2016   Procedure: Right L4-5 Microdiscectomy;  Surgeon: [name]  Lynne LoganE Nitka, MD;  Location: [facility];  Service: Orthopedics;  Laterality: N/A;  . TONSILLECTOMY     T & A  . TRANSFORAMINAL LUMBAR INTERBODY FUSION (TLIF) WITH PEDICLE SCREW FIXATION 1 LEVEL N/A 02/13/2017   Procedure: Transforaminal Lumbar Interbody Fusion - Lumbar four-five;  Surgeon: Tia AlertJones, David S, MD;  [facility];  Service: Neurosurgery;  Laterality: N/A;  . TUBAL LIGATION       OB History    Gravida  4   Para  4   Term  4   Preterm      AB      Living  1     SAB      TAB      Ectopic      Multiple      Live Births  1            Home Medications     Prior to Admission medications   Medication Sig Start Date End Date Taking? Authorizing Provider  cyclobenzaprine (FLEXERIL) 10 MG tablet Take 1 tablet (10 mg total) by mouth 2 (two) times daily as needed for muscle spasms. 10/29/17   Wynetta FinesMessick, Peter C, MD  docusate sodium (COLACE) 100 MG capsule Take 1 capsule (100 mg total) by mouth 2 (two) times daily. Patient not taking: Reported on 02/07/2017 04/11/16   Kerrin ChampagneNitka, James E, MD  ferrous sulfate 325 (65 FE) MG tablet Take 1 tablet (325 mg total) by mouth daily. Take one table once daily after a meal Patient not taking: Reported on 10/29/2017 06/21/16   Barrett HenleNadeau, Nicole Aarica Wax, PA-C  ibuprofen (ADVIL,MOTRIN) 800 MG tablet TAKE 1 TABLET(800 MG) BY MOUTH EVERY 8 HOURS AS NEEDED Patient taking differently: TAKE 1 TABLET(800 MG) BY MOUTH EVERY 8 HOURS AS NEEDED FOR PAIN OR HEADACHE 12/24/16   Kerrin ChampagneNitka, James E, MD  methocarbamol (ROBAXIN) 500 MG tablet Take 1 tablet (500 mg total) every 8 (eight) hours as needed by mouth for muscle spasms. 02/14/17   Tia AlertJones, David S, MD  oxyCODONE-acetaminophen (PERCOCET/ROXICET) 5-325 MG tablet Take 1-2 tablets every 6 (six) hours as needed by mouth for moderate pain. 02/14/17   Tia AlertJones, David S, MD  QUEtiapine (SEROQUEL) 300 MG tablet Take 300 mg by mouth at bedtime.    [provider]    Family History Family History  Problem Relation Age of Onset  . Diabetes Mother   . Diabetes Maternal Grandmother   . Stroke Father     Social History Social History   Tobacco Use  . Smoking status: Current Every Day Smoker    Packs/day: 0.00    Types: Cigarettes  . Smokeless tobacco: Never Used  . Tobacco comment: 1-2 cigarettes a day  Substance Use Topics  . Alcohol use: Yes    Alcohol/week: 1.0 standard drinks    Types: 1 Glasses of wine per week    Comment: ocassionally  . Drug use: No     Allergies   Bee venom; Reglan [metoclopramide hcl]; and Aspirin   Review of Systems Review of Systems   Constitutional: Negative for chills and fever.  Musculoskeletal:       Bilateral great toe pain  Skin: Positive for color change.  All other systems reviewed and are negative.    Physical Exam Updated Vital Signs BP 120/76 (BP Location: Right Arm)   Pulse 81   Temp 98.6 F (37 C) (Oral)   Resp 20   Ht 5' (1.524 m)   Wt 111.1 kg   LMP 07/25/2012  SpO2 100%   BMI 47.85 kg/m   Physical Exam  Constitutional: She is oriented to person, place, and time. She appears well-developed. No distress.  Cardiovascular: Normal rate and intact distal pulses.  Bilateral 2+ DP/PT pulses.  Musculoskeletal:  Bilateral feet and great toes have no tenderness to palpation.  Neurological: She is alert and oriented to person, place, and time.  Station intact to bilateral feet  Skin: Skin is warm and dry. She is not diaphoretic.  Please see clinical image.  There is skin darkening on the tips of bilateral great toes, right worse than left.  There is no abnormal erythema, induration, or fluctuance.  There is scaling on the tips of the great toes bilaterally.  No paronychia.  Psychiatric: She has a normal mood and affect.  Nursing note and vitals reviewed.            ED Treatments / Results  Labs (all labs ordered are listed, but only abnormal results are displayed) Labs Reviewed - No data to display  EKG None  Radiology No results found.  Procedures Procedures (including critical care time)  Medications Ordered in ED Medications - No data to display   Initial Impression / Assessment and Plan / ED Course  I have reviewed the triage vital signs and the nursing notes.  Pertinent labs & imaging results that were available during my care of the patient were reviewed by me and considered in my medical decision making (see chart for details).    Patient presents today for evaluation of great toe pain for approximately 1 month after she got a pedicure.  Exam is not consistent with  bacterial infection.  There is slight color change on the tips of both great toes bilaterally with scaling, recommended using athlete's foot treatment, podiatry follow-up.  No paronychia.    Final Clinical Impressions(s) / ED Diagnoses   Final diagnoses:  Pain of right great toe  Pain of left great toe    ED Discharge Orders    None       Norman Clay 03/05/18 0156    Dione Booze, MD 03/05/18 [phone]

## 2018-03-05 NOTE — Discharge Instructions (Addendum)
Please start using antifungal/athlete's foot cream, powder, or lotion on your feet.  Please do your best to keep your feet clean and dry.  Please follow-up with a podiatrist.  If your toes get red, become more swollen, you develop fevers, or have additional concerns please seek additional medical care and evaluation.

## 2018-03-05 NOTE — ED Notes (Signed)
Great toes on bilateral feet were swollen, calloused and raised near toenail, patient reports that she works 2 jobs and is on her feet constantly.

## 2018-03-05 NOTE — ED Notes (Signed)
ED Provider at bedside. 

## 2018-04-08 ENCOUNTER — Encounter (INDEPENDENT_AMBULATORY_CARE_PROVIDER_SITE_OTHER): Payer: Self-pay | Admitting: Surgery

## 2018-04-08 ENCOUNTER — Ambulatory Visit (INDEPENDENT_AMBULATORY_CARE_PROVIDER_SITE_OTHER): Payer: Medicaid Other

## 2018-04-08 ENCOUNTER — Ambulatory Visit (INDEPENDENT_AMBULATORY_CARE_PROVIDER_SITE_OTHER): Payer: Medicaid Other | Admitting: Surgery

## 2018-04-08 VITALS — BP 111/69 | HR 79 | Resp 14 | Ht 60.0 in | Wt 239.0 lb

## 2018-04-08 DIAGNOSIS — M545 Low back pain, unspecified: Secondary | ICD-10-CM

## 2018-04-08 DIAGNOSIS — Z981 Arthrodesis status: Secondary | ICD-10-CM | POA: Diagnosis not present

## 2018-04-08 DIAGNOSIS — M25512 Pain in left shoulder: Secondary | ICD-10-CM | POA: Diagnosis not present

## 2018-04-08 MED ORDER — METHYLPREDNISOLONE 4 MG PO TABS: ORAL_TABLET | ORAL | 0 refills | Status: DC
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
## 2018-05-07 ENCOUNTER — Ambulatory Visit (INDEPENDENT_AMBULATORY_CARE_PROVIDER_SITE_OTHER): Payer: Medicaid Other | Admitting: Surgery

## 2018-05-15 ENCOUNTER — Encounter (INDEPENDENT_AMBULATORY_CARE_PROVIDER_SITE_OTHER): Payer: Self-pay | Admitting: Specialist

## 2018-05-15 ENCOUNTER — Ambulatory Visit (INDEPENDENT_AMBULATORY_CARE_PROVIDER_SITE_OTHER): Payer: Medicaid Other | Admitting: Specialist

## 2018-05-15 VITALS — BP 125/79 | HR 81 | Ht 60.0 in | Wt 239.0 lb

## 2018-05-15 DIAGNOSIS — S46912A Strain of unspecified muscle, fascia and tendon at shoulder and upper arm level, left arm, initial encounter: Secondary | ICD-10-CM | POA: Diagnosis not present

## 2018-05-15 DIAGNOSIS — M542 Cervicalgia: Secondary | ICD-10-CM

## 2018-05-15 DIAGNOSIS — M4326 Fusion of spine, lumbar region: Secondary | ICD-10-CM | POA: Diagnosis not present
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
## 2018-05-15 NOTE — Patient Instructions (Signed)
Avoid frequent bending and stooping  No lifting greater than 10 lbs. May use ice or moist heat for pain. Weight loss is of benefit. Best medication for lumbar disc disease is arthritis medications like motrin, celebrex and naprosyn. Exercise is important to improve your indurance and does allow people to function better inspite of back pain . Avoid overhead lifting and overhead use of the arms. Do not lift greater than 10 lbs. Tylenol ES one every 6-8 hours for pain and inflamation.  Continue with PT for another 2-3 weeks, then a home exercise program.

## 2018-05-21 ENCOUNTER — Encounter (HOSPITAL_COMMUNITY): Payer: Self-pay

## 2018-05-21 ENCOUNTER — Emergency Department (HOSPITAL_COMMUNITY)
Admission: EM | Admit: 2018-05-21 | Discharge: 2018-05-21 | Disposition: A | Discharge status: Home / Self Care | Payer: Medicaid Other | Attending: Emergency Medicine | Admitting: Emergency Medicine

## 2018-05-21 ENCOUNTER — Other Ambulatory Visit: Payer: Self-pay

## 2018-05-21 DIAGNOSIS — J45909 Unspecified asthma, uncomplicated: Secondary | ICD-10-CM | POA: Insufficient documentation

## 2018-05-21 DIAGNOSIS — M25512 Pain in left shoulder: Secondary | ICD-10-CM | POA: Diagnosis not present

## 2018-05-21 DIAGNOSIS — G8929 Other chronic pain: Secondary | ICD-10-CM | POA: Diagnosis not present

## 2018-05-21 DIAGNOSIS — Z87891 Personal history of nicotine dependence: Secondary | ICD-10-CM | POA: Insufficient documentation

## 2018-05-21 DIAGNOSIS — Z79899 Other long term (current) drug therapy: Secondary | ICD-10-CM | POA: Diagnosis not present

## 2018-05-21 MED ORDER — KETOROLAC TROMETHAMINE 30 MG/ML IJ SOLN
30.0000 mg | Freq: Once | INTRAMUSCULAR | Status: AC
  Administered 2018-05-21: 30 mg via INTRAMUSCULAR
  Filled 2018-05-21: qty 1

## 2018-05-21 NOTE — Discharge Instructions (Signed)
I have provided medication to help relieve your pain during this visit. I have also provided a resource guide to help setup an appointment for pain management.

## 2018-05-21 NOTE — ED Triage Notes (Signed)
Patient states, "I need a cortisone shot." Patient states she was sent to [facility] 03/2018 and then was told to go to her PCP. Patient states she received a cortisone shot for the pain in her shoulder blades 2 weeks ago. Patient states her doctor would not refill Gabapentin, Oxycodone, and Ibuprofen 800 mg. Patient states the pain clinic set her up with a Dr. Yetta Barre and she did not keep the appointment and now the pain clinic nor Dr. Yetta Barre will see her.
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
## 2018-08-06 ENCOUNTER — Ambulatory Visit (INDEPENDENT_AMBULATORY_CARE_PROVIDER_SITE_OTHER): Payer: Medicaid Other

## 2018-08-06 ENCOUNTER — Ambulatory Visit (INDEPENDENT_AMBULATORY_CARE_PROVIDER_SITE_OTHER): Payer: Medicaid Other | Admitting: Specialist

## 2018-08-06 ENCOUNTER — Other Ambulatory Visit: Payer: Self-pay

## 2018-08-06 ENCOUNTER — Encounter: Payer: Self-pay | Admitting: Specialist

## 2018-08-06 VITALS — BP 100/50 | HR 85 | Ht 60.0 in | Wt 239.0 lb

## 2018-08-06 DIAGNOSIS — M25511 Pain in right shoulder: Secondary | ICD-10-CM | POA: Diagnosis not present

## 2018-08-06 DIAGNOSIS — M542 Cervicalgia: Secondary | ICD-10-CM | POA: Diagnosis not present

## 2018-08-06 DIAGNOSIS — M25512 Pain in left shoulder: Secondary | ICD-10-CM

## 2018-08-06 DIAGNOSIS — R29898 Other symptoms and signs involving the musculoskeletal system: Secondary | ICD-10-CM

## 2018-08-06 DIAGNOSIS — M4124 Other idiopathic scoliosis, thoracic region: Secondary | ICD-10-CM | POA: Diagnosis not present

## 2018-08-06 MED ORDER — ALPRAZOLAM 0.5 MG PO TABS: ORAL_TABLET | ORAL | 0 refills | Status: DC

## 2018-08-06 MED ORDER — TIZANIDINE HCL 4 MG PO TABS: 4.0000 mg | ORAL_TABLET | Freq: Four times a day (QID) | ORAL | 0 refills | Status: DC | PRN
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
## 2018-08-06 NOTE — Addendum Note (Signed)
Addended by: Vira Browns on: 08/06/2018 10:21 AM   Modules accepted: Orders

## 2018-08-06 NOTE — Addendum Note (Signed)
Addended by: Vira Browns on: 08/06/2018 10:23 AM   Modules accepted: Orders

## 2018-08-15 ENCOUNTER — Telehealth: Payer: Self-pay

## 2018-08-15 ENCOUNTER — Other Ambulatory Visit (INDEPENDENT_AMBULATORY_CARE_PROVIDER_SITE_OTHER): Payer: Self-pay | Admitting: Specialist

## 2018-08-15 ENCOUNTER — Other Ambulatory Visit: Payer: Medicaid Other

## 2018-08-15 MED ORDER — TIZANIDINE HCL 4 MG PO TABS: 4.0000 mg | ORAL_TABLET | Freq: Four times a day (QID) | ORAL | 0 refills | Status: DC | PRN

## 2018-08-15 NOTE — Telephone Encounter (Signed)
Patient would like to have a Rx for pain until she has her MRI done on Monday.  Stated that she was not able to get her MRI done this morning due to her insurance.  Stated that the doctor had not approved for her to get the MRI done.  CB# is [phone].  Please advise.  Thank You.

## 2018-08-15 NOTE — Telephone Encounter (Signed)
I renewed her prescription for tizanidine, I do not recommend narcotics at this time. Yvonne [name]

## 2018-08-15 NOTE — Telephone Encounter (Signed)
Patient would like to have a Rx for pain until she has her MRI done on Monday.  Stated that she was not able to get her MRI done this morning due to her insurance.  Stated that the doctor had not approved for her to get the MRI done.  CB# is [phone].  Please advise.  Thank You. 

## 2018-08-18 ENCOUNTER — Telehealth: Payer: Self-pay | Admitting: Specialist

## 2018-08-18 NOTE — Telephone Encounter (Signed)
I called and lmom for pt to let her know that we could not send in pain meds,

## 2018-08-18 NOTE — Telephone Encounter (Signed)
Patient called wanting a call back. You left a message for her to call back.  [phone]

## 2018-08-18 NOTE — Telephone Encounter (Signed)
We are checking the status of MRI auth.

## 2018-08-20 NOTE — Telephone Encounter (Signed)
Were you able to contact Medicaid or [facility] about her MRI Prior auth?

## 2018-08-20 NOTE — Telephone Encounter (Signed)
Just called [employer] automated service number, it has been approved.

## 2018-08-21 NOTE — Telephone Encounter (Signed)
I called and spoke with patient, advised that her MRI has been approved, she also was asking for a list of Drs. That do back surgeries and all that take medicaid, I advised that I was not sure if they did or not but I gave her [address], and [facility], or [facility]

## 2018-08-26 ENCOUNTER — Other Ambulatory Visit: Payer: Self-pay

## 2018-08-26 ENCOUNTER — Encounter (HOSPITAL_COMMUNITY): Payer: Self-pay

## 2018-08-26 ENCOUNTER — Ambulatory Visit (HOSPITAL_COMMUNITY)
Admission: EM | Admit: 2018-08-26 | Discharge: 2018-08-26 | Disposition: A | Discharge status: Home / Self Care | Payer: Medicaid Other | Attending: Family Medicine | Admitting: Family Medicine

## 2018-08-26 DIAGNOSIS — N3001 Acute cystitis with hematuria: Secondary | ICD-10-CM

## 2018-08-26 LAB — POCT URINALYSIS DIP (DEVICE)
Bilirubin Urine: NEGATIVE
Glucose, UA: NEGATIVE mg/dL
Ketones, ur: NEGATIVE mg/dL
Nitrite: NEGATIVE
Protein, ur: NEGATIVE mg/dL
Specific Gravity, Urine: 1.025 (ref 1.005–1.030)
Urobilinogen, UA: 0.2 mg/dL (ref 0.0–1.0)
pH: 6.5 (ref 5.0–8.0)

## 2018-08-26 MED ORDER — FLUCONAZOLE 150 MG PO TABS: ORAL_TABLET | ORAL | 0 refills | Status: DC

## 2018-08-26 MED ORDER — NITROFURANTOIN MONOHYD MACRO 100 MG PO CAPS: 100.0000 mg | ORAL_CAPSULE | Freq: Two times a day (BID) | ORAL | 0 refills | Status: DC

## 2018-08-26 NOTE — ED Provider Notes (Signed)
[facility]    CSN: [id number] Arrival date & time: 08/26/18  1143     History   Chief Complaint Chief Complaint  Patient presents with  . Urinary Tract Infection    HPI Yvonne [name] is a 33 y.o. female.   HPI  Patient think she has a bladder infection.  She states that she has had some dysuria and frequency for the last 2 to 3 days.  No flank pain.  No fever.  No abdominal pain.  No nausea or vomiting.  She has not had any vaginal discharge or sign of vaginal infection.  She does not feel that she needs any testing for STD.  Past Medical History:  Diagnosis Date  . Anemia   . Anxiety   . 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 Asthma    albuterol inhaler  . Blood transfusion    2008 with c/s 4 units transfused  . Carpal tunnel syndrome on right   . Chronic back pain   . Depression   . Dyspnea    exertion  . Headache(784.0)    otc ibuprofen 800mg   . Heart murmur    as a child  . Obesity   . Pneumonia    as a child    Patient Active Problem List   Diagnosis Date Noted  . S/P lumbar spinal fusion 02/13/2017  . Herniated nucleus pulposus, lumbar 04/10/2016  . HNP (herniated nucleus pulposus), lumbar 11/05/2014    Class: Chronic  . S/P endometrial ablation - HTA 2012 06/03/2012  . Sterilization - h/o BTL 06/03/2012  . OBESITY 09/03/2007  . MIGRAINE HEADACHE 09/03/2007    Past Surgical History:  Procedure Laterality Date  . ABDOMINAL HYSTERECTOMY N/A 09/29/2012   Procedure: HYSTERECTOMY ABDOMINAL;  Surgeon: Purcell NailsAngela Y Roberts, MD;  Location: [facility];  Service: Gynecology;  Laterality: N/A;  . APPENDECTOMY    . BACK SURGERY    . BILATERAL SALPINGECTOMY Bilateral 09/29/2012   Procedure: BILATERAL SALPINGECTOMY;  Surgeon: Purcell NailsAngela Y Roberts, MD;  Location: [facility];  Service: Gynecology;  Laterality: Bilateral;  . CESAREAN SECTION    . CHOLECYSTECTOMY    . CYSTOSCOPY Bilateral 09/29/2012   Procedure: CYSTOSCOPY;  Surgeon:  Purcell NailsAngela Y Roberts, MD;  Location: [facility];  Service: Gynecology;  Laterality: Bilateral;  . ENDOMETRIAL ABLATION    . EXAMINATION UNDER ANESTHESIA N/A 10/16/2012   Procedure: EXAM UNDER ANESTHESIA;  Surgeon: Purcell NailsAngela Y Roberts, MD;  Location: [facility];  Service: Gynecology;  Laterality: N/A;  Suturing Vaginal Cuff  . HAND SURGERY Right    carpal tunnel  . LAPAROSCOPIC ASSISTED VAGINAL HYSTERECTOMY N/A 09/29/2012   Procedure: LAPAROSCOPIC ASSISTED VAGINAL HYSTERECTOMY;  Surgeon: Purcell NailsAngela Y Roberts, MD;  Location: [facility];  Service: Gynecology;  Laterality: N/A;  . LAPAROSCOPIC HYSTERECTOMY Bilateral 09/29/2012   Procedure: HYSTERECTOMY TOTAL LAPAROSCOPIC;  Surgeon: Purcell NailsAngela Y Roberts, MD;  Location: [facility];  Service: Gynecology;  Laterality: Bilateral;  Total Laparoscopic Hysterectomy, Bilateral Salpingectomies, Cystoscopy poss. LAVH poss. TAH  . LAPAROSCOPY     x3  . LUMBAR LAMINECTOMY N/A 11/05/2014   Procedure: RIGHT L4-5 MICRODISCECTOMY ;  Surgeon: Kerrin ChampagneJames E Nitka, MD;  [facility];  Service: Orthopedics;  Laterality: N/A;  . LUMBAR LAMINECTOMY/DECOMPRESSION MICRODISCECTOMY N/A 04/10/2016   Procedure: Right L4-5 Microdiscectomy;  Surgeon: Kerrin ChampagneJames E Nitka, MD;  [facility];  Service: Orthopedics;  Laterality: N/A;  . TONSILLECTOMY     T & A  .  TRANSFORAMINAL LUMBAR INTERBODY FUSION (TLIF) WITH PEDICLE SCREW FIXATION 1 LEVEL N/A 02/13/2017   Procedure: Transforaminal Lumbar Interbody Fusion - Lumbar four-five;  Surgeon: Tia Alert, MD;  [facility];  Service: Neurosurgery;  Laterality: N/A;  . TUBAL LIGATION      OB History    Gravida  4   Para  4   Term  4   Preterm      AB      Living  1     SAB      TAB      Ectopic      Multiple      Live Births  1            Home Medications    Prior to Admission medications   Medication Sig Start Date End Date Taking? Authorizing Provider  ALPRAZolam Prudy Feeler) 0.5 MG tablet Take one tablet po at the MRI. 08/06/18   Kerrin Champagne, MD  fluconazole (DIFLUCAN) 150 MG tablet Take one pill if you develop yeast infection.  May repeat in 3 d if needed 08/26/18   Eustace Moore, MD  nitrofurantoin, macrocrystal-monohydrate, (MACROBID) 100 MG capsule Take 1 capsule (100 mg total) by mouth 2 (two) times daily. 08/26/18   Eustace Moore, MD  QUEtiapine (SEROQUEL) 300 MG tablet Take 300 mg by mouth at bedtime.    [provider]  tiZANidine (ZANAFLEX) 4 MG tablet Take 1 tablet (4 mg total) by mouth every 6 (six) hours as needed for muscle spasms. 08/15/18   Kerrin Champagne, MD    Family History Family History  Problem Relation Age of Onset  . Diabetes Mother   . Diabetes Maternal Grandmother   . Stroke Father     Social History Social History   Tobacco Use  . Smoking status: Former Smoker    Packs/day: 0.00    Types: Cigarettes  . Smokeless tobacco: Never Used  . Tobacco comment: 1-2 cigarettes a day  Substance Use Topics  . Alcohol use: Yes    Alcohol/week: 1.0 standard drinks    Types: 1 Glasses of wine per week    Comment: ocassionally  . Drug use: No     Allergies   Bee venom; Reglan [metoclopramide hcl]; and Aspirin   Review of Systems Review of Systems  Constitutional: Negative for chills and fever.  HENT: Negative for ear pain and sore throat.   Eyes: Negative for pain and visual disturbance.  Respiratory: Negative for cough and shortness of breath.   Cardiovascular: Negative for chest pain and palpitations.  Gastrointestinal: Negative for abdominal pain and vomiting.  Genitourinary: Positive for dysuria and frequency. Negative for hematuria.  Musculoskeletal: Negative for arthralgias and back pain.  Skin: Negative for color change and rash.  Neurological: Negative for seizures and syncope.  All other systems reviewed and are negative.    Physical Exam Triage Vital Signs ED Triage Vitals  Enc Vitals Group     BP 08/26/18 1247 135/73     Pulse Rate 08/26/18 1247 91     Resp  08/26/18 1247 18     Temp 08/26/18 1247 99.1 F (37.3 C)     Temp Source 08/26/18 1247 Oral     SpO2 08/26/18 1247 100 %     Weight --      Height --      Head Circumference --      Peak Flow --      Pain Score 08/26/18 1244 0  Pain Loc --      Pain Edu? --      Excl. in GC? --    No data found.  Updated Vital Signs BP 135/73 (BP Location: Right Arm)   Pulse 91   Temp 99.1 F (37.3 C) (Oral)   Resp 18   LMP 07/25/2012   SpO2 100%   Visual Acuity Right Eye Distance:   Left Eye Distance:   Bilateral Distance:    Right Eye Near:   Left Eye Near:    Bilateral Near:     Physical Exam Constitutional:      General: She is not in acute distress.    Appearance: She is well-developed. She is obese.  HENT:     Head: Normocephalic and atraumatic.  Eyes:     Conjunctiva/sclera: Conjunctivae normal.     Pupils: Pupils are equal, round, and reactive to light.  Neck:     Musculoskeletal: Normal range of motion.  Cardiovascular:     Rate and Rhythm: Normal rate.  Pulmonary:     Effort: Pulmonary effort is normal. No respiratory distress.  Abdominal:     General: Bowel sounds are normal. There is no distension.     Palpations: Abdomen is soft.     Tenderness: There is no abdominal tenderness. There is no right CVA tenderness or left CVA tenderness.  Musculoskeletal: Normal range of motion.  Skin:    General: Skin is warm and dry.  Neurological:     General: No focal deficit present.     Mental Status: She is alert.  Psychiatric:        Mood and Affect: Mood normal.        Behavior: Behavior normal.      UC Treatments / Results  Labs (all labs ordered are listed, but only abnormal results are displayed) Labs Reviewed  POCT URINALYSIS DIP (DEVICE) - Abnormal; Notable for the following components:      Result Value   Hgb urine dipstick SMALL (*)    Leukocytes,Ua TRACE (*)    All other components within normal limits  URINE CULTURE    EKG None  Radiology  No results found.  Procedures Procedures (including critical care time)  Medications Ordered in UC Medications - No data to display  Initial Impression / Assessment and Plan / UC Course  I have reviewed the triage vital signs and the nursing notes.  Pertinent labs & imaging results that were available during my care of the patient were reviewed by me and considered in my medical decision making (see chart for details).  Clinical Course as of Aug 25 1428  Tue Aug 26, 2018  1301 POCT Urinalysis, Dipstick [YN]    Clinical Course User Index [YN] Eustace Moore, MD     Final Clinical Impressions(s) / UC Diagnoses   Final diagnoses:  Acute cystitis with hematuria     Discharge Instructions     Take antibiotic 2 times a day Take 2 doses today 1 now, then 1 at bedtime Take the Diflucan if needed for yeast infection symptoms Make sure you drink plenty of water Follow-up with your primary care doctor as scheduled    ED Prescriptions    Medication Sig Dispense Auth. Provider   nitrofurantoin, macrocrystal-monohydrate, (MACROBID) 100 MG capsule Take 1 capsule (100 mg total) by mouth 2 (two) times daily. 10 capsule Eustace Moore, MD   fluconazole (DIFLUCAN) 150 MG tablet Take one pill if you develop yeast infection.  May repeat in  3 d if needed 2 tablet Eustace Moore, MD     Controlled Substance Prescriptions [place] Controlled Substance Registry consulted? Not Applicable   Eustace Moore, MD 08/26/18 1430

## 2018-08-26 NOTE — Discharge Instructions (Signed)
Take antibiotic 2 times a day Take 2 doses today 1 now, then 1 at bedtime Take the Diflucan if needed for yeast infection symptoms Make sure you drink plenty of water Follow-up with your primary care doctor as scheduled

## 2018-08-26 NOTE — ED Triage Notes (Signed)
Patient presents to Urgent Care with complaints of feeling like she has a UTI, has lower abdominal pressure and frequent burning urination since 3 days ago. Patient reports she had a PCP appt for June 13 but could not wait that long.

## 2018-08-27 LAB — URINE CULTURE

## 2018-09-04 ENCOUNTER — Ambulatory Visit (HOSPITAL_COMMUNITY)
Admission: EM | Admit: 2018-09-04 | Discharge: 2018-09-04 | Disposition: A | Discharge status: Home / Self Care | Payer: Medicaid Other | Attending: Urgent Care | Admitting: Urgent Care

## 2018-09-04 ENCOUNTER — Encounter (HOSPITAL_COMMUNITY): Payer: Self-pay

## 2018-09-04 DIAGNOSIS — G8929 Other chronic pain: Secondary | ICD-10-CM

## 2018-09-04 DIAGNOSIS — M549 Dorsalgia, unspecified: Secondary | ICD-10-CM

## 2018-09-04 MED ORDER — PREDNISONE 20 MG PO TABS: ORAL_TABLET | ORAL | 0 refills | Status: DC

## 2018-09-04 MED ORDER — KETOROLAC TROMETHAMINE 60 MG/2ML IM SOLN
INTRAMUSCULAR | Status: AC
  Filled 2018-09-04: qty 2

## 2018-09-04 MED ORDER — KETOROLAC TROMETHAMINE 60 MG/2ML IM SOLN
60.0000 mg | Freq: Once | INTRAMUSCULAR | Status: AC
  Administered 2018-09-04: 60 mg via INTRAMUSCULAR

## 2018-09-04 MED ORDER — TIZANIDINE HCL 4 MG PO TABS: 4.0000 mg | ORAL_TABLET | Freq: Four times a day (QID) | ORAL | 1 refills | Status: DC | PRN

## 2018-09-04 MED ORDER — FLUCONAZOLE 150 MG PO TABS: ORAL_TABLET | ORAL | 0 refills | Status: DC

## 2018-09-04 NOTE — Discharge Instructions (Signed)
[facility] Neurosurgeon in [place], [place] Address: [address], [place], [place] [address] Phone: [phone]

## 2018-09-04 NOTE — ED Triage Notes (Signed)
Pt c/o hx of back surgeries . C/o upper back/bilateral shoulder pain x3wks. Denies injury states does heavy lifting at work.
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
## 2018-09-13 ENCOUNTER — Ambulatory Visit
Admission: RE | Admit: 2018-09-13 | Discharge: 2018-09-13 | Disposition: A | Discharge status: Home / Self Care | Payer: Medicaid Other | Source: Ambulatory Visit | Attending: Specialist | Admitting: Specialist

## 2018-09-13 DIAGNOSIS — M542 Cervicalgia: Secondary | ICD-10-CM

## 2018-09-25 ENCOUNTER — Encounter: Payer: Self-pay | Admitting: Specialist

## 2018-09-25 ENCOUNTER — Ambulatory Visit (INDEPENDENT_AMBULATORY_CARE_PROVIDER_SITE_OTHER): Payer: Medicaid Other | Admitting: Specialist

## 2018-09-25 ENCOUNTER — Other Ambulatory Visit: Payer: Self-pay

## 2018-09-25 VITALS — BP 121/63 | HR 68 | Ht 60.0 in | Wt 239.0 lb

## 2018-09-25 DIAGNOSIS — Z4889 Encounter for other specified surgical aftercare: Secondary | ICD-10-CM

## 2018-09-25 DIAGNOSIS — M5010 Cervical disc disorder with radiculopathy, unspecified cervical region: Secondary | ICD-10-CM | POA: Diagnosis not present

## 2018-09-25 DIAGNOSIS — M7581 Other shoulder lesions, right shoulder: Secondary | ICD-10-CM | POA: Diagnosis not present

## 2018-09-25 DIAGNOSIS — M7582 Other shoulder lesions, left shoulder: Secondary | ICD-10-CM

## 2018-09-25 DIAGNOSIS — M4326 Fusion of spine, lumbar region: Secondary | ICD-10-CM

## 2018-09-25 DIAGNOSIS — M778 Other enthesopathies, not elsewhere classified: Secondary | ICD-10-CM

## 2018-09-25 MED ORDER — HYDROCODONE-ACETAMINOPHEN 5-325 MG PO TABS: 1.0000 | ORAL_TABLET | Freq: Four times a day (QID) | ORAL | 0 refills | Status: DC | PRN

## 2018-09-25 MED ORDER — IBUPROFEN 800 MG PO TABS: 800.0000 mg | ORAL_TABLET | Freq: Three times a day (TID) | ORAL | 3 refills | Status: DC | PRN

## 2018-09-25 NOTE — Progress Notes (Signed)
Office Visit Note   Patient: Yvonne [name]           Date of Birth: 09-13-1985           MRN: [id number] Visit Date: 09/25/2018              Requested by: [facility] [address] [place] [id number] PCP: [facility]   Assessment & Plan: Visit Diagnoses:  1. Herniation of cervical intervertebral disc with radiculopathy   2. Fusion of lumbar spine   3. Shoulder tendonitis, left   4. Right shoulder tendonitis   5. Encounter for other specified surgical aftercare     Plan: Avoid frequent bending and stooping  No lifting greater than 10 lbs. May use ice or moist heat for pain. Weight loss is of benefit. Best medication for lumbar disc disease is arthritis medications like motrin, celebrex and naprosyn. Exercise is important to improve your indurance and does allow people to function better inspite of back pain.  Avoid overhead lifting and overhead use of the arms. Do not lift greater than 5 lbs. Adjust head rest in vehicle to prevent hyperextension if rear ended. Take extra precautions to avoid falling   Avoid overhead lifting and overhead use of the arms. Do not lift greater than 10 lbs. Tylenol ES one every 6-8 hours for pain and inflamation. . Start PT for 4-6 weeks, then a home exercise program..  Follow-Up Instructions: Return in about 3 weeks (around 10/16/2018).   Orders:  Orders Placed This Encounter  Procedures  . MR Lumbar Spine W Wo Contrast  . Ambulatory referral to Physical Therapy   Meds ordered this encounter  Medications  . HYDROcodone-acetaminophen (NORCO/VICODIN) 5-325 MG tablet    Sig: Take 1 tablet by mouth every 6 (six) hours as needed for moderate pain.    Dispense:  30 tablet    Refill:  0  . ibuprofen (ADVIL) 800 MG tablet    Sig: Take 1 tablet (800 mg total) by mouth every 8 (eight) hours as needed for moderate pain.    Dispense:  90 tablet    Refill:  3      Procedures: No procedures  performed   Clinical Data: No additional findings.   Subjective: Chief Complaint  Patient presents with  . Neck - Follow-up    MRI review of CSP    33 year old right handed female with persistent neck and interscapular pain and pain into both shoulder. 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 Underwent recent  MRI and is still taking medications for pain. Complains of pain in the shoulders and also right leg pain the side all the way down to the ankle and it locks up.    Review of Systems   Objective: Vital Signs: BP 121/63 (BP Location: Left Arm, Patient Position: Sitting)   Pulse 68   Ht 5' (1.524 m)   Wt 239 lb (108.4 kg)   LMP 07/25/2012   BMI 46.68 kg/m   Physical Exam Constitutional:      Appearance: She is well-developed.  HENT:     Head: Normocephalic and atraumatic.  Eyes:     Pupils: Pupils are equal, round, and reactive to light.  Neck:     Musculoskeletal: Normal range of motion and neck supple.  Pulmonary:     Effort: Pulmonary effort is normal.     Breath sounds:  Normal breath sounds.  Abdominal:     General: Bowel sounds are normal.     Palpations: Abdomen is soft.  Skin:    General: Skin is warm and dry.  Neurological:     Mental Status: She is alert and oriented to person, place, and time.  Psychiatric:        Behavior: Behavior normal.        Thought Content: Thought content normal.        Judgment: Judgment normal.     Back Exam   Tenderness  The patient is experiencing tenderness in the cervical and lumbar.  Range of Motion  Extension: abnormal  Flexion: abnormal  Lateral bend right: abnormal  Lateral bend left: abnormal  Rotation right: abnormal  Rotation left: abnormal   Muscle Strength  Right Quadriceps:  5/5  Left Quadriceps:  5/5  Right Hamstrings:  5/5  Left Hamstrings:  5/5   Tests  Straight leg raise right: positive Straight leg raise left:  positive  Reflexes  Patellar: normal Achilles: normal Biceps: normal Babinski's sign: normal   Other  Toe walk: normal Heel walk: normal      Specialty Comments:  No specialty comments available.  Imaging: No results found.   PMFS History: Patient Active Problem List   Diagnosis Date Noted  . HNP (herniated nucleus pulposus), lumbar 11/05/2014    Priority: High    Class: Chronic  . S/P lumbar spinal fusion 02/13/2017  . Herniated nucleus pulposus, lumbar 04/10/2016  . S/P endometrial ablation - HTA 2012 06/03/2012  . Sterilization - h/o BTL 06/03/2012  . OBESITY 09/03/2007  . MIGRAINE HEADACHE 09/03/2007   Past Medical History:  Diagnosis Date  . Anemia   . Anxiety   . 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 Asthma    albuterol inhaler  . Blood transfusion    2008 with c/s 4 units transfused  . Carpal tunnel syndrome on right   . Chronic back pain   . Depression   . Dyspnea    exertion  . Headache(784.0)    otc ibuprofen 800mg   . Heart murmur    as a child  . Obesity   . Pneumonia    as a child    Family History  Problem Relation Age of Onset  . Diabetes Mother   . Diabetes Maternal Grandmother   . Stroke Father     Past Surgical History:  Procedure Laterality Date  . ABDOMINAL HYSTERECTOMY N/A 09/29/2012   Procedure: HYSTERECTOMY ABDOMINAL;  Surgeon: Delice Lesch, MD;  Location: [facility];  Service: Gynecology;  Laterality: N/A;  . APPENDECTOMY    . BACK SURGERY    . BILATERAL SALPINGECTOMY Bilateral 09/29/2012   Procedure: BILATERAL SALPINGECTOMY;  Surgeon: Delice Lesch, MD;  Location: [facility];  Service: Gynecology;  Laterality: Bilateral;  . CESAREAN SECTION    . CHOLECYSTECTOMY    . CYSTOSCOPY Bilateral 09/29/2012   Procedure: CYSTOSCOPY;  Surgeon: Delice Lesch, MD;  Location: [facility];  Service: Gynecology;  Laterality: Bilateral;  . ENDOMETRIAL ABLATION    . EXAMINATION UNDER ANESTHESIA N/A 10/16/2012    Procedure: EXAM UNDER ANESTHESIA;  Surgeon: Delice Lesch, MD;  Location: [facility];  Service: Gynecology;  Laterality: N/A;  Suturing Vaginal Cuff  . HAND SURGERY Right    carpal tunnel  . LAPAROSCOPIC ASSISTED VAGINAL HYSTERECTOMY N/A 09/29/2012   Procedure: LAPAROSCOPIC ASSISTED VAGINAL HYSTERECTOMY;  Surgeon: Harvie Bridge  Su Hiltoberts, MD;  Location: [facility];  Service: Gynecology;  Laterality: N/A;  . LAPAROSCOPIC HYSTERECTOMY Bilateral 09/29/2012   Procedure: HYSTERECTOMY TOTAL LAPAROSCOPIC;  Surgeon: Purcell NailsAngela Y Roberts, MD;  Location: [facility];  Service: Gynecology;  Laterality: Bilateral;  Total Laparoscopic Hysterectomy, Bilateral Salpingectomies, Cystoscopy poss. LAVH poss. TAH  . LAPAROSCOPY     x3  . LUMBAR LAMINECTOMY N/A 11/05/2014   Procedure: RIGHT L4-5 MICRODISCECTOMY ;  Surgeon: Kerrin ChampagneJames E Nitka, MD;  Location: [facility];  Service: Orthopedics;  Laterality: N/A;  . LUMBAR LAMINECTOMY/DECOMPRESSION MICRODISCECTOMY N/A 04/10/2016   Procedure: Right L4-5 Microdiscectomy;  Surgeon: Kerrin ChampagneJames E Nitka, MD;  Location: [facility];  Service: Orthopedics;  Laterality: N/A;  . TONSILLECTOMY     T & A  . TRANSFORAMINAL LUMBAR INTERBODY FUSION (TLIF) WITH PEDICLE SCREW FIXATION 1 LEVEL N/A 02/13/2017   Procedure: Transforaminal Lumbar Interbody Fusion - Lumbar four-five;  Surgeon: Tia AlertJones, David S, MD;  [facility];  Service: Neurosurgery;  Laterality: N/A;  . TUBAL LIGATION     Social History   Occupational History  . Not on file  Tobacco Use  . Smoking status: Former Smoker    Packs/day: 0.00    Types: Cigarettes  . Smokeless tobacco: Never Used  . Tobacco comment: 1-2 cigarettes a day  Substance and Sexual Activity  . Alcohol use: Yes    Alcohol/week: 1.0 standard drinks    Types: 1 Glasses of wine per week    Comment: ocassionally  . Drug use: No  . Sexual activity: Yes    Birth control/protection: Surgical, Condom    Comment: surgical tubal

## 2018-09-25 NOTE — Patient Instructions (Addendum)
Avoid frequent bending and stooping  No lifting greater than 10 lbs. May use ice or moist heat for pain. Weight loss is of benefit. Best medication for lumbar disc disease is arthritis medications like motrin, celebrex and naprosyn. Exercise is important to improve your indurance and does allow people to function better inspite of back pain.  Avoid overhead lifting and overhead use of the arms. Do not lift greater than 5 lbs. Adjust head rest in vehicle to prevent hyperextension if rear ended. Take extra precautions to avoid falling   Avoid overhead lifting and overhead use of the arms. Do not lift greater than 10 lbs. Tylenol ES one every 6-8 hours for pain and inflamation. . Start PT for 4-6 weeks, then a home exercise program. MRI of the lumbar spine to assess for cause of ongoing sciatica

## 2018-10-07 ENCOUNTER — Ambulatory Visit: Payer: Medicaid Other | Attending: Specialist | Admitting: Physical Therapy

## 2018-10-17 ENCOUNTER — Other Ambulatory Visit: Payer: Self-pay

## 2018-10-17 ENCOUNTER — Ambulatory Visit (HOSPITAL_COMMUNITY)
Admission: EM | Admit: 2018-10-17 | Discharge: 2018-10-17 | Disposition: A | Discharge status: Home / Self Care | Payer: Medicaid Other | Attending: Emergency Medicine | Admitting: Emergency Medicine

## 2018-10-17 ENCOUNTER — Encounter (HOSPITAL_COMMUNITY): Payer: Self-pay

## 2018-10-17 DIAGNOSIS — Z202 Contact with and (suspected) exposure to infections with a predominantly sexual mode of transmission: Secondary | ICD-10-CM

## 2018-10-17 DIAGNOSIS — R600 Localized edema: Secondary | ICD-10-CM

## 2018-10-17 MED ORDER — CEFTRIAXONE SODIUM 250 MG IJ SOLR
INTRAMUSCULAR | Status: AC
  Filled 2018-10-17: qty 250

## 2018-10-17 MED ORDER — METRONIDAZOLE 500 MG PO TABS: 500.0000 mg | ORAL_TABLET | Freq: Two times a day (BID) | ORAL | 0 refills | Status: DC

## 2018-10-17 MED ORDER — CEFTRIAXONE SODIUM 250 MG IJ SOLR
250.0000 mg | Freq: Once | INTRAMUSCULAR | Status: AC
  Administered 2018-10-17: 250 mg via INTRAMUSCULAR

## 2018-10-17 MED ORDER — AZITHROMYCIN 250 MG PO TABS
ORAL_TABLET | ORAL | Status: AC
  Filled 2018-10-17: qty 4

## 2018-10-17 MED ORDER — AZITHROMYCIN 250 MG PO TABS
1000.0000 mg | ORAL_TABLET | Freq: Once | ORAL | Status: AC
  Administered 2018-10-17: 1000 mg via ORAL

## 2018-10-17 NOTE — Discharge Instructions (Signed)
You were treated today with 2 antibiotics, Rocephin and Zithromax.  Also take the prescribed antibiotic metronidazole as prescribed twice daily for 7 days.    Your tests for STDs have been sent; we will call you if anything comes back positive.  Do not have sex for 7 days.  If your tests are positive, your sexual partner will need to be treated.    Return here or go to the emergency department if you develop pelvic pain, abdominal pain, fever, chills, difficulty with urination.    The swelling in both of your ankles does not appear to be gout.  Cut back on your salt intake and elevate your legs as you can.  Follow-up with your primary care provider in 1 week.

## 2018-10-17 NOTE — ED Provider Notes (Signed)
[facility]    CSN: [id number] Arrival date & time: 10/17/18  1057     History   Chief Complaint Chief Complaint  Patient presents with  . Vaginitis    HPI Yvonne [name] is a 33 y.o. female.   Patient presents with acute bacterial vaginosis.  She states she has a fishy odor from her vagina and has been treated for this in the past.  She denies vaginal discharge, pelvic pain, abdominal pain, fever, chills, dysuria.  She also reports bilateral ankle swelling for the last couple of weeks and wants to be checked for gout.  LMP: hysterectomy.   The history is provided by the patient.    Past Medical History:  Diagnosis Date  . Anemia   . Anxiety   . 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 Asthma    albuterol inhaler  . Blood transfusion    2008 with c/s 4 units transfused  . Carpal tunnel syndrome on right   . Chronic back pain   . Depression   . Dyspnea    exertion  . Headache(784.0)    otc ibuprofen 800mg   . Heart murmur    as a child  . Obesity   . Pneumonia    as a child    Patient Active Problem List   Diagnosis Date Noted  . S/P lumbar spinal fusion 02/13/2017  . Herniated nucleus pulposus, lumbar 04/10/2016  . HNP (herniated nucleus pulposus), lumbar 11/05/2014    Class: Chronic  . S/P endometrial ablation - HTA 2012 06/03/2012  . Sterilization - h/o BTL 06/03/2012  . OBESITY 09/03/2007  . MIGRAINE HEADACHE 09/03/2007    Past Surgical History:  Procedure Laterality Date  . ABDOMINAL HYSTERECTOMY N/A 09/29/2012   Procedure: HYSTERECTOMY ABDOMINAL;  Surgeon: Purcell NailsAngela Y Roberts, MD;  Location: [facility];  Service: Gynecology;  Laterality: N/A;  . APPENDECTOMY    . BACK SURGERY    . BILATERAL SALPINGECTOMY Bilateral 09/29/2012   Procedure: BILATERAL SALPINGECTOMY;  Surgeon: Purcell NailsAngela Y Roberts, MD;  Location: [facility];  Service: Gynecology;  Laterality: Bilateral;  . CESAREAN SECTION    . CHOLECYSTECTOMY    . CYSTOSCOPY  Bilateral 09/29/2012   Procedure: CYSTOSCOPY;  Surgeon: Purcell NailsAngela Y Roberts, MD;  Location: [facility];  Service: Gynecology;  Laterality: Bilateral;  . ENDOMETRIAL ABLATION    . EXAMINATION UNDER ANESTHESIA N/A 10/16/2012   Procedure: EXAM UNDER ANESTHESIA;  Surgeon: Purcell NailsAngela Y Roberts, MD;  Location: [facility];  Service: Gynecology;  Laterality: N/A;  Suturing Vaginal Cuff  . HAND SURGERY Right    carpal tunnel  . LAPAROSCOPIC ASSISTED VAGINAL HYSTERECTOMY N/A 09/29/2012   Procedure: LAPAROSCOPIC ASSISTED VAGINAL HYSTERECTOMY;  Surgeon: Purcell NailsAngela Y Roberts, MD;  Location: [facility];  Service: Gynecology;  Laterality: N/A;  . LAPAROSCOPIC HYSTERECTOMY Bilateral 09/29/2012   Procedure: HYSTERECTOMY TOTAL LAPAROSCOPIC;  Surgeon: Purcell NailsAngela Y Roberts, MD;  Location: [facility];  Service: Gynecology;  Laterality: Bilateral;  Total Laparoscopic Hysterectomy, Bilateral Salpingectomies, Cystoscopy poss. LAVH poss. TAH  . LAPAROSCOPY     x3  . LUMBAR LAMINECTOMY N/A 11/05/2014   Procedure: RIGHT L4-5 MICRODISCECTOMY ;  Surgeon: Kerrin ChampagneJames E Nitka, MD;  [facility];  Service: Orthopedics;  Laterality: N/A;  . LUMBAR LAMINECTOMY/DECOMPRESSION MICRODISCECTOMY N/A 04/10/2016   Procedure: Right L4-5 Microdiscectomy;  Surgeon: Kerrin ChampagneJames E Nitka, MD;  [facility];  Service: Orthopedics;  Laterality: N/A;  . TONSILLECTOMY     T &  A  . TRANSFORAMINAL LUMBAR INTERBODY FUSION (TLIF) WITH PEDICLE SCREW FIXATION 1 LEVEL N/A 02/13/2017   Procedure: Transforaminal Lumbar Interbody Fusion - Lumbar four-five;  Surgeon: Eustace Moore, MD;  Location: [facility];  Service: Neurosurgery;  Laterality: N/A;  . TUBAL LIGATION      OB History    Gravida  4   Para  4   Term  4   Preterm      AB      Living  1     SAB      TAB      Ectopic      Multiple      Live Births  1            Home Medications    Prior to Admission medications   Medication Sig Start Date End Date Taking? Authorizing Provider  ALPRAZolam Duanne Moron) 0.5 MG  tablet Take one tablet po at the MRI. 08/06/18   Jessy Oto, MD  fluconazole (DIFLUCAN) 150 MG tablet Take 1 tablet weekly. 09/04/18   Jaynee Eagles, PA-C  HYDROcodone-acetaminophen (NORCO/VICODIN) 5-325 MG tablet Take 1 tablet by mouth every 6 (six) hours as needed for moderate pain. 09/25/18   Jessy Oto, MD  ibuprofen (ADVIL) 800 MG tablet Take 1 tablet (800 mg total) by mouth every 8 (eight) hours as needed for moderate pain. 09/25/18   Jessy Oto, MD  metroNIDAZOLE (FLAGYL) 500 MG tablet Take 1 tablet (500 mg total) by mouth 2 (two) times daily. 10/17/18   Sharion Balloon, NP  nitrofurantoin, macrocrystal-monohydrate, (MACROBID) 100 MG capsule Take 1 capsule (100 mg total) by mouth 2 (two) times daily. 08/26/18   Raylene Everts, MD  predniSONE (DELTASONE) 20 MG tablet Day 1-5: Take 2 tablets daily with breakfast. Day 6-10: Take 1 tablet daily with breakfast. 09/04/18   Jaynee Eagles, PA-C  QUEtiapine (SEROQUEL) 300 MG tablet Take 300 mg by mouth at bedtime.    [provider]  tiZANidine (ZANAFLEX) 4 MG tablet Take 1 tablet (4 mg total) by mouth every 6 (six) hours as needed for muscle spasms. 09/04/18   Jaynee Eagles, PA-C    Family History Family History  Problem Relation Age of Onset  . Diabetes Mother   . Diabetes Maternal Grandmother   . Stroke Father     Social History Social History   Tobacco Use  . Smoking status: Former Smoker    Packs/day: 0.00    Types: Cigarettes    Quit date: 10/17/2015    Years since quitting: 3.0  . Smokeless tobacco: Never Used  . Tobacco comment: 1-2 cigarettes a day  Substance Use Topics  . Alcohol use: Yes    Alcohol/week: 1.0 standard drinks    Types: 1 Glasses of wine per week    Comment: ocassionally  . Drug use: No     Allergies   Bee venom, Reglan [metoclopramide hcl], and Aspirin   Review of Systems Review of Systems  Constitutional: Negative for chills and fever.  HENT: Negative for ear pain and sore throat.   Eyes:  Negative for pain and visual disturbance.  Respiratory: Negative for cough and shortness of breath.   Cardiovascular: Negative for chest pain and palpitations.  Gastrointestinal: Negative for abdominal pain, diarrhea and vomiting.  Genitourinary: Negative for dysuria, flank pain, hematuria, pelvic pain and vaginal discharge.  Musculoskeletal: Positive for joint swelling. Negative for arthralgias, back pain, gait problem and myalgias.  Skin: Negative for color change and rash.  Neurological: Negative for seizures, syncope, weakness and numbness.  All other systems reviewed and are negative.    Physical Exam Triage Vital Signs ED Triage Vitals  Enc Vitals Group     BP 10/17/18 1208 134/76     Pulse Rate 10/17/18 1208 74     Resp 10/17/18 1208 16     Temp 10/17/18 1208 98.5 F (36.9 C)     Temp Source 10/17/18 1208 Oral     SpO2 10/17/18 1208 100 %     Weight --      Height --      Head Circumference --      Peak Flow --      Pain Score 10/17/18 1206 0     Pain Loc --      Pain Edu? --      Excl. in GC? --    No data found.  Updated Vital Signs BP 134/76 (BP Location: Right Arm)   Pulse 74   Temp 98.5 F (36.9 C) (Oral)   Resp 16   LMP 07/25/2012   SpO2 100%   Visual Acuity Right Eye Distance:   Left Eye Distance:   Bilateral Distance:    Right Eye Near:   Left Eye Near:    Bilateral Near:     Physical Exam Vitals signs and nursing note reviewed.  Constitutional:      General: She is not in acute distress.    Appearance: She is well-developed.  HENT:     Head: Normocephalic and atraumatic.  Eyes:     Conjunctiva/sclera: Conjunctivae normal.  Neck:     Musculoskeletal: Neck supple.  Cardiovascular:     Rate and Rhythm: Normal rate and regular rhythm.     Heart sounds: Normal heart sounds.  Pulmonary:     Effort: Pulmonary effort is normal. No respiratory distress.     Breath sounds: Normal breath sounds.  Abdominal:     Palpations: Abdomen is soft.      Tenderness: There is no abdominal tenderness. There is no right CVA tenderness, left CVA tenderness, guarding or rebound.  Musculoskeletal: Normal range of motion.        General: No tenderness, deformity or signs of injury.     Right lower leg: Edema present.     Left lower leg: Edema present.     Comments: Trace bilateral pedal edema.   Skin:    General: Skin is warm and dry.     Findings: No bruising, erythema, lesion or rash.  Neurological:     Mental Status: She is alert.      UC Treatments / Results  Labs (all labs ordered are listed, but only abnormal results are displayed) Labs Reviewed  CERVICOVAGINAL ANCILLARY ONLY    EKG   Radiology No results found.  Procedures Procedures (including critical care time)  Medications Ordered in UC Medications  cefTRIAXone (ROCEPHIN) injection 250 mg (250 mg Intramuscular Given 10/17/18 1232)  azithromycin (ZITHROMAX) tablet 1,000 mg (1,000 mg Oral Given 10/17/18 1232)  cefTRIAXone (ROCEPHIN) 250 MG injection (has no administration in time range)  azithromycin (ZITHROMAX) 250 MG tablet (has no administration in time range)    Initial Impression / Assessment and Plan / UC Course  I have reviewed the triage vital signs and the nursing notes.  Pertinent labs & imaging results that were available during my care of the patient were reviewed by me and considered in my medical decision making (see chart for details).   Potential exposure to  STD.  Treated today with Rocephin and Zithromax; prescription for metronidazole.  Vaginal swab sent for gonorrhea, chlamydia, trichomonas, bacterial vaginosis.  Instructed patient to abstain from sex x7 days.  Discussed that her sexual partner will need treatment if her tests come back positive.   Bilateral lower extremity trace edema.  Discussed with patient that she should elevate her lower extremities as she is able and decrease her salt intake.  Discussed that she should follow-up with her primary  care provider in 1 week.   Discussed with patient that she should return here or go to the emergency department if she develops fever, chills, pelvic pain, abdominal pain, dysuria.     Final Clinical Impressions(s) / UC Diagnoses   Final diagnoses:  Potential exposure to STD  Bilateral leg edema     Discharge Instructions     You were treated today with 2 antibiotics, Rocephin and Zithromax.  Also take the prescribed antibiotic metronidazole as prescribed twice daily for 7 days.    Your tests for STDs have been sent; we will call you if anything comes back positive.  Do not have sex for 7 days.  If your tests are positive, your sexual partner will need to be treated.    Return here or go to the emergency department if you develop pelvic pain, abdominal pain, fever, chills, difficulty with urination.    The swelling in both of your ankles does not appear to be gout.  Cut back on your salt intake and elevate your legs as you can.  Follow-up with your primary care provider in 1 week.        ED Prescriptions    Medication Sig Dispense Auth. Provider   metroNIDAZOLE (FLAGYL) 500 MG tablet Take 1 tablet (500 mg total) by mouth 2 (two) times daily. 14 tablet Mickie Bailate, Alima Naser H, NP     Controlled Substance Prescriptions  Controlled Substance Registry consulted? Not Applicable   Mickie Bailate, Lister Brizzi H, NP 10/17/18 1316

## 2018-10-17 NOTE — ED Triage Notes (Signed)
Patient presents to Urgent Care with complaints of vaginal infection since a few days ago. Patient reports she has been having ankle swelling for approx 10 days, her mother thinks she has gout.

## 2018-10-20 LAB — CERVICOVAGINAL ANCILLARY ONLY
Bacterial vaginitis: POSITIVE — AB
Chlamydia: NEGATIVE
Neisseria Gonorrhea: POSITIVE — AB
Trichomonas: POSITIVE — AB

## 2018-10-21 ENCOUNTER — Other Ambulatory Visit: Payer: Medicaid Other

## 2018-10-22 ENCOUNTER — Telehealth: Payer: Self-pay | Admitting: Emergency Medicine

## 2018-10-22 NOTE — Telephone Encounter (Signed)
Bacterial Vaginosis test is positive.  Prescription for metronidazole was given at the urgent care visit. Pt contacted regarding results. Answered all questions. Verbalized understanding.  Test for gonorrhea was positive. This was treated at the urgent care visit with IM rocephin 250mg  and po zithromax 1g. Pt needs education to refrain from sexual intercourse for 7 days after treatment to give the medicine time to work. Sexual partners need to be notified and tested/treated. Condoms may reduce risk of reinfection. Recheck or followup with PCP for further evaluation if symptoms are not improving. [facility] notified.   Trichomonas is positive. Rx metronidazole was given at the urgent care visit. Pt needs education to please refrain from sexual intercourse for 7 days to give the medicine time to work. Sexual partners need to be notified and tested/treated. Condoms may reduce risk of reinfection. Recheck for further evaluation if symptoms are not improving.   Patient contacted and made aware of all results, all questions answered.

## 2018-10-29 ENCOUNTER — Ambulatory Visit: Payer: Medicaid Other | Admitting: Specialist

## 2018-10-31 ENCOUNTER — Other Ambulatory Visit: Payer: Medicaid Other

## 2018-11-04 ENCOUNTER — Other Ambulatory Visit: Payer: Self-pay | Admitting: Specialist

## 2018-11-04 NOTE — Telephone Encounter (Signed)
Sent request to Dr. Nitka 

## 2018-11-04 NOTE — Telephone Encounter (Signed)
Rx refill Hydrocodone & Tizanidine [facility] @ [address]   Pt's call back# [phone]

## 2018-11-05 ENCOUNTER — Other Ambulatory Visit: Payer: Self-pay | Admitting: Specialist

## 2018-11-05 MED ORDER — HYDROCODONE-ACETAMINOPHEN 5-325 MG PO TABS: 1.0000 | ORAL_TABLET | Freq: Four times a day (QID) | ORAL | 0 refills | Status: DC | PRN

## 2018-11-05 MED ORDER — TIZANIDINE HCL 4 MG PO TABS: 4.0000 mg | ORAL_TABLET | Freq: Four times a day (QID) | ORAL | 1 refills | Status: DC | PRN

## 2018-11-21 ENCOUNTER — Telehealth: Payer: Self-pay | Admitting: *Deleted

## 2018-11-21 NOTE — Telephone Encounter (Signed)
Received vm from [place] with [facility] stating pt is scheduled to come in for MRI lumbar spine and she sees that it has been denied by her insurance, I called Jasmine back to inform her that I had to do a new case # and this has been aproved.

## 2018-11-24 ENCOUNTER — Other Ambulatory Visit: Payer: Self-pay

## 2018-11-24 ENCOUNTER — Ambulatory Visit: Payer: Medicaid Other | Admitting: Specialist

## 2018-11-24 ENCOUNTER — Ambulatory Visit
Admission: RE | Admit: 2018-11-24 | Discharge: 2018-11-24 | Disposition: A | Discharge status: Home / Self Care | Payer: Medicaid Other | Source: Ambulatory Visit | Attending: Specialist | Admitting: Specialist

## 2018-11-24 DIAGNOSIS — Z4889 Encounter for other specified surgical aftercare: Secondary | ICD-10-CM

## 2018-11-24 MED ORDER — GADOBENATE DIMEGLUMINE 529 MG/ML IV SOLN
20.0000 mL | Freq: Once | INTRAVENOUS | Status: AC | PRN
  Administered 2018-11-24: 20 mL via INTRAVENOUS

## 2018-12-04 ENCOUNTER — Other Ambulatory Visit: Payer: Self-pay | Admitting: Specialist

## 2018-12-04 ENCOUNTER — Ambulatory Visit: Payer: Medicaid Other | Admitting: Specialist

## 2018-12-04 NOTE — Telephone Encounter (Signed)
Patient called needing Rx refilled (Hydrocodone) The number to contact patient is [phone]

## 2018-12-04 NOTE — Telephone Encounter (Signed)
Sent request to Dr. Nitka 

## 2018-12-09 MED ORDER — HYDROCODONE-ACETAMINOPHEN 5-325 MG PO TABS: 1.0000 | ORAL_TABLET | Freq: Four times a day (QID) | ORAL | 0 refills | Status: DC | PRN

## 2018-12-22 ENCOUNTER — Other Ambulatory Visit: Payer: Self-pay | Admitting: Specialist

## 2018-12-22 NOTE — Telephone Encounter (Signed)
Patient called. Would like a refill on Hydrocodone. Her call back number is [phone]. Thanks

## 2018-12-23 MED ORDER — HYDROCODONE-ACETAMINOPHEN 5-325 MG PO TABS: 1.0000 | ORAL_TABLET | Freq: Four times a day (QID) | ORAL | 0 refills | Status: DC | PRN

## 2018-12-25 ENCOUNTER — Encounter: Payer: Self-pay | Admitting: Specialist

## 2018-12-25 ENCOUNTER — Ambulatory Visit (INDEPENDENT_AMBULATORY_CARE_PROVIDER_SITE_OTHER): Payer: Medicaid Other | Admitting: Specialist

## 2018-12-25 ENCOUNTER — Other Ambulatory Visit: Payer: Self-pay

## 2018-12-25 ENCOUNTER — Ambulatory Visit: Payer: Self-pay

## 2018-12-25 VITALS — BP 117/70 | HR 79 | Ht 60.0 in | Wt 239.0 lb

## 2018-12-25 DIAGNOSIS — S46912A Strain of unspecified muscle, fascia and tendon at shoulder and upper arm level, left arm, initial encounter: Secondary | ICD-10-CM

## 2018-12-25 DIAGNOSIS — S40012A Contusion of left shoulder, initial encounter: Secondary | ICD-10-CM

## 2018-12-25 DIAGNOSIS — M5020 Other cervical disc displacement, unspecified cervical region: Secondary | ICD-10-CM

## 2018-12-25 DIAGNOSIS — M5136 Other intervertebral disc degeneration, lumbar region: Secondary | ICD-10-CM

## 2018-12-25 DIAGNOSIS — M7582 Other shoulder lesions, left shoulder: Secondary | ICD-10-CM | POA: Diagnosis not present

## 2018-12-25 DIAGNOSIS — M778 Other enthesopathies, not elsewhere classified: Secondary | ICD-10-CM

## 2018-12-25 MED ORDER — METAXALONE 800 MG PO TABS: 800.0000 mg | ORAL_TABLET | Freq: Three times a day (TID) | ORAL | 0 refills | Status: DC

## 2018-12-25 NOTE — Patient Instructions (Signed)
Avoid frequent bending and stooping  No lifting greater than 10 lbs. May use ice or moist heat for pain. Weight loss is of benefit. Best medication for lumbar disc disease is arthritis medications like motrin 800 mg TID. Skelaxin for muscular pain due to left shoulder strain and contusion.   Exercise is important to improve your indurance and does allow people to function better inspite of back pain.

## 2018-12-25 NOTE — Progress Notes (Signed)
Office Visit Note   Patient: Yvonne [name]           Date of Birth: [birth year]           MRN: [id number] Visit Date: 12/25/2018              Requested by: [facility] [address] [place],  [place] [address] PCP: [facility]   Assessment & Plan: Visit Diagnoses:  1. Shoulder tendonitis, left   2. Contusion of left shoulder, initial encounter   3. Muscle strain of left shoulder, initial encounter   4. Herniation of cervical intervertebral disc   5. Degenerative disc disease, lumbar     Plan: Avoid frequent bending and stooping  No lifting greater than 10 lbs. May use ice or moist heat for pain. Weight loss is of benefit. Best medication for lumbar disc disease is arthritis medications like motrin 800 mg TID. Skelaxin for muscular pain due to left shoulder strain and contusion.   Exercise is important to improve your indurance and does allow people to function better inspite of back pain.  Follow-Up Instructions: Return in about 4 weeks (around 01/22/2019).   Orders:  Orders Placed This Encounter  Procedures  . XR Shoulder Left   No orders of the defined types were placed in this encounter.     Procedures: No procedures performed   Clinical Data: No additional findings.   Subjective: Chief Complaint  Patient presents with  . Left Shoulder - Injury  . Neck - Follow-up    MRI review    33 year old female seen in the office today with her daughter, fell 3 days ago landing on the left shoulder. She tripped over her baby carriage landing on the left shoulder with pain and diffucutly raising the left arm and lifting. She was carrying her 64 month old God child and tried to avoid injury to her child. She also wants to know what the results of the most recent  MRI. Most of her back pain is in her upper back between the shoulder blades and since the fall she has had a burning sensation in the left shoulder. It is hurting a little in  the lower back and into the left side. With the fall recently all the pain has shifted to the left shoulder.    Review of Systems   Objective: Vital Signs: BP 117/70 (BP Location: Left Arm, Patient Position: Sitting)   Pulse 79   Ht 5' (1.524 m)   Wt 239 lb (108.4 kg)   LMP 07/25/2012   BMI 46.68 kg/m   Physical Exam  Ortho Exam  Specialty Comments:  No specialty comments available.  Imaging: Xr Shoulder Left  Result Date: 12/25/2018 AP and lateral and outlet veiw of the left shoulder show no fracture of dislocation or subluxation, SAS is well maintained. No abnormality    PMFS History: Patient Active Problem List   Diagnosis Date Noted  . HNP (herniated nucleus pulposus), lumbar 11/05/2014    Priority: High    Class: Chronic  . S/P lumbar spinal fusion 02/13/2017  . Herniated nucleus pulposus, lumbar 04/10/2016  . S/P endometrial ablation - HTA 2012 06/03/2012  . Sterilization - h/o BTL 06/03/2012  . OBESITY 09/03/2007  . MIGRAINE HEADACHE 09/03/2007   Past Medical History:  Diagnosis Date  . Anemia   . Anxiety   . 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  Asthma    albuterol inhaler  . Blood transfusion    2008 with c/s 4 units transfused  . Carpal tunnel syndrome on right   . Chronic back pain   . Depression   . Dyspnea    exertion  . Headache(784.0)    otc ibuprofen 800mg   . Heart murmur    as a child  . Obesity   . Pneumonia    as a child    Family History  Problem Relation Age of Onset  . Diabetes Mother   . Diabetes Maternal Grandmother   . Stroke Father     Past Surgical History:  Procedure Laterality Date  . ABDOMINAL HYSTERECTOMY N/A 09/29/2012   Procedure: HYSTERECTOMY ABDOMINAL;  Surgeon: Delice Lesch, MD;  Location: [facility];  Service: Gynecology;  Laterality: N/A;  . APPENDECTOMY    . BACK SURGERY    . BILATERAL SALPINGECTOMY Bilateral 09/29/2012   Procedure: BILATERAL SALPINGECTOMY;  Surgeon: Delice Lesch, MD;  Location: [facility];  Service: Gynecology;  Laterality: Bilateral;  . CESAREAN SECTION    . CHOLECYSTECTOMY    . CYSTOSCOPY Bilateral 09/29/2012   Procedure: CYSTOSCOPY;  Surgeon: Delice Lesch, MD;  Location: [facility];  Service: Gynecology;  Laterality: Bilateral;  . ENDOMETRIAL ABLATION    . EXAMINATION UNDER ANESTHESIA N/A 10/16/2012   Procedure: EXAM UNDER ANESTHESIA;  Surgeon: Delice Lesch, MD;  Location: [facility];  Service: Gynecology;  Laterality: N/A;  Suturing Vaginal Cuff  . HAND SURGERY Right    carpal tunnel  . LAPAROSCOPIC ASSISTED VAGINAL HYSTERECTOMY N/A 09/29/2012   Procedure: LAPAROSCOPIC ASSISTED VAGINAL HYSTERECTOMY;  Surgeon: Delice Lesch, MD;  Location: [facility];  Service: Gynecology;  Laterality: N/A;  . LAPAROSCOPIC HYSTERECTOMY Bilateral 09/29/2012   Procedure: HYSTERECTOMY TOTAL LAPAROSCOPIC;  Surgeon: Delice Lesch, MD;  Location: [facility];  Service: Gynecology;  Laterality: Bilateral;  Total Laparoscopic Hysterectomy, Bilateral Salpingectomies, Cystoscopy poss. LAVH poss. TAH  . LAPAROSCOPY     x3  . LUMBAR LAMINECTOMY N/A 11/05/2014   Procedure: RIGHT L4-5 MICRODISCECTOMY ;  Surgeon: Jessy Oto, MD;  Location: [facility];  Service: Orthopedics;  Laterality: N/A;  . LUMBAR LAMINECTOMY/DECOMPRESSION MICRODISCECTOMY N/A 04/10/2016   Procedure: Right L4-5 Microdiscectomy;  Surgeon: Jessy Oto, MD;  Location: [facility];  Service: Orthopedics;  Laterality: N/A;  . TONSILLECTOMY     T & A  . TRANSFORAMINAL LUMBAR INTERBODY FUSION (TLIF) WITH PEDICLE SCREW FIXATION 1 LEVEL N/A 02/13/2017   Procedure: Transforaminal Lumbar Interbody Fusion - Lumbar four-five;  Surgeon: Eustace Moore, MD;  Location: [facility];  Service: Neurosurgery;  Laterality: N/A;  . TUBAL LIGATION     Social History   Occupational History  . Not on file  Tobacco Use  . Smoking status: Former Smoker    Packs/day: 0.00    Types: Cigarettes    Quit date: 10/17/2015    Years since quitting:  3.1  . Smokeless tobacco: Never Used  . Tobacco comment: 1-2 cigarettes a day  Substance and Sexual Activity  . Alcohol use: Yes    Alcohol/week: 1.0 standard drinks    Types: 1 Glasses of wine per week    Comment: ocassionally  . Drug use: No  . Sexual activity: Yes    Birth control/protection: Surgical, Condom    Comment: surgical tubal

## 2019-01-01 ENCOUNTER — Ambulatory Visit: Payer: Medicaid Other | Admitting: Specialist

## 2019-01-05 ENCOUNTER — Ambulatory Visit: Payer: Medicaid Other | Attending: Specialist | Admitting: Physical Therapy

## 2019-01-05 DIAGNOSIS — M25512 Pain in left shoulder: Secondary | ICD-10-CM | POA: Insufficient documentation

## 2019-01-05 DIAGNOSIS — M542 Cervicalgia: Secondary | ICD-10-CM | POA: Insufficient documentation

## 2019-01-07 ENCOUNTER — Ambulatory Visit: Payer: Medicaid Other | Admitting: Physical Therapy

## 2019-01-07 ENCOUNTER — Ambulatory Visit: Payer: Medicaid Other | Admitting: Orthopedic Surgery

## 2019-01-09 ENCOUNTER — Other Ambulatory Visit: Payer: Self-pay | Admitting: Specialist

## 2019-01-09 NOTE — Telephone Encounter (Signed)
Patient called. Says she needs a new RX put in for PT. She has no showed 2x so they need a new referral put in. Also she needs a refill on her hydrocodone and tizanidine. Her call back number is [phone]

## 2019-01-12 ENCOUNTER — Other Ambulatory Visit: Payer: Self-pay | Admitting: Specialist

## 2019-01-12 ENCOUNTER — Other Ambulatory Visit: Payer: Self-pay | Admitting: Radiology

## 2019-01-12 DIAGNOSIS — S46912A Strain of unspecified muscle, fascia and tendon at shoulder and upper arm level, left arm, initial encounter: Secondary | ICD-10-CM

## 2019-01-12 DIAGNOSIS — M778 Other enthesopathies, not elsewhere classified: Secondary | ICD-10-CM

## 2019-01-12 DIAGNOSIS — M5010 Cervical disc disorder with radiculopathy, unspecified cervical region: Secondary | ICD-10-CM

## 2019-01-12 DIAGNOSIS — M5020 Other cervical disc displacement, unspecified cervical region: Secondary | ICD-10-CM

## 2019-01-12 DIAGNOSIS — S40012A Contusion of left shoulder, initial encounter: Secondary | ICD-10-CM

## 2019-01-12 DIAGNOSIS — M7582 Other shoulder lesions, left shoulder: Secondary | ICD-10-CM

## 2019-01-12 MED ORDER — HYDROCODONE-ACETAMINOPHEN 5-325 MG PO TABS: 1.0000 | ORAL_TABLET | Freq: Four times a day (QID) | ORAL | 0 refills | Status: DC | PRN

## 2019-01-12 MED ORDER — TIZANIDINE HCL 4 MG PO TABS: 4.0000 mg | ORAL_TABLET | Freq: Four times a day (QID) | ORAL | 1 refills | Status: DC | PRN

## 2019-01-12 NOTE — Progress Notes (Signed)
error 

## 2019-01-12 NOTE — Telephone Encounter (Signed)
Order for PT has been done

## 2019-01-12 NOTE — Addendum Note (Signed)
Addended by: Minda Ditto, Geoffery Spruce on: 01/12/2019 05:02 PM   Modules accepted: Orders

## 2019-01-28 ENCOUNTER — Ambulatory Visit: Payer: Medicaid Other | Admitting: Physical Therapy

## 2019-01-28 ENCOUNTER — Other Ambulatory Visit: Payer: Self-pay

## 2019-01-28 ENCOUNTER — Encounter: Payer: Self-pay | Admitting: Physical Therapy

## 2019-01-28 DIAGNOSIS — M542 Cervicalgia: Secondary | ICD-10-CM

## 2019-01-28 DIAGNOSIS — M25512 Pain in left shoulder: Secondary | ICD-10-CM | POA: Diagnosis present

## 2019-01-28 NOTE — Patient Instructions (Signed)
Access Code: [email or web address]  URL: [email or web address]  Date: 01/28/2019  Prepared by: Lyndee Hensen   Exercises Supine Shoulder Flexion with Dowel - 10 reps - 1 sets - 2x daily Standing Scapular Retraction - 10 reps - 2 sets - 2x daily Seated Cervical Retraction - 10 reps - 2 sets - 2x daily Seated Cervical Sidebending Stretch - 3 reps - 30 hold - 2x daily

## 2019-01-28 NOTE — Therapy (Signed)
[facility] Outpatient Rehabilitation [facility] [address] [place], [place], [address] Phone: [phone]   Fax:  [phone]  Physical Therapy Evaluation  Patient Details  Name: Yvonne [name] MRN: [id number] Date of Birth: 1986/01/14 Referring Provider (PT): Vira Browns   Encounter Date: 01/28/2019  PT End of Session - 01/28/19 1543    Visit Number  1    Number of Visits  4    Date for PT Re-Evaluation  03/11/19    Authorization Type  MCD    PT Start Time  1532    PT Stop Time  1610    PT Time Calculation (min)  38 min    Activity Tolerance  Patient tolerated treatment well    Behavior During Therapy  [facility] for tasks assessed/performed       Past Medical History:  Diagnosis Date  . Anemia   . Anxiety   . 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 Asthma    albuterol inhaler  . Blood transfusion    2008 with c/s 4 units transfused  . Carpal tunnel syndrome on right   . Chronic back pain   . Depression   . Dyspnea    exertion  . Headache(784.0)    otc ibuprofen 800mg   . Heart murmur    as a child  . Obesity   . Pneumonia    as a child    Past Surgical History:  Procedure Laterality Date  . ABDOMINAL HYSTERECTOMY N/A 09/29/2012   Procedure: HYSTERECTOMY ABDOMINAL;  Surgeon: 10/01/2012, MD;  [facility]: [facility];  Service: Gynecology;  Laterality: N/A;  . APPENDECTOMY    . BACK SURGERY    . BILATERAL SALPINGECTOMY Bilateral 09/29/2012   Procedure: BILATERAL SALPINGECTOMY;  Surgeon: 10/01/2012, MD;  [facility]: [facility];  Service: Gynecology;  Laterality: Bilateral;  . CESAREAN SECTION    . CHOLECYSTECTOMY    . CYSTOSCOPY Bilateral 09/29/2012   Procedure: CYSTOSCOPY;  Surgeon: 10/01/2012, MD;  [facility]: [facility];  Service: Gynecology;  Laterality: Bilateral;  . ENDOMETRIAL ABLATION    . EXAMINATION UNDER ANESTHESIA N/A 10/16/2012   Procedure: EXAM UNDER ANESTHESIA;  Surgeon: 10/18/2012, MD;  [facility]:  [facility];  Service: Gynecology;  Laterality: N/A;  Suturing Vaginal Cuff  . HAND SURGERY Right    carpal tunnel  . LAPAROSCOPIC ASSISTED VAGINAL HYSTERECTOMY N/A 09/29/2012   Procedure: LAPAROSCOPIC ASSISTED VAGINAL HYSTERECTOMY;  Surgeon: 10/01/2012, MD;  [facility];  Service: Gynecology;  Laterality: N/A;  . LAPAROSCOPIC HYSTERECTOMY Bilateral 09/29/2012   Procedure: HYSTERECTOMY TOTAL LAPAROSCOPIC;  Surgeon: 10/01/2012, MD;  [facility]: [facility];  Service: Gynecology;  Laterality: Bilateral;  Total Laparoscopic Hysterectomy, Bilateral Salpingectomies, Cystoscopy poss. LAVH poss. TAH  . LAPAROSCOPY     x3  . LUMBAR LAMINECTOMY N/A 11/05/2014   Procedure: RIGHT L4-5 MICRODISCECTOMY ;  Surgeon: 01/05/2015, MD;  [facility];  Service: Orthopedics;  Laterality: N/A;  . LUMBAR LAMINECTOMY/DECOMPRESSION MICRODISCECTOMY N/A 04/10/2016   Procedure: Right L4-5 Microdiscectomy;  Surgeon: 06/08/2016, MD;  [facility]: [facility];  Service: Orthopedics;  Laterality: N/A;  . TONSILLECTOMY     T & A  . TRANSFORAMINAL LUMBAR INTERBODY FUSION (TLIF) WITH PEDICLE SCREW FIXATION 1 LEVEL N/A 02/13/2017   Procedure: Transforaminal Lumbar Interbody Fusion - Lumbar four-five;  Surgeon: 02/15/2017, MD;  Location[facility];  Service: Neurosurgery;  Laterality: N/A;  .  TUBAL LIGATION      There were no vitals filed for this visit.   Subjective Assessment - 01/28/19 1535    Subjective  Pt had fall onto L side, about 1 month ago. Has had increased pain since then. States pain in bil UT region, and upper t-spine.  Pt was working at [employer], [employer], has quit that job since, because it was so painful. Also does [employer] eats. Denies numbness tingling or UE pain.    Limitations  Writing;House hold activities;Lifting    Patient Stated Goals  Decreased pain,    Currently in Pain?  Yes    Pain Score  8     Pain Orientation  Right;Left;Mid;Upper    Pain Descriptors / Indicators  Aching    Pain Type   Acute pain    Pain Onset  More than a month ago    Pain Frequency  Intermittent    Aggravating Factors   lifting, reaching, carrying, work duties.    Pain Relieving Factors  laying down.         [facility] PT Assessment - 01/28/19 0001      Assessment   Medical Diagnosis  L shoulder strain, contusion, Cervical disc herniation    Referring Provider (PT)  Vira Browns    Hand Dominance  Right    Prior Therapy  no      Balance Screen   Has the patient fallen in the past 6 months  Yes    How many times?  1    Has the patient had a decrease in activity level because of a fear of falling?   Yes    Is the patient reluctant to leave their home because of a fear of falling?   No      Prior Function   Level of Independence  Independent      Cognition   Overall Cognitive Status  Within Functional Limits for tasks assessed      Posture/Postural Control   Posture Comments  rounded shoulders, mild fwd head      ROM / Strength   AROM / PROM / Strength  AROM;Strength      AROM   Overall AROM Comments  Cervical ROM: WFL, pain with SB and rotation.     AROM Assessment Site  Shoulder    Right/Left Shoulder  Left;Right    Right Shoulder Flexion  150 Degrees    Right Shoulder ABduction  150 Degrees    Left Shoulder Flexion  130 Degrees    Left Shoulder ABduction  140 Degrees      Strength   Strength Assessment Site  Shoulder    Right/Left Shoulder  Right;Left    Right Shoulder Flexion  4-/5    Right Shoulder ABduction  4-/5    Right Shoulder Internal Rotation  4/5    Right Shoulder External Rotation  4/5    Left Shoulder Flexion  4-/5    Left Shoulder ABduction  4-/5    Left Shoulder Internal Rotation  4/5    Left Shoulder External Rotation  4/5      Palpation   Palpation comment  Tightness and tenderness in bil UT region, Soreness with PA mobs for c-spine and upper t-spine, minimal/no pain in upper cervical paraspinals or sub occipitals.;       Special Tests   Other special tests   Neg ULTT;                 Objective measurements completed on  examination: See above findings.      [facility] Adult PT Treatment/Exercise - 01/28/19 0001      Exercises   Exercises  Neck      Neck Exercises: Seated   Neck Retraction  15 reps    Other Seated Exercise  Scap squeeze x15;       Neck Exercises: Supine   Other Supine Exercise  Supine AAROM shoulder flexion with cane x10;       Neck Exercises: Stretches   Upper Trapezius Stretch  2 reps;30 seconds;Right;Left             PT Education - 01/28/19 1543    Education Details  HEP, PT POC    Person(s) Educated  Patient    Methods  Explanation;Demonstration;Verbal cues;Handout    Comprehension  Verbalized understanding;Returned demonstration;Tactile cues required       PT Short Term Goals - 01/28/19 1614      PT SHORT TERM GOAL #1   Title  Pt to be independent with initial HEP    Baseline  no HEP    Time  2    Period  Weeks    Status  New    Target Date  02/11/19      PT SHORT TERM GOAL #2   Title  Pt to report decreased pain to 0-4/10 in upper trap and neck region.    Baseline  8/10 pain    Time  6    Period  Weeks    Target Date  03/11/19      PT SHORT TERM GOAL #3   Title  Pt to demo imroved ability for full shoulder elevation on L, to improve ability for IADLs.    Baseline  up to 130 for flexion on L , with pain    Time  6    Period  Weeks    Target Date  03/11/19      PT SHORT TERM GOAL #4   Title  Pt to demo improved shoulder strength to at least 4/5  to improve ability for reaching, lifting, carrying. and work duties    Baseline  4-/5 bilaterally    Time  6    Status  New    Target Date  03/11/19                Plan - 01/28/19 1622    Clinical Impression Statement  Pt presents with primary complaint of increased pain in Bil upper trap region and into L shoulder. She has tenderness and tightness in bil UTs, and mild pain in posterior shoulder. Pt with increased pain with  full shoulder elevation, as well as with reaching, lifting, carrying and IADLs. She has cervical ROM wnl, but painful, and has mild shoulder ROM limitations due to pain. She also has weakness in shoulder and postural muscles.  She has poor seated posture with rounded shoulders. Discussed proper posture and alignment for sitting, and activities, will benefit from continued work on this. Pt to benefit from skilled PT to improve deficits and pain.    Examination-Activity Limitations  Reach Overhead;Caring for Others;Carry;Lift    Examination-Participation Restrictions  Meal Prep;Cleaning;Community Activity;Driving;Shop;Laundry    Stability/Clinical Decision Making  Stable/Uncomplicated    Clinical Decision Making  Low    Rehab Potential  Good    PT Frequency  1x / week    PT Duration  4 weeks    PT Treatment/Interventions  ADLs/Self Care Home Management;Cryotherapy;Electrical Stimulation;Ultrasound;Traction;Moist Heat;Iontophoresis 4mg /ml Dexamethasone;Functional mobility training;Therapeutic activities;Therapeutic exercise;Neuromuscular re-education;Patient/family  education;Passive range of motion;Joint Manipulations;Spinal Manipulations;Vasopneumatic Device;Taping    PT Next Visit Plan  Manual for UT tightness and soreness. Shoulder AROM, light strength, posture education and strengthening.    PT Home Exercise Plan  [id number]    Consulted and Agree with Plan of Care  Patient       Patient will benefit from skilled therapeutic intervention in order to improve the following deficits and impairments:  Decreased range of motion, Impaired UE functional use, Decreased activity tolerance, Improper body mechanics, Decreased mobility, Decreased strength  Visit Diagnosis: Cervicalgia  Acute pain of left shoulder     Problem List Patient Active Problem List   Diagnosis Date Noted  . S/P lumbar spinal fusion 02/13/2017  . Herniated nucleus pulposus, lumbar 04/10/2016  . HNP (herniated nucleus  pulposus), lumbar 11/05/2014    Class: Chronic  . S/P endometrial ablation - HTA 2012 06/03/2012  . Sterilization - h/o BTL 06/03/2012  . OBESITY 09/03/2007  . MIGRAINE HEADACHE 09/03/2007    Lyndee Hensen, PT, DPT 4:34 PM  01/28/19    [facility] Outpatient Rehabilitation [facility] [address] [place], [place], [address] Phone: [phone]   Fax:  [phone]  Name: Yvonne [name] MRN: [id number] Date of Birth: 30-May-1985

## 2019-02-10 ENCOUNTER — Other Ambulatory Visit: Payer: Self-pay | Admitting: Specialist

## 2019-02-10 MED ORDER — HYDROCODONE-ACETAMINOPHEN 5-325 MG PO TABS: 1.0000 | ORAL_TABLET | Freq: Four times a day (QID) | ORAL | 0 refills | Status: DC | PRN

## 2019-02-10 NOTE — Telephone Encounter (Signed)
Hydrocodone [facility] [place]

## 2019-02-11 ENCOUNTER — Ambulatory Visit: Payer: Medicaid Other | Admitting: Physical Therapy

## 2019-02-11 ENCOUNTER — Encounter: Payer: Self-pay | Admitting: Physical Therapy

## 2019-02-11 ENCOUNTER — Other Ambulatory Visit: Payer: Self-pay

## 2019-02-11 DIAGNOSIS — M542 Cervicalgia: Secondary | ICD-10-CM | POA: Insufficient documentation

## 2019-02-11 DIAGNOSIS — M25512 Pain in left shoulder: Secondary | ICD-10-CM | POA: Insufficient documentation

## 2019-02-11 NOTE — Therapy (Signed)
[facility] Outpatient Rehabilitation [facility] [address] [place], [place], [address] Phone: [phone]   Fax:  [phone]  Physical Therapy Treatment  Patient Details  Name: Yvonne [name] MRN: [id number] Date of Birth: Jul 12, 1985 Referring Provider (PT): Vira Browns   Encounter Date: 02/11/2019  PT End of Session - 02/11/19 1248    Visit Number  --    Number of Visits  --    Date for PT Re-Evaluation  --    Authorization Type  --    Authorization Time Period  --    Authorization - Visit Number  --    Authorization - Number of Visits  --    PT Start Time  --    PT Stop Time  --    PT Time Calculation (min)  --    Activity Tolerance  --    Behavior During Therapy  --       Past Medical History:  Diagnosis Date  . Anemia   . Anxiety   . 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 Asthma    albuterol inhaler  . Blood transfusion    2008 with c/s 4 units transfused  . Carpal tunnel syndrome on right   . Chronic back pain   . Depression   . Dyspnea    exertion  . Headache(784.0)    otc ibuprofen 800mg   . Heart murmur    as a child  . Obesity   . Pneumonia    as a child    Past Surgical History:  Procedure Laterality Date  . ABDOMINAL HYSTERECTOMY N/A 09/29/2012   Procedure: HYSTERECTOMY ABDOMINAL;  Surgeon: 10/01/2012, MD;  [facility]: [facility];  Service: Gynecology;  Laterality: N/A;  . APPENDECTOMY    . BACK SURGERY    . BILATERAL SALPINGECTOMY Bilateral 09/29/2012   Procedure: BILATERAL SALPINGECTOMY;  Surgeon: 10/01/2012, MD;  [facility]: [facility];  Service: Gynecology;  Laterality: Bilateral;  . CESAREAN SECTION    . CHOLECYSTECTOMY    . CYSTOSCOPY Bilateral 09/29/2012   Procedure: CYSTOSCOPY;  Surgeon: 10/01/2012, MD;  [facility]: [facility];  Service: Gynecology;  Laterality: Bilateral;  . ENDOMETRIAL ABLATION    . EXAMINATION UNDER ANESTHESIA N/A 10/16/2012   Procedure: EXAM UNDER ANESTHESIA;   Surgeon: 10/18/2012, MD;  [facility]: [facility];  Service: Gynecology;  Laterality: N/A;  Suturing Vaginal Cuff  . HAND SURGERY Right    carpal tunnel  . LAPAROSCOPIC ASSISTED VAGINAL HYSTERECTOMY N/A 09/29/2012   Procedure: LAPAROSCOPIC ASSISTED VAGINAL HYSTERECTOMY;  Surgeon: 10/01/2012, MD;  [facility];  Service: Gynecology;  Laterality: N/A;  . LAPAROSCOPIC HYSTERECTOMY Bilateral 09/29/2012   Procedure: HYSTERECTOMY TOTAL LAPAROSCOPIC;  Surgeon: 10/01/2012, MD;  [facility]: [facility];  Service: Gynecology;  Laterality: Bilateral;  Total Laparoscopic Hysterectomy, Bilateral Salpingectomies, Cystoscopy poss. LAVH poss. TAH  . LAPAROSCOPY     x3  . LUMBAR LAMINECTOMY N/A 11/05/2014   Procedure: RIGHT L4-5 MICRODISCECTOMY ;  Surgeon: 01/05/2015, MD;  [facility];  Service: Orthopedics;  Laterality: N/A;  . LUMBAR LAMINECTOMY/DECOMPRESSION MICRODISCECTOMY N/A 04/10/2016   Procedure: Right L4-5 Microdiscectomy;  Surgeon: 06/08/2016, MD;  [facility]: [facility];  Service: Orthopedics;  Laterality: N/A;  . TONSILLECTOMY     T & A  . TRANSFORAMINAL LUMBAR INTERBODY FUSION (TLIF) WITH PEDICLE SCREW FIXATION 1 LEVEL N/A 02/13/2017   Procedure: Transforaminal Lumbar Interbody Fusion -  Visit Diagnosis: Cervicalgia  Acute pain of left shoulder     Problem List Patient Active Problem List   Diagnosis Date Noted  . S/P lumbar spinal fusion 02/13/2017  . Herniated nucleus pulposus, lumbar 04/10/2016  . HNP (herniated  nucleus pulposus), lumbar 11/05/2014    Class: Chronic  . S/P endometrial ablation - HTA 2012 06/03/2012  . Sterilization - h/o BTL 06/03/2012  . OBESITY 09/03/2007  . MIGRAINE HEADACHE 09/03/2007    Rosana Hoesampbell Raahi Korber, PT, DPT, LAT, ATC 02/11/19  1:04 PM Phone: [phone] Fax: [phone]   [facility] [address] [place], [place], [address] Phone: [phone]   Fax:  [phone]  Name: Yvonne [name] MRN: [id number] Date of Birth: 09-10-1985  [facility] Outpatient Rehabilitation [facility] [address] [place], [place], [address] Phone: [phone]   Fax:  [phone]  Physical Therapy Treatment  Patient Details  Name: Yvonne [name] MRN: [id number] Date of Birth: Jul 12, 1985 Referring Provider (PT): Vira Browns   Encounter Date: 02/11/2019  PT End of Session - 02/11/19 1248    Visit Number  --    Number of Visits  --    Date for PT Re-Evaluation  --    Authorization Type  --    Authorization Time Period  --    Authorization - Visit Number  --    Authorization - Number of Visits  --    PT Start Time  --    PT Stop Time  --    PT Time Calculation (min)  --    Activity Tolerance  --    Behavior During Therapy  --       Past Medical History:  Diagnosis Date  . Anemia   . Anxiety   . 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 Asthma    albuterol inhaler  . Blood transfusion    2008 with c/s 4 units transfused  . Carpal tunnel syndrome on right   . Chronic back pain   . Depression   . Dyspnea    exertion  . Headache(784.0)    otc ibuprofen 800mg   . Heart murmur    as a child  . Obesity   . Pneumonia    as a child    Past Surgical History:  Procedure Laterality Date  . ABDOMINAL HYSTERECTOMY N/A 09/29/2012   Procedure: HYSTERECTOMY ABDOMINAL;  Surgeon: 10/01/2012, MD;  [facility];  Service: Gynecology;  Laterality: N/A;  . APPENDECTOMY    . BACK SURGERY    . BILATERAL SALPINGECTOMY Bilateral 09/29/2012   Procedure: BILATERAL SALPINGECTOMY;  Surgeon: 10/01/2012, MD;  [facility];  Service: Gynecology;  Laterality: Bilateral;  . CESAREAN SECTION    . CHOLECYSTECTOMY    . CYSTOSCOPY Bilateral 09/29/2012   Procedure: CYSTOSCOPY;  Surgeon: 10/01/2012, MD;  [facility];  Service: Gynecology;  Laterality: Bilateral;  . ENDOMETRIAL ABLATION    . EXAMINATION UNDER ANESTHESIA N/A 10/16/2012   Procedure: EXAM UNDER ANESTHESIA;   Surgeon: 10/18/2012, MD;  [facility];  Service: Gynecology;  Laterality: N/A;  Suturing Vaginal Cuff  . HAND SURGERY Right    carpal tunnel  . LAPAROSCOPIC ASSISTED VAGINAL HYSTERECTOMY N/A 09/29/2012   Procedure: LAPAROSCOPIC ASSISTED VAGINAL HYSTERECTOMY;  Surgeon: 10/01/2012, MD;  [facility];  Service: Gynecology;  Laterality: N/A;  . LAPAROSCOPIC HYSTERECTOMY Bilateral 09/29/2012   Procedure: HYSTERECTOMY TOTAL LAPAROSCOPIC;  Surgeon: 10/01/2012, MD;  [facility];  Service: Gynecology;  Laterality: Bilateral;  Total Laparoscopic Hysterectomy, Bilateral Salpingectomies, Cystoscopy poss. LAVH poss. TAH  . LAPAROSCOPY     x3  . LUMBAR LAMINECTOMY N/A 11/05/2014   Procedure: RIGHT L4-5 MICRODISCECTOMY ;  Surgeon: 01/05/2015, MD;  [facility];  Service: Orthopedics;  Laterality: N/A;  . LUMBAR LAMINECTOMY/DECOMPRESSION MICRODISCECTOMY N/A 04/10/2016   Procedure: Right L4-5 Microdiscectomy;  Surgeon: 06/08/2016, MD;  [facility];  Service: Orthopedics;  Laterality: N/A;  . TONSILLECTOMY     T & A  . TRANSFORAMINAL LUMBAR INTERBODY FUSION (TLIF) WITH PEDICLE SCREW FIXATION 1 LEVEL N/A 02/13/2017   Procedure: Transforaminal Lumbar Interbody Fusion -

## 2019-02-13 ENCOUNTER — Ambulatory Visit (HOSPITAL_COMMUNITY)
Admission: EM | Admit: 2019-02-13 | Discharge: 2019-02-13 | Disposition: A | Discharge status: Home / Self Care | Payer: Medicaid Other | Attending: Physician Assistant | Admitting: Physician Assistant

## 2019-02-13 ENCOUNTER — Encounter (HOSPITAL_COMMUNITY): Payer: Self-pay

## 2019-02-13 ENCOUNTER — Other Ambulatory Visit: Payer: Self-pay

## 2019-02-13 DIAGNOSIS — N760 Acute vaginitis: Secondary | ICD-10-CM | POA: Insufficient documentation

## 2019-02-13 DIAGNOSIS — Z20822 Contact with and (suspected) exposure to covid-19: Secondary | ICD-10-CM

## 2019-02-13 DIAGNOSIS — B3731 Acute candidiasis of vulva and vagina: Secondary | ICD-10-CM

## 2019-02-13 DIAGNOSIS — Z20828 Contact with and (suspected) exposure to other viral communicable diseases: Secondary | ICD-10-CM | POA: Insufficient documentation

## 2019-02-13 DIAGNOSIS — B373 Candidiasis of vulva and vagina: Secondary | ICD-10-CM | POA: Diagnosis not present

## 2019-02-13 DIAGNOSIS — B9689 Other specified bacterial agents as the cause of diseases classified elsewhere: Secondary | ICD-10-CM | POA: Diagnosis present

## 2019-02-13 DIAGNOSIS — M25531 Pain in right wrist: Secondary | ICD-10-CM | POA: Diagnosis present

## 2019-02-13 MED ORDER — IBUPROFEN 800 MG PO TABS: 800.0000 mg | ORAL_TABLET | Freq: Three times a day (TID) | ORAL | 0 refills | Status: DC | PRN

## 2019-02-13 MED ORDER — METRONIDAZOLE 500 MG PO TABS: 500.0000 mg | ORAL_TABLET | Freq: Two times a day (BID) | ORAL | 0 refills | Status: DC

## 2019-02-13 MED ORDER — FLUCONAZOLE 150 MG PO TABS: ORAL_TABLET | ORAL | 0 refills | Status: DC

## 2019-02-13 NOTE — ED Triage Notes (Signed)
Pt presents with white vaginal discharge and odor x 2 days.  Pt also has pain and swelling in her right hand that started last night. Denies any injury.  Pt was also exposed to COVID and would like to be tested. Denies symptoms.

## 2019-02-13 NOTE — Discharge Instructions (Signed)
See the Hand surgeon as scheduled.  Return if any problems.

## 2019-02-13 NOTE — ED Provider Notes (Signed)
[facility]    CSN: [id number] Arrival date & time: 02/13/19  1117      History   Chief Complaint Chief Complaint  Patient presents with  . Vaginal Discharge  . Joint Swelling  . covid test    HPI Yvonne [name] is a 33 y.o. female.   The history is provided by the patient. No language interpreter was used.  Vaginal Discharge Quality:  Watery Severity:  Mild Onset quality:  Gradual Progression:  Worsening Chronicity:  New Relieved by:  Nothing Worsened by:  Nothing Ineffective treatments:  None tried Associated symptoms: vaginal itching   Associated symptoms: no rash    Pt thinks she has yeast or BV.  Pt also reports she was exposed to covid.  Pt has swelling and pain to right wrist.  Pt has an appointment to see the Hand surgeon next week  Past Medical History:  Diagnosis Date  . Anemia   . Anxiety   . 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 Asthma    albuterol inhaler  . Blood transfusion    2008 with c/s 4 units transfused  . Carpal tunnel syndrome on right   . Chronic back pain   . Depression   . Dyspnea    exertion  . Headache(784.0)    otc ibuprofen 800mg   . Heart murmur    as a child  . Obesity   . Pneumonia    as a child    Patient Active Problem List   Diagnosis Date Noted  . S/P lumbar spinal fusion 02/13/2017  . Herniated nucleus pulposus, lumbar 04/10/2016  . HNP (herniated nucleus pulposus), lumbar 11/05/2014    Class: Chronic  . S/P endometrial ablation - HTA 2012 06/03/2012  . Sterilization - h/o BTL 06/03/2012  . OBESITY 09/03/2007  . MIGRAINE HEADACHE 09/03/2007    Past Surgical History:  Procedure Laterality Date  . ABDOMINAL HYSTERECTOMY N/A 09/29/2012   Procedure: HYSTERECTOMY ABDOMINAL;  Surgeon: 10/01/2012, MD;  Location: [facility];  Service: Gynecology;  Laterality: N/A;  . APPENDECTOMY    . BACK SURGERY    . BILATERAL SALPINGECTOMY Bilateral 09/29/2012   Procedure:  BILATERAL SALPINGECTOMY;  Surgeon: 10/01/2012, MD;  Location: [facility];  Service: Gynecology;  Laterality: Bilateral;  . CESAREAN SECTION    . CHOLECYSTECTOMY    . CYSTOSCOPY Bilateral 09/29/2012   Procedure: CYSTOSCOPY;  Surgeon: 10/01/2012, MD;  [facility]: [facility];  Service: Gynecology;  Laterality: Bilateral;  . ENDOMETRIAL ABLATION    . EXAMINATION UNDER ANESTHESIA N/A 10/16/2012   Procedure: EXAM UNDER ANESTHESIA;  Surgeon: 10/18/2012, MD;  Location: [facility];  Service: Gynecology;  Laterality: N/A;  Suturing Vaginal Cuff  . HAND SURGERY Right    carpal tunnel  . LAPAROSCOPIC ASSISTED VAGINAL HYSTERECTOMY N/A 09/29/2012   Procedure: LAPAROSCOPIC ASSISTED VAGINAL HYSTERECTOMY;  Surgeon: 10/01/2012, MD;  [facility]: [facility];  Service: Gynecology;  Laterality: N/A;  . LAPAROSCOPIC HYSTERECTOMY Bilateral 09/29/2012   Procedure: HYSTERECTOMY TOTAL LAPAROSCOPIC;  Surgeon: 10/01/2012, MD;  Location: [facility];  Service: Gynecology;  Laterality: Bilateral;  Total Laparoscopic Hysterectomy, Bilateral Salpingectomies, Cystoscopy poss. LAVH poss. TAH  . LAPAROSCOPY     x3  . LUMBAR LAMINECTOMY N/A 11/05/2014   Procedure: RIGHT L4-5 MICRODISCECTOMY ;  Surgeon: 01/05/2015, MD;  [facility];  Service: Orthopedics;  Laterality: N/A;  . LUMBAR LAMINECTOMY/DECOMPRESSION MICRODISCECTOMY N/A  04/10/2016   Procedure: Right L4-5 Microdiscectomy;  Surgeon: Kerrin Champagne, MD;  Location: [facility] OR;  Service: Orthopedics;  Laterality: N/A;  . TONSILLECTOMY     T & A  . TRANSFORAMINAL LUMBAR INTERBODY FUSION (TLIF) WITH PEDICLE SCREW FIXATION 1 LEVEL N/A 02/13/2017   Procedure: Transforaminal Lumbar Interbody Fusion - Lumbar four-five;  Surgeon: Tia Alert, MD;  Location: [facility] OR;  Service: Neurosurgery;  Laterality: N/A;  . TUBAL LIGATION      OB History    Gravida  4   Para  4   Term  4   Preterm      AB      Living  1     SAB      TAB      Ectopic      Multiple      Live  Births  1            Home Medications    Prior to Admission medications   Medication Sig Start Date End Date Taking? Authorizing Provider  ALPRAZolam Prudy Feeler) 0.5 MG tablet Take one tablet po at the MRI. 08/06/18   Kerrin Champagne, MD  fluconazole (DIFLUCAN) 150 MG tablet Take 1 tablet weekly. 02/13/19   Elson Areas, PA-C  HYDROcodone-acetaminophen (NORCO/VICODIN) 5-325 MG tablet Take 1 tablet by mouth every 6 (six) hours as needed for moderate pain. 02/10/19   Kerrin Champagne, MD  ibuprofen (ADVIL) 800 MG tablet Take 1 tablet (800 mg total) by mouth every 8 (eight) hours as needed for moderate pain. 02/13/19   Elson Areas, PA-C  metaxalone (SKELAXIN) 800 MG tablet Take 1 tablet (800 mg total) by mouth 3 (three) times daily. 12/25/18   Kerrin Champagne, MD  metroNIDAZOLE (FLAGYL) 500 MG tablet Take 1 tablet (500 mg total) by mouth 2 (two) times daily. 02/13/19   Elson Areas, PA-C  nitrofurantoin, macrocrystal-monohydrate, (MACROBID) 100 MG capsule Take 1 capsule (100 mg total) by mouth 2 (two) times daily. 08/26/18   Eustace Moore, MD  predniSONE (DELTASONE) 20 MG tablet Day 1-5: Take 2 tablets daily with breakfast. Day 6-10: Take 1 tablet daily with breakfast. 09/04/18   Wallis Bamberg, PA-C  QUEtiapine (SEROQUEL) 300 MG tablet Take 300 mg by mouth at bedtime.    [provider]  tiZANidine (ZANAFLEX) 4 MG tablet Take 1 tablet (4 mg total) by mouth every 6 (six) hours as needed for muscle spasms. 01/12/19   Kerrin Champagne, MD    Family History Family History  Problem Relation Age of Onset  . Diabetes Mother   . Diabetes Maternal Grandmother   . Stroke Father     Social History Social History   Tobacco Use  . Smoking status: Former Smoker    Packs/day: 0.00    Types: Cigarettes    Quit date: 10/17/2015    Years since quitting: 3.3  . Smokeless tobacco: Never Used  . Tobacco comment: 1-2 cigarettes a day  Substance Use Topics  . Alcohol use: Yes    Alcohol/week:  1.0 standard drinks    Types: 1 Glasses of wine per week    Comment: ocassionally  . Drug use: No     Allergies   Bee venom, Reglan [metoclopramide hcl], and Aspirin   Review of Systems Review of Systems  Genitourinary: Positive for vaginal discharge.  Musculoskeletal: Positive for arthralgias and myalgias.  All other systems reviewed and are negative.    Physical Exam Triage Vital Signs  ED Triage Vitals [02/13/19 1240]  Enc Vitals Group     BP 133/80     Pulse Rate 84     Resp 18     Temp 99 F (37.2 C)     Temp src      SpO2 100 %     Weight      Height      Head Circumference      Peak Flow      Pain Score 7     Pain Loc      Pain Edu?      Excl. in GC?    No data found.  Updated Vital Signs BP 133/80   Pulse 84   Temp 99 F (37.2 C)   Resp 18   LMP 07/25/2012   SpO2 100%   Visual Acuity Right Eye Distance:   Left Eye Distance:   Bilateral Distance:    Right Eye Near:   Left Eye Near:    Bilateral Near:     Physical Exam Vitals signs and nursing note reviewed.  Constitutional:      Appearance: She is well-developed.  HENT:     Head: Normocephalic.  Neck:     Musculoskeletal: Normal range of motion.  Cardiovascular:     Rate and Rhythm: Normal rate.  Pulmonary:     Effort: Pulmonary effort is normal.  Abdominal:     General: There is no distension.  Musculoskeletal:        General: Swelling present.     Comments: Right wrist swollen tender no erythema   Skin:    General: Skin is warm.  Neurological:     Mental Status: She is alert and oriented to person, place, and time.  Psychiatric:        Mood and Affect: Mood normal.      UC Treatments / Results  Labs (all labs ordered are listed, but only abnormal results are displayed) Labs Reviewed  NOVEL CORONAVIRUS, NAA (HOSP ORDER, SEND-OUT TO REF LAB; TAT 18-24 HRS)  CERVICOVAGINAL ANCILLARY ONLY    EKG   Radiology No results found.  Procedures Procedures (including  critical care time)  Medications Ordered in UC Medications - No data to display  Initial Impression / Assessment and Plan / UC Course  I have reviewed the triage vital signs and the nursing notes.  Pertinent labs & imaging results that were available during my care of the patient were reviewed by me and considered in my medical decision making (see chart for details).     MDM   Pt given rx for flagyl and diflucan,  Pt advised to see Dr. Mina Marbleweingold as scheduled. Final Clinical Impressions(s) / UC Diagnoses   Final diagnoses:  Vaginal yeast infection  BV (bacterial vaginosis)  Exposure to COVID-19 virus  Wrist pain, right     Discharge Instructions     See the Hand surgeon as scheduled.  Return if any problems.     ED Prescriptions    Medication Sig Dispense Auth. Provider   metroNIDAZOLE (FLAGYL) 500 MG tablet Take 1 tablet (500 mg total) by mouth 2 (two) times daily. 14 tablet Dontarius Sheley K, [place]   fluconazole (DIFLUCAN) 150 MG tablet Take 1 tablet weekly. 2 tablet Armella Stogner K, PA-C   ibuprofen (ADVIL) 800 MG tablet Take 1 tablet (800 mg total) by mouth every 8 (eight) hours as needed for moderate pain. 30 tablet Elson AreasSofia, Umar Patmon K, [place]-C     PDMP not reviewed this encounter.  An After Visit Summary was printed and given to the patient.    Fransico Meadow, [place] 02/13/19 1302

## 2019-02-15 LAB — NOVEL CORONAVIRUS, NAA (HOSP ORDER, SEND-OUT TO REF LAB; TAT 18-24 HRS): SARS-CoV-2, NAA: NOT DETECTED

## 2019-02-17 LAB — CERVICOVAGINAL ANCILLARY ONLY
Bacterial vaginitis: POSITIVE — AB
Candida vaginitis: NEGATIVE
Chlamydia: NEGATIVE
Neisseria Gonorrhea: NEGATIVE
Trichomonas: NEGATIVE

## 2019-02-18 ENCOUNTER — Ambulatory Visit: Payer: Medicaid Other | Admitting: Physical Therapy

## 2019-02-20 ENCOUNTER — Other Ambulatory Visit: Payer: Self-pay

## 2019-02-20 ENCOUNTER — Encounter: Payer: Self-pay | Admitting: Neurology

## 2019-02-20 DIAGNOSIS — R202 Paresthesia of skin: Secondary | ICD-10-CM

## 2019-02-24 ENCOUNTER — Encounter: Payer: Self-pay | Admitting: Physical Therapy

## 2019-02-24 ENCOUNTER — Other Ambulatory Visit: Payer: Self-pay

## 2019-02-24 ENCOUNTER — Ambulatory Visit: Payer: Medicaid Other | Admitting: Physical Therapy

## 2019-02-24 ENCOUNTER — Ambulatory Visit: Payer: Medicaid Other | Attending: Specialist | Admitting: Physical Therapy

## 2019-02-24 DIAGNOSIS — M542 Cervicalgia: Secondary | ICD-10-CM | POA: Diagnosis not present

## 2019-02-24 DIAGNOSIS — M25512 Pain in left shoulder: Secondary | ICD-10-CM

## 2019-02-24 NOTE — Therapy (Signed)
[facility] [address] [place], [place], [address] Phone: [phone]   Fax:  [phone]  Physical Therapy Treatment  Patient Details  Name: Yvonne [name] MRN: [id number] Date of Birth: 1985-06-15 Referring Provider (PT): Vira Browns   Encounter Date: 02/24/2019  PT End of Session - 02/24/19 1458    Visit Number  2    Number of Visits  4    Date for PT Re-Evaluation  03/11/19    Authorization Type  MCD    Authorization Time Period  11/9 to 11/29    Authorization - Visit Number  1    Authorization - Number of Visits  3    PT Start Time  1445    PT Stop Time  1538    PT Time Calculation (min)  53 min    Activity Tolerance  Patient tolerated treatment well    Behavior During Therapy  [facility] for tasks assessed/performed       Past Medical History:  Diagnosis Date  . Anemia   . Anxiety   . 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 Asthma    albuterol inhaler  . Blood transfusion    2008 with c/s 4 units transfused  . Carpal tunnel syndrome on right   . Chronic back pain   . Depression   . Dyspnea    exertion  . Headache(784.0)    otc ibuprofen 800mg   . Heart murmur    as a child  . Obesity   . Pneumonia    as a child    Past Surgical History:  Procedure Laterality Date  . ABDOMINAL HYSTERECTOMY N/A 09/29/2012   Procedure: HYSTERECTOMY ABDOMINAL;  Surgeon: 10/01/2012, MD;  [facility]: [facility];  Service: Gynecology;  Laterality: N/A;  . APPENDECTOMY    . BACK SURGERY    . BILATERAL SALPINGECTOMY Bilateral 09/29/2012   Procedure: BILATERAL SALPINGECTOMY;  Surgeon: 10/01/2012, MD;  Location: [facility];  Service: Gynecology;  Laterality: Bilateral;  . CESAREAN SECTION    . CHOLECYSTECTOMY    . CYSTOSCOPY Bilateral 09/29/2012   Procedure: CYSTOSCOPY;  Surgeon: 10/01/2012, MD;  [facility]: [facility];  Service: Gynecology;  Laterality: Bilateral;  . ENDOMETRIAL ABLATION    .  EXAMINATION UNDER ANESTHESIA N/A 10/16/2012   Procedure: EXAM UNDER ANESTHESIA;  Surgeon: 10/18/2012, MD;  Location: [facility];  Service: Gynecology;  Laterality: N/A;  Suturing Vaginal Cuff  . HAND SURGERY Right    carpal tunnel  . LAPAROSCOPIC ASSISTED VAGINAL HYSTERECTOMY N/A 09/29/2012   Procedure: LAPAROSCOPIC ASSISTED VAGINAL HYSTERECTOMY;  Surgeon: 10/01/2012, MD;  Location: [facility];  Service: Gynecology;  Laterality: N/A;  . LAPAROSCOPIC HYSTERECTOMY Bilateral 09/29/2012   Procedure: HYSTERECTOMY TOTAL LAPAROSCOPIC;  Surgeon: 10/01/2012, MD;  Location: [facility];  Service: Gynecology;  Laterality: Bilateral;  Total Laparoscopic Hysterectomy, Bilateral Salpingectomies, Cystoscopy poss. LAVH poss. TAH  . LAPAROSCOPY     x3  . LUMBAR LAMINECTOMY N/A 11/05/2014   Procedure: RIGHT L4-5 MICRODISCECTOMY ;  Surgeon: 01/05/2015, MD;  [facility];  Service: Orthopedics;  Laterality: N/A;  . LUMBAR LAMINECTOMY/DECOMPRESSION MICRODISCECTOMY N/A 04/10/2016   Procedure: Right L4-5 Microdiscectomy;  Surgeon: 06/08/2016, MD;  Location: [facility];  Service: Orthopedics;  Laterality: N/A;  . TONSILLECTOMY     T & A  . TRANSFORAMINAL LUMBAR INTERBODY FUSION (TLIF) WITH PEDICLE SCREW FIXATION 1 LEVEL N/A  02/13/2017   Procedure: Transforaminal Lumbar Interbody Fusion - Lumbar four-five;  Surgeon: Tia AlertJones, David S, MD;  [facility];  Service: Neurosurgery;  Laterality: N/A;  . TUBAL LIGATION      There were no vitals filed for this visit.  Subjective Assessment - 02/24/19 1450    Subjective  Fell 6 days ago, slipped on leaves, hit elbow and knee, jarred upper body.    Currently in Pain?  Yes    Pain Score  0-No pain    Pain Orientation  Right    Pain Descriptors / Indicators  Aching    Pain Type  Chronic pain    Pain Onset  More than a month ago    Pain Frequency  Intermittent    Aggravating Factors   lifting, using arms, walking, stairs    Pain Relieving Factors  medicine             [facility] Adult PT Treatment/Exercise - 02/24/19 0001      Neck Exercises: Machines for Strengthening   UBE (Upper Arm Bike)  5 min L1       Neck Exercises: Standing   Other Standing Exercises  row, extension yellow band x 15 with cues       Neck Exercises: Seated   Neck Retraction  15 reps    Cervical Rotation  Both;5 reps    Other Seated Exercise  Scap squeeze x15;       Neck Exercises: Supine   Neck Retraction  10 reps;5 secs    Other Supine Exercise  Supine AAROM shoulder flexion with cane x10;       Shoulder Exercises: Supine   Horizontal ABduction  Strengthening;Both;15 reps    Theraband Level (Shoulder Horizontal ABduction)  Level 1 (Yellow)      Modalities   Modalities  Electrical Stimulation;Moist Heat      Moist Heat Therapy   Number Minutes Moist Heat  15 Minutes    Moist Heat Location  Shoulder;Cervical      Electrical Stimulation   Electrical Stimulation Location  post neck and shoulders    Electrical Stimulation Action  IFC    Electrical Stimulation Parameters  to tol (7)    Electrical Stimulation Goals  Pain      Neck Exercises: Stretches   Upper Trapezius Stretch  2 reps;30 seconds;Right;Left    Other Neck Stretches  cervical rotation x 3 each side avoiding end range pain              PT Education - 02/24/19 1502    Education Details  posture, HEP, stabilization    Person(s) Educated  Patient    Methods  Explanation    Comprehension  Verbalized understanding;Returned demonstration       PT Short Term Goals - 02/24/19 1511      PT SHORT TERM GOAL #1   Title  Pt to be independent with initial HEP    Status  On-going      PT SHORT TERM GOAL #2   Title  Pt to report decreased pain to 0-4/10 in upper trap and neck region.    Status  On-going      PT SHORT TERM GOAL #3   Title  Pt to demo imroved ability for full shoulder elevation on L, to improve ability for IADLs.    Status  On-going      PT SHORT TERM GOAL #4   Title  Pt to  demo improved shoulder strength to at least 4/5  to improve ability for reaching, lifting, carrying. and work duties    Status  On-going               Plan - 02/24/19 1453    Clinical Impression Statement  Pt wants to know why she keeps falling.  Pain went from none to 7/10 with exercises with light bands.  Trial of IFC for chronic pain.  Needs to take time to do HEP vs just walk and work.    PT Treatment/Interventions  ADLs/Self Care Home Management;Cryotherapy;Electrical Stimulation;Ultrasound;Traction;Moist Heat;Iontophoresis 4mg /ml Dexamethasone;Functional mobility training;Therapeutic activities;Therapeutic exercise;Neuromuscular re-education;Patient/family education;Passive range of motion;Joint Manipulations;Spinal Manipulations;Vasopneumatic Device;Taping    PT Next Visit Plan  Manual for UT tightness and soreness. Shoulder AROM, light strength, posture education and strengthening.    PT Home Exercise Plan  Supine shoulder flexion with dowel, scap retraction, cervical retraction, cervical side bend stretch    Consulted and Agree with Plan of Care  Patient       Patient will benefit from skilled therapeutic intervention in order to improve the following deficits and impairments:  Decreased range of motion, Impaired UE functional use, Decreased activity tolerance, Improper body mechanics, Decreased mobility, Decreased strength  Visit Diagnosis: Cervicalgia  Acute pain of left shoulder     Problem List Patient Active Problem List   Diagnosis Date Noted  . S/P lumbar spinal fusion 02/13/2017  . Herniated nucleus pulposus, lumbar 04/10/2016  . HNP (herniated nucleus pulposus), lumbar 11/05/2014    Class: Chronic  . S/P endometrial ablation - HTA 2012 06/03/2012  . Sterilization - h/o BTL 06/03/2012  . OBESITY 09/03/2007  . MIGRAINE HEADACHE 09/03/2007    PAA,JENNIFER 02/24/2019, 4:37 PM  [facility] [address] [place], [place], [address] Phone: [phone]   Fax:  [phone]  Name: MEI SUITS MRN: [id number] Date of Birth: April 04, 1985  Raeford Razor, PT 02/24/19 4:37 PM Phone: [phone] Fax: [phone]

## 2019-02-24 NOTE — Patient Instructions (Signed)
Resisted Horizontal Abduction: Bilateral    Sit or stand, tubing in both hands, arms out in front. Keeping arms straight, pinch shoulder blades together and stretch arms out. Repeat ___15_ times per set. Do ____2 sets per session. Do ___2_ sessions per day.  http://orth.exer.us/969   Copyright  VHI. All rights reserved.   Low Row: Standing    Face anchor, feet shoulder width apart. Palms up, pull arms back, squeezing shoulder blades together. Repeat _20_ times per set. Do _2_ sets per session. Do _5_ sessions per week. Anchor Height: Waist  http://tub.exer.us/66   Copyright  VHI. All rights reserved. EXTENSION: Standing - Resistance Band: Stable (Active)    Stand, right arm at side. Against yellow resistance band, draw arm backward, as far as possible, keeping elbow straight. Complete __2_ sets of __15_ repetitions. Perform _1__ sessions per day.  Copyright  VHI. All rights reserved.    TENS UNIT  This is helpful for muscle pain and spasm.   Search and Purchase a TENS 7000 2nd edition at www.tenspros.com or www.amazon.com  (It should be less than $30)     TENS unit instructions:   Do not shower or bathe with the unit on  Turn the unit off before removing electrodes or batteries  If the electrodes lose stickiness add a drop of water to the electrodes after they are disconnected from the unit and place on plastic sheet. If you continued to have difficulty, call the TENS unit company to purchase more electrodes.  Do not apply lotion on the skin area prior to use. Make sure the skin is clean and dry as this will help prolong the life of the electrodes.  After use, always check skin for unusual red areas, rash or other skin difficulties. If there are any skin problems, does not apply electrodes to the same area.  Never remove the electrodes from the unit by pulling the wires.  Do not use the TENS unit or electrodes other than as directed.  Do not change electrode  placement without consulting your therapist or physician.  Keep 2 fingers with between each electrode.

## 2019-02-25 ENCOUNTER — Ambulatory Visit: Payer: Medicaid Other | Admitting: Physical Therapy

## 2019-02-25 ENCOUNTER — Other Ambulatory Visit: Payer: Self-pay

## 2019-02-25 ENCOUNTER — Ambulatory Visit (INDEPENDENT_AMBULATORY_CARE_PROVIDER_SITE_OTHER): Payer: Medicaid Other | Admitting: Neurology

## 2019-02-25 DIAGNOSIS — R202 Paresthesia of skin: Secondary | ICD-10-CM | POA: Diagnosis not present

## 2019-02-25 DIAGNOSIS — G5621 Lesion of ulnar nerve, right upper limb: Secondary | ICD-10-CM

## 2019-02-25 DIAGNOSIS — G5603 Carpal tunnel syndrome, bilateral upper limbs: Secondary | ICD-10-CM

## 2019-02-25 NOTE — Procedures (Signed)
[facility] Neurology  [address]  [place], [place] [address] Tel: [phone] Fax:  [phone] Test Date:  02/25/2019  Patient: Yvonne [name] DOB: 07-20-1985 Physician: Narda Amber, DO  Sex: Female Height: 5\' 0"  Ref Phys: Leverne Humbles, PA-C  ID#: [id number] Temp: 35.0C Technician:    Patient Complaints: This is a 33 year old female referred for evaluation of right hand pain.  NCV & EMG Findings: Extensive electrodiagnostic testing of the right upper extremity and additional studies of the left shows:  1. Right mixed palmar sensory response is mildly prolonged.  Left median sensory response shows prolonged latency (3.6 ms).  Right median and bilateral ulnar sensory responses are within normal limits. 2. Right ulnar motor response shows slowed conduction velocity across the elbow (A Elbow-B Elbow, 33 m/s).  Bilateral median and left ulnar motor responses are within normal limits.   3. There is no evidence of active or chronic motor axonal loss changes affecting any of the tested muscles.  Motor unit configuration and recruitment pattern is within normal limits.    Impression: 1. Right ulnar neuropathy with slowing across the elbow, purely demyelinating, mild-to-moderate. 2. Right median neuropathy at or distal to the wrist (very mild), consistent with a clinical diagnosis of carpal tunnel syndrome.   3. The residuals of the previously treated left carpal tunnel syndrome are seen, findings are mild.     ___________________________ Narda Amber, DO    Nerve Conduction Studies Anti Sensory Summary Table   Site NR Peak (ms) Norm Peak (ms) P-T Amp (V) Norm P-T Amp  Left Median Anti Sensory (2nd Digit)  Wrist    3.6 <3.4 56.0 >20  Right Median Anti Sensory (2nd Digit)  Wrist    3.0 <3.4 62.1 >20  Left Ulnar Anti Sensory (5th Digit)  35C  Wrist    2.6 <3.1 34.5 >12  Right Ulnar Anti Sensory (5th Digit)  Wrist    2.3 <3.1 27.6 >12   Motor Summary Table   Site NR Onset (ms) Norm Onset (ms) O-P Amp (mV) Norm O-P Amp Site1 Site2 Delta-0 (ms) Dist (cm) Vel (m/s) Norm Vel (m/s)  Left Median Motor (Abd Poll Brev)  35C  Wrist    3.8 <3.9 12.5 >6 Elbow Wrist 4.7 27.0 57 >50  Elbow    8.5  12.4         Right Median Motor (Abd Poll Brev)  Wrist    3.3 <3.9 13.8 >6 Elbow Wrist 4.4 28.0 64 >50  Elbow    7.7  13.4         Left Ulnar Motor (Abd Dig Minimi)  35C  Wrist    1.9 <3.1 11.5 >7 B Elbow Wrist 4.0 23.0 58 >50  B Elbow    5.9  11.0  A Elbow B Elbow 2.0 10.0 50 >50  A Elbow    7.9  10.7         Right Ulnar Motor (Abd Dig Minimi)  Wrist    2.2 <3.1 10.8 >7 B Elbow Wrist 3.5 22.0 63 >50  B Elbow    5.7  9.6  A Elbow B Elbow 3.0 10.0 33 >50  A Elbow    8.7  8.8          Comparison Summary Table   Site NR Peak (ms) Norm Peak (ms) P-T Amp (V) Site1 Site2 Delta-P (ms) Norm Delta (ms)  Right Median/Ulnar Palm Comparison (Wrist - 8cm)  Median Palm    1.9 <2.2 27.8 Median  Palm Ulnar Palm 0.6   Ulnar Palm    1.3 <2.2 20.2       EMG   Side Muscle Ins Act Fibs Psw Fasc Number Recrt Dur Dur. Amp Amp. Poly Poly. Comment  Right 1stDorInt Nml Nml Nml Nml Nml Nml Nml Nml Nml Nml Nml Nml N/A  Right Abd Poll Brev Nml Nml Nml Nml Nml Nml Nml Nml Nml Nml Nml Nml N/A  Right PronatorTeres Nml Nml Nml Nml Nml Nml Nml Nml Nml Nml Nml Nml N/A  Right Biceps Nml Nml Nml Nml Nml Nml Nml Nml Nml Nml Nml Nml N/A  Right Deltoid Nml Nml Nml Nml Nml Nml Nml Nml Nml Nml Nml Nml N/A  Right Triceps Nml Nml Nml Nml Nml Nml Nml Nml Nml Nml Nml Nml N/A  Right ABD Dig Min Nml Nml Nml Nml Nml Nml Nml Nml Nml Nml Nml Nml N/A  Right FlexCarpiUln Nml Nml Nml Nml Nml Nml Nml Nml Nml Nml Nml Nml N/A  Left 1stDorInt Nml Nml Nml Nml Nml Nml Nml Nml Nml Nml Nml Nml N/A  Left PronatorTeres Nml Nml Nml Nml Nml Nml Nml Nml Nml Nml Nml Nml N/A  Left Abd Poll Brev Nml Nml Nml Nml Nml Nml Nml Nml Nml Nml Nml Nml N/A      Waveforms:

## 2019-03-03 ENCOUNTER — Other Ambulatory Visit: Payer: Self-pay

## 2019-03-03 MED ORDER — HYDROCODONE-ACETAMINOPHEN 5-325 MG PO TABS: 1.0000 | ORAL_TABLET | Freq: Four times a day (QID) | ORAL | 0 refills | Status: DC | PRN

## 2019-03-03 NOTE — Telephone Encounter (Signed)
Sent request to Dr. Nitka 

## 2019-03-03 NOTE — Telephone Encounter (Signed)
Patient needs refill of the Hydrocodone  [facility]

## 2019-03-05 ENCOUNTER — Ambulatory Visit: Payer: Medicaid Other | Attending: Specialist | Admitting: Physical Therapy

## 2019-03-05 ENCOUNTER — Other Ambulatory Visit: Payer: Self-pay

## 2019-03-05 DIAGNOSIS — M542 Cervicalgia: Secondary | ICD-10-CM | POA: Diagnosis present

## 2019-03-05 DIAGNOSIS — M25512 Pain in left shoulder: Secondary | ICD-10-CM

## 2019-03-05 NOTE — Patient Instructions (Signed)

## 2019-03-05 NOTE — Therapy (Addendum)
[place], [place], [address] Phone: [phone]   Fax:  [phone]  Physical Therapy Treatment/Discharge  Patient Details  Name: Yvonne [name] MRN: [id number] Date of Birth: Jun 04, 1985 Referring Provider (PT): Basil Dess   Encounter Date: 03/05/2019  PT End of Session - 03/05/19 1741    Visit Number  3    Number of Visits  4    Date for PT Re-Evaluation  03/11/19    Authorization Type  MCD    Authorization - Visit Number  2    Authorization - Number of Visits  3    PT Start Time  6333    PT Stop Time  1745    PT Time Calculation (min)  40 min    Activity Tolerance  Patient tolerated treatment well    Behavior During Therapy  [facility] for tasks assessed/performed       Past Medical History:  Diagnosis Date  . Anemia   . Anxiety   . 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 Asthma    albuterol inhaler  . Blood transfusion    2008 with c/s 4 units transfused  . Carpal tunnel syndrome on right   . Chronic back pain   . Depression   . Dyspnea    exertion  . Headache(784.0)    otc ibuprofen 826m  . Heart murmur    as a child  . Obesity   . Pneumonia    as a child    Past Surgical History:  Procedure Laterality Date  . ABDOMINAL HYSTERECTOMY N/A 09/29/2012   Procedure: HYSTERECTOMY ABDOMINAL;  Surgeon: ADelice Lesch MD;  [facility];  Service: Gynecology;  Laterality: N/A;  . APPENDECTOMY    . BACK SURGERY    . BILATERAL SALPINGECTOMY Bilateral 09/29/2012   Procedure: BILATERAL SALPINGECTOMY;  Surgeon: ADelice Lesch MD;  [place];  Service: Gynecology;  Laterality: Bilateral;  . CESAREAN SECTION    . CHOLECYSTECTOMY    . CYSTOSCOPY Bilateral 09/29/2012   Procedure: CYSTOSCOPY;  Surgeon: ADelice Lesch MD;  [place];  Service: Gynecology;  Laterality: Bilateral;  . ENDOMETRIAL ABLATION    . EXAMINATION UNDER ANESTHESIA N/A  10/16/2012   Procedure: EXAM UNDER ANESTHESIA;  Surgeon: ADelice Lesch MD;  [place];  Service: Gynecology;  Laterality: N/A;  Suturing Vaginal Cuff  . HAND SURGERY Right    carpal tunnel  . LAPAROSCOPIC ASSISTED VAGINAL HYSTERECTOMY N/A 09/29/2012   Procedure: LAPAROSCOPIC ASSISTED VAGINAL HYSTERECTOMY;  Surgeon: ADelice Lesch MD;  [facility];  Service: Gynecology;  Laterality: N/A;  . LAPAROSCOPIC HYSTERECTOMY Bilateral 09/29/2012   Procedure: HYSTERECTOMY TOTAL LAPAROSCOPIC;  Surgeon: ADelice Lesch MD;  [facility];  Service: Gynecology;  Laterality: Bilateral;  Total Laparoscopic Hysterectomy, Bilateral Salpingectomies, Cystoscopy poss. LAVH poss. TAH  . LAPAROSCOPY     x3  . LUMBAR LAMINECTOMY N/A 11/05/2014   Procedure: RIGHT L4-5 MICRODISCECTOMY ;  Surgeon: JJessy Oto MD;  [place] [facility]  Service: Orthopedics;  Laterality: N/A;  . LUMBAR LAMINECTOMY/DECOMPRESSION MICRODISCECTOMY N/A 04/10/2016   Procedure: Right L4-5 Microdiscectomy;  Surgeon: JJessy Oto MD;  [facility] [facility]  Service: Orthopedics;  Laterality: N/A;  . TONSILLECTOMY     T & A  . TRANSFORAMINAL LUMBAR INTERBODY FUSION (TLIF) WITH PEDICLE SCREW FIXATION 1 LEVEL N/A 02/13/2017   Procedure: Transforaminal Lumbar Interbody Fusion - Lumbar  has limited ROM in Cervical spine to about 35 deg each way.  Lateral flexion is WNL.  Did not tolerate manual and does not want traction.  At this point I referred her back to her MD for guidance.  She will contact us after she has that appt in 2 weeks.  Did respond to IFC so i gave her info on a home TENS unit.    PT Treatment/Interventions  ADLs/Self Care Home Management;Cryotherapy;Electrical Stimulation;Ultrasound;Traction;Moist Heat;Iontophoresis 34m/ml Dexamethasone;Functional mobility training;Therapeutic activities;Therapeutic exercise;Neuromuscular re-education;Patient/family education;Passive range of motion;Joint Manipulations;Spinal Manipulations;Vasopneumatic Device;Taping    PT Next Visit Plan  Re-auth visits if she calls to RS    PT Home Exercise Plan  Supine shoulder flexion with dowel, scap retraction, cervical retraction, cervical side bend stretch    Consulted and Agree with Plan of Care  Patient       Patient will benefit from skilled therapeutic intervention in order to improve the following deficits and impairments:     Visit Diagnosis: Cervicalgia  Acute pain of left shoulder     Problem List Patient Active Problem List   Diagnosis Date Noted  . S/P lumbar spinal fusion 02/13/2017  . Herniated nucleus pulposus, lumbar 04/10/2016  . HNP (herniated nucleus pulposus), lumbar 11/05/2014    Class: Chronic  . S/P endometrial ablation - HTA 2012 06/03/2012  . Sterilization - h/o BTL 06/03/2012  . OBESITY 09/03/2007  . MIGRAINE HEADACHE 09/03/2007    Sylvio Weatherall 03/05/2019, 5:49 PM  [facility] [address]Phone: [phone]  Fax:  [phone] Name: Yvonne COSNERMRN: [id number] of Birth: 1May 10, 1987 JRaeford Razor PT 03/05/19 5:49 PM Phone: [phone]: [phone] PHYSICAL THERAPY DISCHARGE SUMMARY  Visits from Start of Care: 3  Current  functional level related to goals / functional outcomes: See above    Remaining deficits: See above, pain, weakness   Education / Equipment: HEP, posture, symptom mgmt  Plan: Patient agrees to discharge.  Patient goals were not met. Patient is being discharged due to not returning since the last visit.  ?????   Pt has MRI scheduled.   JRaeford Razor PT 04/13/19 9:10 AM Phone: [phone]: [phone]  has limited ROM in Cervical spine to about 35 deg each way.  Lateral flexion is WNL.  Did not tolerate manual and does not want traction.  At this point I referred her back to her MD for guidance.  She will contact us after she has that appt in 2 weeks.  Did respond to IFC so i gave her info on a home TENS unit.    PT Treatment/Interventions  ADLs/Self Care Home Management;Cryotherapy;Electrical Stimulation;Ultrasound;Traction;Moist Heat;Iontophoresis 34m/ml Dexamethasone;Functional mobility training;Therapeutic activities;Therapeutic exercise;Neuromuscular re-education;Patient/family education;Passive range of motion;Joint Manipulations;Spinal Manipulations;Vasopneumatic Device;Taping    PT Next Visit Plan  Re-auth visits if she calls to RS    PT Home Exercise Plan  Supine shoulder flexion with dowel, scap retraction, cervical retraction, cervical side bend stretch    Consulted and Agree with Plan of Care  Patient       Patient will benefit from skilled therapeutic intervention in order to improve the following deficits and impairments:     Visit Diagnosis: Cervicalgia  Acute pain of left shoulder     Problem List Patient Active Problem List   Diagnosis Date Noted  . S/P lumbar spinal fusion 02/13/2017  . Herniated nucleus pulposus, lumbar 04/10/2016  . HNP (herniated nucleus pulposus), lumbar 11/05/2014    Class: Chronic  . S/P endometrial ablation - HTA 2012 06/03/2012  . Sterilization - h/o BTL 06/03/2012  . OBESITY 09/03/2007  . MIGRAINE HEADACHE 09/03/2007    Sylvio Weatherall 03/05/2019, 5:49 PM  [facility] [address]Phone: [phone]  Fax:  [phone] Name: Yvonne COSNERMRN: [id number] of Birth: 1May 10, 1987 JRaeford Razor PT 03/05/19 5:49 PM Phone: [phone]: [phone] PHYSICAL THERAPY DISCHARGE SUMMARY  Visits from Start of Care: 3  Current  functional level related to goals / functional outcomes: See above    Remaining deficits: See above, pain, weakness   Education / Equipment: HEP, posture, symptom mgmt  Plan: Patient agrees to discharge.  Patient goals were not met. Patient is being discharged due to not returning since the last visit.  ?????   Pt has MRI scheduled.   JRaeford Razor PT 04/13/19 9:10 AM Phone: [phone]: [phone]

## 2019-03-12 ENCOUNTER — Encounter: Payer: Medicaid Other | Admitting: Physical Therapy

## 2019-03-18 ENCOUNTER — Ambulatory Visit: Payer: Medicaid Other | Admitting: Surgery

## 2019-03-19 ENCOUNTER — Telehealth: Payer: Self-pay | Admitting: Specialist

## 2019-03-19 ENCOUNTER — Encounter: Payer: Self-pay | Admitting: Surgery

## 2019-03-19 ENCOUNTER — Other Ambulatory Visit: Payer: Self-pay

## 2019-03-19 ENCOUNTER — Ambulatory Visit (INDEPENDENT_AMBULATORY_CARE_PROVIDER_SITE_OTHER): Payer: Medicaid Other | Admitting: Surgery

## 2019-03-19 VITALS — BP 107/76 | HR 75 | Ht 60.0 in | Wt 239.0 lb

## 2019-03-19 DIAGNOSIS — M542 Cervicalgia: Secondary | ICD-10-CM

## 2019-03-19 NOTE — Progress Notes (Signed)
Office Visit Note   Patient: Yvonne [name]           Date of Birth: Aug 01, 1985           MRN: [id number] Visit Date: 03/19/2019              Requested by: [facility] [address] [place],  [place] [address] PCP: [facility]   Assessment & Plan: Visit Diagnoses:  1. Cervicalgia     Plan: Since patient has not had any improvement of her neck pain and shoulder pain with multiple PT visits I recommend repeating cervical spine MRI to see if this is worsened from her previous study 6 months ago.  Follow-up with Dr. Otelia Sergeant after completion to discuss results and further treatment options.  Patient was checking out she asked the front desk receptionist about pain medication and I advised that these will not be given today.  Dr. Otelia Sergeant can decide at follow-up if narcotic medication is indicated when he reviews the study.  Patient is also seen Dr. Dairl Ponder hand surgeon for issues with right arm.  Follow-Up Instructions: Return in about 3 weeks (around 04/09/2019) for rov with Dr Otelia Sergeant to review cervical MRI .   Orders:  No orders of the defined types were placed in this encounter.  No orders of the defined types were placed in this encounter.     Procedures: No procedures performed   Clinical Data: No additional findings.   Subjective: Chief Complaint  Patient presents with  . Lower Back - Follow-up  . Left Shoulder - Follow-up    HPI 33 year old black female returns for recheck of her neck pain and bilateral shoulder pain.  Patient has been going to formal PT and the therapy was discontinued treatment since patient was not getting any better.  She continues to complain of ongoing neck pain that radiates into the bilateral shoulders and scapular area.  Pain is described as being constant and aggravated with turning movements of her head.  Patient states that symptoms have worsened since her previous cervical MRI September 13, 2018. Review of  Systems No current cardiac pulmonary GI GU issues  Objective: Vital Signs: BP 107/76   Pulse 75   Ht 5' (1.524 m)   Wt 239 lb (108.4 kg)   LMP 07/25/2012   BMI 46.68 kg/m   Physical Exam Pulmonary:     Effort: No respiratory distress.  Musculoskeletal:     Comments: Patient has some decrease in cervical spine range of motion due to discomfort.  Positive bilateral brachial plexus trapezius and scapular tenderness.  Neurologically intact.  Neurological:     General: No focal deficit present.     Mental Status: She is oriented to person, place, and time.     Ortho Exam  Specialty Comments:  No specialty comments available.  Imaging: No results found.   PMFS History: Patient Active Problem List   Diagnosis Date Noted  . S/P lumbar spinal fusion 02/13/2017  . Herniated nucleus pulposus, lumbar 04/10/2016  . HNP (herniated nucleus pulposus), lumbar 11/05/2014    Class: Chronic  . S/P endometrial ablation - HTA 2012 06/03/2012  . Sterilization - h/o BTL 06/03/2012  . OBESITY 09/03/2007  . MIGRAINE HEADACHE 09/03/2007   Past Medical History:  Diagnosis Date  . Anemia   . Anxiety   . 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 Asthma  albuterol inhaler  . Blood transfusion    2008 with c/s 4 units transfused  . Carpal tunnel syndrome on right   . Chronic back pain   . Depression   . Dyspnea    exertion  . Headache(784.0)    otc ibuprofen 800mg   . Heart murmur    as a child  . Obesity   . Pneumonia    as a child    Family History  Problem Relation Age of Onset  . Diabetes Mother   . Diabetes Maternal Grandmother   . Stroke Father     Past Surgical History:  Procedure Laterality Date  . ABDOMINAL HYSTERECTOMY N/A 09/29/2012   Procedure: HYSTERECTOMY ABDOMINAL;  Surgeon: Delice Lesch, MD;  Location: [facility];  Service: Gynecology;  Laterality: N/A;  . APPENDECTOMY    . BACK SURGERY    . BILATERAL SALPINGECTOMY Bilateral  09/29/2012   Procedure: BILATERAL SALPINGECTOMY;  Surgeon: Delice Lesch, MD;  Location: [facility];  Service: Gynecology;  Laterality: Bilateral;  . CESAREAN SECTION    . CHOLECYSTECTOMY    . CYSTOSCOPY Bilateral 09/29/2012   Procedure: CYSTOSCOPY;  Surgeon: Delice Lesch, MD;  Location: [facility];  Service: Gynecology;  Laterality: Bilateral;  . ENDOMETRIAL ABLATION    . EXAMINATION UNDER ANESTHESIA N/A 10/16/2012   Procedure: EXAM UNDER ANESTHESIA;  Surgeon: Delice Lesch, MD;  Location: [facility];  Service: Gynecology;  Laterality: N/A;  Suturing Vaginal Cuff  . HAND SURGERY Right    carpal tunnel  . LAPAROSCOPIC ASSISTED VAGINAL HYSTERECTOMY N/A 09/29/2012   Procedure: LAPAROSCOPIC ASSISTED VAGINAL HYSTERECTOMY;  Surgeon: Delice Lesch, MD;  Location: [facility];  Service: Gynecology;  Laterality: N/A;  . LAPAROSCOPIC HYSTERECTOMY Bilateral 09/29/2012   Procedure: HYSTERECTOMY TOTAL LAPAROSCOPIC;  Surgeon: Delice Lesch, MD;  Location: [facility];  Service: Gynecology;  Laterality: Bilateral;  Total Laparoscopic Hysterectomy, Bilateral Salpingectomies, Cystoscopy poss. LAVH poss. TAH  . LAPAROSCOPY     x3  . LUMBAR LAMINECTOMY N/A 11/05/2014   Procedure: RIGHT L4-5 MICRODISCECTOMY ;  Surgeon: Jessy Oto, MD;  Location: [place];  Service: Orthopedics;  Laterality: N/A;  . LUMBAR LAMINECTOMY/DECOMPRESSION MICRODISCECTOMY N/A 04/10/2016   Procedure: Right L4-5 Microdiscectomy;  Surgeon: Jessy Oto, MD;  Location: [place];  Service: Orthopedics;  Laterality: N/A;  . TONSILLECTOMY     T & A  . TRANSFORAMINAL LUMBAR INTERBODY FUSION (TLIF) WITH PEDICLE SCREW FIXATION 1 LEVEL N/A 02/13/2017   Procedure: Transforaminal Lumbar Interbody Fusion - Lumbar four-five;  Surgeon: Eustace Moore, MD;  Location: [place];  Service: Neurosurgery;  Laterality: N/A;  . TUBAL LIGATION     Social History   Occupational History  . Not on file  Tobacco Use  . Smoking status: Former Smoker    Packs/day: 0.00     Types: Cigarettes    Quit date: 10/17/2015    Years since quitting: 3.4  . Smokeless tobacco: Never Used  . Tobacco comment: 1-2 cigarettes a day  Substance and Sexual Activity  . Alcohol use: Yes    Alcohol/week: 1.0 standard drinks    Types: 1 Glasses of wine per week    Comment: ocassionally  . Drug use: No  . Sexual activity: Yes    Birth control/protection: Surgical, Condom    Comment: surgical tubal

## 2019-03-19 NOTE — Telephone Encounter (Signed)
Patient stated will call back after her surgery f/u for MRI REVIEW

## 2019-04-16 ENCOUNTER — Ambulatory Visit
Admission: RE | Admit: 2019-04-16 | Discharge: 2019-04-16 | Disposition: A | Discharge status: Home / Self Care | Payer: Medicaid Other | Source: Ambulatory Visit | Attending: Surgery | Admitting: Surgery

## 2019-04-16 DIAGNOSIS — M542 Cervicalgia: Secondary | ICD-10-CM

## 2019-04-22 ENCOUNTER — Ambulatory Visit (INDEPENDENT_AMBULATORY_CARE_PROVIDER_SITE_OTHER): Payer: Medicaid Other | Admitting: Specialist

## 2019-04-22 ENCOUNTER — Other Ambulatory Visit: Payer: Self-pay

## 2019-04-22 ENCOUNTER — Encounter: Payer: Self-pay | Admitting: Specialist

## 2019-04-22 VITALS — BP 145/85 | HR 95 | Ht 60.0 in | Wt 239.0 lb

## 2019-04-22 DIAGNOSIS — M542 Cervicalgia: Secondary | ICD-10-CM | POA: Diagnosis not present

## 2019-04-22 DIAGNOSIS — G8929 Other chronic pain: Secondary | ICD-10-CM

## 2019-04-22 DIAGNOSIS — R29898 Other symptoms and signs involving the musculoskeletal system: Secondary | ICD-10-CM | POA: Diagnosis not present

## 2019-04-22 DIAGNOSIS — E010 Iodine-deficiency related diffuse (endemic) goiter: Secondary | ICD-10-CM

## 2019-04-22 DIAGNOSIS — M5442 Lumbago with sciatica, left side: Secondary | ICD-10-CM | POA: Diagnosis not present

## 2019-04-22 MED ORDER — TIZANIDINE HCL 4 MG PO TABS: 4.0000 mg | ORAL_TABLET | Freq: Four times a day (QID) | ORAL | 1 refills | Status: DC | PRN

## 2019-04-22 MED ORDER — HYDROCODONE-ACETAMINOPHEN 5-325 MG PO TABS: 1.0000 | ORAL_TABLET | Freq: Four times a day (QID) | ORAL | 0 refills | Status: DC | PRN

## 2019-04-22 NOTE — Patient Instructions (Addendum)
Avoid overhead lifting and overhead use of the arms. Do not lift greater than 5 lbs. Adjust head rest in vehicle to prevent hyperextension if rear ended. Take extra precautions to avoid falling, including use of a cane if you feel weak. Avoid frequent bending and stooping  No lifting greater than 10 lbs. May use ice or moist heat for pain. Weight loss is of benefit. Best medication for lumbar disc disease is arthritis medications like motrin, celebrex and naprosyn You are unable to take these due to allergy to aspirin. Exercise is important to improve your endurance and does allow people to function better inspite of back pain.  Fall Prevention and Home Safety Falls cause injuries and can affect all age groups. It is possible to use preventive measures to significantly decrease the likelihood of falls. There are many simple measures which can make your home safer and prevent falls. OUTDOORS  Repair cracks and edges of walkways and driveways.  Remove high doorway thresholds.  Trim shrubbery on the main path into your home.  Have good outside lighting.  Clear walkways of tools, rocks, debris, and clutter.  Check that handrails are not broken and are securely fastened. Both sides of steps should have handrails.  Have leaves, snow, and ice cleared regularly.  Use sand or salt on walkways during winter months.  In the garage, clean up grease or oil spills. BATHROOM  Install night lights.  Install grab bars by the toilet and in the tub and shower.  Use non-skid mats or decals in the tub or shower.  Place a plastic non-slip stool in the shower to sit on, if needed.  Keep floors dry and clean up all water on the floor immediately.  Remove soap buildup in the tub or shower on a regular basis.  Secure bath mats with non-slip, double-sided rug tape.  Remove throw rugs and tripping hazards from the floors. BEDROOMS  Install night lights.  Make sure a bedside light is easy to  reach.  Do not use oversized bedding.  Keep a telephone by your bedside.  Have a firm chair with side arms to use for getting dressed.  Remove throw rugs and tripping hazards from the floor. KITCHEN  Keep handles on pots and pans turned toward the center of the stove. Use back burners when possible.  Clean up spills quickly and allow time for drying.  Avoid walking on wet floors.  Avoid hot utensils and knives.  Position shelves so they are not too high or low.  Place commonly used objects within easy reach.  If necessary, use a sturdy step stool with a grab bar when reaching.  Keep electrical cables out of the way.  Do not use floor polish or wax that makes floors slippery. If you must use wax, use non-skid floor wax.  Remove throw rugs and tripping hazards from the floor. STAIRWAYS  Never leave objects on stairs.  Place handrails on both sides of stairways and use them. Fix any loose handrails. Make sure handrails on both sides of the stairways are as long as the stairs.  Check carpeting to make sure it is firmly attached along stairs. Make repairs to worn or loose carpet promptly.  Avoid placing throw rugs at the top or bottom of stairways, or properly secure the rug with carpet tape to prevent slippage. Get rid of throw rugs, if possible.  Have an electrician put in a light switch at the top and bottom of the stairs. OTHER FALL PREVENTION TIPS  Wear low-heel or rubber-soled shoes that are supportive and fit well. Wear closed toe shoes.  When using a stepladder, make sure it is fully opened and both spreaders are firmly locked. Do not climb a closed stepladder.  Add color or contrast paint or tape to grab bars and handrails in your home. Place contrasting color strips on first and last steps.  Learn and use mobility aids as needed. Install an electrical emergency response system.  Turn on lights to avoid dark areas. Replace light bulbs that burn out immediately.  Get light switches that glow.  Arrange furniture to create clear pathways. Keep furniture in the same place.  Firmly attach carpet with non-skid or double-sided tape.  Eliminate uneven floor surfaces.  Select a carpet pattern that does not visually hide the edge of steps.  Be aware of all pets. OTHER HOME SAFETY TIPS  Set the water temperature for 120 F (48.8 C).  Keep emergency numbers on or near the telephone.  Keep smoke detectors on every level of the home and near sleeping areas. Document Released: 03/09/2002 Document Revised: 09/18/2011 Document Reviewed: 06/08/2011 [facility] Patient Information 2014 [place], [place].

## 2019-04-22 NOTE — Progress Notes (Addendum)
Office Visit Note   Patient: Yvonne [name]           Date of Birth: [birth year]           MRN: [id number] Visit Date: 04/22/2019              Requested by: [facility] [address] [place],  [place] [address] PCP: [facility]   Assessment & Plan: Visit Diagnoses:  1. Thyromegaly   2. Cervicalgia   3. Bilateral arm weakness   4. Chronic left-sided low back pain with left-sided sciatica     Plan: Avoid overhead lifting and overhead use of the arms. Do not lift greater than 5 lbs. Adjust head rest in vehicle to prevent hyperextension if rear ended. Take extra precautions to avoid falling, including use of a cane if you feel weak. Avoid frequent bending and stooping  No lifting greater than 10 lbs. May use ice or moist heat for pain. Weight loss is of benefit. Best medication for lumbar disc disease is arthritis medications like motrin, celebrex and naprosyn You are unable to take these due to allergy to aspirin. Exercise is important to improve your endurance and does allow people to function better inspite of back pain.   Fall Prevention and Home Safety Falls cause injuries and can affect all age groups. It is possible to use preventive measures to significantly decrease the likelihood of falls. There are many simple measures which can make your home safer and prevent falls. OUTDOORS  Repair cracks and edges of walkways and driveways.  Remove high doorway thresholds.  Trim shrubbery on the main path into your home.  Have good outside lighting.  Clear walkways of tools, rocks, debris, and clutter.  Check that handrails are not broken and are securely fastened. Both sides of steps should have handrails.  Have leaves, snow, and ice cleared regularly.  Use sand or salt on walkways during winter months.  In the garage, clean up grease or oil spills. BATHROOM  Install night lights.  Install grab bars by the toilet and in the tub  and shower.  Use non-skid mats or decals in the tub or shower.  Place a plastic non-slip stool in the shower to sit on, if needed.  Keep floors dry and clean up all water on the floor immediately.  Remove soap buildup in the tub or shower on a regular basis.  Secure bath mats with non-slip, double-sided rug tape.  Remove throw rugs and tripping hazards from the floors. BEDROOMS  Install night lights.  Make sure a bedside light is easy to reach.  Do not use oversized bedding.  Keep a telephone by your bedside.  Have a firm chair with side arms to use for getting dressed.  Remove throw rugs and tripping hazards from the floor. KITCHEN  Keep handles on pots and pans turned toward the center of the stove. Use back burners when possible.  Clean up spills quickly and allow time for drying.  Avoid walking on wet floors.  Avoid hot utensils and knives.  Position shelves so they are not too high or low.  Place commonly used objects within easy reach.  If necessary, use a sturdy step stool with a grab bar when reaching.  Keep electrical cables out of the way.  Do not use floor polish or wax that makes floors slippery. If you must use wax, use non-skid floor wax.  Remove throw rugs and tripping hazards from the floor.  STAIRWAYS  Never leave objects on stairs.  Place handrails on both sides of stairways and use them. Fix any loose handrails. Make sure handrails on both sides of the stairways are as long as the stairs.  Check carpeting to make sure it is firmly attached along stairs. Make repairs to worn or loose carpet promptly.  Avoid placing throw rugs at the top or bottom of stairways, or properly secure the rug with carpet tape to prevent slippage. Get rid of throw rugs, if possible.  Have an electrician put in a light switch at the top and bottom of the stairs. OTHER FALL PREVENTION TIPS  Wear low-heel or rubber-soled shoes that are supportive and fit well. Wear  closed toe shoes.  When using a stepladder, make sure it is fully opened and both spreaders are firmly locked. Do not climb a closed stepladder.  Add color or contrast paint or tape to grab bars and handrails in your home. Place contrasting color strips on first and last steps.  Learn and use mobility aids as needed. Install an electrical emergency response system.  Turn on lights to avoid dark areas. Replace light bulbs that burn out immediately. Get light switches that glow.  Arrange furniture to create clear pathways. Keep furniture in the same place.  Firmly attach carpet with non-skid or double-sided tape.  Eliminate uneven floor surfaces.  Select a carpet pattern that does not visually hide the edge of steps.  Be aware of all pets. OTHER HOME SAFETY TIPS  Set the water temperature for 120 F (48.8 C).  Keep emergency numbers on or near the telephone.  Keep smoke detectors on every level of the home and near sleeping areas. Document Released: 03/09/2002 Document Revised: 09/18/2011 Document Reviewed: 06/08/2011 [facility] Patient Information 2014 [place], [place].  Follow-Up Instructions: Return in about 3 months (around 07/21/2019).   Orders:  Orders Placed This Encounter  Procedures   US THYROID   Ambulatory Referral for DME   Ambulatory referral to Pain Clinic   Meds ordered this encounter  Medications   HYDROcodone-acetaminophen (NORCO/VICODIN) 5-325 MG tablet    Sig: Take 1 tablet by mouth every 6 (six) hours as needed for moderate pain.    Dispense:  30 tablet    Refill:  0   tiZANidine (ZANAFLEX) 4 MG tablet    Sig: Take 1 tablet (4 mg total) by mouth every 6 (six) hours as needed for muscle spasms.    Dispense:  90 tablet    Refill:  1      Procedures: No procedures performed   Clinical Data: Findings:  Study Result  CLINICAL DATA:  Worsening neck pain radiating into the shoulders over the last 2 years.  EXAM: MRI CERVICAL SPINE WITHOUT  CONTRAST  TECHNIQUE: Multiplanar, multisequence MR imaging of the cervical spine was performed. No intravenous contrast was administered.  COMPARISON:  MRI cervical spine from 09/13/2018  FINDINGS: Despite efforts by the technologist and patient, motion artifact is present on today's exam and could not be eliminated. This reduces exam sensitivity and specificity.  Alignment: Mild reversal of the normal cervical lordosis, without subluxation.  Vertebrae: No significant vertebral marrow edema is identified.  Cord: No significant abnormal spinal cord signal is observed.  Posterior Fossa, vertebral arteries, paraspinal tissues: Borderline low-lying tonsils, about 1 mm below the foramen magnum, but without Chiari malformation. There is a suggestion of prominence of the thyroid gland, correlate with physical exam in determining whether further workup is warranted.  Disc levels:  C2-3:  Unremarkable.  C3-4: Unremarkable.  C4-5: Borderline central narrowing of the thecal sac due to a small central disc protrusion. Borderline left foraminal narrowing due to facet spurring. This is similar to the prior exam.  C5-6: Unremarkable.  C6-7: Unremarkable.  C7-T1: Unremarkable  IMPRESSION: 1. Borderline central narrowing of the thecal sac and borderline left foraminal stenosis at C4-5, roughly similar to the prior exam. 2. Possible thyromegaly.   Electronically Signed   By: Gaylyn Rong M.D.   On: 04/16/2019 17:13      Subjective: Chief Complaint  Patient presents with   Neck - Pain, Follow-up    34 year old right handed female with a one month history and 2 day history of right upper extremity numbness and weakness and she reports she is dropping items.  She has difficulty using her hands to pull up her pants and also with buttoning clothes. She has bowel movements that are constipated, she is not taking pain meds, she is requesting a refill on  her meds. She is not having dfficulty passing her urine. She complaints of difficulty standing for more than 5 minutes, She is working [employer], and  Delivering meals, reports thant she has hurts in the lower back with her work. Walking or bending or lifting and moving groceries into the house are painful. She reports the pain is a 7-8 of 10. Lying down is fine, sitting up or standing or walking is painful and the left leg will go numb with standing, on the outside of the leg all the way down into the whole left foot, she reports shaking the foot to wake it up. Under went recent right UE cubital tunnel release and CTR by Dr. Mina Marble. Previous left UE procedures done Nearly 2-3 years ago.   Review of Systems  Constitutional: Positive for unexpected weight change (lost 10lbs on purpose). Negative for activity change, appetite change, chills, diaphoresis, fatigue and fever.  HENT: Negative.   Eyes: Negative.  Negative for visual disturbance.  Respiratory: Negative.   Cardiovascular: Negative.   Gastrointestinal: Negative.   Endocrine: Negative.   Genitourinary: Negative.  Negative for difficulty urinating, dyspareunia, dysuria, enuresis, flank pain, genital sores and urgency.  Musculoskeletal: Positive for back pain, gait problem, neck pain and neck stiffness. Negative for arthralgias, joint swelling and myalgias.  Skin: Negative.   Allergic/Immunologic: Negative.  Negative for environmental allergies, food allergies and immunocompromised state.  Neurological: Positive for weakness and numbness. Negative for dizziness, tremors, seizures, syncope, facial asymmetry, speech difficulty, light-headedness and headaches.  Hematological: Negative.   Psychiatric/Behavioral: Negative for agitation, behavioral problems, confusion, decreased concentration, dysphoric mood, hallucinations, self-injury, sleep disturbance and suicidal ideas. The patient is not nervous/anxious and is not hyperactive.       Objective: Vital Signs: BP (!) 145/85 (BP Location: Left Arm, Patient Position: Sitting) Comment: Simultaneous filing. User may not have seen previous data.   Pulse 95    Ht 5' (1.524 m) Comment: Simultaneous filing. User may not have seen previous data.   Wt 239 lb (108.4 kg)    LMP 07/25/2012    BMI 46.68 kg/m   Physical Exam Constitutional:      General: She is not in acute distress.    Appearance: She is not ill-appearing, toxic-appearing or diaphoretic.  HENT:     Head: Normocephalic.     Nose: Nose normal.     Mouth/Throat:     Mouth: Mucous membranes are moist.     Pharynx: Oropharynx is clear. No oropharyngeal exudate.  Eyes:     Pupils: Pupils are equal, round, and reactive to light.  Pulmonary:     Effort: Pulmonary effort is normal.     Breath sounds: Normal breath sounds.  Abdominal:     Palpations: Abdomen is soft.  Musculoskeletal:        General: Tenderness present.     Lumbar back: Negative right straight leg raise test and negative left straight leg raise test.  Skin:    General: Skin is warm and dry.     Coloration: Skin is not jaundiced or pale.     Findings: No bruising, erythema or rash.  Neurological:     Mental Status: She is alert.     Sensory: Sensory deficit present.  Psychiatric:        Mood and Affect: Mood normal.        Behavior: Behavior normal.        Thought Content: Thought content normal.        Judgment: Judgment normal.     Back Exam   Tenderness  The patient is experiencing tenderness in the cervical and lumbar.  Range of Motion  Extension:  70 abnormal  Flexion:  60 abnormal  Lateral bend right:  70 abnormal  Lateral bend left:  70 abnormal  Rotation right:  70 abnormal  Rotation left:  70 abnormal   Muscle Strength  Right Quadriceps:  5/5  Left Quadriceps:  5/5  Right Hamstrings:  5/5  Left Hamstrings:  5/5   Tests  Straight leg raise right: negative Straight leg raise left: negative  Other  Erythema: no  back redness Scars: absent      Specialty Comments:  No specialty comments available.  Imaging: No results found.   PMFS History: Patient Active Problem List   Diagnosis Date Noted   HNP (herniated nucleus pulposus), lumbar 11/05/2014    Priority: High    Class: Chronic   S/P lumbar spinal fusion 02/13/2017   Herniated nucleus pulposus, lumbar 04/10/2016   S/P endometrial ablation - HTA 2012 06/03/2012   Sterilization - h/o BTL 06/03/2012   OBESITY 09/03/2007   MIGRAINE HEADACHE 09/03/2007   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 - tx w/otc med   Asthma    albuterol inhaler   Blood transfusion    2008 with c/s 4 units transfused   Carpal tunnel syndrome on right    Chronic back pain    Depression    Dyspnea    exertion   Headache(784.0)    otc ibuprofen 800mg    Heart murmur    as a child   Obesity    Pneumonia    as a child    Family History  Problem Relation Age of Onset   Diabetes Mother    Diabetes Maternal Grandmother    Stroke Father     Past Surgical History:  Procedure Laterality Date   ABDOMINAL HYSTERECTOMY N/A 09/29/2012   Procedure: HYSTERECTOMY ABDOMINAL;  Surgeon: Purcell NailsAngela Y Roberts, MD;  Location: [facility];  Service: Gynecology;  Laterality: N/A;   APPENDECTOMY     BACK SURGERY     BILATERAL SALPINGECTOMY Bilateral 09/29/2012   Procedure: BILATERAL SALPINGECTOMY;  Surgeon: Purcell NailsAngela Y Roberts, MD;  Location: [facility];  Service: Gynecology;  Laterality: Bilateral;   CESAREAN SECTION     CHOLECYSTECTOMY     CYSTOSCOPY Bilateral 09/29/2012   Procedure:  CYSTOSCOPY;  Surgeon: Purcell Nails, MD;  Location: [facility];  Service: Gynecology;  Laterality: Bilateral;   ENDOMETRIAL ABLATION     EXAMINATION UNDER ANESTHESIA N/A 10/16/2012   Procedure: EXAM UNDER ANESTHESIA;  Surgeon: Purcell Nails, MD;  Location: [facility];  Service: Gynecology;   Laterality: N/A;  Suturing Vaginal Cuff   HAND SURGERY Right    carpal tunnel   LAPAROSCOPIC ASSISTED VAGINAL HYSTERECTOMY N/A 09/29/2012   Procedure: LAPAROSCOPIC ASSISTED VAGINAL HYSTERECTOMY;  Surgeon: Purcell Nails, MD;  Location: [facility];  Service: Gynecology;  Laterality: N/A;   LAPAROSCOPIC HYSTERECTOMY Bilateral 09/29/2012   Procedure: HYSTERECTOMY TOTAL LAPAROSCOPIC;  Surgeon: Purcell Nails, MD;  Location: [facility];  Service: Gynecology;  Laterality: Bilateral;  Total Laparoscopic Hysterectomy, Bilateral Salpingectomies, Cystoscopy poss. LAVH poss. TAH   LAPAROSCOPY     x3   LUMBAR LAMINECTOMY N/A 11/05/2014   Procedure: RIGHT L4-5 MICRODISCECTOMY ;  Surgeon: Kerrin Champagne, MD;  Location: [facility];  Service: Orthopedics;  Laterality: N/A;   LUMBAR LAMINECTOMY/DECOMPRESSION MICRODISCECTOMY N/A 04/10/2016   Procedure: Right L4-5 Microdiscectomy;  Surgeon: Kerrin Champagne, MD;  Location: [facility];  Service: Orthopedics;  Laterality: N/A;   TONSILLECTOMY     T & A   TRANSFORAMINAL LUMBAR INTERBODY FUSION (TLIF) WITH PEDICLE SCREW FIXATION 1 LEVEL N/A 02/13/2017   Procedure: Transforaminal Lumbar Interbody Fusion - Lumbar four-five;  Surgeon: Tia Alert, MD;  Location[facility];  Service: Neurosurgery;  Laterality: N/A;   TUBAL LIGATION     Social History   Occupational History   Not on file  Tobacco Use   Smoking status: Former Smoker    Packs/day: 0.00    Types: Cigarettes    Quit date: 10/17/2015    Years since quitting: 3.5   Smokeless tobacco: Never Used   Tobacco comment: 1-2 cigarettes a day  Substance and Sexual Activity   Alcohol use: Yes    Alcohol/week: 1.0 standard drinks    Types: 1 Glasses of wine per week    Comment: ocassionally   Drug use: No   Sexual activity: Yes    Birth control/protection: Surgical, Condom    Comment: surgical tubal

## 2019-04-28 ENCOUNTER — Ambulatory Visit
Admission: RE | Admit: 2019-04-28 | Discharge: 2019-04-28 | Disposition: A | Discharge status: Home / Self Care | Payer: Medicaid Other | Source: Ambulatory Visit | Attending: Specialist | Admitting: Specialist

## 2019-04-28 DIAGNOSIS — E010 Iodine-deficiency related diffuse (endemic) goiter: Secondary | ICD-10-CM

## 2019-04-29 ENCOUNTER — Telehealth: Payer: Self-pay | Admitting: Specialist

## 2019-04-29 NOTE — Telephone Encounter (Signed)
Patient called. Would like to know if she could get a temporary handicap sign. Her call back number is [phone]

## 2019-04-29 NOTE — Progress Notes (Signed)
Enlargement of the thyroid but no tumor or mass noted. She should follow up with her primary care for assessment of thyroid function. No follow up study is necessary according to radiology criteria.

## 2019-04-29 NOTE — Progress Notes (Signed)
I called and advised. 

## 2019-04-30 NOTE — Telephone Encounter (Signed)
Pending Dr. Barbaraann Faster signature

## 2019-05-01 NOTE — Telephone Encounter (Signed)
I called and advised her form was signed and placed at front desk for her to pick up.

## 2019-05-13 ENCOUNTER — Other Ambulatory Visit: Payer: Self-pay | Admitting: Specialist

## 2019-05-13 NOTE — Telephone Encounter (Signed)
I called and advised patient that I sent a refill request to Dr. Otelia Sergeant for her pain meds and that we have sent the infoto pain management and we are waiting on them to review her notes and that it can take 6-8 weeks to get into pain management.  She states that she understands and she will check with her pharmacy about the refill later this evening.

## 2019-05-13 NOTE — Telephone Encounter (Signed)
Patient called and stated that Pain Mgmt has not called her yet and requesting a refill of Hydrocodone.  Please call patient -[phone]

## 2019-05-14 MED ORDER — HYDROCODONE-ACETAMINOPHEN 5-325 MG PO TABS: 1.0000 | ORAL_TABLET | Freq: Four times a day (QID) | ORAL | 0 refills | Status: DC | PRN

## 2019-06-25 ENCOUNTER — Telehealth: Payer: Self-pay | Admitting: Specialist

## 2019-06-25 NOTE — Telephone Encounter (Signed)
Received call from Elease Hashimoto w/ [facility] checking status of request for records. I advised we haven't received. I gave her both ou fax lines to re fax request.

## 2019-06-30 ENCOUNTER — Telehealth: Payer: Self-pay | Admitting: Specialist

## 2019-06-30 NOTE — Telephone Encounter (Signed)
Pending approval from Dr Otelia Sergeant

## 2019-06-30 NOTE — Telephone Encounter (Signed)
Patient called needing another handicap placard. Patient said her placard will run out tomorrow. The number to contact patient is [phone]

## 2019-07-07 NOTE — Telephone Encounter (Signed)
Do you know if he signed this last week?

## 2019-07-07 NOTE — Telephone Encounter (Signed)
He did not hand me anything while I was in office. Dr. Otelia Sergeant was signing several papers that he was going to place on your desk Thursday evening after clinic.

## 2019-07-10 NOTE — Telephone Encounter (Signed)
Placard signed and I will place it down stairs for her to pick up after 1pm

## 2019-08-30 ENCOUNTER — Ambulatory Visit (HOSPITAL_COMMUNITY)
Admission: EM | Admit: 2019-08-30 | Discharge: 2019-08-30 | Disposition: A | Discharge status: Home / Self Care | Payer: Medicaid Other | Attending: Family Medicine | Admitting: Family Medicine

## 2019-08-30 ENCOUNTER — Encounter (HOSPITAL_COMMUNITY): Payer: Self-pay | Admitting: Emergency Medicine

## 2019-08-30 ENCOUNTER — Other Ambulatory Visit: Payer: Self-pay

## 2019-08-30 DIAGNOSIS — N898 Other specified noninflammatory disorders of vagina: Secondary | ICD-10-CM | POA: Insufficient documentation

## 2019-08-30 DIAGNOSIS — Z113 Encounter for screening for infections with a predominantly sexual mode of transmission: Secondary | ICD-10-CM | POA: Diagnosis present

## 2019-08-30 LAB — HIV ANTIBODY (ROUTINE TESTING W REFLEX): HIV Screen 4th Generation wRfx: NONREACTIVE

## 2019-08-30 NOTE — ED Provider Notes (Signed)
[place]   [id number] 08/30/19 Arrival Time: 1206   CC: VAGINAL DISCHARGE  SUBJECTIVE:  Yvonne [name] is a 34 y.o. female who presents with complaints of gradual vaginal discharge that began 3 days ago. She denies a precipitating event, recent sexual encounter or recent antibiotic use. Patient is sexually active with males. Describes discharge as thick and yellow. She has not tried OTC meds. There are no aggravating symptoms. She reports similar symptoms in the past and was diagnosed with BV and treated with flagyl. She denies fever, chills, nausea, vomiting, abdominal or pelvic pain, urinary symptoms, vaginal itching, vaginal odor, vaginal bleeding, dyspareunia, vaginal rashes or lesions.   Patient's last menstrual period was 07/25/2012. Current birth control method: surgical  ROS: As per HPI.  All other pertinent ROS negative.     Past Medical History:  Diagnosis Date  . Anemia   . Anxiety   . 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 Asthma    albuterol inhaler  . Blood transfusion    2008 with c/s 4 units transfused  . Carpal tunnel syndrome on right   . Chronic back pain   . Depression   . Dyspnea    exertion  . Headache(784.0)    otc ibuprofen 800mg   . Heart murmur    as a child  . Obesity   . Pneumonia    as a child   Past Surgical History:  Procedure Laterality Date  . ABDOMINAL HYSTERECTOMY N/A 09/29/2012   Procedure: HYSTERECTOMY ABDOMINAL;  Surgeon: Delice Lesch, MD;  Location: [facility];  Service: Gynecology;  Laterality: N/A;  . APPENDECTOMY    . BACK SURGERY    . BILATERAL SALPINGECTOMY Bilateral 09/29/2012   Procedure: BILATERAL SALPINGECTOMY;  Surgeon: Delice Lesch, MD;  Location: [facility];  Service: Gynecology;  Laterality: Bilateral;  . CESAREAN SECTION    . CHOLECYSTECTOMY    . CYSTOSCOPY Bilateral 09/29/2012   Procedure: CYSTOSCOPY;  Surgeon: Delice Lesch, MD;  Location: [facility];  Service:  Gynecology;  Laterality: Bilateral;  . ENDOMETRIAL ABLATION    . EXAMINATION UNDER ANESTHESIA N/A 10/16/2012   Procedure: EXAM UNDER ANESTHESIA;  Surgeon: Delice Lesch, MD;  Location: [facility];  Service: Gynecology;  Laterality: N/A;  Suturing Vaginal Cuff  . HAND SURGERY Right    carpal tunnel  . LAPAROSCOPIC ASSISTED VAGINAL HYSTERECTOMY N/A 09/29/2012   Procedure: LAPAROSCOPIC ASSISTED VAGINAL HYSTERECTOMY;  Surgeon: Delice Lesch, MD;  Location: [facility];  Service: Gynecology;  Laterality: N/A;  . LAPAROSCOPIC HYSTERECTOMY Bilateral 09/29/2012   Procedure: HYSTERECTOMY TOTAL LAPAROSCOPIC;  Surgeon: Delice Lesch, MD;  Location: [facility];  Service: Gynecology;  Laterality: Bilateral;  Total Laparoscopic Hysterectomy, Bilateral Salpingectomies, Cystoscopy poss. LAVH poss. TAH  . LAPAROSCOPY     x3  . LUMBAR LAMINECTOMY N/A 11/05/2014   Procedure: RIGHT L4-5 MICRODISCECTOMY ;  Surgeon: Jessy Oto, MD;  Location: [facility];  Service: Orthopedics;  Laterality: N/A;  . LUMBAR LAMINECTOMY/DECOMPRESSION MICRODISCECTOMY N/A 04/10/2016   Procedure: Right L4-5 Microdiscectomy;  Surgeon: Jessy Oto, MD;  Location: [facility];  Service: Orthopedics;  Laterality: N/A;  . TONSILLECTOMY     T & A  . TRANSFORAMINAL LUMBAR INTERBODY FUSION (TLIF) WITH PEDICLE SCREW FIXATION 1 LEVEL N/A 02/13/2017   Procedure: Transforaminal Lumbar Interbody Fusion - Lumbar four-five;  Surgeon: Eustace Moore, MD;  Location: [facility];  Service: Neurosurgery;  Laterality: N/A;  .  TUBAL LIGATION     Allergies  Allergen Reactions  . Bee Venom Anaphylaxis, Swelling and Other (See Comments)    SYNCOPE SWELLING OF ENTIRE BODY AND THROAT   . Reglan [Metoclopramide Hcl] Anaphylaxis  . Aspirin Nausea And Vomiting   No current facility-administered medications on file prior to encounter.   Current Outpatient Medications on File Prior to Encounter  Medication Sig Dispense Refill  . ALPRAZolam (XANAX) 0.5 MG tablet Take one tablet po  at the MRI. 2 tablet 0  . fluconazole (DIFLUCAN) 150 MG tablet Take 1 tablet weekly. 2 tablet 0  . HYDROcodone-acetaminophen (NORCO/VICODIN) 5-325 MG tablet Take 1 tablet by mouth every 6 (six) hours as needed for moderate pain. 30 tablet 0  . ibuprofen (ADVIL) 800 MG tablet Take 1 tablet (800 mg total) by mouth every 8 (eight) hours as needed for moderate pain. 30 tablet 0  . metaxalone (SKELAXIN) 800 MG tablet Take 1 tablet (800 mg total) by mouth 3 (three) times daily. 40 tablet 0  . metroNIDAZOLE (FLAGYL) 500 MG tablet Take 1 tablet (500 mg total) by mouth 2 (two) times daily. 14 tablet 0  . nitrofurantoin, macrocrystal-monohydrate, (MACROBID) 100 MG capsule Take 1 capsule (100 mg total) by mouth 2 (two) times daily. 10 capsule 0  . predniSONE (DELTASONE) 20 MG tablet Day 1-5: Take 2 tablets daily with breakfast. Day 6-10: Take 1 tablet daily with breakfast. 10 tablet 0  . QUEtiapine (SEROQUEL) 300 MG tablet Take 300 mg by mouth at bedtime.    Marland Kitchen tiZANidine (ZANAFLEX) 4 MG tablet Take 1 tablet (4 mg total) by mouth every 6 (six) hours as needed for muscle spasms. 90 tablet 1    Social History   Socioeconomic History  . Marital status: Single    Spouse name: Not on file  . Number of children: Not on file  . Years of education: Not on file  . Highest education level: Not on file  Occupational History  . Not on file  Tobacco Use  . Smoking status: Former Smoker    Packs/day: 0.00    Types: Cigarettes    Quit date: 10/17/2015    Years since quitting: 3.8  . Smokeless tobacco: Never Used  . Tobacco comment: 1-2 cigarettes a day  Substance and Sexual Activity  . Alcohol use: Yes    Alcohol/week: 1.0 standard drinks    Types: 1 Glasses of wine per week    Comment: ocassionally  . Drug use: No  . Sexual activity: Yes    Birth control/protection: Surgical, Condom    Comment: surgical tubal  Other Topics Concern  . Not on file  Social History Narrative  . Not on file   Social  Determinants of Health   Financial Resource Strain:   . Difficulty of Paying Living Expenses:   Food Insecurity:   . Worried About Programme researcher, broadcasting/film/video in the Last Year:   . Barista in the Last Year:   Transportation Needs:   . Freight forwarder (Medical):   Marland Kitchen Lack of Transportation (Non-Medical):   Physical Activity:   . Days of Exercise per Week:   . Minutes of Exercise per Session:   Stress:   . Feeling of Stress :   Social Connections:   . Frequency of Communication with Friends and Family:   . Frequency of Social Gatherings with Friends and Family:   . Attends Religious Services:   . Active Member of Clubs or Organizations:   .  Attends Banker Meetings:   Marland Kitchen Marital Status:   Intimate Partner Violence:   . Fear of Current or Ex-Partner:   . Emotionally Abused:   Marland Kitchen Physically Abused:   . Sexually Abused:    Family History  Problem Relation Age of Onset  . Diabetes Mother   . Diabetes Maternal Grandmother   . Stroke Father     OBJECTIVE:  Vitals:   08/30/19 1254  BP: 115/87  Pulse: 87  Resp: 16  Temp: 98.3 F (36.8 C)  TempSrc: Oral  SpO2: 100%     General appearance: Alert, NAD, appears stated age Head: NCAT Throat: lips, mucosa, and tongue normal; teeth and gums normal Lungs: CTA bilaterally without adventitious breath sounds Heart: regular rate and rhythm.  Radial pulses 2+ symmetrical bilaterally Back: no CVA tenderness Abdomen: soft, non-tender; bowel sounds normal; no masses or organomegaly; no guarding or rebound tenderness GU: declines, vaginal self swab obtained Skin: warm and dry Psychological:  Alert and cooperative. Normal mood and affect.  LABS:  Results for orders placed or performed during the hospital encounter of 02/13/19  Novel Coronavirus, NAA (Hosp order, Send-out to Ref Lab; TAT 18-24 hrs   Specimen: Nasopharyngeal Swab; Respiratory  Result Value Ref Range   SARS-CoV-2, NAA NOT DETECTED NOT DETECTED    Coronavirus Source NASOPHARYNGEAL   Cervicovaginal ancillary only  Result Value Ref Range   Bacterial vaginitis **POSITIVE for Gardnerella vaginalis** (A)    Candida vaginitis Negative for Candida species    Trichomonas Negative    Chlamydia Negative    Neisseria Gonorrhea Negative     Labs Reviewed  RPR  HIV ANTIBODY (ROUTINE TESTING W REFLEX)  CERVICOVAGINAL ANCILLARY ONLY    ASSESSMENT & PLAN:  1. Vaginal discharge   2. Screen for STD (sexually transmitted disease)     No orders of the defined types were placed in this encounter.   Pending: Labs Reviewed  RPR  HIV ANTIBODY (ROUTINE TESTING W REFLEX)  CERVICOVAGINAL ANCILLARY ONLY    Vaginal self-swab obtained.   We will follow up with you regarding abnormal results HIV/ syphilis testing today  If tests results are positive, please abstain from sexual activity until you and your partner(s) have been treated Follow up with PCP or [facility] if symptoms persists Return here or go to ER if you have any new or worsening symptoms fever, chills, nausea, vomiting, abdominal or pelvic pain, painful intercourse, vaginal discharge, vaginal bleeding, persistent symptoms despite treatment  Reviewed expectations re: course of current medical issues. Questions answered. Outlined signs and symptoms indicating need for more acute intervention. Patient verbalized understanding. After Visit Summary given.         Moshe Cipro, NP 08/30/19 1343

## 2019-08-30 NOTE — ED Triage Notes (Signed)
Pt c/o vaginal discharge onset 3 days ago. Pt reports she gets BV very frequently. Denies itchiness or burning with urination. Would like STD check.

## 2019-08-30 NOTE — Discharge Instructions (Addendum)
Do not have sex for 7 days.  Your STD tests are pending.  If your test results are positive, we will call you.  You may need additional treatment and your partner(s) may also need treatment.

## 2019-08-31 LAB — RPR: RPR Ser Ql: NONREACTIVE

## 2019-09-02 LAB — CERVICOVAGINAL ANCILLARY ONLY
Bacterial Vaginitis (gardnerella): POSITIVE — AB
Candida Glabrata: NEGATIVE
Candida Vaginitis: POSITIVE — AB
Chlamydia: NEGATIVE
Comment: NEGATIVE
Comment: NEGATIVE
Comment: NEGATIVE
Comment: NEGATIVE
Comment: NEGATIVE
Comment: NORMAL
Neisseria Gonorrhea: NEGATIVE
Trichomonas: NEGATIVE

## 2019-09-03 ENCOUNTER — Telehealth (HOSPITAL_COMMUNITY): Payer: Self-pay | Admitting: Orthopedic Surgery

## 2019-09-03 MED ORDER — METRONIDAZOLE 500 MG PO TABS: 500.0000 mg | ORAL_TABLET | Freq: Two times a day (BID) | ORAL | 0 refills | Status: DC

## 2019-09-03 MED ORDER — FLUCONAZOLE 150 MG PO TABS: 150.0000 mg | ORAL_TABLET | Freq: Every day | ORAL | 0 refills | Status: AC

## 2019-10-01 DIAGNOSIS — Z419 Encounter for procedure for purposes other than remedying health state, unspecified: Secondary | ICD-10-CM | POA: Diagnosis not present

## 2019-10-08 ENCOUNTER — Other Ambulatory Visit: Payer: Self-pay

## 2019-10-08 ENCOUNTER — Encounter (HOSPITAL_COMMUNITY): Payer: Self-pay

## 2019-10-08 ENCOUNTER — Ambulatory Visit (HOSPITAL_COMMUNITY)
Admission: EM | Admit: 2019-10-08 | Discharge: 2019-10-08 | Disposition: A | Discharge status: Home / Self Care | Payer: Medicaid Other

## 2019-10-08 DIAGNOSIS — N898 Other specified noninflammatory disorders of vagina: Secondary | ICD-10-CM

## 2019-10-08 MED ORDER — METRONIDAZOLE 500 MG PO TABS: 500.0000 mg | ORAL_TABLET | Freq: Two times a day (BID) | ORAL | 0 refills | Status: DC

## 2019-10-08 NOTE — ED Triage Notes (Signed)
Pt c/o vaginal odor and white vaginal dischargex3 days. Pt states she had STI testing last week and believes this is just BV.

## 2019-10-08 NOTE — Discharge Instructions (Addendum)
Treating you for BV.  Take medication as prescribed. Follow up as needed for continued or worsening symptoms

## 2019-10-08 NOTE — ED Provider Notes (Signed)
[facility]    CSN: [id number] Arrival date & time: 10/08/19  1015      History   Chief Complaint Chief Complaint  Patient presents with  . vaginal odor    HPI Yvonne [name] is a 34 y.o. female.   Patient is a 34 year old female presents today with vaginal odor, discharge x3 days.  Symptoms have been consistent.  Symptoms similar to bacterial vaginosis infection.  Recently tested for STIs and not concerned.  Denies any abdominal pain, back pain, fevers, dysuria, hematuria urinary frequency. Patient's last menstrual period was 07/25/2012.  ROS per HPI      Past Medical History:  Diagnosis Date  . Anemia   . Anxiety   . 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 Asthma    albuterol inhaler  . Blood transfusion    2008 with c/s 4 units transfused  . Carpal tunnel syndrome on right   . Chronic back pain   . Depression   . Dyspnea    exertion  . Headache(784.0)    otc ibuprofen 800mg   . Heart murmur    as a child  . Obesity   . Pneumonia    as a child    Patient Active Problem List   Diagnosis Date Noted  . S/P lumbar spinal fusion 02/13/2017  . Herniated nucleus pulposus, lumbar 04/10/2016  . HNP (herniated nucleus pulposus), lumbar 11/05/2014    Class: Chronic  . S/P endometrial ablation - HTA 2012 06/03/2012  . Sterilization - h/o BTL 06/03/2012  . OBESITY 09/03/2007  . MIGRAINE HEADACHE 09/03/2007    Past Surgical History:  Procedure Laterality Date  . ABDOMINAL HYSTERECTOMY N/A 09/29/2012   Procedure: HYSTERECTOMY ABDOMINAL;  Surgeon: 10/01/2012, MD;  Location: [facility];  Service: Gynecology;  Laterality: N/A;  . APPENDECTOMY    . BACK SURGERY    . BILATERAL SALPINGECTOMY Bilateral 09/29/2012   Procedure: BILATERAL SALPINGECTOMY;  Surgeon: 10/01/2012, MD;  Location: [facility];  Service: Gynecology;  Laterality: Bilateral;  . CESAREAN SECTION    . CHOLECYSTECTOMY    . CYSTOSCOPY Bilateral 09/29/2012    Procedure: CYSTOSCOPY;  Surgeon: 10/01/2012, MD;  Location: [facility];  Service: Gynecology;  Laterality: Bilateral;  . ENDOMETRIAL ABLATION    . EXAMINATION UNDER ANESTHESIA N/A 10/16/2012   Procedure: EXAM UNDER ANESTHESIA;  Surgeon: 10/18/2012, MD;  Location: [facility];  Service: Gynecology;  Laterality: N/A;  Suturing Vaginal Cuff  . HAND SURGERY Right    carpal tunnel  . LAPAROSCOPIC ASSISTED VAGINAL HYSTERECTOMY N/A 09/29/2012   Procedure: LAPAROSCOPIC ASSISTED VAGINAL HYSTERECTOMY;  Surgeon: 10/01/2012, MD;  Location: [facility];  Service: Gynecology;  Laterality: N/A;  . LAPAROSCOPIC HYSTERECTOMY Bilateral 09/29/2012   Procedure: HYSTERECTOMY TOTAL LAPAROSCOPIC;  Surgeon: 10/01/2012, MD;  Location: [facility];  Service: Gynecology;  Laterality: Bilateral;  Total Laparoscopic Hysterectomy, Bilateral Salpingectomies, Cystoscopy poss. LAVH poss. TAH  . LAPAROSCOPY     x3  . LUMBAR LAMINECTOMY N/A 11/05/2014   Procedure: RIGHT L4-5 MICRODISCECTOMY ;  Surgeon: 01/05/2015, MD;  Location: [facility];  Service: Orthopedics;  Laterality: N/A;  . LUMBAR LAMINECTOMY/DECOMPRESSION MICRODISCECTOMY N/A 04/10/2016   Procedure: Right L4-5 Microdiscectomy;  Surgeon: 06/08/2016, MD;  Location: [facility];  Service: Orthopedics;  Laterality: N/A;  . THYROIDECTOMY    . TONSILLECTOMY     T & A  . TRANSFORAMINAL LUMBAR INTERBODY  FUSION (TLIF) WITH PEDICLE SCREW FIXATION 1 LEVEL N/A 02/13/2017   Procedure: Transforaminal Lumbar Interbody Fusion - Lumbar four-five;  Surgeon: Tia Alert, MD;  Location: [facility];  Service: Neurosurgery;  Laterality: N/A;  . TUBAL LIGATION      OB History    Gravida  4   Para  4   Term  4   Preterm      AB      Living  1     SAB      TAB      Ectopic      Multiple      Live Births  1            Home Medications    Prior to Admission medications   Medication Sig Start Date End Date Taking? Authorizing Provider  HYDROcodone-acetaminophen  (NORCO/VICODIN) 5-325 MG tablet Take 1 tablet by mouth every 6 (six) hours as needed for moderate pain. 05/14/19   Kerrin Champagne, MD  levothyroxine (SYNTHROID) 150 MCG tablet Take 150 mcg by mouth daily. 08/15/19   [provider]  metroNIDAZOLE (FLAGYL) 500 MG tablet Take 1 tablet (500 mg total) by mouth 2 (two) times daily. 10/08/19   Dahlia Byes A, NP  QUEtiapine (SEROQUEL) 300 MG tablet Take 300 mg by mouth at bedtime.    [provider]  tiZANidine (ZANAFLEX) 4 MG tablet Take 1 tablet (4 mg total) by mouth every 6 (six) hours as needed for muscle spasms. 04/22/19   Kerrin Champagne, MD    Family History Family History  Problem Relation Age of Onset  . Diabetes Mother   . Diabetes Maternal Grandmother   . Stroke Father     Social History Social History   Tobacco Use  . Smoking status: Current Every Day Smoker    Packs/day: 1.00    Types: Cigarettes    Last attempt to quit: 10/17/2015    Years since quitting: 3.9  . Smokeless tobacco: Never Used  Vaping Use  . Vaping Use: Never used  Substance Use Topics  . Alcohol use: Yes    Alcohol/week: 1.0 standard drink    Types: 1 Glasses of wine per week    Comment: ocassionally  . Drug use: No     Allergies   Bee venom, Reglan [metoclopramide hcl], and Aspirin   Review of Systems Review of Systems   Physical Exam Triage Vital Signs ED Triage Vitals [10/08/19 1050]  Enc Vitals Group     BP 112/67     Pulse Rate 86     Resp 16     Temp 98.1 F (36.7 C)     Temp Source Oral     SpO2 100 %     Weight 250 lb (113.4 kg)     Height 5' (1.524 m)     Head Circumference      Peak Flow      Pain Score 0     Pain Loc      Pain Edu?      Excl. in GC?    No data found.  Updated Vital Signs BP 112/67   Pulse 86   Temp 98.1 F (36.7 C) (Oral)   Resp 16   Ht 5' (1.524 m)   Wt 250 lb (113.4 kg)   LMP 07/25/2012   SpO2 100%   BMI 48.82 kg/m   Visual Acuity Right Eye Distance:   Left Eye Distance:     Bilateral Distance:  Right Eye Near:   Left Eye Near:    Bilateral Near:     Physical Exam Vitals and nursing note reviewed.  Constitutional:      General: She is not in acute distress.    Appearance: Normal appearance. She is not ill-appearing, toxic-appearing or diaphoretic.  HENT:     Head: Normocephalic.     Nose: Nose normal.  Eyes:     Conjunctiva/sclera: Conjunctivae normal.  Pulmonary:     Effort: Pulmonary effort is normal.  Musculoskeletal:        General: Normal range of motion.     Cervical back: Normal range of motion.  Skin:    General: Skin is warm and dry.     Findings: No rash.  Neurological:     Mental Status: She is alert.  Psychiatric:        Mood and Affect: Mood normal.      UC Treatments / Results  Labs (all labs ordered are listed, but only abnormal results are displayed) Labs Reviewed - No data to display  EKG   Radiology No results found.  Procedures Procedures (including critical care time)  Medications Ordered in UC Medications - No data to display  Initial Impression / Assessment and Plan / UC Course  I have reviewed the triage vital signs and the nursing notes.  Pertinent labs & imaging results that were available during my care of the patient were reviewed by me and considered in my medical decision making (see chart for details).     Vaginal discharge Treating for BV based on symptoms and history Follow up as needed for continued or worsening symptoms  Final Clinical Impressions(s) / UC Diagnoses   Final diagnoses:  Vaginal discharge     Discharge Instructions     Treating you for BV.  Take medication as prescribed. Follow up as needed for continued or worsening symptoms     ED Prescriptions    Medication Sig Dispense Auth. Provider   metroNIDAZOLE (FLAGYL) 500 MG tablet Take 1 tablet (500 mg total) by mouth 2 (two) times daily. 14 tablet Stefano Trulson A, NP     PDMP not reviewed this encounter.    Janace Aris, NP 10/08/19 1129

## 2019-10-13 DIAGNOSIS — F411 Generalized anxiety disorder: Secondary | ICD-10-CM | POA: Diagnosis not present

## 2019-10-13 DIAGNOSIS — F330 Major depressive disorder, recurrent, mild: Secondary | ICD-10-CM | POA: Diagnosis not present

## 2019-10-21 DIAGNOSIS — R82998 Other abnormal findings in urine: Secondary | ICD-10-CM | POA: Diagnosis not present

## 2019-10-21 DIAGNOSIS — M4327 Fusion of spine, lumbosacral region: Secondary | ICD-10-CM | POA: Diagnosis not present

## 2019-10-21 DIAGNOSIS — Z79899 Other long term (current) drug therapy: Secondary | ICD-10-CM | POA: Diagnosis not present

## 2019-10-21 DIAGNOSIS — M62830 Muscle spasm of back: Secondary | ICD-10-CM | POA: Diagnosis not present

## 2019-10-21 DIAGNOSIS — N898 Other specified noninflammatory disorders of vagina: Secondary | ICD-10-CM | POA: Diagnosis not present

## 2019-10-28 ENCOUNTER — Other Ambulatory Visit: Payer: Self-pay | Admitting: Specialist

## 2019-11-01 DIAGNOSIS — Z419 Encounter for procedure for purposes other than remedying health state, unspecified: Secondary | ICD-10-CM | POA: Diagnosis not present

## 2019-11-17 ENCOUNTER — Ambulatory Visit (HOSPITAL_COMMUNITY)
Admission: EM | Admit: 2019-11-17 | Discharge: 2019-11-17 | Disposition: A | Discharge status: Home / Self Care | Payer: Medicaid Other | Attending: Family Medicine | Admitting: Family Medicine

## 2019-11-17 ENCOUNTER — Other Ambulatory Visit: Payer: Self-pay

## 2019-11-17 ENCOUNTER — Encounter (HOSPITAL_COMMUNITY): Payer: Self-pay | Admitting: Emergency Medicine

## 2019-11-17 DIAGNOSIS — L304 Erythema intertrigo: Secondary | ICD-10-CM

## 2019-11-17 DIAGNOSIS — K529 Noninfective gastroenteritis and colitis, unspecified: Secondary | ICD-10-CM

## 2019-11-17 MED ORDER — NYSTATIN 100000 UNIT/GM EX CREA: TOPICAL_CREAM | CUTANEOUS | 0 refills | Status: DC

## 2019-11-17 MED ORDER — ONDANSETRON HCL 4 MG PO TABS: 4.0000 mg | ORAL_TABLET | Freq: Four times a day (QID) | ORAL | 0 refills | Status: DC

## 2019-11-17 NOTE — ED Provider Notes (Signed)
[facility]    CSN: [id number] Arrival date & time: 11/17/19  1056      History   Chief Complaint Chief Complaint  Patient presents with  . Breast Problem    HPI Yvonne [name] is a 34 y.o. female.   Patient has sore under left breast.  Has a history of rash in the area that comes and goes.  Complains of increased moisture due to sweating there.  Also complains of some nausea vomiting and diarrhea.  She has had her appendix as well as gallbladder removed in the past.  HPI  Past Medical History:  Diagnosis Date  . Anemia   . Anxiety   . 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 Asthma    albuterol inhaler  . Blood transfusion    2008 with c/s 4 units transfused  . Carpal tunnel syndrome on right   . Chronic back pain   . Depression   . Dyspnea    exertion  . Headache(784.0)    otc ibuprofen 800mg   . Heart murmur    as a child  . Obesity   . Pneumonia    as a child    Patient Active Problem List   Diagnosis Date Noted  . S/P lumbar spinal fusion 02/13/2017  . Herniated nucleus pulposus, lumbar 04/10/2016  . HNP (herniated nucleus pulposus), lumbar 11/05/2014    Class: Chronic  . S/P endometrial ablation - HTA 2012 06/03/2012  . Sterilization - h/o BTL 06/03/2012  . OBESITY 09/03/2007  . MIGRAINE HEADACHE 09/03/2007    Past Surgical History:  Procedure Laterality Date  . ABDOMINAL HYSTERECTOMY N/A 09/29/2012   Procedure: HYSTERECTOMY ABDOMINAL;  Surgeon: 10/01/2012, MD;  Location: [facility];  Service: Gynecology;  Laterality: N/A;  . APPENDECTOMY    . BACK SURGERY    . BILATERAL SALPINGECTOMY Bilateral 09/29/2012   Procedure: BILATERAL SALPINGECTOMY;  Surgeon: 10/01/2012, MD;  Location: [facility];  Service: Gynecology;  Laterality: Bilateral;  . CESAREAN SECTION    . CHOLECYSTECTOMY    . CYSTOSCOPY Bilateral 09/29/2012   Procedure: CYSTOSCOPY;  Surgeon: 10/01/2012, MD;  Location: [facility];  Service:  Gynecology;  Laterality: Bilateral;  . ENDOMETRIAL ABLATION    . EXAMINATION UNDER ANESTHESIA N/A 10/16/2012   Procedure: EXAM UNDER ANESTHESIA;  Surgeon: 10/18/2012, MD;  Location: [facility];  Service: Gynecology;  Laterality: N/A;  Suturing Vaginal Cuff  . HAND SURGERY Right    carpal tunnel  . LAPAROSCOPIC ASSISTED VAGINAL HYSTERECTOMY N/A 09/29/2012   Procedure: LAPAROSCOPIC ASSISTED VAGINAL HYSTERECTOMY;  Surgeon: 10/01/2012, MD;  Location: [facility];  Service: Gynecology;  Laterality: N/A;  . LAPAROSCOPIC HYSTERECTOMY Bilateral 09/29/2012   Procedure: HYSTERECTOMY TOTAL LAPAROSCOPIC;  Surgeon: 10/01/2012, MD;  Location: [facility];  Service: Gynecology;  Laterality: Bilateral;  Total Laparoscopic Hysterectomy, Bilateral Salpingectomies, Cystoscopy poss. LAVH poss. TAH  . LAPAROSCOPY     x3  . LUMBAR LAMINECTOMY N/A 11/05/2014   Procedure: RIGHT L4-5 MICRODISCECTOMY ;  Surgeon: 01/05/2015, MD;  Location: [facility];  Service: Orthopedics;  Laterality: N/A;  . LUMBAR LAMINECTOMY/DECOMPRESSION MICRODISCECTOMY N/A 04/10/2016   Procedure: Right L4-5 Microdiscectomy;  Surgeon: 06/08/2016, MD;  Location: [facility];  Service: Orthopedics;  Laterality: N/A;  . THYROIDECTOMY    . TONSILLECTOMY     T & A  . TRANSFORAMINAL LUMBAR INTERBODY FUSION (TLIF) WITH PEDICLE SCREW FIXATION  1 LEVEL N/A 02/13/2017   Procedure: Transforaminal Lumbar Interbody Fusion - Lumbar four-five;  Surgeon: Tia Alert, MD;  Location: [facility];  Service: Neurosurgery;  Laterality: N/A;  . TUBAL LIGATION      OB History    Gravida  4   Para  4   Term  4   Preterm      AB      Living  1     SAB      TAB      Ectopic      Multiple      Live Births  1            Home Medications    Prior to Admission medications   Medication Sig Start Date End Date Taking? Authorizing Provider  HYDROcodone-acetaminophen (NORCO/VICODIN) 5-325 MG tablet Take 1 tablet by mouth every 6 (six) hours as needed for  moderate pain. 05/14/19  Yes Kerrin Champagne, MD  levothyroxine (SYNTHROID) 150 MCG tablet Take 150 mcg by mouth daily. 08/15/19  Yes [provider]  QUEtiapine (SEROQUEL) 300 MG tablet Take 300 mg by mouth at bedtime.   Yes [provider]  ibuprofen (ADVIL) 800 MG tablet TAKE 1 TABLET(800 MG) BY MOUTH EVERY 8 HOURS AS NEEDED FOR MODERATE PAIN 10/30/19   Kerrin Champagne, MD  metroNIDAZOLE (FLAGYL) 500 MG tablet Take 1 tablet (500 mg total) by mouth 2 (two) times daily. 10/08/19   Dahlia Byes A, NP  tiZANidine (ZANAFLEX) 4 MG tablet Take 1 tablet (4 mg total) by mouth every 6 (six) hours as needed for muscle spasms. 04/22/19   Kerrin Champagne, MD    Family History Family History  Problem Relation Age of Onset  . Diabetes Mother   . Diabetes Maternal Grandmother   . Stroke Father     Social History Social History   Tobacco Use  . Smoking status: Current Every Day Smoker    Packs/day: 1.00    Types: Cigarettes    Last attempt to quit: 10/17/2015    Years since quitting: 4.0  . Smokeless tobacco: Never Used  Vaping Use  . Vaping Use: Never used  Substance Use Topics  . Alcohol use: Yes    Alcohol/week: 1.0 standard drink    Types: 1 Glasses of wine per week    Comment: ocassionally  . Drug use: No     Allergies   Bee venom, Reglan [metoclopramide hcl], and Aspirin   Review of Systems Review of Systems  Constitutional: Negative.   Gastrointestinal: Positive for diarrhea, nausea and vomiting.  All other systems reviewed and are negative.    Physical Exam Triage Vital Signs ED Triage Vitals  Enc Vitals Group     BP 11/17/19 1238 127/81     Pulse Rate 11/17/19 1238 76     Resp 11/17/19 1238 18     Temp 11/17/19 1238 98 F (36.7 C)     Temp Source 11/17/19 1238 Oral     SpO2 11/17/19 1238 100 %     Weight --      Height --      Head Circumference --      Peak Flow --      Pain Score 11/17/19 1236 9     Pain Loc --      Pain Edu? --      Excl. in GC?  --    No data found.  Updated Vital Signs BP 127/81 (BP Location: Right Arm)  Pulse 76   Temp 98 F (36.7 C) (Oral)   Resp 18   LMP 07/25/2012   SpO2 100%   Visual Acuity Right Eye Distance:   Left Eye Distance:   Bilateral Distance:    Right Eye Near:   Left Eye Near:    Bilateral Near:     Physical Exam Vitals and nursing note reviewed.  Constitutional:      Appearance: Normal appearance. She is obese.  HENT:     Head: Normocephalic.  Cardiovascular:     Rate and Rhythm: Normal rate and regular rhythm.  Pulmonary:     Effort: Pulmonary effort is normal.     Breath sounds: Normal breath sounds.  Abdominal:     General: Abdomen is flat. Bowel sounds are normal. There is no distension.     Tenderness: There is no abdominal tenderness. There is no guarding.  Skin:    Comments: Some breakdown of skin under left breast consistent with monilia  Neurological:     General: No focal deficit present.     Mental Status: She is alert and oriented to person, place, and time.      UC Treatments / Results  Labs (all labs ordered are listed, but only abnormal results are displayed) Labs Reviewed - No data to display  EKG   Radiology No results found.  Procedures Procedures (including critical care time)  Medications Ordered in UC Medications - No data to display  Initial Impression / Assessment and Plan / UC Course  I have reviewed the triage vital signs and the nursing notes.  Pertinent labs & imaging results that were available during my care of the patient were reviewed by me and considered in my medical decision making (see chart for details).     Sore under left breast consistent with monilia and skin breakdown Gastroenteritis Final Clinical Impressions(s) / UC Diagnoses   Final diagnoses:  None   Discharge Instructions   None    ED Prescriptions    None     PDMP not reviewed this encounter.   Frederica Kuster, MD 11/17/19 1302

## 2019-11-17 NOTE — Discharge Instructions (Signed)
After sore resolves use cornstarch or powder called Zeasorb to keep moisture down

## 2019-11-17 NOTE — ED Triage Notes (Signed)
Pt presents with sore under left breast xs 3 days. States can be warm to the touch at times. Denies any drainage.   C/o of diarrhea and abdominal cramping xs 2 days.

## 2019-11-20 DIAGNOSIS — G8929 Other chronic pain: Secondary | ICD-10-CM | POA: Diagnosis not present

## 2019-11-20 DIAGNOSIS — Z7189 Other specified counseling: Secondary | ICD-10-CM | POA: Diagnosis not present

## 2019-11-20 DIAGNOSIS — Z79899 Other long term (current) drug therapy: Secondary | ICD-10-CM | POA: Diagnosis not present

## 2019-11-20 DIAGNOSIS — R197 Diarrhea, unspecified: Secondary | ICD-10-CM | POA: Diagnosis not present

## 2019-11-20 DIAGNOSIS — Z20822 Contact with and (suspected) exposure to covid-19: Secondary | ICD-10-CM | POA: Diagnosis not present

## 2019-11-20 DIAGNOSIS — M545 Low back pain: Secondary | ICD-10-CM | POA: Diagnosis not present

## 2019-11-24 DIAGNOSIS — F330 Major depressive disorder, recurrent, mild: Secondary | ICD-10-CM | POA: Diagnosis not present

## 2019-11-24 DIAGNOSIS — F411 Generalized anxiety disorder: Secondary | ICD-10-CM | POA: Diagnosis not present

## 2019-11-24 DIAGNOSIS — G479 Sleep disorder, unspecified: Secondary | ICD-10-CM | POA: Diagnosis not present

## 2019-11-30 DIAGNOSIS — Z113 Encounter for screening for infections with a predominantly sexual mode of transmission: Secondary | ICD-10-CM | POA: Diagnosis not present

## 2019-11-30 DIAGNOSIS — B9689 Other specified bacterial agents as the cause of diseases classified elsewhere: Secondary | ICD-10-CM | POA: Diagnosis not present

## 2019-11-30 DIAGNOSIS — N760 Acute vaginitis: Secondary | ICD-10-CM | POA: Diagnosis not present

## 2019-12-02 DIAGNOSIS — Z419 Encounter for procedure for purposes other than remedying health state, unspecified: Secondary | ICD-10-CM | POA: Diagnosis not present

## 2019-12-14 ENCOUNTER — Ambulatory Visit (HOSPITAL_COMMUNITY)
Admission: RE | Admit: 2019-12-14 | Discharge: 2019-12-14 | Disposition: A | Discharge status: Home / Self Care | Payer: Medicaid Other | Source: Ambulatory Visit | Attending: Family Medicine | Admitting: Family Medicine

## 2019-12-14 ENCOUNTER — Other Ambulatory Visit: Payer: Self-pay

## 2019-12-14 ENCOUNTER — Encounter (HOSPITAL_COMMUNITY): Payer: Self-pay

## 2019-12-14 VITALS — BP 122/73 | HR 78 | Temp 98.4°F | Resp 17

## 2019-12-14 DIAGNOSIS — N898 Other specified noninflammatory disorders of vagina: Secondary | ICD-10-CM

## 2019-12-14 MED ORDER — ONDANSETRON 4 MG PO TBDP: 4.0000 mg | ORAL_TABLET | Freq: Three times a day (TID) | ORAL | 0 refills | Status: DC | PRN

## 2019-12-14 MED ORDER — CLINDAMYCIN HCL 300 MG PO CAPS: 300.0000 mg | ORAL_CAPSULE | Freq: Two times a day (BID) | ORAL | 0 refills | Status: DC

## 2019-12-14 NOTE — ED Provider Notes (Signed)
[facility]   [id number] 12/14/19 Arrival Time: 1127  ASSESSMENT & PLAN:  1. Vaginal discharge     Re-treat to ensure adequate tx.  Meds ordered this encounter  Medications  . clindamycin (CLEOCIN) 300 MG capsule    Sig: Take 1 capsule (300 mg total) by mouth 2 (two) times daily.    Dispense:  14 capsule    Refill:  0  . ondansetron (ZOFRAN-ODT) 4 MG disintegrating tablet    Sig: Take 1 tablet (4 mg total) by mouth every 8 (eight) hours as needed for nausea or vomiting.    Dispense:  15 tablet    Refill:  0    Without s/s of PID.    Reviewed expectations re: course of current medical issues. Questions answered. Outlined signs and symptoms indicating need for more acute intervention. Patient verbalized understanding. After Visit Summary given.   SUBJECTIVE:  Yvonne [name] is a 34 y.o. female who reports dx BV recently. Unable to finish Flagyl secondary to nausea. Still with same vaginal d/c. Felt was improving. Afebrile. No pelvic pain.   OBJECTIVE:  Vitals:   12/14/19 1148  BP: 122/73  Pulse: 78  Resp: 17  Temp: 98.4 F (36.9 C)  TempSrc: Oral  SpO2: 100%     General appearance: alert, cooperative, appears stated age and no distress Lungs: unlabored respirations; speaks full sentences without difficulty Back: no CVA tenderness; FROM at waist Abdomen: soft, non-tender GU: deferred Skin: warm and dry Psychological: alert and cooperative; normal mood and affect.    Labs Reviewed - No data to display  Allergies  Allergen Reactions  . Bee Venom Anaphylaxis, Swelling and Other (See Comments)    SYNCOPE SWELLING OF ENTIRE BODY AND THROAT   . Reglan [Metoclopramide Hcl] Anaphylaxis  . Aspirin Nausea And Vomiting    Past Medical History:  Diagnosis Date  . Anemia   . Anxiety   . 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 Asthma    albuterol inhaler  . Blood transfusion    2008 with c/s 4 units transfused    . Carpal tunnel syndrome on right   . Chronic back pain   . Depression   . Dyspnea    exertion  . Headache(784.0)    otc ibuprofen 800mg   . Heart murmur    as a child  . Obesity   . Pneumonia    as a child   Family History  Problem Relation Age of Onset  . Diabetes Mother   . Diabetes Maternal Grandmother   . Stroke Father    Social History   Socioeconomic History  . Marital status: Single    Spouse name: Not on file  . Number of children: Not on file  . Years of education: Not on file  . Highest education level: Not on file  Occupational History  . Not on file  Tobacco Use  . Smoking status: Current Every Day Smoker    Packs/day: 1.00    Types: Cigarettes    Last attempt to quit: 10/17/2015    Years since quitting: 4.1  . Smokeless tobacco: Never Used  Vaping Use  . Vaping Use: Never used  Substance and Sexual Activity  . Alcohol use: Yes    Alcohol/week: 1.0 standard drink    Types: 1 Glasses of wine per week    Comment: ocassionally  . Drug use: No  . Sexual activity: Yes  Birth control/protection: Surgical, Condom    Comment: surgical tubal  Other Topics Concern  . Not on file  Social History Narrative  . Not on file   Social Determinants of Health   Financial Resource Strain:   . Difficulty of Paying Living Expenses: Not on file  Food Insecurity:   . Worried About Programme researcher, broadcasting/film/video in the Last Year: Not on file  . Ran Out of Food in the Last Year: Not on file  Transportation Needs:   . Lack of Transportation (Medical): Not on file  . Lack of Transportation (Non-Medical): Not on file  Physical Activity:   . Days of Exercise per Week: Not on file  . Minutes of Exercise per Session: Not on file  Stress:   . Feeling of Stress : Not on file  Social Connections:   . Frequency of Communication with Friends and Family: Not on file  . Frequency of Social Gatherings with Friends and Family: Not on file  . Attends Religious Services: Not on file   . Active Member of Clubs or Organizations: Not on file  . Attends Banker Meetings: Not on file  . Marital Status: Not on file  Intimate Partner Violence:   . Fear of Current or Ex-Partner: Not on file  . Emotionally Abused: Not on file  . Physically Abused: Not on file  . Sexually Abused: Not on file          Mardella Layman, MD 12/15/19 [phone]

## 2019-12-14 NOTE — ED Triage Notes (Signed)
Pt was prescribed flagyl for BV but states it upsets her stomach; pt states she took all of the antibiotic but the last 3 doses.  Pt states she hasnt been taking it with food or had any zofran to go with it.

## 2019-12-21 DIAGNOSIS — Z79899 Other long term (current) drug therapy: Secondary | ICD-10-CM | POA: Diagnosis not present

## 2019-12-21 DIAGNOSIS — G894 Chronic pain syndrome: Secondary | ICD-10-CM | POA: Diagnosis not present

## 2019-12-21 DIAGNOSIS — M4327 Fusion of spine, lumbosacral region: Secondary | ICD-10-CM | POA: Diagnosis not present

## 2019-12-21 DIAGNOSIS — Z7189 Other specified counseling: Secondary | ICD-10-CM | POA: Diagnosis not present

## 2019-12-21 DIAGNOSIS — M62830 Muscle spasm of back: Secondary | ICD-10-CM | POA: Diagnosis not present

## 2019-12-21 DIAGNOSIS — F17210 Nicotine dependence, cigarettes, uncomplicated: Secondary | ICD-10-CM | POA: Diagnosis not present

## 2019-12-24 DIAGNOSIS — M654 Radial styloid tenosynovitis [de Quervain]: Secondary | ICD-10-CM | POA: Diagnosis not present

## 2019-12-24 DIAGNOSIS — M79641 Pain in right hand: Secondary | ICD-10-CM | POA: Diagnosis not present

## 2020-01-01 DIAGNOSIS — Z419 Encounter for procedure for purposes other than remedying health state, unspecified: Secondary | ICD-10-CM | POA: Diagnosis not present

## 2020-01-05 ENCOUNTER — Ambulatory Visit (HOSPITAL_COMMUNITY): Admit: 2020-01-05 | Discharge status: Against Medical Advice | Payer: Medicaid Other

## 2020-01-06 ENCOUNTER — Ambulatory Visit (HOSPITAL_COMMUNITY)
Admission: EM | Admit: 2020-01-06 | Discharge: 2020-01-06 | Disposition: A | Discharge status: Home / Self Care | Payer: Medicaid Other | Source: Home / Self Care

## 2020-01-06 ENCOUNTER — Ambulatory Visit (HOSPITAL_COMMUNITY)
Admission: EM | Admit: 2020-01-06 | Discharge: 2020-01-06 | Disposition: A | Discharge status: Home / Self Care | Payer: Medicaid Other | Attending: Emergency Medicine | Admitting: Emergency Medicine

## 2020-01-06 ENCOUNTER — Other Ambulatory Visit: Payer: Self-pay

## 2020-01-06 VITALS — BP 129/79 | HR 80 | Temp 98.2°F | Resp 15 | Ht 60.0 in | Wt 250.0 lb

## 2020-01-06 DIAGNOSIS — Z20822 Contact with and (suspected) exposure to covid-19: Secondary | ICD-10-CM | POA: Diagnosis not present

## 2020-01-06 DIAGNOSIS — J069 Acute upper respiratory infection, unspecified: Secondary | ICD-10-CM

## 2020-01-06 MED ORDER — DM-GUAIFENESIN ER 30-600 MG PO TB12: 1.0000 | ORAL_TABLET | Freq: Two times a day (BID) | ORAL | 0 refills | Status: DC

## 2020-01-06 MED ORDER — FLUTICASONE PROPIONATE 50 MCG/ACT NA SUSP: 1.0000 | Freq: Every day | NASAL | 0 refills | Status: DC

## 2020-01-06 MED ORDER — BENZONATATE 200 MG PO CAPS: 200.0000 mg | ORAL_CAPSULE | Freq: Three times a day (TID) | ORAL | 0 refills | Status: AC | PRN

## 2020-01-06 NOTE — ED Provider Notes (Signed)
[facility]    CSN: [id number] Arrival date & time: 01/06/20  1058      History   Chief Complaint Chief Complaint  Patient presents with  . Cough  . Nasal Congestion    HPI Yvonne [name] is a 34 y.o. female presenting today for evaluation of URI symptoms and cough.  Patient reports over the past 5 days she has had a lot of head congestion, sinus pressure, and cough.  Overall she feels the cough is improving, but today continues to have a lot of congestion.  She denies any fevers.  She has not tried any over-the-counter medicines for symptoms.  Denies GI symptoms.  Denies any known Covid exposures.  She has had some chest pressure.   HPI  Past Medical History:  Diagnosis Date  . Anemia   . Anxiety   . 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 Asthma    albuterol inhaler  . Blood transfusion    2008 with c/s 4 units transfused  . Carpal tunnel syndrome on right   . Chronic back pain   . Depression   . Dyspnea    exertion  . Headache(784.0)    otc ibuprofen 800mg   . Heart murmur    as a child  . Obesity   . Pneumonia    as a child    Patient Active Problem List   Diagnosis Date Noted  . S/P lumbar spinal fusion 02/13/2017  . Herniated nucleus pulposus, lumbar 04/10/2016  . HNP (herniated nucleus pulposus), lumbar 11/05/2014    Class: Chronic  . S/P endometrial ablation - HTA 2012 06/03/2012  . Sterilization - h/o BTL 06/03/2012  . OBESITY 09/03/2007  . MIGRAINE HEADACHE 09/03/2007    Past Surgical History:  Procedure Laterality Date  . ABDOMINAL HYSTERECTOMY N/A 09/29/2012   Procedure: HYSTERECTOMY ABDOMINAL;  Surgeon: 10/01/2012, MD;  Location: [facility];  Service: Gynecology;  Laterality: N/A;  . APPENDECTOMY    . BACK SURGERY    . BILATERAL SALPINGECTOMY Bilateral 09/29/2012   Procedure: BILATERAL SALPINGECTOMY;  Surgeon: 10/01/2012, MD;  Location: [facility];  Service: Gynecology;  Laterality: Bilateral;    . CESAREAN SECTION    . CHOLECYSTECTOMY    . CYSTOSCOPY Bilateral 09/29/2012   Procedure: CYSTOSCOPY;  Surgeon: 10/01/2012, MD;  Location: [facility];  Service: Gynecology;  Laterality: Bilateral;  . ENDOMETRIAL ABLATION    . EXAMINATION UNDER ANESTHESIA N/A 10/16/2012   Procedure: EXAM UNDER ANESTHESIA;  Surgeon: 10/18/2012, MD;  Location: [facility];  Service: Gynecology;  Laterality: N/A;  Suturing Vaginal Cuff  . HAND SURGERY Right    carpal tunnel  . LAPAROSCOPIC ASSISTED VAGINAL HYSTERECTOMY N/A 09/29/2012   Procedure: LAPAROSCOPIC ASSISTED VAGINAL HYSTERECTOMY;  Surgeon: 10/01/2012, MD;  Location: [facility];  Service: Gynecology;  Laterality: N/A;  . LAPAROSCOPIC HYSTERECTOMY Bilateral 09/29/2012   Procedure: HYSTERECTOMY TOTAL LAPAROSCOPIC;  Surgeon: 10/01/2012, MD;  Location: [facility];  Service: Gynecology;  Laterality: Bilateral;  Total Laparoscopic Hysterectomy, Bilateral Salpingectomies, Cystoscopy poss. LAVH poss. TAH  . LAPAROSCOPY     x3  . LUMBAR LAMINECTOMY N/A 11/05/2014   Procedure: RIGHT L4-5 MICRODISCECTOMY ;  Surgeon: 01/05/2015, MD;  Location: [facility];  Service: Orthopedics;  Laterality: N/A;  . LUMBAR LAMINECTOMY/DECOMPRESSION MICRODISCECTOMY N/A 04/10/2016   Procedure: Right L4-5 Microdiscectomy;  Surgeon: 06/08/2016, MD;  Location: [facility] OR;  Service:  Orthopedics;  Laterality: N/A;  . THYROIDECTOMY    . TONSILLECTOMY     T & A  . TRANSFORAMINAL LUMBAR INTERBODY FUSION (TLIF) WITH PEDICLE SCREW FIXATION 1 LEVEL N/A 02/13/2017   Procedure: Transforaminal Lumbar Interbody Fusion - Lumbar four-five;  Surgeon: Tia Alert, MD;  Location: [facility] OR;  Service: Neurosurgery;  Laterality: N/A;  . TUBAL LIGATION      OB History    Gravida  4   Para  4   Term  4   Preterm      AB      Living  1     SAB      TAB      Ectopic      Multiple      Live Births  1            Home Medications    Prior to Admission medications   Medication Sig  Start Date End Date Taking? Authorizing Provider  HYDROcodone-acetaminophen (NORCO/VICODIN) 5-325 MG tablet Take 1 tablet by mouth every 6 (six) hours as needed for moderate pain. 05/14/19  Yes Kerrin Champagne, MD  ibuprofen (ADVIL) 800 MG tablet TAKE 1 TABLET(800 MG) BY MOUTH EVERY 8 HOURS AS NEEDED FOR MODERATE PAIN 10/30/19  Yes Kerrin Champagne, MD  QUEtiapine (SEROQUEL) 300 MG tablet Take 300 mg by mouth at bedtime.   Yes [provider]  benzonatate (TESSALON) 200 MG capsule Take 1 capsule (200 mg total) by mouth 3 (three) times daily as needed for up to 7 days for cough. 01/06/20 01/13/20  Aeralyn Barna C, PA-C  dextromethorphan-guaiFENesin (MUCINEX DM) 30-600 MG 12hr tablet Take 1 tablet by mouth 2 (two) times daily. 01/06/20   Chevonne Bostrom C, PA-C  fluticasone (FLONASE) 50 MCG/ACT nasal spray Place 1-2 sprays into both nostrils daily. 01/06/20   Adelia Baptista C, PA-C  ondansetron (ZOFRAN) 4 MG tablet Take 1 tablet (4 mg total) by mouth every 6 (six) hours. 11/17/19   Frederica Kuster, MD  ondansetron (ZOFRAN-ODT) 4 MG disintegrating tablet Take 1 tablet (4 mg total) by mouth every 8 (eight) hours as needed for nausea or vomiting. 12/14/19   Mardella Layman, MD  tiZANidine (ZANAFLEX) 4 MG tablet Take 1 tablet (4 mg total) by mouth every 6 (six) hours as needed for muscle spasms. 04/22/19   Kerrin Champagne, MD  levothyroxine (SYNTHROID) 150 MCG tablet Take 150 mcg by mouth daily. 08/15/19 01/06/20  [provider]    Family History Family History  Problem Relation Age of Onset  . Diabetes Mother   . Diabetes Maternal Grandmother   . Stroke Father     Social History Social History   Tobacco Use  . Smoking status: Current Every Day Smoker    Packs/day: 1.00    Types: Cigarettes    Last attempt to quit: 10/17/2015    Years since quitting: 4.2  . Smokeless tobacco: Never Used  Vaping Use  . Vaping Use: Never used  Substance Use Topics  . Alcohol use: Yes     Alcohol/week: 1.0 standard drink    Types: 1 Glasses of wine per week    Comment: ocassionally  . Drug use: No     Allergies   Bee venom, Reglan [metoclopramide hcl], and Aspirin   Review of Systems Review of Systems  Constitutional: Positive for chills. Negative for activity change, appetite change, fatigue and fever.  HENT: Positive for congestion and rhinorrhea. Negative for ear pain, sinus pressure, sore throat  and trouble swallowing.   Eyes: Negative for discharge and redness.  Respiratory: Positive for cough, chest tightness and shortness of breath.   Cardiovascular: Negative for chest pain.  Gastrointestinal: Negative for abdominal pain, diarrhea, nausea and vomiting.  Musculoskeletal: Negative for myalgias.  Skin: Negative for rash.  Neurological: Positive for headaches. Negative for dizziness and light-headedness.     Physical Exam Triage Vital Signs ED Triage Vitals  Enc Vitals Group     BP      Pulse      Resp      Temp      Temp src      SpO2      Weight      Height      Head Circumference      Peak Flow      Pain Score      Pain Loc      Pain Edu?      Excl. in GC?    No data found.  Updated Vital Signs BP 129/79 (BP Location: Left Arm)   Pulse 80   Temp 98.2 F (36.8 C) (Oral)   Resp 15   Ht 5' (1.524 m)   Wt 250 lb (113.4 kg)   LMP 07/25/2012   SpO2 96%   BMI 48.83 kg/m   Visual Acuity Right Eye Distance:   Left Eye Distance:   Bilateral Distance:    Right Eye Near:   Left Eye Near:    Bilateral Near:     Physical Exam Vitals and nursing note reviewed.  Constitutional:      Appearance: She is well-developed.     Comments: No acute distress  HENT:     Head: Normocephalic and atraumatic.     Ears:     Comments: Bilateral ears without tenderness to palpation of external auricle, tragus and mastoid, EAC's without erythema or swelling, TM's with good bony landmarks and cone of light. Non erythematous.     Nose: Nose normal.      Mouth/Throat:     Comments: Oral mucosa pink and moist, no tonsillar enlargement or exudate. Posterior pharynx patent and nonerythematous, no uvula deviation or swelling. Normal phonation. Eyes:     Conjunctiva/sclera: Conjunctivae normal.  Cardiovascular:     Rate and Rhythm: Normal rate.  Pulmonary:     Effort: Pulmonary effort is normal. No respiratory distress.     Comments: Breathing comfortably at rest, CTABL, no wheezing, rales or other adventitious sounds auscultated Abdominal:     General: There is no distension.  Musculoskeletal:        General: Normal range of motion.     Cervical back: Neck supple.  Skin:    General: Skin is warm and dry.  Neurological:     Mental Status: She is alert and oriented to person, place, and time.      UC Treatments / Results  Labs (all labs ordered are listed, but only abnormal results are displayed) Labs Reviewed  NOVEL CORONAVIRUS, NAA    EKG   Radiology No results found.  Procedures Procedures (including critical care time)  Medications Ordered in UC Medications - No data to display  Initial Impression / Assessment and Plan / UC Course  I have reviewed the triage vital signs and the nursing notes.  Pertinent labs & imaging results that were available during my care of the patient were reviewed by me and considered in my medical decision making (see chart for details).     Viral URI,  URI symptoms less than 1 week, suspect likely viral etiology, Covid test pending.  Recommending continued symptomatic and supportive care rest and fluids with continued monitoring.  Exam reassuring, vital signs stable, lungs clear to auscultation.  Discussed strict return precautions. Patient verbalized understanding and is agreeable with plan.  Final Clinical Impressions(s) / UC Diagnoses   Final diagnoses:  Viral URI with cough     Discharge Instructions     Covid test pending, monitor my chart for results Tessalon/benzonatate  every 8 hours for cough Mucinex DM for congestion and cough twice daily as needed Flonase nasal spray 1 to 2 spray in each nostril daily to help with sinus congestion and pressure Ibuprofen and Tylenol as needed Rest and fluids Follow-up if not improving or worsening    ED Prescriptions    Medication Sig Dispense Auth. Provider   benzonatate (TESSALON) 200 MG capsule Take 1 capsule (200 mg total) by mouth 3 (three) times daily as needed for up to 7 days for cough. 28 capsule Lenny Bouchillon C, PA-C   dextromethorphan-guaiFENesin (MUCINEX DM) 30-600 MG 12hr tablet Take 1 tablet by mouth 2 (two) times daily. 20 tablet Nymir Ringler C, PA-C   fluticasone (FLONASE) 50 MCG/ACT nasal spray Place 1-2 sprays into both nostrils daily. 16 g Rania Prothero, Apison C, PA-C     PDMP not reviewed this encounter.   Lew Dawes, PA-C 01/06/20 1131

## 2020-01-06 NOTE — Discharge Instructions (Addendum)
Covid test pending, monitor my chart for results Tessalon/benzonatate every 8 hours for cough Mucinex DM for congestion and cough twice daily as needed Flonase nasal spray 1 to 2 spray in each nostril daily to help with sinus congestion and pressure Ibuprofen and Tylenol as needed Rest and fluids Follow-up if not improving or worsening

## 2020-01-06 NOTE — ED Triage Notes (Signed)
Patient arrived by self from home.   Patient c/o nasal congestion and cough x 5 days.  Patient denies nasal drainage, sputum production, and fever.   Patient states she hasn't been around anyone with COVID-19.

## 2020-01-07 LAB — NOVEL CORONAVIRUS, NAA: SARS-CoV-2, NAA: NOT DETECTED

## 2020-01-07 LAB — SARS-COV-2, NAA 2 DAY TAT

## 2020-01-15 ENCOUNTER — Ambulatory Visit: Payer: Self-pay

## 2020-01-15 ENCOUNTER — Ambulatory Visit
Admission: EM | Admit: 2020-01-15 | Discharge: 2020-01-15 | Disposition: A | Discharge status: Home / Self Care | Payer: Medicaid Other | Attending: Physician Assistant | Admitting: Physician Assistant

## 2020-01-15 ENCOUNTER — Other Ambulatory Visit: Payer: Self-pay

## 2020-01-15 DIAGNOSIS — N898 Other specified noninflammatory disorders of vagina: Secondary | ICD-10-CM | POA: Diagnosis not present

## 2020-01-15 DIAGNOSIS — R319 Hematuria, unspecified: Secondary | ICD-10-CM | POA: Diagnosis not present

## 2020-01-15 HISTORY — DX: Disorder of thyroid, unspecified: E07.9

## 2020-01-15 LAB — POCT URINALYSIS DIP (MANUAL ENTRY)
Bilirubin, UA: NEGATIVE
Glucose, UA: NEGATIVE mg/dL
Ketones, POC UA: NEGATIVE mg/dL
Leukocytes, UA: NEGATIVE
Nitrite, UA: NEGATIVE
Protein Ur, POC: 30 mg/dL — AB
Spec Grav, UA: >=1.03 — AB (ref 1.010–1.025)
Urobilinogen, UA: 0.2 E.U./dL
pH, UA: 6 (ref 5.0–8.0)

## 2020-01-15 MED ORDER — FLUCONAZOLE 150 MG PO TABS: 150.0000 mg | ORAL_TABLET | Freq: Every day | ORAL | 0 refills | Status: DC

## 2020-01-15 NOTE — ED Provider Notes (Signed)
[facility]    CSN: [id number] Arrival date & time: 01/15/20  1530      History   Chief Complaint Chief Complaint  Patient presents with  . Urinary Tract Infection    HPI Yvonne [name] is a 34 y.o. female.   34 year old female comes in with few day history of vaginal discharge. Describes it as white and flaky. Denies itching or odor. Also noted to have darker urine without frequency, urgency, dysuria, hematuria. Denies nausea, vomiting. States after checking in, felt some lower abdominal pressure. Denies fever. S/p hysterectomy. May have had change in condom brand, otherwise no other obvious hygiene product changes. States appearance of discharge similar to past yeast infection.     Past Medical History:  Diagnosis Date  . Anemia   . Anxiety   . 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 Asthma    albuterol inhaler  . Blood transfusion    2008 with c/s 4 units transfused  . Carpal tunnel syndrome on right   . Chronic back pain   . Depression   . Dyspnea    exertion  . Headache(784.0)    otc ibuprofen 800mg   . Heart murmur    as a child  . Obesity   . Pneumonia    as a child  . Thyroid disease     Patient Active Problem List   Diagnosis Date Noted  . S/P lumbar spinal fusion 02/13/2017  . Herniated nucleus pulposus, lumbar 04/10/2016  . HNP (herniated nucleus pulposus), lumbar 11/05/2014    Class: Chronic  . S/P endometrial ablation - HTA 2012 06/03/2012  . Sterilization - h/o BTL 06/03/2012  . OBESITY 09/03/2007  . MIGRAINE HEADACHE 09/03/2007    Past Surgical History:  Procedure Laterality Date  . ABDOMINAL HYSTERECTOMY N/A 09/29/2012   Procedure: HYSTERECTOMY ABDOMINAL;  Surgeon: 10/01/2012, MD;  Location: [facility];  Service: Gynecology;  Laterality: N/A;  . APPENDECTOMY    . BACK SURGERY    . BILATERAL SALPINGECTOMY Bilateral 09/29/2012   Procedure: BILATERAL SALPINGECTOMY;  Surgeon: 10/01/2012,  MD;  Location: [facility];  Service: Gynecology;  Laterality: Bilateral;  . CESAREAN SECTION    . CHOLECYSTECTOMY    . CYSTOSCOPY Bilateral 09/29/2012   Procedure: CYSTOSCOPY;  Surgeon: 10/01/2012, MD;  Location: [facility];  Service: Gynecology;  Laterality: Bilateral;  . ENDOMETRIAL ABLATION    . EXAMINATION UNDER ANESTHESIA N/A 10/16/2012   Procedure: EXAM UNDER ANESTHESIA;  Surgeon: 10/18/2012, MD;  Location: [facility];  Service: Gynecology;  Laterality: N/A;  Suturing Vaginal Cuff  . HAND SURGERY Right    carpal tunnel  . LAPAROSCOPIC ASSISTED VAGINAL HYSTERECTOMY N/A 09/29/2012   Procedure: LAPAROSCOPIC ASSISTED VAGINAL HYSTERECTOMY;  Surgeon: 10/01/2012, MD;  Location: [facility];  Service: Gynecology;  Laterality: N/A;  . LAPAROSCOPIC HYSTERECTOMY Bilateral 09/29/2012   Procedure: HYSTERECTOMY TOTAL LAPAROSCOPIC;  Surgeon: 10/01/2012, MD;  Location: [facility];  Service: Gynecology;  Laterality: Bilateral;  Total Laparoscopic Hysterectomy, Bilateral Salpingectomies, Cystoscopy poss. LAVH poss. TAH  . LAPAROSCOPY     x3  . LUMBAR LAMINECTOMY N/A 11/05/2014   Procedure: RIGHT L4-5 MICRODISCECTOMY ;  Surgeon: 01/05/2015, MD;  Location: [facility];  Service: Orthopedics;  Laterality: N/A;  . LUMBAR LAMINECTOMY/DECOMPRESSION MICRODISCECTOMY N/A 04/10/2016   Procedure: Right L4-5 Microdiscectomy;  Surgeon: 06/08/2016, MD;  Location: [facility];  Service: Orthopedics;  Laterality: N/A;  . THYROIDECTOMY    . THYROIDECTOMY    . TONSILLECTOMY     T & A  . TRANSFORAMINAL LUMBAR INTERBODY FUSION (TLIF) WITH PEDICLE SCREW FIXATION 1 LEVEL N/A 02/13/2017   Procedure: Transforaminal Lumbar Interbody Fusion - Lumbar four-five;  Surgeon: Tia Alert, MD;  Location: [facility] OR;  Service: Neurosurgery;  Laterality: N/A;  . TUBAL LIGATION      OB History    Gravida  4   Para  4   Term  4   Preterm      AB      Living  1     SAB      TAB      Ectopic      Multiple      Live Births  1             Home Medications    Prior to Admission medications   Medication Sig Start Date End Date Taking? Authorizing Provider  fluconazole (DIFLUCAN) 150 MG tablet Take 1 tablet (150 mg total) by mouth daily. Take second dose 72 hours later if symptoms still persists. 01/15/20   Cathie Hoops, Tivis Wherry V, PA-C  fluticasone (FLONASE) 50 MCG/ACT nasal spray Place 1-2 sprays into both nostrils daily. 01/06/20   Wieters, Hallie C, PA-C  HYDROcodone-acetaminophen (NORCO/VICODIN) 5-325 MG tablet Take 1 tablet by mouth every 6 (six) hours as needed for moderate pain. 05/14/19   Kerrin Champagne, MD  ibuprofen (ADVIL) 800 MG tablet TAKE 1 TABLET(800 MG) BY MOUTH EVERY 8 HOURS AS NEEDED FOR MODERATE PAIN 10/30/19   Kerrin Champagne, MD  ondansetron (ZOFRAN) 4 MG tablet Take 1 tablet (4 mg total) by mouth every 6 (six) hours. 11/17/19   Frederica Kuster, MD  ondansetron (ZOFRAN-ODT) 4 MG disintegrating tablet Take 1 tablet (4 mg total) by mouth every 8 (eight) hours as needed for nausea or vomiting. 12/14/19   Mardella Layman, MD  QUEtiapine (SEROQUEL) 300 MG tablet Take 300 mg by mouth at bedtime.    [provider]  levothyroxine (SYNTHROID) 150 MCG tablet Take 150 mcg by mouth daily. 08/15/19 01/06/20  [provider]    Family History Family History  Problem Relation Age of Onset  . Diabetes Mother   . Diabetes Maternal Grandmother   . Stroke Father     Social History Social History   Tobacco Use  . Smoking status: Current Every Day Smoker    Packs/day: 1.00    Types: Cigarettes    Last attempt to quit: 10/17/2015    Years since quitting: 4.2  . Smokeless tobacco: Never Used  Vaping Use  . Vaping Use: Never used  Substance Use Topics  . Alcohol use: Yes    Alcohol/week: 1.0 standard drink    Types: 1 Glasses of wine per week    Comment: ocassionally  . Drug use: No     Allergies   Bee venom, Reglan [metoclopramide hcl], and Aspirin   Review of Systems Review of Systems  Reason  unable to perform ROS: See HPI as above.     Physical Exam Triage Vital Signs ED Triage Vitals [01/15/20 1706]  Enc Vitals Group     BP      Pulse      Resp      Temp      Temp src      SpO2      Weight      Height      Head  Circumference      Peak Flow      Pain Score 0     Pain Loc      Pain Edu?      Excl. in GC?    No data found.  Updated Vital Signs LMP 07/25/2012   Physical Exam Constitutional:      General: She is not in acute distress.    Appearance: She is well-developed. She is not ill-appearing, toxic-appearing or diaphoretic.  HENT:     Head: Normocephalic and atraumatic.  Eyes:     Conjunctiva/sclera: Conjunctivae normal.     Pupils: Pupils are equal, round, and reactive to light.  Cardiovascular:     Rate and Rhythm: Normal rate and regular rhythm.  Pulmonary:     Effort: Pulmonary effort is normal. No respiratory distress.     Comments: LCTAB Abdominal:     General: Bowel sounds are normal.     Palpations: Abdomen is soft.     Tenderness: There is no abdominal tenderness. There is no right CVA tenderness, left CVA tenderness, guarding or rebound.  Musculoskeletal:     Cervical back: Normal range of motion and neck supple.  Skin:    General: Skin is warm and dry.  Neurological:     Mental Status: She is alert and oriented to person, place, and time.  Psychiatric:        Behavior: Behavior normal.        Judgment: Judgment normal.      UC Treatments / Results  Labs (all labs ordered are listed, but only abnormal results are displayed) Labs Reviewed  POCT URINALYSIS DIP (MANUAL ENTRY) - Abnormal; Notable for the following components:      Result Value   Spec Grav, UA >=1.030 (*)    Blood, UA moderate (*)    Protein Ur, POC =30 (*)    All other components within normal limits  CERVICOVAGINAL ANCILLARY ONLY    EKG   Radiology No results found.  Procedures Procedures (including critical care time)  Medications Ordered in  UC Medications - No data to display  Initial Impression / Assessment and Plan / UC Course  I have reviewed the triage vital signs and the nursing notes.  Pertinent labs & imaging results that were available during my care of the patient were reviewed by me and considered in my medical decision making (see chart for details).    1. Vaginal discharge Patient was treated empirically for yeast, diflucan as directed. Cytology sent, patient will be contacted with any positive results that require additional treatment. Patient to refrain from sexual activity for the next 7 days. Return precautions given.   2. Hematuria Discussed dipstick results with patient. She states hematuria is baseline for her. Unsure if workup done in the past for asymptomatic microscopic hematuria. Did see her PCP approx 2 months ago and stated had hematuria on dipstick as well. Will have patient follow with PCP to see if workup is needed. Return precautions given.  Final Clinical Impressions(s) / UC Diagnoses   Final diagnoses:  Vaginal discharge  Hematuria, unspecified type    ED Prescriptions    Medication Sig Dispense Auth. Provider   fluconazole (DIFLUCAN) 150 MG tablet Take 1 tablet (150 mg total) by mouth daily. Take second dose 72 hours later if symptoms still persists. 2 tablet Belinda Fisher, PA-C     PDMP not reviewed this encounter.   Belinda Fisher, PA-C 01/16/20 [phone]

## 2020-01-15 NOTE — Discharge Instructions (Addendum)
Vaginal discharge You were treated empirically for yeast. Start diflucan as directed. Cytology sent, you will be contacted with any positive results that requires further treatment. Refrain from sexual activity and alcohol use for the next 7 days. Monitor for any worsening of symptoms, fever, abdominal pain, nausea, vomiting.  2. Blood in urine As discussed, please discuss this with PCP to see if you need urology follow up.

## 2020-01-15 NOTE — ED Triage Notes (Signed)
Pt c/o lower abdominal pressure after having sexual intercourse last night. States having white vaginal discharge also.

## 2020-01-19 ENCOUNTER — Telehealth (HOSPITAL_COMMUNITY): Payer: Self-pay | Admitting: Emergency Medicine

## 2020-01-19 ENCOUNTER — Telehealth: Payer: Self-pay | Admitting: Emergency Medicine

## 2020-01-19 LAB — CERVICOVAGINAL ANCILLARY ONLY
Bacterial Vaginitis (gardnerella): POSITIVE — AB
Candida Glabrata: NEGATIVE
Candida Vaginitis: POSITIVE — AB
Chlamydia: NEGATIVE
Comment: NEGATIVE
Comment: NEGATIVE
Comment: NEGATIVE
Comment: NEGATIVE
Comment: NEGATIVE
Comment: NORMAL
Neisseria Gonorrhea: NEGATIVE
Trichomonas: NEGATIVE

## 2020-01-19 MED ORDER — METRONIDAZOLE 500 MG PO TABS: 500.0000 mg | ORAL_TABLET | Freq: Two times a day (BID) | ORAL | 0 refills | Status: DC

## 2020-01-21 DIAGNOSIS — G894 Chronic pain syndrome: Secondary | ICD-10-CM | POA: Diagnosis not present

## 2020-01-21 DIAGNOSIS — M62830 Muscle spasm of back: Secondary | ICD-10-CM | POA: Diagnosis not present

## 2020-01-21 DIAGNOSIS — M4327 Fusion of spine, lumbosacral region: Secondary | ICD-10-CM | POA: Diagnosis not present

## 2020-01-21 DIAGNOSIS — Z7189 Other specified counseling: Secondary | ICD-10-CM | POA: Diagnosis not present

## 2020-01-21 DIAGNOSIS — Z79899 Other long term (current) drug therapy: Secondary | ICD-10-CM | POA: Diagnosis not present

## 2020-01-21 DIAGNOSIS — F17210 Nicotine dependence, cigarettes, uncomplicated: Secondary | ICD-10-CM | POA: Diagnosis not present

## 2020-01-22 DIAGNOSIS — F331 Major depressive disorder, recurrent, moderate: Secondary | ICD-10-CM | POA: Diagnosis not present

## 2020-01-22 DIAGNOSIS — R319 Hematuria, unspecified: Secondary | ICD-10-CM | POA: Diagnosis not present

## 2020-01-22 DIAGNOSIS — E039 Hypothyroidism, unspecified: Secondary | ICD-10-CM | POA: Diagnosis not present

## 2020-02-01 DIAGNOSIS — Z419 Encounter for procedure for purposes other than remedying health state, unspecified: Secondary | ICD-10-CM | POA: Diagnosis not present

## 2020-02-06 DIAGNOSIS — F331 Major depressive disorder, recurrent, moderate: Secondary | ICD-10-CM | POA: Diagnosis not present

## 2020-02-07 DIAGNOSIS — F331 Major depressive disorder, recurrent, moderate: Secondary | ICD-10-CM | POA: Diagnosis not present

## 2020-02-08 DIAGNOSIS — B9689 Other specified bacterial agents as the cause of diseases classified elsewhere: Secondary | ICD-10-CM | POA: Diagnosis not present

## 2020-02-08 DIAGNOSIS — T25122A Burn of first degree of left foot, initial encounter: Secondary | ICD-10-CM | POA: Diagnosis not present

## 2020-02-08 DIAGNOSIS — N760 Acute vaginitis: Secondary | ICD-10-CM | POA: Diagnosis not present

## 2020-02-13 DIAGNOSIS — F331 Major depressive disorder, recurrent, moderate: Secondary | ICD-10-CM | POA: Diagnosis not present

## 2020-02-14 DIAGNOSIS — F331 Major depressive disorder, recurrent, moderate: Secondary | ICD-10-CM | POA: Diagnosis not present

## 2020-02-16 DIAGNOSIS — F331 Major depressive disorder, recurrent, moderate: Secondary | ICD-10-CM | POA: Diagnosis not present

## 2020-02-19 DIAGNOSIS — F331 Major depressive disorder, recurrent, moderate: Secondary | ICD-10-CM | POA: Diagnosis not present

## 2020-02-24 ENCOUNTER — Ambulatory Visit (HOSPITAL_COMMUNITY)
Admission: EM | Admit: 2020-02-24 | Discharge: 2020-02-24 | Disposition: A | Discharge status: Home / Self Care | Payer: Medicaid Other

## 2020-02-24 ENCOUNTER — Ambulatory Visit (HOSPITAL_COMMUNITY): Admit: 2020-02-24 | Discharge status: Absent without leave | Payer: Medicaid Other

## 2020-02-24 ENCOUNTER — Other Ambulatory Visit: Payer: Self-pay

## 2020-02-24 ENCOUNTER — Encounter (HOSPITAL_COMMUNITY): Payer: Self-pay

## 2020-02-24 ENCOUNTER — Ambulatory Visit (HOSPITAL_COMMUNITY): Payer: Self-pay

## 2020-02-24 DIAGNOSIS — B373 Candidiasis of vulva and vagina: Secondary | ICD-10-CM

## 2020-02-24 DIAGNOSIS — B3731 Acute candidiasis of vulva and vagina: Secondary | ICD-10-CM

## 2020-02-24 MED ORDER — FLUCONAZOLE 150 MG PO TABS: 150.0000 mg | ORAL_TABLET | Freq: Every day | ORAL | 1 refills | Status: DC

## 2020-02-24 NOTE — ED Triage Notes (Signed)
Pt presents presents with white vaginal discharge x 1 week. She states she saw her PCP and she was not given medication for her diagnosis.

## 2020-02-24 NOTE — ED Provider Notes (Signed)
[facility]    CSN: [id number] Arrival date & time: 02/24/20  0844      History   Chief Complaint Chief Complaint  Patient presents with  . Vaginal Discharge    HPI Yvonne [name] is a 34 y.o. female.   The history is provided by the patient.  Vaginal Discharge Quality:  White Severity:  Moderate Onset quality:  Gradual Timing:  Constant Progression:  Worsening Chronicity:  New Relieved by:  Nothing Worsened by:  Nothing Ineffective treatments:  None tried Associated symptoms: vaginal itching   Risk factors: STI exposure     Past Medical History:  Diagnosis Date  . Anemia   . Anxiety   . 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 Asthma    albuterol inhaler  . Blood transfusion    2008 with c/s 4 units transfused  . Carpal tunnel syndrome on right   . Chronic back pain   . Depression   . Dyspnea    exertion  . Headache(784.0)    otc ibuprofen 800mg   . Heart murmur    as a child  . Obesity   . Pneumonia    as a child  . Thyroid disease     Patient Active Problem List   Diagnosis Date Noted  . S/P lumbar spinal fusion 02/13/2017  . Herniated nucleus pulposus, lumbar 04/10/2016  . HNP (herniated nucleus pulposus), lumbar 11/05/2014    Class: Chronic  . S/P endometrial ablation - HTA 2012 06/03/2012  . Sterilization - h/o BTL 06/03/2012  . OBESITY 09/03/2007  . MIGRAINE HEADACHE 09/03/2007    Past Surgical History:  Procedure Laterality Date  . ABDOMINAL HYSTERECTOMY N/A 09/29/2012   Procedure: HYSTERECTOMY ABDOMINAL;  Surgeon: 10/01/2012, MD;  Location: [facility];  Service: Gynecology;  Laterality: N/A;  . APPENDECTOMY    . BACK SURGERY    . BILATERAL SALPINGECTOMY Bilateral 09/29/2012   Procedure: BILATERAL SALPINGECTOMY;  Surgeon: 10/01/2012, MD;  Location: [facility];  Service: Gynecology;  Laterality: Bilateral;  . CESAREAN SECTION    . CHOLECYSTECTOMY    . CYSTOSCOPY Bilateral 09/29/2012    Procedure: CYSTOSCOPY;  Surgeon: 10/01/2012, MD;  Location: [facility];  Service: Gynecology;  Laterality: Bilateral;  . ENDOMETRIAL ABLATION    . EXAMINATION UNDER ANESTHESIA N/A 10/16/2012   Procedure: EXAM UNDER ANESTHESIA;  Surgeon: 10/18/2012, MD;  Location: [facility];  Service: Gynecology;  Laterality: N/A;  Suturing Vaginal Cuff  . HAND SURGERY Right    carpal tunnel  . LAPAROSCOPIC ASSISTED VAGINAL HYSTERECTOMY N/A 09/29/2012   Procedure: LAPAROSCOPIC ASSISTED VAGINAL HYSTERECTOMY;  Surgeon: 10/01/2012, MD;  Location: [facility];  Service: Gynecology;  Laterality: N/A;  . LAPAROSCOPIC HYSTERECTOMY Bilateral 09/29/2012   Procedure: HYSTERECTOMY TOTAL LAPAROSCOPIC;  Surgeon: 10/01/2012, MD;  Location: [facility];  Service: Gynecology;  Laterality: Bilateral;  Total Laparoscopic Hysterectomy, Bilateral Salpingectomies, Cystoscopy poss. LAVH poss. TAH  . LAPAROSCOPY     x3  . LUMBAR LAMINECTOMY N/A 11/05/2014   Procedure: RIGHT L4-5 MICRODISCECTOMY ;  Surgeon: 01/05/2015, MD;  Location: [facility];  Service: Orthopedics;  Laterality: N/A;  . LUMBAR LAMINECTOMY/DECOMPRESSION MICRODISCECTOMY N/A 04/10/2016   Procedure: Right L4-5 Microdiscectomy;  Surgeon: 06/08/2016, MD;  Location: [facility];  Service: Orthopedics;  Laterality: N/A;  . THYROIDECTOMY    . THYROIDECTOMY    . TONSILLECTOMY     T &  A  . TRANSFORAMINAL LUMBAR INTERBODY FUSION (TLIF) WITH PEDICLE SCREW FIXATION 1 LEVEL N/A 02/13/2017   Procedure: Transforaminal Lumbar Interbody Fusion - Lumbar four-five;  Surgeon: Tia Alert, MD;  Location: [facility];  Service: Neurosurgery;  Laterality: N/A;  . TUBAL LIGATION      OB History    Gravida  4   Para  4   Term  4   Preterm      AB      Living  1     SAB      TAB      Ectopic      Multiple      Live Births  1            Home Medications    Prior to Admission medications   Medication Sig Start Date End Date Taking? Authorizing Provider  clindamycin  (CLEOCIN) 300 MG capsule Take 300 mg by mouth 2 (two) times daily. 02/08/20   [provider]  fluconazole (DIFLUCAN) 150 MG tablet Take 1 tablet (150 mg total) by mouth daily. 02/24/20   Elson Areas, PA-C  fluticasone (FLONASE) 50 MCG/ACT nasal spray Place 1-2 sprays into both nostrils daily. 01/06/20   Wieters, Hallie C, PA-C  HYDROcodone-acetaminophen (NORCO/VICODIN) 5-325 MG tablet Take 1 tablet by mouth every 6 (six) hours as needed for moderate pain. 05/14/19   Kerrin Champagne, MD  ibuprofen (ADVIL) 800 MG tablet TAKE 1 TABLET(800 MG) BY MOUTH EVERY 8 HOURS AS NEEDED FOR MODERATE PAIN 10/30/19   Kerrin Champagne, MD  metroNIDAZOLE (FLAGYL) 500 MG tablet Take 1 tablet (500 mg total) by mouth 2 (two) times daily. 01/19/20   Lamptey, Britta Mccreedy, MD  ondansetron (ZOFRAN) 4 MG tablet Take 1 tablet (4 mg total) by mouth every 6 (six) hours. 11/17/19   Frederica Kuster, MD  ondansetron (ZOFRAN-ODT) 4 MG disintegrating tablet Take 1 tablet (4 mg total) by mouth every 8 (eight) hours as needed for nausea or vomiting. 12/14/19   Mardella Layman, MD  QUEtiapine (SEROQUEL) 300 MG tablet Take 300 mg by mouth at bedtime.    [provider]  levothyroxine (SYNTHROID) 150 MCG tablet Take 150 mcg by mouth daily. 08/15/19 01/06/20  [provider]    Family History Family History  Problem Relation Age of Onset  . Diabetes Mother   . Diabetes Maternal Grandmother   . Stroke Father     Social History Social History   Tobacco Use  . Smoking status: Current Every Day Smoker    Packs/day: 1.00    Types: Cigarettes    Last attempt to quit: 10/17/2015    Years since quitting: 4.3  . Smokeless tobacco: Never Used  Vaping Use  . Vaping Use: Never used  Substance Use Topics  . Alcohol use: Yes    Alcohol/week: 1.0 standard drink    Types: 1 Glasses of wine per week    Comment: ocassionally  . Drug use: No     Allergies   Bee venom, Reglan [metoclopramide hcl], and  Aspirin   Review of Systems Review of Systems  Genitourinary: Positive for vaginal discharge.  All other systems reviewed and are negative.    Physical Exam Triage Vital Signs ED Triage Vitals  Enc Vitals Group     BP 02/24/20 0911 129/81     Pulse Rate 02/24/20 0911 89     Resp 02/24/20 0911 16     Temp 02/24/20 0911 98.9 F (37.2 C)  Temp src --      SpO2 02/24/20 0911 98 %     Weight --      Height --      Head Circumference --      Peak Flow --      Pain Score 02/24/20 0909 0     Pain Loc --      Pain Edu? --      Excl. in GC? --    No data found.  Updated Vital Signs BP 129/81 (BP Location: Right Arm)   Pulse 89   Temp 98.9 F (37.2 C)   Resp 16   LMP 07/25/2012   SpO2 98%   Visual Acuity Right Eye Distance:   Left Eye Distance:   Bilateral Distance:    Right Eye Near:   Left Eye Near:    Bilateral Near:     Physical Exam Vitals and nursing note reviewed.  Constitutional:      Appearance: She is well-developed.  HENT:     Head: Normocephalic.  Cardiovascular:     Rate and Rhythm: Normal rate.  Pulmonary:     Effort: Pulmonary effort is normal.  Abdominal:     General: Abdomen is flat. There is no distension.  Musculoskeletal:        General: Normal range of motion.     Cervical back: Normal range of motion. Rigidity present.  Skin:    General: Skin is warm.  Neurological:     General: No focal deficit present.     Mental Status: She is alert and oriented to person, place, and time.  Psychiatric:        Mood and Affect: Mood normal.      UC Treatments / Results  Labs (all labs ordered are listed, but only abnormal results are displayed) Labs Reviewed - No data to display  EKG   Radiology No results found.  Procedures Procedures (including critical care time)  Medications Ordered in UC Medications - No data to display  Initial Impression / Assessment and Plan / UC Course  I have reviewed the triage vital signs and the  nursing notes.  Pertinent labs & imaging results that were available during my care of the patient were reviewed by me and considered in my medical decision making (see chart for details).     MDM:  Pt given rx for diflucan.  Pt advised to follow up with primary care.  Final Clinical Impressions(s) / UC Diagnoses   Final diagnoses:  Yeast vaginitis     Discharge Instructions     Return if any problems.    ED Prescriptions    Medication Sig Dispense Auth. Provider   fluconazole (DIFLUCAN) 150 MG tablet Take 1 tablet (150 mg total) by mouth daily. 1 tablet Elson Areas, [place]     PDMP not reviewed this encounter.  An After Visit Summary was printed and given to the patient.    Elson Areas, [place] 02/24/20 [phone]

## 2020-02-24 NOTE — Discharge Instructions (Signed)
Return if any problems.

## 2020-03-01 DIAGNOSIS — R8290 Unspecified abnormal findings in urine: Secondary | ICD-10-CM | POA: Diagnosis not present

## 2020-03-01 DIAGNOSIS — Z79899 Other long term (current) drug therapy: Secondary | ICD-10-CM | POA: Diagnosis not present

## 2020-03-01 DIAGNOSIS — R3129 Other microscopic hematuria: Secondary | ICD-10-CM | POA: Diagnosis not present

## 2020-03-01 DIAGNOSIS — F17210 Nicotine dependence, cigarettes, uncomplicated: Secondary | ICD-10-CM | POA: Diagnosis not present

## 2020-03-01 DIAGNOSIS — M4327 Fusion of spine, lumbosacral region: Secondary | ICD-10-CM | POA: Diagnosis not present

## 2020-03-01 DIAGNOSIS — M62830 Muscle spasm of back: Secondary | ICD-10-CM | POA: Diagnosis not present

## 2020-03-01 DIAGNOSIS — G894 Chronic pain syndrome: Secondary | ICD-10-CM | POA: Diagnosis not present

## 2020-03-02 ENCOUNTER — Other Ambulatory Visit: Payer: Self-pay | Admitting: Specialist

## 2020-03-02 DIAGNOSIS — Z419 Encounter for procedure for purposes other than remedying health state, unspecified: Secondary | ICD-10-CM | POA: Diagnosis not present

## 2020-03-10 DIAGNOSIS — F331 Major depressive disorder, recurrent, moderate: Secondary | ICD-10-CM | POA: Diagnosis not present

## 2020-03-11 DIAGNOSIS — F331 Major depressive disorder, recurrent, moderate: Secondary | ICD-10-CM | POA: Diagnosis not present

## 2020-03-16 DIAGNOSIS — F331 Major depressive disorder, recurrent, moderate: Secondary | ICD-10-CM | POA: Diagnosis not present

## 2020-03-19 DIAGNOSIS — R319 Hematuria, unspecified: Secondary | ICD-10-CM | POA: Diagnosis not present

## 2020-03-19 DIAGNOSIS — Z981 Arthrodesis status: Secondary | ICD-10-CM | POA: Diagnosis not present

## 2020-03-19 DIAGNOSIS — R109 Unspecified abdominal pain: Secondary | ICD-10-CM | POA: Diagnosis not present

## 2020-03-19 DIAGNOSIS — R3129 Other microscopic hematuria: Secondary | ICD-10-CM | POA: Diagnosis not present

## 2020-03-19 DIAGNOSIS — Z9049 Acquired absence of other specified parts of digestive tract: Secondary | ICD-10-CM | POA: Diagnosis not present

## 2020-03-23 DIAGNOSIS — F331 Major depressive disorder, recurrent, moderate: Secondary | ICD-10-CM | POA: Diagnosis not present

## 2020-03-28 DIAGNOSIS — R3129 Other microscopic hematuria: Secondary | ICD-10-CM | POA: Diagnosis not present

## 2020-03-31 DIAGNOSIS — M62830 Muscle spasm of back: Secondary | ICD-10-CM | POA: Diagnosis not present

## 2020-03-31 DIAGNOSIS — Z7185 Encounter for immunization safety counseling: Secondary | ICD-10-CM | POA: Diagnosis not present

## 2020-03-31 DIAGNOSIS — G894 Chronic pain syndrome: Secondary | ICD-10-CM | POA: Diagnosis not present

## 2020-03-31 DIAGNOSIS — Z79899 Other long term (current) drug therapy: Secondary | ICD-10-CM | POA: Diagnosis not present

## 2020-03-31 DIAGNOSIS — M4327 Fusion of spine, lumbosacral region: Secondary | ICD-10-CM | POA: Diagnosis not present

## 2020-03-31 DIAGNOSIS — F17210 Nicotine dependence, cigarettes, uncomplicated: Secondary | ICD-10-CM | POA: Diagnosis not present

## 2020-04-02 DIAGNOSIS — Z419 Encounter for procedure for purposes other than remedying health state, unspecified: Secondary | ICD-10-CM | POA: Diagnosis not present

## 2020-04-06 ENCOUNTER — Ambulatory Visit: Admit: 2020-04-06 | Discharge status: Against Medical Advice | Payer: Medicaid Other

## 2020-04-06 DIAGNOSIS — N898 Other specified noninflammatory disorders of vagina: Secondary | ICD-10-CM | POA: Diagnosis not present

## 2020-04-06 DIAGNOSIS — B373 Candidiasis of vulva and vagina: Secondary | ICD-10-CM | POA: Diagnosis not present

## 2020-04-06 DIAGNOSIS — N760 Acute vaginitis: Secondary | ICD-10-CM | POA: Diagnosis not present

## 2020-04-08 DIAGNOSIS — F331 Major depressive disorder, recurrent, moderate: Secondary | ICD-10-CM | POA: Diagnosis not present

## 2020-04-15 DIAGNOSIS — B9689 Other specified bacterial agents as the cause of diseases classified elsewhere: Secondary | ICD-10-CM | POA: Diagnosis not present

## 2020-04-15 DIAGNOSIS — R748 Abnormal levels of other serum enzymes: Secondary | ICD-10-CM | POA: Diagnosis not present

## 2020-04-15 DIAGNOSIS — F331 Major depressive disorder, recurrent, moderate: Secondary | ICD-10-CM | POA: Diagnosis not present

## 2020-04-15 DIAGNOSIS — N760 Acute vaginitis: Secondary | ICD-10-CM | POA: Diagnosis not present

## 2020-04-22 DIAGNOSIS — F331 Major depressive disorder, recurrent, moderate: Secondary | ICD-10-CM | POA: Diagnosis not present

## 2020-04-26 DIAGNOSIS — M65312 Trigger thumb, left thumb: Secondary | ICD-10-CM | POA: Diagnosis not present

## 2020-04-26 DIAGNOSIS — G5623 Lesion of ulnar nerve, bilateral upper limbs: Secondary | ICD-10-CM | POA: Diagnosis not present

## 2020-04-26 DIAGNOSIS — G5603 Carpal tunnel syndrome, bilateral upper limbs: Secondary | ICD-10-CM | POA: Diagnosis not present

## 2020-04-29 DIAGNOSIS — F331 Major depressive disorder, recurrent, moderate: Secondary | ICD-10-CM | POA: Diagnosis not present

## 2020-05-02 DIAGNOSIS — G894 Chronic pain syndrome: Secondary | ICD-10-CM | POA: Diagnosis not present

## 2020-05-02 DIAGNOSIS — M4327 Fusion of spine, lumbosacral region: Secondary | ICD-10-CM | POA: Diagnosis not present

## 2020-05-02 DIAGNOSIS — F17210 Nicotine dependence, cigarettes, uncomplicated: Secondary | ICD-10-CM | POA: Diagnosis not present

## 2020-05-02 DIAGNOSIS — Z79899 Other long term (current) drug therapy: Secondary | ICD-10-CM | POA: Diagnosis not present

## 2020-05-02 DIAGNOSIS — Z7185 Encounter for immunization safety counseling: Secondary | ICD-10-CM | POA: Diagnosis not present

## 2020-05-03 DIAGNOSIS — Z419 Encounter for procedure for purposes other than remedying health state, unspecified: Secondary | ICD-10-CM | POA: Diagnosis not present

## 2020-05-06 DIAGNOSIS — F331 Major depressive disorder, recurrent, moderate: Secondary | ICD-10-CM | POA: Diagnosis not present

## 2020-05-10 ENCOUNTER — Other Ambulatory Visit: Payer: Self-pay

## 2020-05-10 ENCOUNTER — Ambulatory Visit
Admission: EM | Admit: 2020-05-10 | Discharge: 2020-05-10 | Disposition: A | Discharge status: Home / Self Care | Payer: Medicaid Other | Attending: Internal Medicine | Admitting: Internal Medicine

## 2020-05-10 DIAGNOSIS — R197 Diarrhea, unspecified: Secondary | ICD-10-CM

## 2020-05-10 DIAGNOSIS — N760 Acute vaginitis: Secondary | ICD-10-CM | POA: Diagnosis not present

## 2020-05-10 MED ORDER — METRONIDAZOLE 500 MG PO TABS: 500.0000 mg | ORAL_TABLET | Freq: Two times a day (BID) | ORAL | 0 refills | Status: DC

## 2020-05-10 MED ORDER — FLUCONAZOLE 150 MG PO TABS: 150.0000 mg | ORAL_TABLET | Freq: Every day | ORAL | 0 refills | Status: DC

## 2020-05-10 NOTE — ED Triage Notes (Signed)
Pt states tx'd for BV and yeast from PCP. States was given cream only and its not working. States had neg STD testing. States had diarrhea x1 on arrival here.

## 2020-05-10 NOTE — ED Provider Notes (Signed)
[facility]    CSN: [id number] Arrival date & time: 05/10/20  1013      History   Chief Complaint Chief Complaint  Patient presents with  . Vaginal Discharge    HPI Yvonne [name] is a 35 y.o. female who presents with unresolved vaginal itching and odor from discharge. Had + BV and candida vaginal swab, and neg STD test last week at PCP. She was prescribed Metronidazole gel but this does not help.   Onset diarrhea today x 1 fter eating a bag of chips which she did not had before. Admits also she in under a lot of stress.   Past Medical History:  Diagnosis Date  . Anemia   . Anxiety   . 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 Asthma    albuterol inhaler  . Blood transfusion    2008 with c/s 4 units transfused  . Carpal tunnel syndrome on right   . Chronic back pain   . Depression   . Dyspnea    exertion  . Headache(784.0)    otc ibuprofen 800mg   . Heart murmur    as a child  . Obesity   . Pneumonia    as a child  . Thyroid disease     Patient Active Problem List   Diagnosis Date Noted  . S/P lumbar spinal fusion 02/13/2017  . Herniated nucleus pulposus, lumbar 04/10/2016  . HNP (herniated nucleus pulposus), lumbar 11/05/2014    Class: Chronic  . S/P endometrial ablation - HTA 2012 06/03/2012  . Sterilization - h/o BTL 06/03/2012  . OBESITY 09/03/2007  . MIGRAINE HEADACHE 09/03/2007    Past Surgical History:  Procedure Laterality Date  . ABDOMINAL HYSTERECTOMY N/A 09/29/2012   Procedure: HYSTERECTOMY ABDOMINAL;  Surgeon: 10/01/2012, MD;  Location: [facility];  Service: Gynecology;  Laterality: N/A;  . APPENDECTOMY    . BACK SURGERY    . BILATERAL SALPINGECTOMY Bilateral 09/29/2012   Procedure: BILATERAL SALPINGECTOMY;  Surgeon: 10/01/2012, MD;  Location: [facility];  Service: Gynecology;  Laterality: Bilateral;  . CESAREAN SECTION    . CHOLECYSTECTOMY    . CYSTOSCOPY Bilateral 09/29/2012   Procedure:  CYSTOSCOPY;  Surgeon: 10/01/2012, MD;  Location: [facility];  Service: Gynecology;  Laterality: Bilateral;  . ENDOMETRIAL ABLATION    . EXAMINATION UNDER ANESTHESIA N/A 10/16/2012   Procedure: EXAM UNDER ANESTHESIA;  Surgeon: 10/18/2012, MD;  Location: [facility];  Service: Gynecology;  Laterality: N/A;  Suturing Vaginal Cuff  . HAND SURGERY Right    carpal tunnel  . LAPAROSCOPIC ASSISTED VAGINAL HYSTERECTOMY N/A 09/29/2012   Procedure: LAPAROSCOPIC ASSISTED VAGINAL HYSTERECTOMY;  Surgeon: 10/01/2012, MD;  Location: [facility];  Service: Gynecology;  Laterality: N/A;  . LAPAROSCOPIC HYSTERECTOMY Bilateral 09/29/2012   Procedure: HYSTERECTOMY TOTAL LAPAROSCOPIC;  Surgeon: 10/01/2012, MD;  Location: [facility];  Service: Gynecology;  Laterality: Bilateral;  Total Laparoscopic Hysterectomy, Bilateral Salpingectomies, Cystoscopy poss. LAVH poss. TAH  . LAPAROSCOPY     x3  . LUMBAR LAMINECTOMY N/A 11/05/2014   Procedure: RIGHT L4-5 MICRODISCECTOMY ;  Surgeon: 01/05/2015, MD;  Location: [facility];  Service: Orthopedics;  Laterality: N/A;  . LUMBAR LAMINECTOMY/DECOMPRESSION MICRODISCECTOMY N/A 04/10/2016   Procedure: Right L4-5 Microdiscectomy;  Surgeon: 06/08/2016, MD;  Location: [facility];  Service: Orthopedics;  Laterality: N/A;  . THYROIDECTOMY    . THYROIDECTOMY    .  TONSILLECTOMY     T & A  . TRANSFORAMINAL LUMBAR INTERBODY FUSION (TLIF) WITH PEDICLE SCREW FIXATION 1 LEVEL N/A 02/13/2017   Procedure: Transforaminal Lumbar Interbody Fusion - Lumbar four-five;  Surgeon: Tia Alert, MD;  Location: [facility];  Service: Neurosurgery;  Laterality: N/A;  . TUBAL LIGATION      OB History    Gravida  4   Para  4   Term  4   Preterm      AB      Living  1     SAB      IAB      Ectopic      Multiple      Live Births  1            Home Medications    Prior to Admission medications   Medication Sig Start Date End Date Taking? Authorizing Provider  HYDROcodone-acetaminophen  (NORCO/VICODIN) 5-325 MG tablet Take 1 tablet by mouth every 6 (six) hours as needed for moderate pain. 05/14/19   Kerrin Champagne, MD  QUEtiapine (SEROQUEL) 300 MG tablet Take 300 mg by mouth at bedtime.    [provider]  levothyroxine (SYNTHROID) 150 MCG tablet Take 150 mcg by mouth daily. 08/15/19 01/06/20  [provider]    Family History Family History  Problem Relation Age of Onset  . Diabetes Mother   . Diabetes Maternal Grandmother   . Stroke Father     Social History Social History   Tobacco Use  . Smoking status: Current Every Day Smoker    Packs/day: 1.00    Types: Cigarettes    Last attempt to quit: 10/17/2015    Years since quitting: 4.5  . Smokeless tobacco: Never Used  Vaping Use  . Vaping Use: Never used  Substance Use Topics  . Alcohol use: Yes    Alcohol/week: 1.0 standard drink    Types: 1 Glasses of wine per week    Comment: ocassionally  . Drug use: No     Allergies   Bee venom, Reglan [metoclopramide hcl], and Aspirin   Review of Systems Review of Systems  Constitutional: Negative for appetite change, chills, diaphoresis, fatigue and fever.  HENT: Negative for congestion.   Respiratory: Negative for cough.   Gastrointestinal: Positive for diarrhea. Negative for abdominal pain, blood in stool, nausea and vomiting.  Genitourinary: Positive for vaginal discharge. Negative for genital sores, pelvic pain and vaginal pain.  Musculoskeletal: Positive for back pain. Negative for myalgias.       Chronic  Back pain  Neurological: Negative for headaches.  Hematological: Negative for adenopathy.     Physical Exam Triage Vital Signs ED Triage Vitals [05/10/20 1047]  Enc Vitals Group     BP 130/85     Pulse Rate 77     Resp 18     Temp 98.2 F (36.8 C)     Temp Source Oral     SpO2 98 %     Weight      Height      Head Circumference      Peak Flow      Pain Score 0     Pain Loc      Pain Edu?      Excl. in GC?    No data  found.  Updated Vital Signs BP 130/85 (BP Location: Left Arm)   Pulse 77   Temp 98.2 F (36.8 C) (Oral)   Resp 18   LMP 07/25/2012  SpO2 98%   Visual Acuity Right Eye Distance:   Left Eye Distance:   Bilateral Distance:    Right Eye Near:   Left Eye Near:    Bilateral Near:     Physical Exam Constitutional:      Appearance: Normal appearance. She is obese.  Pulmonary:     Effort: Pulmonary effort is normal.  Abdominal:     General: Bowel sounds are normal.     Palpations: Abdomen is soft. There is no mass.     Tenderness: There is no abdominal tenderness.  Musculoskeletal:     Cervical back: Neck supple.  Neurological:     Mental Status: She is alert and oriented to person, place, and time.     Gait: Gait normal.  Psychiatric:        Mood and Affect: Mood normal.        Behavior: Behavior normal.        Thought Content: Thought content normal.        Judgment: Judgment normal.      UC Treatments / Results  Labs (all labs ordered are listed, but only abnormal results are displayed) Labs Reviewed - No data to display  EKG   Radiology No results found.  Procedures Procedures (including critical care time)  Medications Ordered in UC Medications - No data to display  Initial Impression / Assessment and Plan / UC Course  I have reviewed the triage vital signs and the nursing notes. I reviewed her vaginal swab tests and prescribed her Flagyl tablets and Diflucan as noted.  Needs to FU BRAT diet for diarrhea, could be something that was no the chips that provoked that one episode today.    Final Clinical Impressions(s) / UC Diagnoses   Final diagnoses:  None   Discharge Instructions   None    ED Prescriptions    None     PDMP not reviewed this encounter.   Garey Ham, [place] 05/10/20 1116

## 2020-05-10 NOTE — Discharge Instructions (Signed)
Stay on BRAT diet while having diarrhea

## 2020-05-13 DIAGNOSIS — F331 Major depressive disorder, recurrent, moderate: Secondary | ICD-10-CM | POA: Diagnosis not present

## 2020-05-20 DIAGNOSIS — F331 Major depressive disorder, recurrent, moderate: Secondary | ICD-10-CM | POA: Diagnosis not present

## 2020-05-23 DIAGNOSIS — M654 Radial styloid tenosynovitis [de Quervain]: Secondary | ICD-10-CM | POA: Diagnosis not present

## 2020-05-27 DIAGNOSIS — F331 Major depressive disorder, recurrent, moderate: Secondary | ICD-10-CM | POA: Diagnosis not present

## 2020-05-27 DIAGNOSIS — F330 Major depressive disorder, recurrent, mild: Secondary | ICD-10-CM | POA: Diagnosis not present

## 2020-05-27 DIAGNOSIS — G4700 Insomnia, unspecified: Secondary | ICD-10-CM | POA: Diagnosis not present

## 2020-05-27 DIAGNOSIS — F411 Generalized anxiety disorder: Secondary | ICD-10-CM | POA: Diagnosis not present

## 2020-05-30 DIAGNOSIS — M4327 Fusion of spine, lumbosacral region: Secondary | ICD-10-CM | POA: Diagnosis not present

## 2020-05-30 DIAGNOSIS — G894 Chronic pain syndrome: Secondary | ICD-10-CM | POA: Diagnosis not present

## 2020-05-30 DIAGNOSIS — Z79899 Other long term (current) drug therapy: Secondary | ICD-10-CM | POA: Diagnosis not present

## 2020-05-30 DIAGNOSIS — F17210 Nicotine dependence, cigarettes, uncomplicated: Secondary | ICD-10-CM | POA: Diagnosis not present

## 2020-05-30 DIAGNOSIS — Z7185 Encounter for immunization safety counseling: Secondary | ICD-10-CM | POA: Diagnosis not present

## 2020-05-31 DIAGNOSIS — Z419 Encounter for procedure for purposes other than remedying health state, unspecified: Secondary | ICD-10-CM | POA: Diagnosis not present

## 2020-06-03 DIAGNOSIS — F331 Major depressive disorder, recurrent, moderate: Secondary | ICD-10-CM | POA: Diagnosis not present

## 2020-06-10 DIAGNOSIS — F331 Major depressive disorder, recurrent, moderate: Secondary | ICD-10-CM | POA: Diagnosis not present

## 2020-06-15 DIAGNOSIS — G43011 Migraine without aura, intractable, with status migrainosus: Secondary | ICD-10-CM | POA: Diagnosis not present

## 2020-06-17 DIAGNOSIS — F331 Major depressive disorder, recurrent, moderate: Secondary | ICD-10-CM | POA: Diagnosis not present

## 2020-06-21 DIAGNOSIS — H5213 Myopia, bilateral: Secondary | ICD-10-CM | POA: Diagnosis not present

## 2020-06-23 DIAGNOSIS — N760 Acute vaginitis: Secondary | ICD-10-CM | POA: Diagnosis not present

## 2020-06-23 DIAGNOSIS — B9689 Other specified bacterial agents as the cause of diseases classified elsewhere: Secondary | ICD-10-CM | POA: Diagnosis not present

## 2020-06-24 ENCOUNTER — Ambulatory Visit
Admission: EM | Admit: 2020-06-24 | Discharge: 2020-06-24 | Disposition: A | Discharge status: Home / Self Care | Payer: Medicaid Other | Attending: Emergency Medicine | Admitting: Emergency Medicine

## 2020-06-24 ENCOUNTER — Other Ambulatory Visit: Payer: Self-pay

## 2020-06-24 DIAGNOSIS — F331 Major depressive disorder, recurrent, moderate: Secondary | ICD-10-CM | POA: Diagnosis not present

## 2020-06-24 DIAGNOSIS — N390 Urinary tract infection, site not specified: Secondary | ICD-10-CM | POA: Insufficient documentation

## 2020-06-24 LAB — POCT URINALYSIS DIP (MANUAL ENTRY)
Bilirubin, UA: NEGATIVE
Glucose, UA: NEGATIVE mg/dL
Ketones, POC UA: NEGATIVE mg/dL
Nitrite, UA: NEGATIVE
Protein Ur, POC: 30 mg/dL — AB
Spec Grav, UA: 1.025 (ref 1.010–1.025)
Urobilinogen, UA: 0.2 E.U./dL
pH, UA: 7 (ref 5.0–8.0)

## 2020-06-24 MED ORDER — CEPHALEXIN 500 MG PO CAPS: 500.0000 mg | ORAL_CAPSULE | Freq: Two times a day (BID) | ORAL | 0 refills | Status: AC

## 2020-06-24 NOTE — Discharge Instructions (Addendum)
Take the antibiotic as directed.  The urine culture is pending.  We will call you if it shows the need to change or discontinue your antibiotic.    Follow up with your primary care provider if your symptoms are not improving.    

## 2020-06-24 NOTE — ED Triage Notes (Signed)
Pt presents with urinary frequency. Pt was [facility] yesterday. Reports urinary frequency that started last night and suprapubic pressure.

## 2020-06-24 NOTE — ED Provider Notes (Signed)
[facility]    CSN: [id number] Arrival date & time: 06/24/20  0809      History   Chief Complaint Chief Complaint  Patient presents with  . Urinary Frequency    HPI Yvonne [name] is a 35 y.o. female.   Patient presents with suprapubic pressure, dysuria, and urinary frequency x1 day.  She denies fever, chills, abdominal pain, vaginal discharge, pelvic pain, vomiting, diarrhea, or other symptoms.  No treatments attempted at home.  Her medical history includes chronic back pain, migraine headaches, asthma, depression, anxiety, obesity.  The history is provided by the patient and medical records.    Past Medical History:  Diagnosis Date  . Anemia   . Anxiety   . 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 Asthma    albuterol inhaler  . Blood transfusion    2008 with c/s 4 units transfused  . Carpal tunnel syndrome on right   . Chronic back pain   . Depression   . Dyspnea    exertion  . Headache(784.0)    otc ibuprofen 800mg   . Heart murmur    as a child  . Obesity   . Pneumonia    as a child  . Thyroid disease     Patient Active Problem List   Diagnosis Date Noted  . S/P lumbar spinal fusion 02/13/2017  . Herniated nucleus pulposus, lumbar 04/10/2016  . HNP (herniated nucleus pulposus), lumbar 11/05/2014    Class: Chronic  . S/P endometrial ablation - HTA 2012 06/03/2012  . Sterilization - h/o BTL 06/03/2012  . OBESITY 09/03/2007  . MIGRAINE HEADACHE 09/03/2007    Past Surgical History:  Procedure Laterality Date  . ABDOMINAL HYSTERECTOMY N/A 09/29/2012   Procedure: HYSTERECTOMY ABDOMINAL;  Surgeon: Purcell NailsAngela Y Roberts, MD;  Location: [facility];  Service: Gynecology;  Laterality: N/A;  . APPENDECTOMY    . BACK SURGERY    . BILATERAL SALPINGECTOMY Bilateral 09/29/2012   Procedure: BILATERAL SALPINGECTOMY;  Surgeon: Purcell NailsAngela Y Roberts, MD;  Location: [facility];  Service: Gynecology;  Laterality: Bilateral;  . CESAREAN SECTION     . CHOLECYSTECTOMY    . CYSTOSCOPY Bilateral 09/29/2012   Procedure: CYSTOSCOPY;  Surgeon: Purcell NailsAngela Y Roberts, MD;  Location: [facility];  Service: Gynecology;  Laterality: Bilateral;  . ENDOMETRIAL ABLATION    . EXAMINATION UNDER ANESTHESIA N/A 10/16/2012   Procedure: EXAM UNDER ANESTHESIA;  Surgeon: Purcell NailsAngela Y Roberts, MD;  Location: [facility];  Service: Gynecology;  Laterality: N/A;  Suturing Vaginal Cuff  . HAND SURGERY Right    carpal tunnel  . LAPAROSCOPIC ASSISTED VAGINAL HYSTERECTOMY N/A 09/29/2012   Procedure: LAPAROSCOPIC ASSISTED VAGINAL HYSTERECTOMY;  Surgeon: Purcell NailsAngela Y Roberts, MD;  Location: [facility];  Service: Gynecology;  Laterality: N/A;  . LAPAROSCOPIC HYSTERECTOMY Bilateral 09/29/2012   Procedure: HYSTERECTOMY TOTAL LAPAROSCOPIC;  Surgeon: Purcell NailsAngela Y Roberts, MD;  Location: [facility];  Service: Gynecology;  Laterality: Bilateral;  Total Laparoscopic Hysterectomy, Bilateral Salpingectomies, Cystoscopy poss. LAVH poss. TAH  . LAPAROSCOPY     x3  . LUMBAR LAMINECTOMY N/A 11/05/2014   Procedure: RIGHT L4-5 MICRODISCECTOMY ;  Surgeon: Kerrin ChampagneJames E Nitka, MD;  Location: [facility];  Service: Orthopedics;  Laterality: N/A;  . LUMBAR LAMINECTOMY/DECOMPRESSION MICRODISCECTOMY N/A 04/10/2016   Procedure: Right L4-5 Microdiscectomy;  Surgeon: Kerrin ChampagneJames E Nitka, MD;  Location: [facility];  Service: Orthopedics;  Laterality: N/A;  . THYROIDECTOMY    . THYROIDECTOMY    . TONSILLECTOMY  T & A  . TRANSFORAMINAL LUMBAR INTERBODY FUSION (TLIF) WITH PEDICLE SCREW FIXATION 1 LEVEL N/A 02/13/2017   Procedure: Transforaminal Lumbar Interbody Fusion - Lumbar four-five;  Surgeon: Tia Alert, MD;  Location: [facility] OR;  Service: Neurosurgery;  Laterality: N/A;  . TUBAL LIGATION      OB History    Gravida  4   Para  4   Term  4   Preterm      AB      Living  1     SAB      IAB      Ectopic      Multiple      Live Births  1            Home Medications    Prior to Admission medications   Medication Sig Start  Date End Date Taking? Authorizing Provider  cephALEXin (KEFLEX) 500 MG capsule Take 1 capsule (500 mg total) by mouth 2 (two) times daily for 5 days. 06/24/20 06/29/20 Yes Mickie Bail, NP  fluconazole (DIFLUCAN) 150 MG tablet Take 1 tablet (150 mg total) by mouth daily. 05/10/20   Rodriguez-Southworth, Nettie Elm, PA-C  HYDROcodone-acetaminophen (NORCO/VICODIN) 5-325 MG tablet Take 1 tablet by mouth every 6 (six) hours as needed for moderate pain. 05/14/19   Kerrin Champagne, MD  metroNIDAZOLE (FLAGYL) 500 MG tablet Take 1 tablet (500 mg total) by mouth 2 (two) times daily. 05/10/20   Rodriguez-Southworth, Nettie Elm, PA-C  QUEtiapine (SEROQUEL) 300 MG tablet Take 300 mg by mouth at bedtime.    [provider]  levothyroxine (SYNTHROID) 150 MCG tablet Take 150 mcg by mouth daily. 08/15/19 01/06/20  [provider]    Family History Family History  Problem Relation Age of Onset  . Diabetes Mother   . Diabetes Maternal Grandmother   . Stroke Father     Social History Social History   Tobacco Use  . Smoking status: Current Every Day Smoker    Packs/day: 1.00    Types: Cigarettes    Last attempt to quit: 10/17/2015    Years since quitting: 4.6  . Smokeless tobacco: Never Used  Vaping Use  . Vaping Use: Never used  Substance Use Topics  . Alcohol use: Yes    Alcohol/week: 1.0 standard drink    Types: 1 Glasses of wine per week    Comment: ocassionally  . Drug use: No     Allergies   Bee venom, Reglan [metoclopramide hcl], and Aspirin   Review of Systems Review of Systems  Constitutional: Negative for chills and fever.  HENT: Negative for ear pain and sore throat.   Eyes: Negative for pain and visual disturbance.  Respiratory: Negative for cough and shortness of breath.   Cardiovascular: Negative for chest pain and palpitations.  Gastrointestinal: Positive for abdominal pain. Negative for vomiting.  Genitourinary: Positive for dysuria and frequency. Negative for hematuria,  pelvic pain and vaginal discharge.  Musculoskeletal: Negative for arthralgias and back pain.  Skin: Negative for color change and rash.  Neurological: Negative for seizures and syncope.  All other systems reviewed and are negative.    Physical Exam Triage Vital Signs ED Triage Vitals  Enc Vitals Group     BP      Pulse      Resp      Temp      Temp src      SpO2      Weight      Height  Head Circumference      Peak Flow      Pain Score      Pain Loc      Pain Edu?      Excl. in GC?    No data found.  Updated Vital Signs BP 103/70   Pulse 84   Temp 98.2 F (36.8 C)   Resp 19   LMP 07/25/2012   SpO2 98%   Visual Acuity Right Eye Distance:   Left Eye Distance:   Bilateral Distance:    Right Eye Near:   Left Eye Near:    Bilateral Near:     Physical Exam Vitals and nursing note reviewed.  Constitutional:      General: She is not in acute distress.    Appearance: She is well-developed.  HENT:     Head: Normocephalic and atraumatic.     Mouth/Throat:     Mouth: Mucous membranes are moist.  Eyes:     Conjunctiva/sclera: Conjunctivae normal.  Cardiovascular:     Rate and Rhythm: Normal rate and regular rhythm.     Heart sounds: Normal heart sounds.  Pulmonary:     Effort: Pulmonary effort is normal. No respiratory distress.     Breath sounds: Normal breath sounds.  Abdominal:     Palpations: Abdomen is soft.     Tenderness: There is no abdominal tenderness. There is no right CVA tenderness, left CVA tenderness, guarding or rebound.  Musculoskeletal:     Cervical back: Neck supple.  Skin:    General: Skin is warm and dry.  Neurological:     General: No focal deficit present.     Mental Status: She is alert and oriented to person, place, and time.     Gait: Gait normal.  Psychiatric:        Mood and Affect: Mood normal.        Behavior: Behavior normal.      UC Treatments / Results  Labs (all labs ordered are listed, but only abnormal  results are displayed) Labs Reviewed  POCT URINALYSIS DIP (MANUAL ENTRY) - Abnormal; Notable for the following components:      Result Value   Clarity, UA cloudy (*)    Blood, UA large (*)    Protein Ur, POC =30 (*)    Leukocytes, UA Small (1+) (*)    All other components within normal limits  URINE CULTURE    EKG   Radiology No results found.  Procedures Procedures (including critical care time)  Medications Ordered in UC Medications - No data to display  Initial Impression / Assessment and Plan / UC Course  I have reviewed the triage vital signs and the nursing notes.  Pertinent labs & imaging results that were available during my care of the patient were reviewed by me and considered in my medical decision making (see chart for details).   UTI.  Urine culture pending.  Treating with Keflex x5 days.  Discussed with patient that we will call her if the culture shows the need to change or discontinue the antibiotic.  Instructed her to follow-up with her PCP if her symptoms are not improving.  She agrees to plan of care.   Final Clinical Impressions(s) / UC Diagnoses   Final diagnoses:  Urinary tract infection without hematuria, site unspecified     Discharge Instructions     Take the antibiotic as directed.  The urine culture is pending.  We will call you if it shows the need to  change or discontinue your antibiotic.    Follow up with your primary care provider if your symptoms are not improving.        ED Prescriptions    Medication Sig Dispense Auth. Provider   cephALEXin (KEFLEX) 500 MG capsule Take 1 capsule (500 mg total) by mouth 2 (two) times daily for 5 days. 10 capsule Mickie Bail, NP     I have reviewed the PDMP during this encounter.   Mickie Bail, NP 06/24/20 [phone]

## 2020-06-27 LAB — URINE CULTURE: Culture: 20000 — AB

## 2020-06-28 DIAGNOSIS — Z79899 Other long term (current) drug therapy: Secondary | ICD-10-CM | POA: Diagnosis not present

## 2020-06-28 DIAGNOSIS — M4327 Fusion of spine, lumbosacral region: Secondary | ICD-10-CM | POA: Diagnosis not present

## 2020-06-28 DIAGNOSIS — G894 Chronic pain syndrome: Secondary | ICD-10-CM | POA: Diagnosis not present

## 2020-06-28 DIAGNOSIS — Z87891 Personal history of nicotine dependence: Secondary | ICD-10-CM | POA: Diagnosis not present

## 2020-06-28 DIAGNOSIS — F17210 Nicotine dependence, cigarettes, uncomplicated: Secondary | ICD-10-CM | POA: Diagnosis not present

## 2020-06-28 DIAGNOSIS — Z7185 Encounter for immunization safety counseling: Secondary | ICD-10-CM | POA: Diagnosis not present

## 2020-07-01 DIAGNOSIS — Z419 Encounter for procedure for purposes other than remedying health state, unspecified: Secondary | ICD-10-CM | POA: Diagnosis not present

## 2020-07-01 DIAGNOSIS — F331 Major depressive disorder, recurrent, moderate: Secondary | ICD-10-CM | POA: Diagnosis not present

## 2020-07-08 DIAGNOSIS — F331 Major depressive disorder, recurrent, moderate: Secondary | ICD-10-CM | POA: Diagnosis not present

## 2020-07-11 DIAGNOSIS — G43009 Migraine without aura, not intractable, without status migrainosus: Secondary | ICD-10-CM | POA: Diagnosis not present

## 2020-07-15 DIAGNOSIS — F331 Major depressive disorder, recurrent, moderate: Secondary | ICD-10-CM | POA: Diagnosis not present

## 2020-07-18 DIAGNOSIS — F331 Major depressive disorder, recurrent, moderate: Secondary | ICD-10-CM | POA: Diagnosis not present

## 2020-07-22 DIAGNOSIS — F331 Major depressive disorder, recurrent, moderate: Secondary | ICD-10-CM | POA: Diagnosis not present

## 2020-07-24 DIAGNOSIS — N7689 Other specified inflammation of vagina and vulva: Secondary | ICD-10-CM | POA: Diagnosis not present

## 2020-07-25 DIAGNOSIS — F331 Major depressive disorder, recurrent, moderate: Secondary | ICD-10-CM | POA: Diagnosis not present

## 2020-07-26 ENCOUNTER — Other Ambulatory Visit: Payer: Self-pay

## 2020-07-26 ENCOUNTER — Ambulatory Visit (HOSPITAL_COMMUNITY)
Admission: EM | Admit: 2020-07-26 | Discharge: 2020-07-26 | Disposition: A | Discharge status: Home / Self Care | Payer: Medicaid Other

## 2020-07-26 DIAGNOSIS — M79675 Pain in left toe(s): Secondary | ICD-10-CM

## 2020-07-26 NOTE — ED Provider Notes (Signed)
[facility]    CSN: [id number] Arrival date & time: 07/26/20  1041      History   Chief Complaint Chief Complaint  Patient presents with  . Toe Injury    HPI Yvonne [name] is a 35 y.o. female.   Patient presents with overall overall right great toe pain that started 4 days ago after wearing a new nonslip grip sneakers for work for 2 days prior. Endorses shoe size is a half size greater than normal. Can feel shoe moving around and rubbing against foot. Changed shoes to crocs which provided some relief but was told she cannot wear those to work. Therefore she came for evaluation today. ROM intact. Able to bear weight on to foot.  Denies numbness, tingling.   Past Medical History:  Diagnosis Date  . Anemia   . Anxiety   . 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 Asthma    albuterol inhaler  . Blood transfusion    2008 with c/s 4 units transfused  . Carpal tunnel syndrome on right   . Chronic back pain   . Depression   . Dyspnea    exertion  . Headache(784.0)    otc ibuprofen 800mg   . Heart murmur    as a child  . Obesity   . Pneumonia    as a child  . Thyroid disease     Patient Active Problem List   Diagnosis Date Noted  . S/P lumbar spinal fusion 02/13/2017  . Herniated nucleus pulposus, lumbar 04/10/2016  . HNP (herniated nucleus pulposus), lumbar 11/05/2014    Class: Chronic  . S/P endometrial ablation - HTA 2012 06/03/2012  . Sterilization - h/o BTL 06/03/2012  . OBESITY 09/03/2007  . MIGRAINE HEADACHE 09/03/2007    Past Surgical History:  Procedure Laterality Date  . ABDOMINAL HYSTERECTOMY N/A 09/29/2012   Procedure: HYSTERECTOMY ABDOMINAL;  Surgeon: 10/01/2012, MD;  Location: [facility];  Service: Gynecology;  Laterality: N/A;  . APPENDECTOMY    . BACK SURGERY    . BILATERAL SALPINGECTOMY Bilateral 09/29/2012   Procedure: BILATERAL SALPINGECTOMY;  Surgeon: 10/01/2012, MD;  Location: [facility];  Service:  Gynecology;  Laterality: Bilateral;  . CESAREAN SECTION    . CHOLECYSTECTOMY    . CYSTOSCOPY Bilateral 09/29/2012   Procedure: CYSTOSCOPY;  Surgeon: 10/01/2012, MD;  Location: [facility];  Service: Gynecology;  Laterality: Bilateral;  . ENDOMETRIAL ABLATION    . EXAMINATION UNDER ANESTHESIA N/A 10/16/2012   Procedure: EXAM UNDER ANESTHESIA;  Surgeon: 10/18/2012, MD;  Location: [facility];  Service: Gynecology;  Laterality: N/A;  Suturing Vaginal Cuff  . HAND SURGERY Right    carpal tunnel  . LAPAROSCOPIC ASSISTED VAGINAL HYSTERECTOMY N/A 09/29/2012   Procedure: LAPAROSCOPIC ASSISTED VAGINAL HYSTERECTOMY;  Surgeon: 10/01/2012, MD;  Location: [facility];  Service: Gynecology;  Laterality: N/A;  . LAPAROSCOPIC HYSTERECTOMY Bilateral 09/29/2012   Procedure: HYSTERECTOMY TOTAL LAPAROSCOPIC;  Surgeon: 10/01/2012, MD;  Location: [facility];  Service: Gynecology;  Laterality: Bilateral;  Total Laparoscopic Hysterectomy, Bilateral Salpingectomies, Cystoscopy poss. LAVH poss. TAH  . LAPAROSCOPY     x3  . LUMBAR LAMINECTOMY N/A 11/05/2014   Procedure: RIGHT L4-5 MICRODISCECTOMY ;  Surgeon: 01/05/2015, MD;  Location: [facility];  Service: Orthopedics;  Laterality: N/A;  . LUMBAR LAMINECTOMY/DECOMPRESSION MICRODISCECTOMY N/A 04/10/2016   Procedure: Right L4-5 Microdiscectomy;  Surgeon: 06/08/2016, MD;  Location: [facility];  Service: Orthopedics;  Laterality: N/A;  . THYROIDECTOMY    . THYROIDECTOMY    . TONSILLECTOMY     T & A  . TRANSFORAMINAL LUMBAR INTERBODY FUSION (TLIF) WITH PEDICLE SCREW FIXATION 1 LEVEL N/A 02/13/2017   Procedure: Transforaminal Lumbar Interbody Fusion - Lumbar four-five;  Surgeon: Tia Alert, MD;  Location: [facility] OR;  Service: Neurosurgery;  Laterality: N/A;  . TUBAL LIGATION      OB History    Gravida  4   Para  4   Term  4   Preterm      AB      Living  1     SAB      IAB      Ectopic      Multiple      Live Births  1            Home Medications     Prior to Admission medications   Medication Sig Start Date End Date Taking? Authorizing Provider  fluconazole (DIFLUCAN) 150 MG tablet Take 1 tablet (150 mg total) by mouth daily. 05/10/20   Rodriguez-Southworth, Nettie Elm, PA-C  HYDROcodone-acetaminophen (NORCO/VICODIN) 5-325 MG tablet Take 1 tablet by mouth every 6 (six) hours as needed for moderate pain. 05/14/19   Kerrin Champagne, MD  metroNIDAZOLE (FLAGYL) 500 MG tablet Take 1 tablet (500 mg total) by mouth 2 (two) times daily. 05/10/20   Rodriguez-Southworth, Nettie Elm, PA-C  QUEtiapine (SEROQUEL) 300 MG tablet Take 300 mg by mouth at bedtime.    [provider]  levothyroxine (SYNTHROID) 150 MCG tablet Take 150 mcg by mouth daily. 08/15/19 01/06/20  [provider]    Family History Family History  Problem Relation Age of Onset  . Diabetes Mother   . Diabetes Maternal Grandmother   . Stroke Father     Social History Social History   Tobacco Use  . Smoking status: Current Every Day Smoker    Packs/day: 1.00    Types: Cigarettes    Last attempt to quit: 10/17/2015    Years since quitting: 4.7  . Smokeless tobacco: Never Used  Vaping Use  . Vaping Use: Never used  Substance Use Topics  . Alcohol use: Yes    Alcohol/week: 1.0 standard drink    Types: 1 Glasses of wine per week    Comment: ocassionally  . Drug use: No     Allergies   Bee venom, Reglan [metoclopramide hcl], and Aspirin   Review of Systems Review of Systems  Constitutional: Negative.   Respiratory: Negative.   Cardiovascular: Negative.   Genitourinary: Negative.   Musculoskeletal: Negative.   Skin: Negative.   Neurological: Negative.      Physical Exam Triage Vital Signs ED Triage Vitals [07/26/20 1129]  Enc Vitals Group     BP 110/60     Pulse Rate 72     Resp 18     Temp 99.1 F (37.3 C)     Temp Source Oral     SpO2 99 %     Weight      Height      Head Circumference      Peak Flow      Pain Score 8     Pain Loc       Pain Edu?      Excl. in GC?    No data found.  Updated Vital Signs BP 110/60 (BP Location: Right Arm)   Pulse 72   Temp 99.1  F (37.3 C) (Oral)   Resp 18   LMP 07/25/2012   SpO2 99%   Visual Acuity Right Eye Distance:   Left Eye Distance:   Bilateral Distance:    Right Eye Near:   Left Eye Near:    Bilateral Near:     Physical Exam Constitutional:      Appearance: Normal appearance. She is normal weight.  HENT:     Head: Normocephalic.  Eyes:     Extraocular Movements: Extraocular movements intact.  Pulmonary:     Effort: Pulmonary effort is normal.  Musculoskeletal:     Cervical back: Normal range of motion.     Comments: Tenderness over lateral right great toe, no erythema or swelling, skin is intact, ROM intact   Skin:    General: Skin is warm and dry.  Neurological:     Mental Status: She is alert and oriented to person, place, and time. Mental status is at baseline.  Psychiatric:        Mood and Affect: Mood normal.        Behavior: Behavior normal.        Thought Content: Thought content normal.        Judgment: Judgment normal.      UC Treatments / Results  Labs (all labs ordered are listed, but only abnormal results are displayed) Labs Reviewed - No data to display  EKG   Radiology No results found.  Procedures Procedures (including critical care time)  Medications Ordered in UC Medications - No data to display  Initial Impression / Assessment and Plan / UC Course  I have reviewed the triage vital signs and the nursing notes.  Pertinent labs & imaging results that were available during my care of the patient were reviewed by me and considered in my medical decision making (see chart for details).  Right great toe pain   Injury most likely caused by external factors therefore, discussed finding supportive pads or covering, wearing thicker socks and changing shoes to provide relief.  Discussed using over-the-counter pain management however  patient on narcotics for chronic back pain and does not feel those medications would be helpful. Final Clinical Impressions(s) / UC Diagnoses   Final diagnoses:  Great toe pain, left     Discharge Instructions     Can attempt wearing thicker socks with shoes   Wearing double layers of socks to fill space  Covering toe with padding or adding padding over area in shoes where it will touch foot     ED Prescriptions    None     PDMP not reviewed this encounter.   Valinda Hoar, NP 07/26/20 1309

## 2020-07-26 NOTE — ED Triage Notes (Signed)
Pt presents with right great toe pain non injury related X 4 days.

## 2020-07-26 NOTE — Discharge Instructions (Signed)
Can attempt wearing thicker socks with shoes   Wearing double layers of socks to fill space  Covering toe with padding or adding padding over area in shoes where it will touch foot

## 2020-07-30 DIAGNOSIS — F331 Major depressive disorder, recurrent, moderate: Secondary | ICD-10-CM | POA: Diagnosis not present

## 2020-07-30 DIAGNOSIS — Z79899 Other long term (current) drug therapy: Secondary | ICD-10-CM | POA: Diagnosis not present

## 2020-07-30 DIAGNOSIS — E559 Vitamin D deficiency, unspecified: Secondary | ICD-10-CM | POA: Diagnosis not present

## 2020-07-30 DIAGNOSIS — M4327 Fusion of spine, lumbosacral region: Secondary | ICD-10-CM | POA: Diagnosis not present

## 2020-07-30 DIAGNOSIS — M129 Arthropathy, unspecified: Secondary | ICD-10-CM | POA: Diagnosis not present

## 2020-07-30 DIAGNOSIS — Z1159 Encounter for screening for other viral diseases: Secondary | ICD-10-CM | POA: Diagnosis not present

## 2020-07-30 DIAGNOSIS — F17210 Nicotine dependence, cigarettes, uncomplicated: Secondary | ICD-10-CM | POA: Diagnosis not present

## 2020-07-30 DIAGNOSIS — G894 Chronic pain syndrome: Secondary | ICD-10-CM | POA: Diagnosis not present

## 2020-07-31 DIAGNOSIS — Z419 Encounter for procedure for purposes other than remedying health state, unspecified: Secondary | ICD-10-CM | POA: Diagnosis not present

## 2020-08-01 DIAGNOSIS — F331 Major depressive disorder, recurrent, moderate: Secondary | ICD-10-CM | POA: Diagnosis not present

## 2020-08-06 DIAGNOSIS — F331 Major depressive disorder, recurrent, moderate: Secondary | ICD-10-CM | POA: Diagnosis not present

## 2020-08-08 DIAGNOSIS — N760 Acute vaginitis: Secondary | ICD-10-CM | POA: Diagnosis not present

## 2020-08-08 DIAGNOSIS — E890 Postprocedural hypothyroidism: Secondary | ICD-10-CM | POA: Diagnosis not present

## 2020-08-08 DIAGNOSIS — F331 Major depressive disorder, recurrent, moderate: Secondary | ICD-10-CM | POA: Diagnosis not present

## 2020-08-08 DIAGNOSIS — B9689 Other specified bacterial agents as the cause of diseases classified elsewhere: Secondary | ICD-10-CM | POA: Diagnosis not present

## 2020-08-09 ENCOUNTER — Encounter (HOSPITAL_COMMUNITY): Payer: Self-pay

## 2020-08-09 ENCOUNTER — Ambulatory Visit (HOSPITAL_COMMUNITY)
Admission: EM | Admit: 2020-08-09 | Discharge: 2020-08-09 | Disposition: A | Discharge status: Home / Self Care | Payer: Medicaid Other | Attending: Emergency Medicine | Admitting: Emergency Medicine

## 2020-08-09 ENCOUNTER — Other Ambulatory Visit: Payer: Self-pay

## 2020-08-09 DIAGNOSIS — K2900 Acute gastritis without bleeding: Secondary | ICD-10-CM

## 2020-08-09 LAB — POCT URINALYSIS DIPSTICK, ED / UC
Glucose, UA: NEGATIVE mg/dL
Ketones, ur: NEGATIVE mg/dL
Leukocytes,Ua: NEGATIVE
Nitrite: NEGATIVE
Protein, ur: 30 mg/dL — AB
Specific Gravity, Urine: 1.025 (ref 1.005–1.030)
Urobilinogen, UA: 1 mg/dL (ref 0.0–1.0)
pH: 6.5 (ref 5.0–8.0)

## 2020-08-09 MED ORDER — ONDANSETRON HCL 4 MG PO TABS: 4.0000 mg | ORAL_TABLET | Freq: Four times a day (QID) | ORAL | 0 refills | Status: DC

## 2020-08-09 MED ORDER — FAMOTIDINE 20 MG PO TABS: 20.0000 mg | ORAL_TABLET | Freq: Two times a day (BID) | ORAL | 0 refills | Status: DC

## 2020-08-09 MED ORDER — ALUM & MAG HYDROXIDE-SIMETH 200-200-20 MG/5ML PO SUSP
ORAL | Status: AC
  Filled 2020-08-09: qty 30

## 2020-08-09 MED ORDER — LIDOCAINE VISCOUS HCL 2 % MT SOLN
15.0000 mL | Freq: Once | OROMUCOSAL | Status: AC
  Administered 2020-08-09: 15 mL via ORAL

## 2020-08-09 MED ORDER — ALUMINUM & MAGNESIUM HYDROXIDE 200-200 MG/5ML PO SUSP: 15.0000 mL | Freq: Four times a day (QID) | ORAL | 0 refills | Status: DC | PRN

## 2020-08-09 MED ORDER — ALUM & MAG HYDROXIDE-SIMETH 200-200-20 MG/5ML PO SUSP
30.0000 mL | Freq: Once | ORAL | Status: AC
  Administered 2020-08-09: 30 mL via ORAL

## 2020-08-09 MED ORDER — LIDOCAINE VISCOUS HCL 2 % MT SOLN
OROMUCOSAL | Status: AC
  Filled 2020-08-09: qty 15

## 2020-08-09 MED ORDER — DICYCLOMINE HCL 10 MG/5ML PO SOLN: 10.0000 mg | Freq: Once | ORAL | Status: DC

## 2020-08-09 NOTE — ED Triage Notes (Signed)
Pt presents with generalized abdominal pain, back pain, and nausea since last night.

## 2020-08-09 NOTE — Discharge Instructions (Addendum)
Take zofran every 6 hours as needed for nausea   Once nausea decreases, attempt to rehydrate, start with water and gatorade or similar product, then add bland foods, if you tolerate this then can eat normal diet   Can use 15 mL of solution every 6 hours as needed for pain   Take pepcid twice a day for the next 5 days

## 2020-08-09 NOTE — ED Provider Notes (Signed)
[facility]    CSN: [id number] Arrival date & time: 08/09/20  1021      History   Chief Complaint Chief Complaint  Patient presents with  . Abdominal Pain  . Back Pain  . Nausea    HPI Yvonne [name] is a 35 y.o. female.   Patient presents with mid abdominal pain 7/10 and nausea beginning last night. Has been constant described as punching feeling. Endorses mid back pain but has chronic pain, followed by pain clinic. Took Vicodin last night which only relieved back pain. Denies vomiting, diarrhea, constipation, fever, chills, shortness of breath, chest pain, urinary frequency, discharge, itching, irritation.  Past Medical History:  Diagnosis Date  . Anemia   . Anxiety   . 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 Asthma    albuterol inhaler  . Blood transfusion    2008 with c/s 4 units transfused  . Carpal tunnel syndrome on right   . Chronic back pain   . Depression   . Dyspnea    exertion  . Headache(784.0)    otc ibuprofen 800mg   . Heart murmur    as a child  . Obesity   . Pneumonia    as a child  . Thyroid disease     Patient Active Problem List   Diagnosis Date Noted  . S/P lumbar spinal fusion 02/13/2017  . Herniated nucleus pulposus, lumbar 04/10/2016  . HNP (herniated nucleus pulposus), lumbar 11/05/2014    Class: Chronic  . S/P endometrial ablation - HTA 2012 06/03/2012  . Sterilization - h/o BTL 06/03/2012  . OBESITY 09/03/2007  . MIGRAINE HEADACHE 09/03/2007    Past Surgical History:  Procedure Laterality Date  . ABDOMINAL HYSTERECTOMY N/A 09/29/2012   Procedure: HYSTERECTOMY ABDOMINAL;  Surgeon: 10/01/2012, MD;  Location: [facility];  Service: Gynecology;  Laterality: N/A;  . APPENDECTOMY    . BACK SURGERY    . BILATERAL SALPINGECTOMY Bilateral 09/29/2012   Procedure: BILATERAL SALPINGECTOMY;  Surgeon: 10/01/2012, MD;  Location: [facility];  Service: Gynecology;  Laterality: Bilateral;  .  CESAREAN SECTION    . CHOLECYSTECTOMY    . CYSTOSCOPY Bilateral 09/29/2012   Procedure: CYSTOSCOPY;  Surgeon: 10/01/2012, MD;  Location: [facility];  Service: Gynecology;  Laterality: Bilateral;  . ENDOMETRIAL ABLATION    . EXAMINATION UNDER ANESTHESIA N/A 10/16/2012   Procedure: EXAM UNDER ANESTHESIA;  Surgeon: 10/18/2012, MD;  Location: [facility];  Service: Gynecology;  Laterality: N/A;  Suturing Vaginal Cuff  . HAND SURGERY Right    carpal tunnel  . LAPAROSCOPIC ASSISTED VAGINAL HYSTERECTOMY N/A 09/29/2012   Procedure: LAPAROSCOPIC ASSISTED VAGINAL HYSTERECTOMY;  Surgeon: 10/01/2012, MD;  Location: [facility];  Service: Gynecology;  Laterality: N/A;  . LAPAROSCOPIC HYSTERECTOMY Bilateral 09/29/2012   Procedure: HYSTERECTOMY TOTAL LAPAROSCOPIC;  Surgeon: 10/01/2012, MD;  Location: [facility];  Service: Gynecology;  Laterality: Bilateral;  Total Laparoscopic Hysterectomy, Bilateral Salpingectomies, Cystoscopy poss. LAVH poss. TAH  . LAPAROSCOPY     x3  . LUMBAR LAMINECTOMY N/A 11/05/2014   Procedure: RIGHT L4-5 MICRODISCECTOMY ;  Surgeon: 01/05/2015, MD;  Location: [facility];  Service: Orthopedics;  Laterality: N/A;  . LUMBAR LAMINECTOMY/DECOMPRESSION MICRODISCECTOMY N/A 04/10/2016   Procedure: Right L4-5 Microdiscectomy;  Surgeon: 06/08/2016, MD;  Location: [facility];  Service: Orthopedics;  Laterality: N/A;  . THYROIDECTOMY    . THYROIDECTOMY    .  TONSILLECTOMY     T & A  . TRANSFORAMINAL LUMBAR INTERBODY FUSION (TLIF) WITH PEDICLE SCREW FIXATION 1 LEVEL N/A 02/13/2017   Procedure: Transforaminal Lumbar Interbody Fusion - Lumbar four-five;  Surgeon: Tia Alert, MD;  Location: [facility];  Service: Neurosurgery;  Laterality: N/A;  . TUBAL LIGATION      OB History    Gravida  4   Para  4   Term  4   Preterm      AB      Living  1     SAB      IAB      Ectopic      Multiple      Live Births  1            Home Medications    Prior to Admission medications    Medication Sig Start Date End Date Taking? Authorizing Provider  fluconazole (DIFLUCAN) 150 MG tablet Take 1 tablet (150 mg total) by mouth daily. 05/10/20   Rodriguez-Southworth, Nettie Elm, PA-C  HYDROcodone-acetaminophen (NORCO/VICODIN) 5-325 MG tablet Take 1 tablet by mouth every 6 (six) hours as needed for moderate pain. 05/14/19   Kerrin Champagne, MD  metroNIDAZOLE (FLAGYL) 500 MG tablet Take 1 tablet (500 mg total) by mouth 2 (two) times daily. 05/10/20   Rodriguez-Southworth, Nettie Elm, PA-C  QUEtiapine (SEROQUEL) 300 MG tablet Take 300 mg by mouth at bedtime.    [provider]  levothyroxine (SYNTHROID) 150 MCG tablet Take 150 mcg by mouth daily. 08/15/19 01/06/20  [provider]    Family History Family History  Problem Relation Age of Onset  . Diabetes Mother   . Diabetes Maternal Grandmother   . Stroke Father     Social History Social History   Tobacco Use  . Smoking status: Current Every Day Smoker    Packs/day: 1.00    Types: Cigarettes    Last attempt to quit: 10/17/2015    Years since quitting: 4.8  . Smokeless tobacco: Never Used  Vaping Use  . Vaping Use: Never used  Substance Use Topics  . Alcohol use: Yes    Alcohol/week: 1.0 standard drink    Types: 1 Glasses of wine per week    Comment: ocassionally  . Drug use: No     Allergies   Bee venom, Reglan [metoclopramide hcl], and Aspirin   Review of Systems Review of Systems  Defer to HPI    Physical Exam Triage Vital Signs ED Triage Vitals  Enc Vitals Group     BP 08/09/20 1141 113/76     Pulse Rate 08/09/20 1141 83     Resp 08/09/20 1141 17     Temp 08/09/20 1141 98.8 F (37.1 C)     Temp Source 08/09/20 1141 Oral     SpO2 08/09/20 1141 96 %     Weight --      Height --      Head Circumference --      Peak Flow --      Pain Score 08/09/20 1139 7     Pain Loc --      Pain Edu? --      Excl. in GC? --    No data found.  Updated Vital Signs BP 113/76 (BP Location: Right Arm)    Pulse 83   Temp 98.8 F (37.1 C) (Oral)   Resp 17   LMP 07/25/2012   SpO2 96%   Visual Acuity Right Eye Distance:   Left Eye  Distance:   Bilateral Distance:    Right Eye Near:   Left Eye Near:    Bilateral Near:     Physical Exam Constitutional:      Appearance: She is well-developed. She is obese.  HENT:     Head: Normocephalic.  Eyes:     Extraocular Movements: Extraocular movements intact.  Pulmonary:     Effort: Pulmonary effort is normal.  Abdominal:     General: Abdomen is flat. Bowel sounds are normal.     Palpations: Abdomen is soft.     Tenderness: There is abdominal tenderness in the epigastric area and periumbilical area. There is no right CVA tenderness, left CVA tenderness, guarding or rebound.  Skin:    General: Skin is warm and dry.  Neurological:     General: No focal deficit present.     Mental Status: She is alert and oriented to person, place, and time.  Psychiatric:        Mood and Affect: Mood normal.        Behavior: Behavior normal.      UC Treatments / Results  Labs (all labs ordered are listed, but only abnormal results are displayed) Labs Reviewed  POCT URINALYSIS DIPSTICK, ED / UC - Abnormal; Notable for the following components:      Result Value   Bilirubin Urine SMALL (*)    Hgb urine dipstick LARGE (*)    Protein, ur 30 (*)    All other components within normal limits    EKG   Radiology No results found.  Procedures Procedures (including critical care time)  Medications Ordered in UC Medications  alum & mag hydroxide-simeth (MAALOX/MYLANTA) 200-200-20 MG/5ML suspension 30 mL (has no administration in time range)    And  lidocaine (XYLOCAINE) 2 % viscous mouth solution 15 mL (has no administration in time range)  dicyclomine (BENTYL) 10 MG/5ML solution 10 mg (has no administration in time range)    Initial Impression / Assessment and Plan / UC Course  I have reviewed the triage vital signs and the nursing  notes.  Pertinent labs & imaging results that were available during my care of the patient were reviewed by me and considered in my medical decision making (see chart for details).  Gastritis  1. Urinalysis, negative infection, indicating possible dehydration, Patient has not eaten and only had a caprisun since yesterday evening 2. Maalox 30 mL with lidocaine viscous 15 mL now, reevaluated after 10 minutes, abdominal pain decreasing 3. zofran 4 mg every 6 hours prn, advised oral rehydration once nausea decreases 4. Maalox 200-200 mG every 6 hours prn 5. Pepcid 20 mg bid Final Clinical Impressions(s) / UC Diagnoses   Final diagnoses:  None   Discharge Instructions   None    ED Prescriptions    None     PDMP not reviewed this encounter.   Valinda Hoar, NP 08/09/20 1506

## 2020-08-13 DIAGNOSIS — F331 Major depressive disorder, recurrent, moderate: Secondary | ICD-10-CM | POA: Diagnosis not present

## 2020-08-20 DIAGNOSIS — F331 Major depressive disorder, recurrent, moderate: Secondary | ICD-10-CM | POA: Diagnosis not present

## 2020-08-22 DIAGNOSIS — F331 Major depressive disorder, recurrent, moderate: Secondary | ICD-10-CM | POA: Diagnosis not present

## 2020-08-26 DIAGNOSIS — F411 Generalized anxiety disorder: Secondary | ICD-10-CM | POA: Diagnosis not present

## 2020-08-26 DIAGNOSIS — G4700 Insomnia, unspecified: Secondary | ICD-10-CM | POA: Diagnosis not present

## 2020-08-26 DIAGNOSIS — F330 Major depressive disorder, recurrent, mild: Secondary | ICD-10-CM | POA: Diagnosis not present

## 2020-08-27 DIAGNOSIS — Z79899 Other long term (current) drug therapy: Secondary | ICD-10-CM | POA: Diagnosis not present

## 2020-08-27 DIAGNOSIS — F331 Major depressive disorder, recurrent, moderate: Secondary | ICD-10-CM | POA: Diagnosis not present

## 2020-08-27 DIAGNOSIS — M4327 Fusion of spine, lumbosacral region: Secondary | ICD-10-CM | POA: Diagnosis not present

## 2020-08-27 DIAGNOSIS — G894 Chronic pain syndrome: Secondary | ICD-10-CM | POA: Diagnosis not present

## 2020-08-27 DIAGNOSIS — N898 Other specified noninflammatory disorders of vagina: Secondary | ICD-10-CM | POA: Diagnosis not present

## 2020-08-27 DIAGNOSIS — F17210 Nicotine dependence, cigarettes, uncomplicated: Secondary | ICD-10-CM | POA: Diagnosis not present

## 2020-08-29 DIAGNOSIS — F331 Major depressive disorder, recurrent, moderate: Secondary | ICD-10-CM | POA: Diagnosis not present

## 2020-08-30 DIAGNOSIS — Z7251 High risk heterosexual behavior: Secondary | ICD-10-CM | POA: Diagnosis not present

## 2020-08-31 DIAGNOSIS — R519 Headache, unspecified: Secondary | ICD-10-CM | POA: Diagnosis not present

## 2020-08-31 DIAGNOSIS — Z419 Encounter for procedure for purposes other than remedying health state, unspecified: Secondary | ICD-10-CM | POA: Diagnosis not present

## 2020-09-03 DIAGNOSIS — F331 Major depressive disorder, recurrent, moderate: Secondary | ICD-10-CM | POA: Diagnosis not present

## 2020-09-05 DIAGNOSIS — F331 Major depressive disorder, recurrent, moderate: Secondary | ICD-10-CM | POA: Diagnosis not present

## 2020-09-10 DIAGNOSIS — F331 Major depressive disorder, recurrent, moderate: Secondary | ICD-10-CM | POA: Diagnosis not present

## 2020-09-12 DIAGNOSIS — F331 Major depressive disorder, recurrent, moderate: Secondary | ICD-10-CM | POA: Diagnosis not present

## 2020-09-17 DIAGNOSIS — F331 Major depressive disorder, recurrent, moderate: Secondary | ICD-10-CM | POA: Diagnosis not present

## 2020-09-19 DIAGNOSIS — F331 Major depressive disorder, recurrent, moderate: Secondary | ICD-10-CM | POA: Diagnosis not present

## 2020-09-19 DIAGNOSIS — R519 Headache, unspecified: Secondary | ICD-10-CM | POA: Diagnosis not present

## 2020-09-24 DIAGNOSIS — F331 Major depressive disorder, recurrent, moderate: Secondary | ICD-10-CM | POA: Diagnosis not present

## 2020-09-25 DIAGNOSIS — Z79899 Other long term (current) drug therapy: Secondary | ICD-10-CM | POA: Diagnosis not present

## 2020-09-25 DIAGNOSIS — F17210 Nicotine dependence, cigarettes, uncomplicated: Secondary | ICD-10-CM | POA: Diagnosis not present

## 2020-09-25 DIAGNOSIS — M4327 Fusion of spine, lumbosacral region: Secondary | ICD-10-CM | POA: Diagnosis not present

## 2020-09-25 DIAGNOSIS — G894 Chronic pain syndrome: Secondary | ICD-10-CM | POA: Diagnosis not present

## 2020-09-26 DIAGNOSIS — F331 Major depressive disorder, recurrent, moderate: Secondary | ICD-10-CM | POA: Diagnosis not present

## 2020-09-28 ENCOUNTER — Ambulatory Visit (HOSPITAL_COMMUNITY)
Admission: EM | Admit: 2020-09-28 | Discharge: 2020-09-28 | Disposition: A | Discharge status: Home / Self Care | Payer: Medicaid Other | Attending: Medical Oncology | Admitting: Medical Oncology

## 2020-09-28 ENCOUNTER — Other Ambulatory Visit: Payer: Self-pay

## 2020-09-28 ENCOUNTER — Encounter (HOSPITAL_COMMUNITY): Payer: Self-pay

## 2020-09-28 DIAGNOSIS — N898 Other specified noninflammatory disorders of vagina: Secondary | ICD-10-CM | POA: Insufficient documentation

## 2020-09-28 MED ORDER — METRONIDAZOLE 0.75 % VA GEL: 1.0000 | Freq: Every day | VAGINAL | 0 refills | Status: DC

## 2020-09-28 NOTE — ED Triage Notes (Signed)
Patient states she has had BV multiple times in the past, states that cream medication works best for her. States she has a light odor, No pain on urination, nor discharge. No new lower back pain.   Symptoms started about a week ago.   No interventions taken per patient.

## 2020-09-28 NOTE — ED Provider Notes (Signed)
[facility]    CSN: [id number] Arrival date & time: 09/28/20  1006      History   Chief Complaint Chief Complaint  Patient presents with   Possible BV   vaginal odor     HPI Yvonne [name] is a 35 y.o. female.   HPI  Vaginal discharge: Pt reports that for the past week she has had vaginal discharge and odor very similar to past bacterial vaginosis infections.  She has not tried anything for symptoms.  She denies any fevers, pelvic pain, dysuria or risk for pregnancy.  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 - tx w/otc med   Asthma    albuterol inhaler   Blood transfusion    2008 with c/s 4 units transfused   Carpal tunnel syndrome on right    Chronic back pain    Depression    Dyspnea    exertion   Headache(784.0)    otc ibuprofen 800mg    Heart murmur    as a child   Obesity    Pneumonia    as a child   Thyroid disease     Patient Active Problem List   Diagnosis Date Noted   S/P lumbar spinal fusion 02/13/2017   Herniated nucleus pulposus, lumbar 04/10/2016   HNP (herniated nucleus pulposus), lumbar 11/05/2014    Class: Chronic   S/P endometrial ablation - HTA 2012 06/03/2012   Sterilization - h/o BTL 06/03/2012   OBESITY 09/03/2007   MIGRAINE HEADACHE 09/03/2007    Past Surgical History:  Procedure Laterality Date   ABDOMINAL HYSTERECTOMY N/A 09/29/2012   Procedure: HYSTERECTOMY ABDOMINAL;  Surgeon: 10/01/2012, MD;  Location: [facility];  Service: Gynecology;  Laterality: N/A;   APPENDECTOMY     BACK SURGERY     BILATERAL SALPINGECTOMY Bilateral 09/29/2012   Procedure: BILATERAL SALPINGECTOMY;  Surgeon: 10/01/2012, MD;  Location: [facility];  Service: Gynecology;  Laterality: Bilateral;   CESAREAN SECTION     CHOLECYSTECTOMY     CYSTOSCOPY Bilateral 09/29/2012   Procedure: CYSTOSCOPY;  Surgeon: 10/01/2012, MD;  Location: [facility];  Service: Gynecology;   Laterality: Bilateral;   ENDOMETRIAL ABLATION     EXAMINATION UNDER ANESTHESIA N/A 10/16/2012   Procedure: EXAM UNDER ANESTHESIA;  Surgeon: 10/18/2012, MD;  Location: [facility];  Service: Gynecology;  Laterality: N/A;  Suturing Vaginal Cuff   HAND SURGERY Right    carpal tunnel   LAPAROSCOPIC ASSISTED VAGINAL HYSTERECTOMY N/A 09/29/2012   Procedure: LAPAROSCOPIC ASSISTED VAGINAL HYSTERECTOMY;  Surgeon: 10/01/2012, MD;  Location: [facility];  Service: Gynecology;  Laterality: N/A;   LAPAROSCOPIC HYSTERECTOMY Bilateral 09/29/2012   Procedure: HYSTERECTOMY TOTAL LAPAROSCOPIC;  Surgeon: 10/01/2012, MD;  Location: [facility];  Service: Gynecology;  Laterality: Bilateral;  Total Laparoscopic Hysterectomy, Bilateral Salpingectomies, Cystoscopy poss. LAVH poss. TAH   LAPAROSCOPY     x3   LUMBAR LAMINECTOMY N/A 11/05/2014   Procedure: RIGHT L4-5 MICRODISCECTOMY ;  Surgeon: 01/05/2015, MD;  Location: [facility];  Service: Orthopedics;  Laterality: N/A;   LUMBAR LAMINECTOMY/DECOMPRESSION MICRODISCECTOMY N/A 04/10/2016   Procedure: Right L4-5 Microdiscectomy;  Surgeon: 06/08/2016, MD;  Location: [facility];  Service: Orthopedics;  Laterality: N/A;   THYROIDECTOMY     THYROIDECTOMY     TONSILLECTOMY     T & A   TRANSFORAMINAL LUMBAR  INTERBODY FUSION (TLIF) WITH PEDICLE SCREW FIXATION 1 LEVEL N/A 02/13/2017   Procedure: Transforaminal Lumbar Interbody Fusion - Lumbar four-five;  Surgeon: Tia Alert, MD;  Location: [facility] OR;  Service: Neurosurgery;  Laterality: N/A;   TUBAL LIGATION      OB History     Gravida  4   Para  4   Term  4   Preterm      AB      Living  1      SAB      IAB      Ectopic      Multiple      Live Births  1            Home Medications    Prior to Admission medications   Medication Sig Start Date End Date Taking? Authorizing Provider  levothyroxine (SYNTHROID) 150 MCG tablet Take by mouth. 08/15/19  Yes [provider]  metroNIDAZOLE (METROGEL  VAGINAL) 0.75 % vaginal gel Place 1 Applicatorful vaginally at bedtime. 09/28/20  Yes Ceria Suminski M, PA-C  oxyCODONE-acetaminophen (PERCOCET/ROXICET) 5-325 MG tablet Take by mouth. 06/18/19  Yes [provider]  aluminum-magnesium hydroxide 200-200 MG/5ML suspension Take 15 mLs by mouth every 6 (six) hours as needed for indigestion. 08/09/20   White, Elita Boone, NP  famotidine (PEPCID) 20 MG tablet Take 1 tablet (20 mg total) by mouth 2 (two) times daily. 08/09/20   White, Elita Boone, NP  HYDROcodone-acetaminophen (NORCO/VICODIN) 5-325 MG tablet Take 1 tablet by mouth every 6 (six) hours as needed for moderate pain. 05/14/19   Kerrin Champagne, MD  ondansetron (ZOFRAN) 4 MG tablet Take 1 tablet (4 mg total) by mouth every 6 (six) hours. 08/09/20   White, Elita Boone, NP  QUEtiapine (SEROQUEL) 300 MG tablet Take 300 mg by mouth at bedtime.    [provider]    Family History Family History  Problem Relation Age of Onset   Diabetes Mother    Diabetes Maternal Grandmother    Stroke Father     Social History Social History   Tobacco Use   Smoking status: Every Day    Packs/day: 1.00    Pack years: 0.00    Types: Cigarettes    Last attempt to quit: 10/17/2015    Years since quitting: 4.9   Smokeless tobacco: Never  Vaping Use   Vaping Use: Some days  Substance Use Topics   Alcohol use: Yes    Alcohol/week: 1.0 standard drink    Types: 1 Glasses of wine per week    Comment: ocassionally   Drug use: No     Allergies   Aspirin, Bee venom, Metoclopramide, Reglan [metoclopramide hcl], and Metronidazole   Review of Systems Review of Systems  As stated above in HPI Physical Exam Triage Vital Signs ED Triage Vitals  Enc Vitals Group     BP 09/28/20 1016 113/69     Pulse Rate 09/28/20 1016 72     Resp 09/28/20 1016 20     Temp 09/28/20 1016 98.9 F (37.2 C)     Temp src --      SpO2 09/28/20 1016 98 %     Weight --      Height --      Head Circumference --       Peak Flow --      Pain Score 09/28/20 1021 1     Pain Loc --      Pain Edu? --  Excl. in GC? --    No data found.  Updated Vital Signs BP 113/69 (BP Location: Right Arm)   Pulse 72   Temp 98.9 F (37.2 C)   Resp 20   LMP 07/25/2012   SpO2 98%   Physical Exam Vitals and nursing note reviewed.  Constitutional:      Appearance: Normal appearance.  Abdominal:     General: Bowel sounds are normal. There is no distension.     Palpations: Abdomen is soft.     Tenderness: There is no abdominal tenderness.  Genitourinary:    Comments: Pt collects self swab before visit Neurological:     Mental Status: She is alert.     UC Treatments / Results  Labs (all labs ordered are listed, but only abnormal results are displayed) Labs Reviewed  CERVICOVAGINAL ANCILLARY ONLY    EKG   Radiology No results found.  Procedures Procedures (including critical care time)  Medications Ordered in UC Medications - No data to display  Initial Impression / Assessment and Plan / UC Course  I have reviewed the triage vital signs and the nursing notes.  Pertinent labs & imaging results that were available during my care of the patient were reviewed by me and considered in my medical decision making (see chart for details).     New.  Cytology pending.  Given history we are going to start with metronidazole vaginal gel while we await testing results.  Reviewed how to use along with common potential side effects and precautions.  Final Clinical Impressions(s) / UC Diagnoses   Final diagnoses:  Vaginal discharge   Discharge Instructions   None    ED Prescriptions     Medication Sig Dispense Auth. Provider   metroNIDAZOLE (METROGEL VAGINAL) 0.75 % vaginal gel Place 1 Applicatorful vaginally at bedtime. 70 g Tesla Keeler M, [place]      PDMP not reviewed this encounter.   Rushie Chestnut, [place] 09/28/20 1031

## 2020-09-29 LAB — CERVICOVAGINAL ANCILLARY ONLY
Bacterial Vaginitis (gardnerella): POSITIVE — AB
Candida Glabrata: NEGATIVE
Candida Vaginitis: POSITIVE — AB
Chlamydia: POSITIVE — AB
Comment: NEGATIVE
Comment: NEGATIVE
Comment: NEGATIVE
Comment: NEGATIVE
Comment: NEGATIVE
Comment: NORMAL
Neisseria Gonorrhea: NEGATIVE
Trichomonas: NEGATIVE

## 2020-09-30 ENCOUNTER — Telehealth (HOSPITAL_COMMUNITY): Payer: Self-pay | Admitting: Emergency Medicine

## 2020-09-30 ENCOUNTER — Ambulatory Visit (HOSPITAL_COMMUNITY)
Admission: EM | Admit: 2020-09-30 | Discharge: 2020-09-30 | Disposition: A | Discharge status: Home / Self Care | Payer: Medicaid Other

## 2020-09-30 ENCOUNTER — Other Ambulatory Visit: Payer: Self-pay

## 2020-09-30 DIAGNOSIS — Z419 Encounter for procedure for purposes other than remedying health state, unspecified: Secondary | ICD-10-CM | POA: Diagnosis not present

## 2020-09-30 MED ORDER — DOXYCYCLINE HYCLATE 100 MG PO CAPS: 100.0000 mg | ORAL_CAPSULE | Freq: Two times a day (BID) | ORAL | 0 refills | Status: AC

## 2020-09-30 MED ORDER — FLUCONAZOLE 150 MG PO TABS: 150.0000 mg | ORAL_TABLET | Freq: Once | ORAL | 0 refills | Status: AC

## 2020-10-01 DIAGNOSIS — F331 Major depressive disorder, recurrent, moderate: Secondary | ICD-10-CM | POA: Diagnosis not present

## 2020-10-18 ENCOUNTER — Other Ambulatory Visit: Payer: Self-pay

## 2020-10-18 ENCOUNTER — Encounter (HOSPITAL_COMMUNITY): Payer: Self-pay | Admitting: Emergency Medicine

## 2020-10-18 ENCOUNTER — Ambulatory Visit (HOSPITAL_COMMUNITY)
Admission: EM | Admit: 2020-10-18 | Discharge: 2020-10-18 | Disposition: A | Discharge status: Home / Self Care | Payer: Medicaid Other | Attending: Emergency Medicine | Admitting: Emergency Medicine

## 2020-10-18 DIAGNOSIS — K5900 Constipation, unspecified: Secondary | ICD-10-CM | POA: Insufficient documentation

## 2020-10-18 DIAGNOSIS — Z113 Encounter for screening for infections with a predominantly sexual mode of transmission: Secondary | ICD-10-CM | POA: Diagnosis not present

## 2020-10-18 LAB — POCT URINALYSIS DIPSTICK, ED / UC
Bilirubin Urine: NEGATIVE
Glucose, UA: NEGATIVE mg/dL
Leukocytes,Ua: NEGATIVE
Nitrite: NEGATIVE
Protein, ur: NEGATIVE mg/dL
Specific Gravity, Urine: >=1.03 (ref 1.005–1.030)
Urobilinogen, UA: 0.2 mg/dL (ref 0.0–1.0)
pH: 5.5 (ref 5.0–8.0)

## 2020-10-18 LAB — POC URINE PREG, ED: Preg Test, Ur: NEGATIVE

## 2020-10-18 MED ORDER — DOCUSATE SODIUM 250 MG PO CAPS: 250.0000 mg | ORAL_CAPSULE | Freq: Every day | ORAL | 0 refills | Status: DC

## 2020-10-18 NOTE — ED Provider Notes (Signed)
[facility]    CSN: [id number] Arrival date & time: 10/18/20  1408      History   Chief Complaint Chief Complaint  Patient presents with   Constipation   Flank Pain    HPI Yvonne [name] is a 35 y.o. female.   Patient here for evaluation of right sided flank pain and constipation that started this am.  Denies any dysuria, urgency, or frequency.  Also requesting STI screen.  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     Constipation Associated symptoms: back pain   Associated symptoms: no abdominal pain, no diarrhea, no dysuria, no nausea and no vomiting   Flank Pain Pertinent negatives include no abdominal pain.   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 - tx w/otc med   Asthma    albuterol inhaler   Blood transfusion    2008 with c/s 4 units transfused   Carpal tunnel syndrome on right    Chronic back pain    Depression    Dyspnea    exertion   Headache(784.0)    otc ibuprofen 800mg    Heart murmur    as a child   Obesity    Pneumonia    as a child   Thyroid disease     Patient Active Problem List   Diagnosis Date Noted   S/P lumbar spinal fusion 02/13/2017   Herniated nucleus pulposus, lumbar 04/10/2016   HNP (herniated nucleus pulposus), lumbar 11/05/2014    Class: Chronic   S/P endometrial ablation - HTA 2012 06/03/2012   Sterilization - h/o BTL 06/03/2012   OBESITY 09/03/2007   MIGRAINE HEADACHE 09/03/2007    Past Surgical History:  Procedure Laterality Date   ABDOMINAL HYSTERECTOMY N/A 09/29/2012   Procedure: HYSTERECTOMY ABDOMINAL;  Surgeon: Purcell NailsAngela Y Roberts, MD;  Location: [facility];  Service: Gynecology;  Laterality: N/A;   APPENDECTOMY     BACK SURGERY     BILATERAL SALPINGECTOMY Bilateral 09/29/2012   Procedure: BILATERAL SALPINGECTOMY;  Surgeon: Purcell NailsAngela Y Roberts, MD;  Location: [facility];  Service: Gynecology;  Laterality: Bilateral;    CESAREAN SECTION     CHOLECYSTECTOMY     CYSTOSCOPY Bilateral 09/29/2012   Procedure: CYSTOSCOPY;  Surgeon: Purcell NailsAngela Y Roberts, MD;  Location: [facility];  Service: Gynecology;  Laterality: Bilateral;   ENDOMETRIAL ABLATION     EXAMINATION UNDER ANESTHESIA N/A 10/16/2012   Procedure: EXAM UNDER ANESTHESIA;  Surgeon: Purcell NailsAngela Y Roberts, MD;  Location: [facility];  Service: Gynecology;  Laterality: N/A;  Suturing Vaginal Cuff   HAND SURGERY Right    carpal tunnel   LAPAROSCOPIC ASSISTED VAGINAL HYSTERECTOMY N/A 09/29/2012   Procedure: LAPAROSCOPIC ASSISTED VAGINAL HYSTERECTOMY;  Surgeon: Purcell NailsAngela Y Roberts, MD;  Location: [facility];  Service: Gynecology;  Laterality: N/A;   LAPAROSCOPIC HYSTERECTOMY Bilateral 09/29/2012   Procedure: HYSTERECTOMY TOTAL LAPAROSCOPIC;  Surgeon: Purcell NailsAngela Y Roberts, MD;  Location: [facility];  Service: Gynecology;  Laterality: Bilateral;  Total Laparoscopic Hysterectomy, Bilateral Salpingectomies, Cystoscopy poss. LAVH poss. TAH   LAPAROSCOPY     x3   LUMBAR LAMINECTOMY N/A 11/05/2014   Procedure: RIGHT L4-5 MICRODISCECTOMY ;  Surgeon: Kerrin ChampagneJames E Nitka, MD;  Location: [facility];  Service: Orthopedics;  Laterality: N/A;   LUMBAR LAMINECTOMY/DECOMPRESSION MICRODISCECTOMY N/A 04/10/2016   Procedure: Right L4-5 Microdiscectomy;  Surgeon: Kerrin ChampagneJames E Nitka, MD;  Location: [facility];  Service: Orthopedics;  Laterality:  N/A;   THYROIDECTOMY     THYROIDECTOMY     TONSILLECTOMY     T & A   TRANSFORAMINAL LUMBAR INTERBODY FUSION (TLIF) WITH PEDICLE SCREW FIXATION 1 LEVEL N/A 02/13/2017   Procedure: Transforaminal Lumbar Interbody Fusion - Lumbar four-five;  Surgeon: Tia Alert, MD;  Location: [facility];  Service: Neurosurgery;  Laterality: N/A;   TUBAL LIGATION      OB History     Gravida  4   Para  4   Term  4   Preterm      AB      Living  1      SAB      IAB      Ectopic      Multiple      Live Births  1            Home Medications    Prior to Admission medications   Medication  Sig Start Date End Date Taking? Authorizing Provider  docusate sodium (COLACE) 250 MG capsule Take 1 capsule (250 mg total) by mouth daily. 10/18/20  Yes Ivette Loyal, NP  nortriptyline (PAMELOR) 10 MG capsule 10 mg. 08/31/20  Yes [provider]  oxyCODONE-acetaminophen (PERCOCET/ROXICET) 5-325 MG tablet Take by mouth. 06/18/19  Yes [provider]  aluminum-magnesium hydroxide 200-200 MG/5ML suspension Take 15 mLs by mouth every 6 (six) hours as needed for indigestion. 08/09/20   White, Elita Boone, NP  famotidine (PEPCID) 20 MG tablet Take 1 tablet (20 mg total) by mouth 2 (two) times daily. 08/09/20   White, Elita Boone, NP  HYDROcodone-acetaminophen (NORCO/VICODIN) 5-325 MG tablet Take 1 tablet by mouth every 6 (six) hours as needed for moderate pain. 05/14/19   Kerrin Champagne, MD  levothyroxine (SYNTHROID) 150 MCG tablet Take by mouth. 08/15/19   [provider]  metroNIDAZOLE (METROGEL VAGINAL) 0.75 % vaginal gel Place 1 Applicatorful vaginally at bedtime. 09/28/20   Rushie Chestnut, PA-C  ondansetron (ZOFRAN) 4 MG tablet Take 1 tablet (4 mg total) by mouth every 6 (six) hours. 08/09/20   White, Elita Boone, NP  QUEtiapine (SEROQUEL) 300 MG tablet Take 300 mg by mouth at bedtime.    [provider]  rizatriptan (MAXALT) 10 MG tablet Take 10 mg by mouth. 07/12/20   [provider]    Family History Family History  Problem Relation Age of Onset   Diabetes Mother    Diabetes Maternal Grandmother    Stroke Father     Social History Social History   Tobacco Use   Smoking status: Every Day    Packs/day: 1.00    Types: Cigarettes    Last attempt to quit: 10/17/2015    Years since quitting: 5.0   Smokeless tobacco: Never  Vaping Use   Vaping Use: Some days  Substance Use Topics   Alcohol use: Yes    Alcohol/week: 1.0 standard drink    Types: 1 Glasses of wine per week    Comment: ocassionally   Drug use: No     Allergies   Aspirin, Bee  venom, Metoclopramide, Reglan [metoclopramide hcl], and Metronidazole   Review of Systems Review of Systems  Gastrointestinal:  Positive for constipation. Negative for abdominal pain, diarrhea, nausea and vomiting.  Genitourinary:  Positive for flank pain. Negative for dysuria, frequency, hematuria, urgency, vaginal bleeding, vaginal discharge and vaginal pain.  Musculoskeletal:  Positive for back pain.  All other systems reviewed and are negative.   Physical Exam Triage Vital  Signs ED Triage Vitals  Enc Vitals Group     BP --      Pulse Rate 10/18/20 1512 76     Resp --      Temp --      Temp src --      SpO2 10/18/20 1512 100 %     Weight --      Height --      Head Circumference --      Peak Flow --      Pain Score 10/18/20 1509 6     Pain Loc --      Pain Edu? --      Excl. in GC? --    No data found.  Updated Vital Signs BP 123/73 (BP Location: Left Arm)   Pulse 76   Temp 98.4 F (36.9 C)   Resp 16   LMP 07/25/2012   SpO2 100%   Visual Acuity Right Eye Distance:   Left Eye Distance:   Bilateral Distance:    Right Eye Near:   Left Eye Near:    Bilateral Near:     Physical Exam Vitals and nursing note reviewed.  Constitutional:      General: She is not in acute distress.    Appearance: Normal appearance. She is not ill-appearing, toxic-appearing or diaphoretic.  HENT:     Head: Normocephalic and atraumatic.  Eyes:     Conjunctiva/sclera: Conjunctivae normal.  Cardiovascular:     Rate and Rhythm: Normal rate.     Pulses: Normal pulses.  Pulmonary:     Effort: Pulmonary effort is normal.  Abdominal:     General: Abdomen is flat.     Tenderness: There is no right CVA tenderness or left CVA tenderness.  Musculoskeletal:        General: Normal range of motion.     Cervical back: Normal range of motion.     Lumbar back: Tenderness (right lower back pain) present. No bony tenderness. Negative right straight leg raise test and negative left straight  leg raise test.  Skin:    General: Skin is warm and dry.  Neurological:     General: No focal deficit present.     Mental Status: She is alert and oriented to person, place, and time.  Psychiatric:        Mood and Affect: Mood normal.     UC Treatments / Results  Labs (all labs ordered are listed, but only abnormal results are displayed) Labs Reviewed  POCT URINALYSIS DIPSTICK, ED / UC - Abnormal; Notable for the following components:      Result Value   Ketones, ur TRACE (*)    Hgb urine dipstick LARGE (*)    All other components within normal limits  POC URINE PREG, ED  CERVICOVAGINAL ANCILLARY ONLY    EKG   Radiology No results found.  Procedures Procedures (including critical care time)  Medications Ordered in UC Medications - No data to display  Initial Impression / Assessment and Plan / UC Course  I have reviewed the triage vital signs and the nursing notes.  Pertinent labs & imaging results that were available during my care of the patient were reviewed by me and considered in my medical decision making (see chart for details).    Assessment negative for red flags or concerns.  Urine pregnancy test negative, urinalysis with no signs of infection.  Self-Swab obtained and will treat based on results.  Back pain likely due to constipation related to  pain medication use.  Recommend Miralax daily, increase water intake, and increase amount of fiber in diet.  Follow up with primary care as needed.   Final Clinical Impressions(s) / UC Diagnoses   Final diagnoses:  Constipation, unspecified constipation type  Screen for STD (sexually transmitted disease)     Discharge Instructions      Take Miralax once a day to help until you have a good bowel movement. You can take the colace daily to help prevent hard or painful bowel movements.   Make sure you are drinking plenty fluids, especially water.  Try to increase the amount of fiber in your diet.    If your swab is  positive for anything, we will contact you.  The results will also show up in your MyChart.    Return or go to the Emergency Department if symptoms worsen or do not improve in the next few days.      ED Prescriptions     Medication Sig Dispense Auth. Provider   docusate sodium (COLACE) 250 MG capsule Take 1 capsule (250 mg total) by mouth daily. 10 capsule Ivette Loyal, NP      PDMP not reviewed this encounter.   Ivette Loyal, NP 10/18/20 1547

## 2020-10-18 NOTE — Discharge Instructions (Addendum)
Take Miralax once a day to help until you have a good bowel movement. You can take the colace daily to help prevent hard or painful bowel movements.   Make sure you are drinking plenty fluids, especially water.  Try to increase the amount of fiber in your diet.    If your swab is positive for anything, we will contact you.  The results will also show up in your MyChart.    Return or go to the Emergency Department if symptoms worsen or do not improve in the next few days.

## 2020-10-18 NOTE — ED Triage Notes (Signed)
Patient c/o constipation that started this morning.   Patient c/o RT sided flank pain that started this morning.   Patient denies dysuria, hematuria, abnormal vaginal discharge, and ABD pain.   Patient requesting STI Testing.   Patient endorses long term narcotic use.   History of Back pain, previous surgeries per patient statement.

## 2020-10-19 ENCOUNTER — Encounter (HOSPITAL_COMMUNITY): Payer: Self-pay

## 2020-10-19 ENCOUNTER — Encounter (HOSPITAL_COMMUNITY): Payer: Self-pay | Admitting: Emergency Medicine

## 2020-10-19 ENCOUNTER — Emergency Department (HOSPITAL_COMMUNITY)
Admission: EM | Admit: 2020-10-19 | Discharge: 2020-10-19 | Discharge status: Against Medical Advice | Payer: Medicaid Other | Attending: Emergency Medicine | Admitting: Emergency Medicine

## 2020-10-19 ENCOUNTER — Other Ambulatory Visit: Payer: Self-pay

## 2020-10-19 ENCOUNTER — Ambulatory Visit (HOSPITAL_COMMUNITY)
Admission: EM | Admit: 2020-10-19 | Discharge: 2020-10-19 | Disposition: A | Discharge status: Home / Self Care | Payer: Medicaid Other | Attending: Internal Medicine | Admitting: Internal Medicine

## 2020-10-19 DIAGNOSIS — S29012A Strain of muscle and tendon of back wall of thorax, initial encounter: Secondary | ICD-10-CM

## 2020-10-19 DIAGNOSIS — Z5321 Procedure and treatment not carried out due to patient leaving prior to being seen by health care provider: Secondary | ICD-10-CM | POA: Diagnosis not present

## 2020-10-19 DIAGNOSIS — M549 Dorsalgia, unspecified: Secondary | ICD-10-CM | POA: Diagnosis not present

## 2020-10-19 DIAGNOSIS — M546 Pain in thoracic spine: Secondary | ICD-10-CM | POA: Insufficient documentation

## 2020-10-19 LAB — CERVICOVAGINAL ANCILLARY ONLY
Bacterial Vaginitis (gardnerella): NEGATIVE
Candida Glabrata: NEGATIVE
Candida Vaginitis: NEGATIVE
Chlamydia: NEGATIVE
Comment: NEGATIVE
Comment: NEGATIVE
Comment: NEGATIVE
Comment: NEGATIVE
Comment: NEGATIVE
Comment: NORMAL
Neisseria Gonorrhea: NEGATIVE
Trichomonas: NEGATIVE

## 2020-10-19 LAB — POCT URINALYSIS DIPSTICK, ED / UC
Bilirubin Urine: NEGATIVE
Glucose, UA: NEGATIVE mg/dL
Ketones, ur: NEGATIVE mg/dL
Leukocytes,Ua: NEGATIVE
Nitrite: NEGATIVE
Protein, ur: NEGATIVE mg/dL
Specific Gravity, Urine: 1.02 (ref 1.005–1.030)
Urobilinogen, UA: 0.2 mg/dL (ref 0.0–1.0)
pH: 6 (ref 5.0–8.0)

## 2020-10-19 MED ORDER — KETOROLAC TROMETHAMINE 30 MG/ML IJ SOLN
30.0000 mg | Freq: Once | INTRAMUSCULAR | Status: AC
  Administered 2020-10-19: 30 mg via INTRAMUSCULAR

## 2020-10-19 MED ORDER — KETOROLAC TROMETHAMINE 30 MG/ML IJ SOLN
INTRAMUSCULAR | Status: AC
  Filled 2020-10-19: qty 1

## 2020-10-19 NOTE — ED Triage Notes (Addendum)
Patient from UC c/o right back pain since yesterday. States got toradol shot without relief. Denies urinary symptoms. States pain worsens with movement. Hx back surgery.

## 2020-10-19 NOTE — Discharge Instructions (Addendum)
You were given ketorolac injection in urgent care today. Please do not take any over-the-counter ibuprofen, Advil, Aleve for at least 24 hours following ketorolac injection.  Please use ice application to affected area of pain.  Please call provided contact information for orthopedic specialist in the next day if pain does not improve or if it worsens.  May also call orthopedist that you have seen previously for your spinal surgery if you wish.  Please go to the hospital if pain significantly worsens or if you develop bowel or bladder incontinence.

## 2020-10-19 NOTE — ED Provider Notes (Signed)
[facility]    CSN: [id number] Arrival date & time: 10/19/20  0844      History   Chief Complaint Chief Complaint  Patient presents with   Back Pain    Right side    HPI Yvonne [name] is a 35 y.o. female.   Patient presents to the urgent care for right sided back pain that has been present for 2 days.  Patient was seen yesterday for same back pain.  Patient took her prescribed oxycodone this morning with no resolution or improvement in back pain.  Pain is worse with any movement especially when going from sitting to standing position.  Urinalysis was completed yesterday that was negative for any signs of urinary tract infection.  Vaginal swab is pending for STD testing.  Provider note from yesterday states suspicion of constipation causing back pain from yesterday.  Was prescribed Colace which patient took yesterday with a result of 1 formed bowel movement this morning.  Denies any urinary burning, frequency, vaginal irritation, vaginal discharge, vaginal itching.  Denies any abdominal pain.  Denies any known fevers.  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  Patient does have history of spinal surgery that was completed in 2019.   Back Pain  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 - tx w/otc med   Asthma    albuterol inhaler   Blood transfusion    2008 with c/s 4 units transfused   Carpal tunnel syndrome on right    Chronic back pain    Depression    Dyspnea    exertion   Headache(784.0)    otc ibuprofen 800mg    Heart murmur    as a child   Obesity    Pneumonia    as a child   Thyroid disease     Patient Active Problem List   Diagnosis Date Noted   S/P lumbar spinal fusion 02/13/2017   Herniated nucleus pulposus, lumbar 04/10/2016   HNP (herniated nucleus pulposus), lumbar 11/05/2014    Class:  Chronic   S/P endometrial ablation - HTA 2012 06/03/2012   Sterilization - h/o BTL 06/03/2012   OBESITY 09/03/2007   MIGRAINE HEADACHE 09/03/2007    Past Surgical History:  Procedure Laterality Date   ABDOMINAL HYSTERECTOMY N/A 09/29/2012   Procedure: HYSTERECTOMY ABDOMINAL;  Surgeon: 10/01/2012, MD;  [facility]: [facility];  Service: Gynecology;  Laterality: N/A;   APPENDECTOMY     BACK SURGERY     BILATERAL SALPINGECTOMY Bilateral 09/29/2012   Procedure: BILATERAL SALPINGECTOMY;  Surgeon: 10/01/2012, MD;  [facility];  Service: Gynecology;  Laterality: Bilateral;   CESAREAN SECTION     CHOLECYSTECTOMY     CYSTOSCOPY Bilateral 09/29/2012   Procedure: CYSTOSCOPY;  Surgeon: 10/01/2012, MD;  [facility];  Service: Gynecology;  Laterality: Bilateral;   ENDOMETRIAL ABLATION     EXAMINATION UNDER ANESTHESIA N/A 10/16/2012   Procedure: EXAM UNDER ANESTHESIA;  Surgeon: 10/18/2012, MD;  [facility];  Service: Gynecology;  Laterality: N/A;  Suturing Vaginal Cuff   HAND SURGERY Right    carpal tunnel   LAPAROSCOPIC ASSISTED VAGINAL HYSTERECTOMY N/A 09/29/2012   Procedure: LAPAROSCOPIC ASSISTED VAGINAL HYSTERECTOMY;  Surgeon: 10/01/2012, MD;  [facility];  Service: Gynecology;  Laterality: N/A;   LAPAROSCOPIC HYSTERECTOMY Bilateral 09/29/2012   Procedure: HYSTERECTOMY TOTAL LAPAROSCOPIC;  Surgeon: Purcell Nails, MD;  Location: [facility];  Service: Gynecology;  Laterality: Bilateral;  Total Laparoscopic Hysterectomy, Bilateral Salpingectomies, Cystoscopy poss. LAVH poss. TAH   LAPAROSCOPY     x3   LUMBAR LAMINECTOMY N/A 11/05/2014   Procedure: RIGHT L4-5 MICRODISCECTOMY ;  Surgeon: Kerrin Champagne, MD;  [facility];  Service: Orthopedics;  Laterality: N/A;   LUMBAR LAMINECTOMY/DECOMPRESSION MICRODISCECTOMY N/A 04/10/2016   Procedure: Right L4-5 Microdiscectomy;  Surgeon: Kerrin Champagne, MD;  [facility];  Service: Orthopedics;  Laterality: N/A;    THYROIDECTOMY     THYROIDECTOMY     TONSILLECTOMY     T & A   TRANSFORAMINAL LUMBAR INTERBODY FUSION (TLIF) WITH PEDICLE SCREW FIXATION 1 LEVEL N/A 02/13/2017   Procedure: Transforaminal Lumbar Interbody Fusion - Lumbar four-five;  Surgeon: Tia Alert, MD;  Location: [facility] OR;  Service: Neurosurgery;  Laterality: N/A;   TUBAL LIGATION      OB History     Gravida  4   Para  4   Term  4   Preterm      AB      Living  1      SAB      IAB      Ectopic      Multiple      Live Births  1            Home Medications    Prior to Admission medications   Medication Sig Start Date End Date Taking? Authorizing Provider  aluminum-magnesium hydroxide 200-200 MG/5ML suspension Take 15 mLs by mouth every 6 (six) hours as needed for indigestion. 08/09/20   White, Elita Boone, NP  docusate sodium (COLACE) 250 MG capsule Take 1 capsule (250 mg total) by mouth daily. 10/18/20   Ivette Loyal, NP  famotidine (PEPCID) 20 MG tablet Take 1 tablet (20 mg total) by mouth 2 (two) times daily. 08/09/20   White, Elita Boone, NP  HYDROcodone-acetaminophen (NORCO/VICODIN) 5-325 MG tablet Take 1 tablet by mouth every 6 (six) hours as needed for moderate pain. 05/14/19   Kerrin Champagne, MD  levothyroxine (SYNTHROID) 150 MCG tablet Take by mouth. 08/15/19   [provider]  metroNIDAZOLE (METROGEL VAGINAL) 0.75 % vaginal gel Place 1 Applicatorful vaginally at bedtime. 09/28/20   Rushie Chestnut, PA-C  nortriptyline (PAMELOR) 10 MG capsule 10 mg. 08/31/20   [provider]  ondansetron (ZOFRAN) 4 MG tablet Take 1 tablet (4 mg total) by mouth every 6 (six) hours. 08/09/20   Valinda Hoar, NP  oxyCODONE-acetaminophen (PERCOCET/ROXICET) 5-325 MG tablet Take by mouth. 06/18/19   [provider]  QUEtiapine (SEROQUEL) 300 MG tablet Take 300 mg by mouth at bedtime.    [provider]  rizatriptan (MAXALT) 10 MG tablet Take 10 mg by mouth. 07/12/20   [provider]    Family History Family History  Problem Relation Age of Onset   Diabetes Mother    Diabetes Maternal Grandmother    Stroke Father     Social History Social History   Tobacco Use   Smoking status: Every Day    Packs/day: 1.00    Types: Cigarettes    Last attempt to quit: 10/17/2015    Years since quitting: 5.0   Smokeless tobacco: Never  Vaping Use   Vaping Use: Some days  Substance Use Topics   Alcohol use: Yes  Alcohol/week: 1.0 standard drink    Types: 1 Glasses of wine per week    Comment: ocassionally   Drug use: No     Allergies   Aspirin, Bee venom, Metoclopramide, Reglan [metoclopramide hcl], and Metronidazole   Review of Systems Review of Systems Per HPI  Physical Exam Triage Vital Signs ED Triage Vitals [10/19/20 1003]  Enc Vitals Group     BP 108/63     Pulse Rate 79     Resp 17     Temp 98 F (36.7 C)     Temp Source Oral     SpO2 100 %     Weight      Height      Head Circumference      Peak Flow      Pain Score 10     Pain Loc      Pain Edu?      Excl. in GC?    No data found.  Updated Vital Signs BP 108/63 (BP Location: Right Arm)   Pulse 79   Temp 98 F (36.7 C) (Oral)   Resp 17   LMP 07/25/2012   SpO2 100%   Visual Acuity Right Eye Distance:   Left Eye Distance:   Bilateral Distance:    Right Eye Near:   Left Eye Near:    Bilateral Near:     Physical Exam Constitutional:      General: She is not in acute distress.    Appearance: Normal appearance.  HENT:     Head: Normocephalic and atraumatic.  Eyes:     Extraocular Movements: Extraocular movements intact.     Conjunctiva/sclera: Conjunctivae normal.  Cardiovascular:     Rate and Rhythm: Normal rate and regular rhythm.     Pulses: Normal pulses.     Heart sounds: Normal heart sounds.  Pulmonary:     Effort: Pulmonary effort is normal. No respiratory distress.     Breath sounds: Normal breath sounds.  Abdominal:     General: Abdomen is flat. Bowel  sounds are normal. There is no distension.     Palpations: Abdomen is soft.     Tenderness: There is no abdominal tenderness.  Musculoskeletal:     Cervical back: Normal.     Thoracic back: Tenderness present. No swelling, edema or bony tenderness.     Lumbar back: Negative right straight leg raise test and negative left straight leg raise test.     Comments: Tenderness to palpation to musculature directly to right of thoracic spine at approximately T10-T12.  No direct tenderness to palpation over thoracic spine.  Neurological:     General: No focal deficit present.     Mental Status: She is alert and oriented to person, place, and time. Mental status is at baseline.     Sensory: Sensation is intact.     Deep Tendon Reflexes: Reflexes are normal and symmetric.  Psychiatric:        Mood and Affect: Mood normal.        Behavior: Behavior normal.        Thought Content: Thought content normal.        Judgment: Judgment normal.     UC Treatments / Results  Labs (all labs ordered are listed, but only abnormal results are displayed) Labs Reviewed  POCT URINALYSIS DIPSTICK, ED / UC - Abnormal; Notable for the following components:      Result Value   Hgb urine dipstick MODERATE (*)    All other  components within normal limits  URINE CULTURE    EKG   Radiology No results found.  Procedures Procedures (including critical care time)  Medications Ordered in UC Medications  ketorolac (TORADOL) 30 MG/ML injection 30 mg (30 mg Intramuscular Given 10/19/20 1044)    Initial Impression / Assessment and Plan / UC Course  I have reviewed the triage vital signs and the nursing notes.  Pertinent labs & imaging results that were available during my care of the patient were reviewed by me and considered in my medical decision making (see chart for details).     Urinalysis completed again showing no signs of urinary tract infection.  Low suspicion of urinary tract infection but will send  urine culture for confirmation due to red blood cells seen on urinalysis.  Also low suspicion for kidney stone as there is pain to palpation to thoracic musculature as well as pain that is associated with movement.  Suspect right thoracic muscle strain that is causing patient pain.  Will give ketorolac 30 mg to decrease pain and inflammation given patient's stated verbal history of successful treatment of pain with ketorolac in the past.  Patient to use ice application to affected area as well.  Patient to follow-up with orthopedist specialist if pain does not resolve in the next 24 hours.  Patient advised to go to the hospital if pain significantly worsens or if any bowel or bladder incontinence develop.  Discussed strict return precautions. Patient verbalized understanding and is agreeable with plan.  Final Clinical Impressions(s) / UC Diagnoses   Final diagnoses:  Acute right-sided thoracic back pain  Strain of thoracic back region     Discharge Instructions      You were given ketorolac injection in urgent care today. Please do not take any over-the-counter ibuprofen, Advil, Aleve for at least 24 hours following ketorolac injection.  Please use ice application to affected area of pain.  Please call provided contact information for orthopedic specialist in the next day if pain does not improve or if it worsens.  May also call orthopedist that you have seen previously for your spinal surgery if you wish.  Please go to the hospital if pain significantly worsens or if you develop bowel or bladder incontinence.     ED Prescriptions   None    PDMP not reviewed this encounter.   Lance MussFowler, Adria Costley E, FNP 10/19/20 1051

## 2020-10-19 NOTE — ED Triage Notes (Signed)
Pt presents with right side back pain x 2 days,   States she was seen yesterday and was tested for a UTI. States she has taken OTC medication at home that has not given relief.   Pt states the pain has gotten worse.

## 2020-10-20 DIAGNOSIS — M5459 Other low back pain: Secondary | ICD-10-CM | POA: Diagnosis not present

## 2020-10-20 DIAGNOSIS — M7918 Myalgia, other site: Secondary | ICD-10-CM | POA: Diagnosis not present

## 2020-10-20 DIAGNOSIS — Z90710 Acquired absence of both cervix and uterus: Secondary | ICD-10-CM | POA: Diagnosis not present

## 2020-10-20 DIAGNOSIS — R319 Hematuria, unspecified: Secondary | ICD-10-CM | POA: Diagnosis not present

## 2020-10-20 DIAGNOSIS — M549 Dorsalgia, unspecified: Secondary | ICD-10-CM | POA: Diagnosis not present

## 2020-10-20 DIAGNOSIS — R109 Unspecified abdominal pain: Secondary | ICD-10-CM | POA: Diagnosis not present

## 2020-10-20 LAB — URINE CULTURE: Culture: <10000 — AB

## 2020-10-23 DIAGNOSIS — Z79899 Other long term (current) drug therapy: Secondary | ICD-10-CM | POA: Diagnosis not present

## 2020-10-23 DIAGNOSIS — G894 Chronic pain syndrome: Secondary | ICD-10-CM | POA: Diagnosis not present

## 2020-10-23 DIAGNOSIS — M4327 Fusion of spine, lumbosacral region: Secondary | ICD-10-CM | POA: Diagnosis not present

## 2020-10-23 DIAGNOSIS — F17210 Nicotine dependence, cigarettes, uncomplicated: Secondary | ICD-10-CM | POA: Diagnosis not present

## 2020-10-29 ENCOUNTER — Ambulatory Visit (HOSPITAL_COMMUNITY): Payer: Self-pay

## 2020-10-29 DIAGNOSIS — N760 Acute vaginitis: Secondary | ICD-10-CM | POA: Diagnosis not present

## 2020-10-29 DIAGNOSIS — N898 Other specified noninflammatory disorders of vagina: Secondary | ICD-10-CM | POA: Diagnosis not present

## 2020-10-31 DIAGNOSIS — Z419 Encounter for procedure for purposes other than remedying health state, unspecified: Secondary | ICD-10-CM | POA: Diagnosis not present

## 2020-11-02 DIAGNOSIS — F331 Major depressive disorder, recurrent, moderate: Secondary | ICD-10-CM | POA: Diagnosis not present

## 2020-11-05 DIAGNOSIS — F331 Major depressive disorder, recurrent, moderate: Secondary | ICD-10-CM | POA: Diagnosis not present

## 2020-11-08 DIAGNOSIS — G43709 Chronic migraine without aura, not intractable, without status migrainosus: Secondary | ICD-10-CM | POA: Diagnosis not present

## 2020-11-08 DIAGNOSIS — Z79899 Other long term (current) drug therapy: Secondary | ICD-10-CM | POA: Diagnosis not present

## 2020-11-09 DIAGNOSIS — F331 Major depressive disorder, recurrent, moderate: Secondary | ICD-10-CM | POA: Diagnosis not present

## 2020-11-12 DIAGNOSIS — F331 Major depressive disorder, recurrent, moderate: Secondary | ICD-10-CM | POA: Diagnosis not present

## 2020-11-16 DIAGNOSIS — F331 Major depressive disorder, recurrent, moderate: Secondary | ICD-10-CM | POA: Diagnosis not present

## 2020-11-19 DIAGNOSIS — F331 Major depressive disorder, recurrent, moderate: Secondary | ICD-10-CM | POA: Diagnosis not present

## 2020-11-23 DIAGNOSIS — F331 Major depressive disorder, recurrent, moderate: Secondary | ICD-10-CM | POA: Diagnosis not present

## 2020-11-26 DIAGNOSIS — F331 Major depressive disorder, recurrent, moderate: Secondary | ICD-10-CM | POA: Diagnosis not present

## 2020-11-28 DIAGNOSIS — B3741 Candidal cystitis and urethritis: Secondary | ICD-10-CM | POA: Diagnosis not present

## 2020-11-28 DIAGNOSIS — Z202 Contact with and (suspected) exposure to infections with a predominantly sexual mode of transmission: Secondary | ICD-10-CM | POA: Diagnosis not present

## 2020-11-29 DIAGNOSIS — F331 Major depressive disorder, recurrent, moderate: Secondary | ICD-10-CM | POA: Diagnosis not present

## 2020-11-30 DIAGNOSIS — F331 Major depressive disorder, recurrent, moderate: Secondary | ICD-10-CM | POA: Diagnosis not present

## 2020-12-01 DIAGNOSIS — Z419 Encounter for procedure for purposes other than remedying health state, unspecified: Secondary | ICD-10-CM | POA: Diagnosis not present

## 2020-12-02 DIAGNOSIS — G894 Chronic pain syndrome: Secondary | ICD-10-CM | POA: Diagnosis not present

## 2020-12-02 DIAGNOSIS — F17210 Nicotine dependence, cigarettes, uncomplicated: Secondary | ICD-10-CM | POA: Diagnosis not present

## 2020-12-02 DIAGNOSIS — E559 Vitamin D deficiency, unspecified: Secondary | ICD-10-CM | POA: Diagnosis not present

## 2020-12-02 DIAGNOSIS — Z1159 Encounter for screening for other viral diseases: Secondary | ICD-10-CM | POA: Diagnosis not present

## 2020-12-02 DIAGNOSIS — Z79899 Other long term (current) drug therapy: Secondary | ICD-10-CM | POA: Diagnosis not present

## 2020-12-02 DIAGNOSIS — M4327 Fusion of spine, lumbosacral region: Secondary | ICD-10-CM | POA: Diagnosis not present

## 2020-12-02 DIAGNOSIS — M129 Arthropathy, unspecified: Secondary | ICD-10-CM | POA: Diagnosis not present

## 2020-12-06 DIAGNOSIS — F330 Major depressive disorder, recurrent, mild: Secondary | ICD-10-CM | POA: Diagnosis not present

## 2020-12-06 DIAGNOSIS — F331 Major depressive disorder, recurrent, moderate: Secondary | ICD-10-CM | POA: Diagnosis not present

## 2020-12-06 DIAGNOSIS — F411 Generalized anxiety disorder: Secondary | ICD-10-CM | POA: Diagnosis not present

## 2020-12-06 DIAGNOSIS — G479 Sleep disorder, unspecified: Secondary | ICD-10-CM | POA: Diagnosis not present

## 2020-12-07 DIAGNOSIS — F331 Major depressive disorder, recurrent, moderate: Secondary | ICD-10-CM | POA: Diagnosis not present

## 2020-12-13 DIAGNOSIS — F331 Major depressive disorder, recurrent, moderate: Secondary | ICD-10-CM | POA: Diagnosis not present

## 2020-12-14 DIAGNOSIS — F331 Major depressive disorder, recurrent, moderate: Secondary | ICD-10-CM | POA: Diagnosis not present

## 2020-12-20 DIAGNOSIS — F331 Major depressive disorder, recurrent, moderate: Secondary | ICD-10-CM | POA: Diagnosis not present

## 2020-12-21 DIAGNOSIS — F331 Major depressive disorder, recurrent, moderate: Secondary | ICD-10-CM | POA: Diagnosis not present

## 2020-12-28 DIAGNOSIS — F331 Major depressive disorder, recurrent, moderate: Secondary | ICD-10-CM | POA: Diagnosis not present

## 2020-12-31 DIAGNOSIS — Z419 Encounter for procedure for purposes other than remedying health state, unspecified: Secondary | ICD-10-CM | POA: Diagnosis not present

## 2021-01-02 DIAGNOSIS — Z79899 Other long term (current) drug therapy: Secondary | ICD-10-CM | POA: Diagnosis not present

## 2021-01-02 DIAGNOSIS — G894 Chronic pain syndrome: Secondary | ICD-10-CM | POA: Diagnosis not present

## 2021-01-02 DIAGNOSIS — M4327 Fusion of spine, lumbosacral region: Secondary | ICD-10-CM | POA: Diagnosis not present

## 2021-01-02 DIAGNOSIS — F17210 Nicotine dependence, cigarettes, uncomplicated: Secondary | ICD-10-CM | POA: Diagnosis not present

## 2021-01-04 DIAGNOSIS — F331 Major depressive disorder, recurrent, moderate: Secondary | ICD-10-CM | POA: Diagnosis not present

## 2021-01-11 DIAGNOSIS — F331 Major depressive disorder, recurrent, moderate: Secondary | ICD-10-CM | POA: Diagnosis not present

## 2021-01-18 DIAGNOSIS — F331 Major depressive disorder, recurrent, moderate: Secondary | ICD-10-CM | POA: Diagnosis not present

## 2021-01-25 DIAGNOSIS — F331 Major depressive disorder, recurrent, moderate: Secondary | ICD-10-CM | POA: Diagnosis not present

## 2021-01-31 DIAGNOSIS — Z419 Encounter for procedure for purposes other than remedying health state, unspecified: Secondary | ICD-10-CM | POA: Diagnosis not present

## 2021-02-02 DIAGNOSIS — M4327 Fusion of spine, lumbosacral region: Secondary | ICD-10-CM | POA: Diagnosis not present

## 2021-02-02 DIAGNOSIS — G894 Chronic pain syndrome: Secondary | ICD-10-CM | POA: Diagnosis not present

## 2021-02-02 DIAGNOSIS — Z79899 Other long term (current) drug therapy: Secondary | ICD-10-CM | POA: Diagnosis not present

## 2021-02-02 DIAGNOSIS — F17210 Nicotine dependence, cigarettes, uncomplicated: Secondary | ICD-10-CM | POA: Diagnosis not present

## 2021-02-03 ENCOUNTER — Ambulatory Visit (HOSPITAL_COMMUNITY)
Admission: EM | Admit: 2021-02-03 | Discharge: 2021-02-03 | Disposition: A | Discharge status: Home / Self Care | Payer: Medicaid Other | Attending: Emergency Medicine | Admitting: Emergency Medicine

## 2021-02-03 ENCOUNTER — Other Ambulatory Visit: Payer: Self-pay

## 2021-02-03 ENCOUNTER — Encounter (HOSPITAL_COMMUNITY): Payer: Self-pay

## 2021-02-03 DIAGNOSIS — N760 Acute vaginitis: Secondary | ICD-10-CM

## 2021-02-03 DIAGNOSIS — B9689 Other specified bacterial agents as the cause of diseases classified elsewhere: Secondary | ICD-10-CM | POA: Diagnosis not present

## 2021-02-03 DIAGNOSIS — F329 Major depressive disorder, single episode, unspecified: Secondary | ICD-10-CM | POA: Diagnosis not present

## 2021-02-03 MED ORDER — METRONIDAZOLE 0.75 % VA GEL: VAGINAL | 0 refills | Status: DC

## 2021-02-03 NOTE — ED Provider Notes (Signed)
HPI  SUBJECTIVE:  Yvonne [name] is a 35 y.o. female who presents with 1 week of odorous brown vaginal discharge and vaginal odor.  No abdominal, pelvic, back pain, urinary complaints, vaginal itching, genital rash, abnormal vaginal bleeding.  She is in a long-term monogamous relationship with a female, who is asymptomatic.  STDs are not a concern today.  he had negative STD testing 3 weeks ago.  She states that she was seen recently at another urgent care, prescribed MetroGel with partial improvement in her symptoms.  No aggravating factors.  She has a past medical history of gonorrhea, chlamydia, frequent BV and yeast infections.  No history of diabetes.  LMP: Status post hysterectomy.  PMD: [facility].   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 - tx w/otc med   Asthma    albuterol inhaler   Blood transfusion    2008 with c/s 4 units transfused   Carpal tunnel syndrome on right    Chronic back pain    Depression    Dyspnea    exertion   Headache(784.0)    otc ibuprofen 800mg    Heart murmur    as a child   Obesity    Pneumonia    as a child   Thyroid disease     Past Surgical History:  Procedure Laterality Date   ABDOMINAL HYSTERECTOMY N/A 09/29/2012   Procedure: HYSTERECTOMY ABDOMINAL;  Surgeon: 10/01/2012, MD;  [facility];  Service: Gynecology;  Laterality: N/A;   APPENDECTOMY     BACK SURGERY     BILATERAL SALPINGECTOMY Bilateral 09/29/2012   Procedure: BILATERAL SALPINGECTOMY;  Surgeon: 10/01/2012, MD;  [facility];  Service: Gynecology;  Laterality: Bilateral;   CESAREAN SECTION     CHOLECYSTECTOMY     CYSTOSCOPY Bilateral 09/29/2012   Procedure: CYSTOSCOPY;  Surgeon: 10/01/2012, MD;  [facility];  Service: Gynecology;  Laterality: Bilateral;   ENDOMETRIAL ABLATION     EXAMINATION UNDER ANESTHESIA N/A 10/16/2012   Procedure: EXAM UNDER ANESTHESIA;   Surgeon: 10/18/2012, MD;  [facility];  Service: Gynecology;  Laterality: N/A;  Suturing Vaginal Cuff   HAND SURGERY Right    carpal tunnel   LAPAROSCOPIC ASSISTED VAGINAL HYSTERECTOMY N/A 09/29/2012   Procedure: LAPAROSCOPIC ASSISTED VAGINAL HYSTERECTOMY;  Surgeon: 10/01/2012, MD;  [facility];  Service: Gynecology;  Laterality: N/A;   LAPAROSCOPIC HYSTERECTOMY Bilateral 09/29/2012   Procedure: HYSTERECTOMY TOTAL LAPAROSCOPIC;  Surgeon: 10/01/2012, MD;  [facility];  Service: Gynecology;  Laterality: Bilateral;  Total Laparoscopic Hysterectomy, Bilateral Salpingectomies, Cystoscopy poss. LAVH poss. TAH   LAPAROSCOPY     x3   LUMBAR LAMINECTOMY N/A 11/05/2014   Procedure: RIGHT L4-5 MICRODISCECTOMY ;  Surgeon: 01/05/2015, MD;  [facility];  Service: Orthopedics;  Laterality: N/A;   LUMBAR LAMINECTOMY/DECOMPRESSION MICRODISCECTOMY N/A 04/10/2016   Procedure: Right L4-5 Microdiscectomy;  Surgeon: 06/08/2016, MD;  [facility];  Service: Orthopedics;  Laterality: N/A;   THYROIDECTOMY     THYROIDECTOMY     TONSILLECTOMY     T & A   TRANSFORAMINAL LUMBAR INTERBODY FUSION (TLIF) WITH PEDICLE SCREW FIXATION 1 LEVEL N/A 02/13/2017   Procedure: Transforaminal Lumbar Interbody Fusion - Lumbar four-five;  Surgeon: 02/15/2017, MD;  Location: [facility];  Service: Neurosurgery;  Laterality: N/A;  TUBAL LIGATION      Family History  Problem Relation Age of Onset   Diabetes Mother    Diabetes Maternal Grandmother    Stroke Father     Social History   Tobacco Use   Smoking status: Every Day    Packs/day: 1.00    Types: Cigarettes    Last attempt to quit: 10/17/2015    Years since quitting: 5.3   Smokeless tobacco: Never  Vaping Use   Vaping Use: Some days  Substance Use Topics   Alcohol use: Yes    Alcohol/week: 1.0 standard drink    Types: 1 Glasses of wine per week    Comment: ocassionally   Drug use: No    No current facility-administered  medications for this encounter.  Current Outpatient Medications:    metroNIDAZOLE (METROGEL) 0.75 % vaginal gel, 1 applicator full pv qhs x 5 days, Disp: 70 g, Rfl: 0   aluminum-magnesium hydroxide 200-200 MG/5ML suspension, Take 15 mLs by mouth every 6 (six) hours as needed for indigestion., Disp: 355 mL, Rfl: 0   docusate sodium (COLACE) 250 MG capsule, Take 1 capsule (250 mg total) by mouth daily., Disp: 10 capsule, Rfl: 0   famotidine (PEPCID) 20 MG tablet, Take 1 tablet (20 mg total) by mouth 2 (two) times daily., Disp: 30 tablet, Rfl: 0   HYDROcodone-acetaminophen (NORCO/VICODIN) 5-325 MG tablet, Take 1 tablet by mouth every 6 (six) hours as needed for moderate pain., Disp: 30 tablet, Rfl: 0   levothyroxine (SYNTHROID) 150 MCG tablet, Take by mouth., Disp: , Rfl:    nortriptyline (PAMELOR) 10 MG capsule, 10 mg., Disp: , Rfl:    ondansetron (ZOFRAN) 4 MG tablet, Take 1 tablet (4 mg total) by mouth every 6 (six) hours., Disp: 12 tablet, Rfl: 0   oxyCODONE-acetaminophen (PERCOCET/ROXICET) 5-325 MG tablet, Take by mouth., Disp: , Rfl:    QUEtiapine (SEROQUEL) 300 MG tablet, Take 300 mg by mouth at bedtime., Disp: , Rfl:    rizatriptan (MAXALT) 10 MG tablet, Take 10 mg by mouth., Disp: , Rfl:   Allergies  Allergen Reactions   Aspirin Nausea And Vomiting and Anaphylaxis    GI upset   Bee Venom Anaphylaxis, Swelling and Other (See Comments)    SYNCOPE SWELLING OF ENTIRE BODY AND THROAT  Other reaction(s): Unknown SYNCOPE SWELLING OF ENTIRE BODY AND THROAT  Fainting Swelling of whole body and throat  fainting   Metoclopramide Anaphylaxis   Reglan [Metoclopramide Hcl] Anaphylaxis   Metronidazole Nausea Only     ROS  As noted in HPI.   Physical Exam  BP 128/77 (BP Location: Left Arm)   Pulse 66   Temp 98.2 F (36.8 C) (Oral)   Resp 17   LMP 07/25/2012   SpO2 100%   Constitutional: Well developed, well nourished, no acute distress Eyes:  EOMI, conjunctiva normal  bilaterally HENT: Normocephalic, atraumatic,mucus membranes moist Respiratory: Normal inspiratory effort Cardiovascular: Normal rate GI: nondistended soft, nontender. No suprapubic, flank tenderness  back: No CVA tenderness GU: Deferred skin: No rash, skin intact Musculoskeletal: no deformities Neurologic: Alert & oriented x 3, no focal neuro deficits Psychiatric: Speech and behavior appropriate   ED Course   Medications - No data to display  No orders of the defined types were placed in this encounter.   No results found for this or any previous visit (from the past 24 hour(s)). No results found.  ED Clinical Impression  1. BV (bacterial vaginosis)     ED Assessment/Plan  Previous  labs reviewed.  Patient had chlamydia, BV and yeast in 09/28/2020.  Last vaginal swab was 10/18/2020 negative.  Presentation consistent with recurrent BV.   will also check yeast.  Patient declined other STD testing as her partner tested negative for STDs 3 weeks ago..  Will send home with MetroGel she states Flagyl makes her sick.  MetroGel works well for her.  Will provide primary care list and order assistance in finding a PMD.  Meds ordered this encounter  Medications   metroNIDAZOLE (METROGEL) 0.75 % vaginal gel    Sig: 1 applicator full pv qhs x 5 days    Dispense:  70 g    Refill:  0    *This clinic note was created using Scientist, clinical (histocompatibility and immunogenetics). Therefore, there may be occasional mistakes despite careful proofreading.  ?     Domenick Gong, MD 02/04/21 [phone]
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
## 2021-02-03 NOTE — ED Triage Notes (Signed)
Pt presents with vaginal issues. States she was here before and was told she has BV States she was not able to pick up diflucan.

## 2021-02-06 LAB — CERVICOVAGINAL ANCILLARY ONLY
Bacterial Vaginitis (gardnerella): POSITIVE — AB
Candida Glabrata: NEGATIVE
Candida Vaginitis: NEGATIVE
Comment: NEGATIVE
Comment: NEGATIVE
Comment: NEGATIVE

## 2021-02-09 DIAGNOSIS — F329 Major depressive disorder, single episode, unspecified: Secondary | ICD-10-CM | POA: Diagnosis not present

## 2021-02-22 ENCOUNTER — Other Ambulatory Visit: Payer: Self-pay

## 2021-02-22 ENCOUNTER — Ambulatory Visit (INDEPENDENT_AMBULATORY_CARE_PROVIDER_SITE_OTHER): Payer: Medicaid Other

## 2021-02-22 ENCOUNTER — Encounter: Payer: Self-pay | Admitting: Podiatry

## 2021-02-22 ENCOUNTER — Ambulatory Visit (INDEPENDENT_AMBULATORY_CARE_PROVIDER_SITE_OTHER): Payer: Medicaid Other | Admitting: Podiatry

## 2021-02-22 DIAGNOSIS — S99912A Unspecified injury of left ankle, initial encounter: Secondary | ICD-10-CM

## 2021-02-22 DIAGNOSIS — M7662 Achilles tendinitis, left leg: Secondary | ICD-10-CM

## 2021-02-22 MED ORDER — MELOXICAM 15 MG PO TABS: 15.0000 mg | ORAL_TABLET | Freq: Every day | ORAL | 0 refills | Status: DC

## 2021-02-22 NOTE — Patient Instructions (Signed)
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
## 2021-03-01 DIAGNOSIS — F329 Major depressive disorder, single episode, unspecified: Secondary | ICD-10-CM | POA: Diagnosis not present

## 2021-03-02 DIAGNOSIS — Z419 Encounter for procedure for purposes other than remedying health state, unspecified: Secondary | ICD-10-CM | POA: Diagnosis not present

## 2021-03-03 DIAGNOSIS — F17210 Nicotine dependence, cigarettes, uncomplicated: Secondary | ICD-10-CM | POA: Diagnosis not present

## 2021-03-03 DIAGNOSIS — M4327 Fusion of spine, lumbosacral region: Secondary | ICD-10-CM | POA: Diagnosis not present

## 2021-03-03 DIAGNOSIS — G894 Chronic pain syndrome: Secondary | ICD-10-CM | POA: Diagnosis not present

## 2021-03-03 DIAGNOSIS — Z79899 Other long term (current) drug therapy: Secondary | ICD-10-CM | POA: Diagnosis not present

## 2021-03-11 ENCOUNTER — Ambulatory Visit (HOSPITAL_COMMUNITY): Payer: Self-pay

## 2021-03-23 DIAGNOSIS — F329 Major depressive disorder, single episode, unspecified: Secondary | ICD-10-CM | POA: Diagnosis not present

## 2021-04-02 DIAGNOSIS — Z419 Encounter for procedure for purposes other than remedying health state, unspecified: Secondary | ICD-10-CM | POA: Diagnosis not present

## 2021-04-05 ENCOUNTER — Ambulatory Visit (INDEPENDENT_AMBULATORY_CARE_PROVIDER_SITE_OTHER): Payer: Medicaid Other | Admitting: Podiatry

## 2021-04-05 DIAGNOSIS — Z91199 Patient's noncompliance with other medical treatment and regimen due to unspecified reason: Secondary | ICD-10-CM

## 2021-04-05 NOTE — Progress Notes (Signed)
No show

## 2021-04-06 DIAGNOSIS — M4327 Fusion of spine, lumbosacral region: Secondary | ICD-10-CM | POA: Diagnosis not present

## 2021-04-06 DIAGNOSIS — F17210 Nicotine dependence, cigarettes, uncomplicated: Secondary | ICD-10-CM | POA: Diagnosis not present

## 2021-04-06 DIAGNOSIS — G894 Chronic pain syndrome: Secondary | ICD-10-CM | POA: Diagnosis not present

## 2021-04-06 DIAGNOSIS — M62830 Muscle spasm of back: Secondary | ICD-10-CM | POA: Diagnosis not present

## 2021-04-06 DIAGNOSIS — Z79899 Other long term (current) drug therapy: Secondary | ICD-10-CM | POA: Diagnosis not present

## 2021-04-11 DIAGNOSIS — B9689 Other specified bacterial agents as the cause of diseases classified elsewhere: Secondary | ICD-10-CM | POA: Diagnosis not present

## 2021-04-11 DIAGNOSIS — N898 Other specified noninflammatory disorders of vagina: Secondary | ICD-10-CM | POA: Diagnosis not present

## 2021-04-12 DIAGNOSIS — F329 Major depressive disorder, single episode, unspecified: Secondary | ICD-10-CM | POA: Diagnosis not present

## 2021-04-12 DIAGNOSIS — N760 Acute vaginitis: Secondary | ICD-10-CM | POA: Diagnosis not present

## 2021-04-14 ENCOUNTER — Other Ambulatory Visit (HOSPITAL_BASED_OUTPATIENT_CLINIC_OR_DEPARTMENT_OTHER): Payer: Self-pay

## 2021-04-14 DIAGNOSIS — R0683 Snoring: Secondary | ICD-10-CM

## 2021-04-14 DIAGNOSIS — R5383 Other fatigue: Secondary | ICD-10-CM

## 2021-04-24 ENCOUNTER — Encounter: Payer: Self-pay | Admitting: Podiatry

## 2021-04-24 ENCOUNTER — Ambulatory Visit: Payer: Medicaid Other | Admitting: Podiatry

## 2021-04-24 ENCOUNTER — Other Ambulatory Visit: Payer: Self-pay

## 2021-04-24 DIAGNOSIS — M7662 Achilles tendinitis, left leg: Secondary | ICD-10-CM | POA: Diagnosis not present

## 2021-04-24 MED ORDER — METHYLPREDNISOLONE 4 MG PO TBPK: ORAL_TABLET | ORAL | 0 refills | Status: DC

## 2021-04-24 NOTE — Progress Notes (Signed)
°  Subjective:  Patient ID: Yvonne [name], female    DOB: 1985-10-25,   MRN: [id number]  Chief Complaint  Patient presents with   Tendonitis     F/U Lt tendonitis -pt states," pain is still there." - worse with walking; 9/10 sharp constant pain -w/ swelling - gait issues due to pain tx: none      36 y.o. female presents for follow-up of left achilles tendonitis. Patient relates that the pain is still there. Relates the heel lifts did not help. States she has occasionally done the stretching and was unable to take the meloxicam due to pain management. Denies any other pedal complaints. Denies n/v/f/c.   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 CFT <3 seconds. Normal hair growth on digits. No edema.  Skin. No lacerations or abrasions bilateral feet.  Musculoskeletal: MMT 5/5 bilateral lower extremities in DF, PF, Inversion and Eversion. Deceased ROM in DF of ankle joint. Most tender to the achilles insertion with no pain proximally on the tendon. Some tenderness to dorsal foot and peroneal tendons as well as along the PT tendon.  Neurological: Sensation intact to light touch.   Assessment:   1. Tendonitis, Achilles, left       Plan:  Patient was evaluated and treated and all questions answered. -Xrays reviewed -Discussed Achilles insertional tendonitis and treatment options with patient.  -Continue stretching.  -Medrol dose pack provided.   -CAM boot provided.  -Discussed if no improvement will consider  MRI/PT/EPAT/PRP injections.  -Patient to return to office in 6 weeks for re-evaluation.   , DPM

## 2021-04-28 DIAGNOSIS — F329 Major depressive disorder, single episode, unspecified: Secondary | ICD-10-CM | POA: Diagnosis not present

## 2021-05-03 DIAGNOSIS — Z419 Encounter for procedure for purposes other than remedying health state, unspecified: Secondary | ICD-10-CM | POA: Diagnosis not present

## 2021-05-07 DIAGNOSIS — G894 Chronic pain syndrome: Secondary | ICD-10-CM | POA: Diagnosis not present

## 2021-05-07 DIAGNOSIS — Z6841 Body Mass Index (BMI) 40.0 and over, adult: Secondary | ICD-10-CM | POA: Diagnosis not present

## 2021-05-07 DIAGNOSIS — Z79899 Other long term (current) drug therapy: Secondary | ICD-10-CM | POA: Diagnosis not present

## 2021-05-07 DIAGNOSIS — F17210 Nicotine dependence, cigarettes, uncomplicated: Secondary | ICD-10-CM | POA: Diagnosis not present

## 2021-05-07 DIAGNOSIS — M4327 Fusion of spine, lumbosacral region: Secondary | ICD-10-CM | POA: Diagnosis not present

## 2021-05-07 DIAGNOSIS — M62830 Muscle spasm of back: Secondary | ICD-10-CM | POA: Diagnosis not present

## 2021-05-18 DIAGNOSIS — N898 Other specified noninflammatory disorders of vagina: Secondary | ICD-10-CM | POA: Diagnosis not present

## 2021-05-31 DIAGNOSIS — Z419 Encounter for procedure for purposes other than remedying health state, unspecified: Secondary | ICD-10-CM | POA: Diagnosis not present

## 2021-06-01 DIAGNOSIS — F329 Major depressive disorder, single episode, unspecified: Secondary | ICD-10-CM | POA: Diagnosis not present

## 2021-06-05 ENCOUNTER — Ambulatory Visit: Payer: Medicaid Other | Admitting: Podiatry

## 2021-06-08 DIAGNOSIS — N760 Acute vaginitis: Secondary | ICD-10-CM | POA: Diagnosis not present

## 2021-06-08 DIAGNOSIS — B9689 Other specified bacterial agents as the cause of diseases classified elsewhere: Secondary | ICD-10-CM | POA: Diagnosis not present

## 2021-06-08 DIAGNOSIS — R110 Nausea: Secondary | ICD-10-CM | POA: Diagnosis not present

## 2021-06-09 DIAGNOSIS — G894 Chronic pain syndrome: Secondary | ICD-10-CM | POA: Diagnosis not present

## 2021-06-09 DIAGNOSIS — E559 Vitamin D deficiency, unspecified: Secondary | ICD-10-CM | POA: Diagnosis not present

## 2021-06-09 DIAGNOSIS — M62830 Muscle spasm of back: Secondary | ICD-10-CM | POA: Diagnosis not present

## 2021-06-09 DIAGNOSIS — Z6841 Body Mass Index (BMI) 40.0 and over, adult: Secondary | ICD-10-CM | POA: Diagnosis not present

## 2021-06-09 DIAGNOSIS — Z79899 Other long term (current) drug therapy: Secondary | ICD-10-CM | POA: Diagnosis not present

## 2021-06-09 DIAGNOSIS — M129 Arthropathy, unspecified: Secondary | ICD-10-CM | POA: Diagnosis not present

## 2021-06-09 DIAGNOSIS — E890 Postprocedural hypothyroidism: Secondary | ICD-10-CM | POA: Diagnosis not present

## 2021-06-09 DIAGNOSIS — M4327 Fusion of spine, lumbosacral region: Secondary | ICD-10-CM | POA: Diagnosis not present

## 2021-06-09 DIAGNOSIS — F17210 Nicotine dependence, cigarettes, uncomplicated: Secondary | ICD-10-CM | POA: Diagnosis not present

## 2021-06-16 DIAGNOSIS — Z79899 Other long term (current) drug therapy: Secondary | ICD-10-CM | POA: Diagnosis not present

## 2021-06-30 DIAGNOSIS — F329 Major depressive disorder, single episode, unspecified: Secondary | ICD-10-CM | POA: Diagnosis not present

## 2021-07-01 DIAGNOSIS — Z419 Encounter for procedure for purposes other than remedying health state, unspecified: Secondary | ICD-10-CM | POA: Diagnosis not present

## 2021-07-13 ENCOUNTER — Ambulatory Visit (HOSPITAL_COMMUNITY)
Admission: EM | Admit: 2021-07-13 | Discharge: 2021-07-13 | Disposition: A | Discharge status: Home / Self Care | Payer: Medicaid Other | Attending: Physician Assistant | Admitting: Physician Assistant

## 2021-07-13 ENCOUNTER — Encounter (HOSPITAL_COMMUNITY): Payer: Self-pay | Admitting: Emergency Medicine

## 2021-07-13 DIAGNOSIS — B3731 Acute candidiasis of vulva and vagina: Secondary | ICD-10-CM | POA: Diagnosis not present

## 2021-07-13 DIAGNOSIS — N898 Other specified noninflammatory disorders of vagina: Secondary | ICD-10-CM | POA: Diagnosis not present

## 2021-07-13 DIAGNOSIS — N760 Acute vaginitis: Secondary | ICD-10-CM | POA: Diagnosis not present

## 2021-07-13 DIAGNOSIS — B9689 Other specified bacterial agents as the cause of diseases classified elsewhere: Secondary | ICD-10-CM | POA: Diagnosis not present

## 2021-07-13 MED ORDER — METRONIDAZOLE 0.75 % VA GEL: VAGINAL | 1 refills | Status: DC

## 2021-07-13 NOTE — ED Provider Notes (Signed)
?[facility] ? ? ? ?CSN: [id number] ?Arrival date & time: 07/13/21  1425 ? ? ?  ? ?History   ?Chief Complaint ?Chief Complaint  ?Patient presents with  ? Vaginal Discharge  ? Medication Refill  ? ? ?HPI ?Yvonne [name] is a 36 y.o. female.  ? ?Patient presents today with a 1 week history of vaginal discharge and odor.  She denies any pelvic pain, abdominal pain, fever, nausea, vomiting.  She is confident that she is not pregnant.  She reports being seen for this and started on MetroGel.  There was a problem with the MetroGel tube where medication was coming out of the bottom and so she ran out of this before completing treatment.  She contacted the pharmacy but they were unwilling to provide a another medication without reevaluation.  Patient reports that she is doing well on this medication and just requires a refill.  She denies any recent antibiotics.  Denies any exposure to STI.  She is open to testing today.  Denies any changes to personal hygiene products including soaps or detergents.  Denies any recent medication changes or antibiotic use. ? ? ?Past Medical History:  ?Diagnosis Date  ? Anemia   ? Anxiety   ? Arthritis   ? in spine and slip disk in lower back - tx w/otc med  ? Asthma   ? albuterol inhaler  ? Blood transfusion   ? 2008 with c/s 4 units transfused  ? Carpal tunnel syndrome on right   ? Chronic back pain   ? Depression   ? Dyspnea   ? exertion  ? Headache(784.0)   ? otc ibuprofen 800mg   ? Heart murmur   ? as a child  ? Obesity   ? Pneumonia   ? as a child  ? Thyroid disease   ? ? ?Patient Active Problem List  ? Diagnosis Date Noted  ? S/P lumbar spinal fusion 02/13/2017  ? Herniated nucleus pulposus, lumbar 04/10/2016  ? HNP (herniated nucleus pulposus), lumbar 11/05/2014  ?  Class: Chronic  ? S/P endometrial ablation - HTA 2012 06/03/2012  ? Sterilization - h/o BTL 06/03/2012  ? OBESITY 09/03/2007  ? MIGRAINE HEADACHE 09/03/2007  ? ? ?Past Surgical History:  ?Procedure Laterality  Date  ? ABDOMINAL HYSTERECTOMY N/A 09/29/2012  ? Procedure: HYSTERECTOMY ABDOMINAL;  Surgeon: 10/01/2012, MD;  Location: [facility];  Service: Gynecology;  Laterality: N/A;  ? APPENDECTOMY    ? BACK SURGERY    ? BILATERAL SALPINGECTOMY Bilateral 09/29/2012  ? Procedure: BILATERAL SALPINGECTOMY;  Surgeon: 10/01/2012, MD;  Location: [facility];  Service: Gynecology;  Laterality: Bilateral;  ? CESAREAN SECTION    ? CHOLECYSTECTOMY    ? CYSTOSCOPY Bilateral 09/29/2012  ? Procedure: CYSTOSCOPY;  Surgeon: 10/01/2012, MD;  Location: [facility];  Service: Gynecology;  Laterality: Bilateral;  ? ENDOMETRIAL ABLATION    ? EXAMINATION UNDER ANESTHESIA N/A 10/16/2012  ? Procedure: EXAM UNDER ANESTHESIA;  Surgeon: 10/18/2012, MD;  Location: [facility];  Service: Gynecology;  Laterality: N/A;  Suturing Vaginal Cuff  ? HAND SURGERY Right   ? carpal tunnel  ? LAPAROSCOPIC ASSISTED VAGINAL HYSTERECTOMY N/A 09/29/2012  ? Procedure: LAPAROSCOPIC ASSISTED VAGINAL HYSTERECTOMY;  Surgeon: 10/01/2012, MD;  Location: [facility];  Service: Gynecology;  Laterality: N/A;  ? LAPAROSCOPIC HYSTERECTOMY Bilateral 09/29/2012  ? Procedure: HYSTERECTOMY TOTAL LAPAROSCOPIC;  Surgeon: 10/01/2012, MD;  Location: [facility];  Service: Gynecology;  Laterality: Bilateral;  Total Laparoscopic  Hysterectomy, Bilateral Salpingectomies, Cystoscopy poss. LAVH poss. TAH  ? LAPAROSCOPY    ? x3  ? LUMBAR LAMINECTOMY N/A 11/05/2014  ? Procedure: RIGHT L4-5 MICRODISCECTOMY ;  Surgeon: Kerrin ChampagneJames E Nitka, MD;  [facility];  Service: Orthopedics;  Laterality: N/A;  ? LUMBAR LAMINECTOMY/DECOMPRESSION MICRODISCECTOMY N/A 04/10/2016  ? Procedure: Right L4-5 Microdiscectomy;  Surgeon: Kerrin ChampagneJames E Nitka, MD;  [facility];  Service: Orthopedics;  Laterality: N/A;  ? THYROIDECTOMY    ? THYROIDECTOMY    ? TONSILLECTOMY    ? T & A  ? TRANSFORAMINAL LUMBAR INTERBODY FUSION (TLIF) WITH PEDICLE SCREW FIXATION 1 LEVEL N/A 02/13/2017  ? Procedure: Transforaminal Lumbar Interbody  Fusion - Lumbar four-five;  Surgeon: Tia AlertJones, David S, MD;  [facility];  Service: Neurosurgery;  Laterality: N/A;  ? TUBAL LIGATION    ? ? ?OB History   ? ? Gravida  ?4  ? Para  ?4  ? Term  ?4  ? Preterm  ?   ? AB  ?   ? Living  ?1  ?  ? ? SAB  ?   ? IAB  ?   ? Ectopic  ?   ? Multiple  ?   ? Live Births  ?1  ?   ?  ?  ? ? ? ?Home Medications   ? ?Prior to Admission medications   ?Medication Sig Start Date End Date Taking? Authorizing Provider  ?aluminum-magnesium hydroxide 200-200 MG/5ML suspension Take 15 mLs by mouth every 6 (six) hours as needed for indigestion. 08/09/20   Valinda HoarWhite, Adrienne R, NP  ?buPROPion (WELLBUTRIN XL) 150 MG 24 hr tablet Take 150 mg by mouth daily. 06/30/21   [provider]  ?docusate sodium (COLACE) 250 MG capsule Take 1 capsule (250 mg total) by mouth daily. 10/18/20   Ivette LoyalSmith, Fowler R, NP  ?escitalopram (LEXAPRO) 10 MG tablet Take 10 mg by mouth daily. 02/03/21   [provider]  ?famotidine (PEPCID) 20 MG tablet Take 1 tablet (20 mg total) by mouth 2 (two) times daily. 08/09/20   Valinda HoarWhite, Adrienne R, NP  ?hydrOXYzine (ATARAX) 50 MG tablet Take 50 mg by mouth 3 (three) times daily. 06/30/21   [provider]  ?levothyroxine (SYNTHROID) 150 MCG tablet Take by mouth. 08/15/19   [provider]  ?meloxicam (MOBIC) 15 MG tablet Take 1 tablet (15 mg total) by mouth daily. 02/22/21   Louann SjogrenSikora, Rebecca, MD  ?metroNIDAZOLE (METROGEL) 0.75 % vaginal gel 1 applicator full pv qhs x 5 days 07/13/21   Makeisha Jentsch, Noberto RetortErin K, PA-C  ?nortriptyline (PAMELOR) 10 MG capsule 10 mg. 08/31/20   [provider]  ?ondansetron (ZOFRAN) 4 MG tablet Take 1 tablet (4 mg total) by mouth every 6 (six) hours. 08/09/20   Valinda HoarWhite, Adrienne R, NP  ?oxyCODONE-acetaminophen (PERCOCET/ROXICET) 5-325 MG tablet Take by mouth. 06/18/19   [provider]  ?rizatriptan (MAXALT) 10 MG tablet Take 10 mg by mouth. 07/12/20   [provider]  ? ? ?Family History ?Family History  ?Problem  Relation Age of Onset  ? Diabetes Mother   ? Diabetes Maternal Grandmother   ? Stroke Father   ? ? ?Social History ?Social History  ? ?Tobacco Use  ? Smoking status: Every Day  ?  Packs/day: 1.00  ?  Types: Cigarettes  ?  Last attempt to quit: 10/17/2015  ?  Years since quitting: 5.7  ? Smokeless tobacco: Never  ?Vaping Use  ? Vaping Use: Some days  ?Substance Use Topics  ?  Alcohol use: Yes  ?  Alcohol/week: 1.0 standard drink  ?  Types: 1 Glasses of wine per week  ?  Comment: ocassionally  ? Drug use: No  ? ? ? ?Allergies   ?Aspirin, Bee venom, Metoclopramide, Reglan [metoclopramide hcl], and Metronidazole ? ? ?Review of Systems ?Review of Systems  ?Constitutional:  Negative for activity change, appetite change, fatigue and fever.  ?Gastrointestinal:  Negative for abdominal pain, diarrhea and nausea.  ?Genitourinary:  Positive for vaginal discharge. Negative for dysuria, frequency, urgency, vaginal bleeding and vaginal pain.  ?Neurological:  Negative for dizziness, light-headedness and headaches.  ? ? ?Physical Exam ?Triage Vital Signs ?ED Triage Vitals  ?Enc Vitals Group  ?   BP 07/13/21 1547 115/83  ?   Pulse Rate 07/13/21 1547 76  ?   Resp 07/13/21 1547 18  ?   Temp 07/13/21 1547 98.2 ?F (36.8 ?C)  ?   Temp src --   ?   SpO2 07/13/21 1547 94 %  ?   Weight --   ?   Height --   ?   Head Circumference --   ?   Peak Flow --   ?   Pain Score 07/13/21 1545 0  ?   Pain Loc --   ?   Pain Edu? --   ?   Excl. in GC? --   ? ?No data found. ? ?Updated Vital Signs ?BP 115/83   Pulse 76   Temp 98.2 ?F (36.8 ?C)   Resp 18   LMP 07/25/2012   SpO2 94%  ? ?Visual Acuity ?Right Eye Distance:   ?Left Eye Distance:   ?Bilateral Distance:   ? ?Right Eye Near:   ?Left Eye Near:    ?Bilateral Near:    ? ?Physical Exam ?Vitals reviewed.  ?Constitutional:   ?   General: She is awake. She is not in acute distress. ?   Appearance: Normal appearance. She is well-developed. She is not ill-appearing.  ?   Comments: Very pleasant female  appears stated age in no acute distress sitting comfortably in exam room  ?HENT:  ?   Head: Normocephalic and atraumatic.  ?Cardiovascular:  ?   Rate and Rhythm: Normal rate and regular rhythm.  ?   Heart sounds: Nelva Bush

## 2021-07-13 NOTE — Discharge Instructions (Signed)
I have sent in a refill of the MetroGel.  Please place 1 applicatorful into the vagina at night for 5 days.  Wear loosefitting cotton underwear and use hypoallergenic soaps and detergents.  We will contact you with your strep results if we need to arrange additional treatment.  It is important that you do not drink any alcohol with taking this medication as it can cause vomiting.  If you develop any pelvic pain, vaginal discharge, fever, nausea, vomiting you should be seen immediately. ?

## 2021-07-13 NOTE — ED Triage Notes (Signed)
Pt is present today with c/o vaginal discharge and odor. Pt states that her sx started one month ago.  ?

## 2021-07-14 ENCOUNTER — Telehealth (HOSPITAL_COMMUNITY): Payer: Self-pay | Admitting: Emergency Medicine

## 2021-07-14 LAB — CERVICOVAGINAL ANCILLARY ONLY
Bacterial Vaginitis (gardnerella): NEGATIVE
Candida Glabrata: NEGATIVE
Candida Vaginitis: POSITIVE — AB
Chlamydia: NEGATIVE
Comment: NEGATIVE
Comment: NEGATIVE
Comment: NEGATIVE
Comment: NEGATIVE
Comment: NEGATIVE
Comment: NORMAL
Neisseria Gonorrhea: NEGATIVE
Trichomonas: NEGATIVE

## 2021-07-14 MED ORDER — FLUCONAZOLE 150 MG PO TABS: 150.0000 mg | ORAL_TABLET | Freq: Once | ORAL | 0 refills | Status: AC

## 2021-07-21 DIAGNOSIS — G894 Chronic pain syndrome: Secondary | ICD-10-CM | POA: Diagnosis not present

## 2021-07-21 DIAGNOSIS — Z79899 Other long term (current) drug therapy: Secondary | ICD-10-CM | POA: Diagnosis not present

## 2021-07-21 DIAGNOSIS — M4327 Fusion of spine, lumbosacral region: Secondary | ICD-10-CM | POA: Diagnosis not present

## 2021-07-21 DIAGNOSIS — E890 Postprocedural hypothyroidism: Secondary | ICD-10-CM | POA: Diagnosis not present

## 2021-07-21 DIAGNOSIS — Z6841 Body Mass Index (BMI) 40.0 and over, adult: Secondary | ICD-10-CM | POA: Diagnosis not present

## 2021-07-21 DIAGNOSIS — F17210 Nicotine dependence, cigarettes, uncomplicated: Secondary | ICD-10-CM | POA: Diagnosis not present

## 2021-07-21 DIAGNOSIS — M62830 Muscle spasm of back: Secondary | ICD-10-CM | POA: Diagnosis not present

## 2021-07-31 DIAGNOSIS — F329 Major depressive disorder, single episode, unspecified: Secondary | ICD-10-CM | POA: Diagnosis not present

## 2021-07-31 DIAGNOSIS — Z419 Encounter for procedure for purposes other than remedying health state, unspecified: Secondary | ICD-10-CM | POA: Diagnosis not present

## 2021-08-31 DIAGNOSIS — Z419 Encounter for procedure for purposes other than remedying health state, unspecified: Secondary | ICD-10-CM | POA: Diagnosis not present

## 2021-09-01 DIAGNOSIS — F329 Major depressive disorder, single episode, unspecified: Secondary | ICD-10-CM | POA: Diagnosis not present

## 2021-09-15 DIAGNOSIS — E890 Postprocedural hypothyroidism: Secondary | ICD-10-CM | POA: Diagnosis not present

## 2021-09-15 DIAGNOSIS — M4327 Fusion of spine, lumbosacral region: Secondary | ICD-10-CM | POA: Diagnosis not present

## 2021-09-15 DIAGNOSIS — F17210 Nicotine dependence, cigarettes, uncomplicated: Secondary | ICD-10-CM | POA: Diagnosis not present

## 2021-09-15 DIAGNOSIS — M62830 Muscle spasm of back: Secondary | ICD-10-CM | POA: Diagnosis not present

## 2021-09-15 DIAGNOSIS — Z79899 Other long term (current) drug therapy: Secondary | ICD-10-CM | POA: Diagnosis not present

## 2021-09-15 DIAGNOSIS — Z6841 Body Mass Index (BMI) 40.0 and over, adult: Secondary | ICD-10-CM | POA: Diagnosis not present

## 2021-09-15 DIAGNOSIS — G894 Chronic pain syndrome: Secondary | ICD-10-CM | POA: Diagnosis not present

## 2021-09-20 DIAGNOSIS — Z79899 Other long term (current) drug therapy: Secondary | ICD-10-CM | POA: Diagnosis not present

## 2021-09-21 DIAGNOSIS — M25473 Effusion, unspecified ankle: Secondary | ICD-10-CM | POA: Diagnosis not present

## 2021-09-25 ENCOUNTER — Other Ambulatory Visit: Payer: Self-pay | Admitting: Radiology

## 2021-09-25 MED ORDER — IBUPROFEN 800 MG PO TABS: 800.0000 mg | ORAL_TABLET | Freq: Three times a day (TID) | ORAL | 0 refills | Status: DC | PRN

## 2021-09-27 DIAGNOSIS — F329 Major depressive disorder, single episode, unspecified: Secondary | ICD-10-CM | POA: Diagnosis not present

## 2021-09-28 ENCOUNTER — Ambulatory Visit (HOSPITAL_COMMUNITY)
Admission: EM | Admit: 2021-09-28 | Discharge: 2021-09-28 | Disposition: A | Discharge status: Home / Self Care | Payer: Medicaid Other | Attending: Internal Medicine | Admitting: Internal Medicine

## 2021-09-28 ENCOUNTER — Encounter (HOSPITAL_COMMUNITY): Payer: Self-pay

## 2021-09-28 DIAGNOSIS — G43909 Migraine, unspecified, not intractable, without status migrainosus: Secondary | ICD-10-CM | POA: Diagnosis not present

## 2021-09-28 MED ORDER — SUMATRIPTAN SUCCINATE 50 MG PO TABS
50.0000 mg | ORAL_TABLET | Freq: Once | ORAL | 0 refills | Status: DC
Start: 2021-09-28 — End: 2022-09-30

## 2021-09-28 MED ORDER — ONDANSETRON 4 MG PO TBDP
ORAL_TABLET | ORAL | Status: AC
  Filled 2021-09-28: qty 1

## 2021-09-28 MED ORDER — SUMATRIPTAN SUCCINATE 6 MG/0.5ML ~~LOC~~ SOLN
6.0000 mg | Freq: Once | SUBCUTANEOUS | Status: AC
  Administered 2021-09-28: 6 mg via SUBCUTANEOUS

## 2021-09-28 MED ORDER — ONDANSETRON 4 MG PO TBDP
4.0000 mg | ORAL_TABLET | Freq: Once | ORAL | Status: AC
  Administered 2021-09-28: 4 mg via ORAL

## 2021-09-28 MED ORDER — SUMATRIPTAN SUCCINATE 6 MG/0.5ML ~~LOC~~ SOLN
SUBCUTANEOUS | Status: AC
  Filled 2021-09-28: qty 0.5

## 2021-09-28 MED ORDER — DEXAMETHASONE SODIUM PHOSPHATE 10 MG/ML IJ SOLN
10.0000 mg | Freq: Once | INTRAMUSCULAR | Status: AC
  Administered 2021-09-28: 10 mg via INTRAMUSCULAR

## 2021-09-28 MED ORDER — DEXAMETHASONE SODIUM PHOSPHATE 10 MG/ML IJ SOLN
INTRAMUSCULAR | Status: AC
  Filled 2021-09-28: qty 1

## 2021-09-28 NOTE — ED Triage Notes (Signed)
Pt c/o migraine headaches 4 days. States out of her ibuprofen 800mg s. States has appt with neurology 7/5.  Pt c/o lt ankle swelling x4 days. Denies new injury. States was wearing a boot for sprain ankle in march but it has been doing better.

## 2021-09-28 NOTE — Discharge Instructions (Addendum)
Please take medications as prescribed Compression stockings will help with lower extremity swelling If you have headaches more than 3 times a week on a consistent basis you may benefit from chronic medication for your migraines Please keep your neurology appointment Return to urgent care if you have any other concerns.

## 2021-09-28 NOTE — ED Provider Notes (Signed)
[facility]    CSN: [id number] Arrival date & time: 09/28/21  1302      History   Chief Complaint Chief Complaint  Patient presents with   Migraine   Joint Swelling    HPI Yvonne [name] is a 36 y.o. female with a history of migraine comes to urgent care with 4-day history of left-sided headache.  Symptoms started 4 days ago and has been persistent.  Headache is unilateral, severe, aggravated by light and noise with no known relieving factors.  Patient has tried ibuprofen with no improvement in headaches.  It is associated with nausea.  No difficulty finding words or speech.  No numbness or tingling.  No extremity weakness.   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 - tx w/otc med   Asthma    albuterol inhaler   Blood transfusion    2008 with c/s 4 units transfused   Carpal tunnel syndrome on right    Chronic back pain    Depression    Dyspnea    exertion   Headache(784.0)    otc ibuprofen 800mg    Heart murmur    as a child   Obesity    Pneumonia    as a child   Thyroid disease     Patient Active Problem List   Diagnosis Date Noted   S/P lumbar spinal fusion 02/13/2017   Herniated nucleus pulposus, lumbar 04/10/2016   HNP (herniated nucleus pulposus), lumbar 11/05/2014    Class: Chronic   S/P endometrial ablation - HTA 2012 06/03/2012   Sterilization - h/o BTL 06/03/2012   OBESITY 09/03/2007   MIGRAINE HEADACHE 09/03/2007    Past Surgical History:  Procedure Laterality Date   ABDOMINAL HYSTERECTOMY N/A 09/29/2012   Procedure: HYSTERECTOMY ABDOMINAL;  Surgeon: 10/01/2012, MD;  Location: [facility];  Service: Gynecology;  Laterality: N/A;   APPENDECTOMY     BACK SURGERY     BILATERAL SALPINGECTOMY Bilateral 09/29/2012   Procedure: BILATERAL SALPINGECTOMY;  Surgeon: 10/01/2012, MD;  Location: [facility];  Service: Gynecology;  Laterality: Bilateral;   CESAREAN  SECTION     CHOLECYSTECTOMY     CYSTOSCOPY Bilateral 09/29/2012   Procedure: CYSTOSCOPY;  Surgeon: 10/01/2012, MD;  Location: [facility];  Service: Gynecology;  Laterality: Bilateral;   ENDOMETRIAL ABLATION     EXAMINATION UNDER ANESTHESIA N/A 10/16/2012   Procedure: EXAM UNDER ANESTHESIA;  Surgeon: 10/18/2012, MD;  Location: [facility];  Service: Gynecology;  Laterality: N/A;  Suturing Vaginal Cuff   HAND SURGERY Right    carpal tunnel   LAPAROSCOPIC ASSISTED VAGINAL HYSTERECTOMY N/A 09/29/2012   Procedure: LAPAROSCOPIC ASSISTED VAGINAL HYSTERECTOMY;  Surgeon: 10/01/2012, MD;  Location: [facility];  Service: Gynecology;  Laterality: N/A;   LAPAROSCOPIC HYSTERECTOMY Bilateral 09/29/2012   Procedure: HYSTERECTOMY TOTAL LAPAROSCOPIC;  Surgeon: 10/01/2012, MD;  Location: [facility];  Service: Gynecology;  Laterality: Bilateral;  Total Laparoscopic Hysterectomy, Bilateral Salpingectomies, Cystoscopy poss. LAVH poss. TAH   LAPAROSCOPY     x3   LUMBAR LAMINECTOMY N/A 11/05/2014   Procedure: RIGHT L4-5 MICRODISCECTOMY ;  Surgeon: 01/05/2015, MD;  Location: [facility];  Service: Orthopedics;  Laterality: N/A;   LUMBAR LAMINECTOMY/DECOMPRESSION MICRODISCECTOMY N/A 04/10/2016   Procedure: Right L4-5 Microdiscectomy;  Surgeon: 06/08/2016, MD;  Location: [facility];  Service: Orthopedics;  Laterality: N/A;   THYROIDECTOMY     THYROIDECTOMY     TONSILLECTOMY     T & A   TRANSFORAMINAL LUMBAR INTERBODY FUSION (TLIF) WITH PEDICLE SCREW FIXATION 1 LEVEL N/A 02/13/2017   Procedure: Transforaminal Lumbar Interbody Fusion - Lumbar four-five;  Surgeon: Tia Alert, MD;  Location: [facility];  Service: Neurosurgery;  Laterality: N/A;   TUBAL LIGATION      OB History     Gravida  4   Para  4   Term  4   Preterm      AB      Living  1      SAB      IAB      Ectopic      Multiple      Live Births  1            Home Medications    Prior to Admission medications   Medication Sig Start  Date End Date Taking? Authorizing Provider  SUMAtriptan (IMITREX) 50 MG tablet Take 1 tablet (50 mg total) by mouth once for 1 dose. May repeat in 2 hours if headache persists or recurs. 09/28/21 09/28/21 Yes Ifeanyi Mickelson, Britta Mccreedy, MD  aluminum-magnesium hydroxide 200-200 MG/5ML suspension Take 15 mLs by mouth every 6 (six) hours as needed for indigestion. 08/09/20   White, Elita Boone, NP  buPROPion (WELLBUTRIN XL) 150 MG 24 hr tablet Take 150 mg by mouth daily. 06/30/21   [provider]  docusate sodium (COLACE) 250 MG capsule Take 1 capsule (250 mg total) by mouth daily. 10/18/20   Ivette Loyal, NP  escitalopram (LEXAPRO) 10 MG tablet Take 10 mg by mouth daily. 02/03/21   [provider]  famotidine (PEPCID) 20 MG tablet Take 1 tablet (20 mg total) by mouth 2 (two) times daily. 08/09/20   Valinda Hoar, NP  hydrOXYzine (ATARAX) 50 MG tablet Take 50 mg by mouth 3 (three) times daily. 06/30/21   [provider]  ibuprofen (ADVIL) 800 MG tablet Take 1 tablet (800 mg total) by mouth every 8 (eight) hours as needed. 09/25/21   Kerrin Champagne, MD  levothyroxine (SYNTHROID) 150 MCG tablet Take by mouth. 08/15/19   [provider]  meloxicam (MOBIC) 15 MG tablet Take 1 tablet (15 mg total) by mouth daily. 02/22/21   Louann Sjogren, DPM  nortriptyline (PAMELOR) 10 MG capsule 10 mg. 08/31/20   [provider]  ondansetron (ZOFRAN) 4 MG tablet Take 1 tablet (4 mg total) by mouth every 6 (six) hours. 08/09/20   White, Elita Boone, NP  rizatriptan (MAXALT) 10 MG tablet Take 10 mg by mouth. 07/12/20   [provider]    Family History Family History  Problem Relation Age of Onset   Diabetes Mother    Diabetes Maternal Grandmother    Stroke Father     Social History Social History   Tobacco Use   Smoking status: Every Day    Packs/day: 1.00    Types: Cigarettes    Last attempt to quit: 10/17/2015    Years since quitting: 5.9   Smokeless tobacco: Never   Vaping Use   Vaping Use: Some days  Substance Use Topics   Alcohol use: Yes    Alcohol/week: 1.0 standard drink of alcohol    Types: 1 Glasses of wine per week    Comment: ocassionally   Drug use: No     Allergies   Aspirin, Bee venom, Metoclopramide, Reglan [metoclopramide hcl], and  Metronidazole   Review of Systems Review of Systems  HENT: Negative.    Respiratory: Negative.    Gastrointestinal:  Positive for nausea. Negative for abdominal pain and vomiting.  Musculoskeletal: Negative.   Neurological:  Positive for headaches. Negative for dizziness, speech difficulty, weakness and light-headedness.     Physical Exam Triage Vital Signs ED Triage Vitals  Enc Vitals Group     BP 09/28/21 1326 (!) 143/92     Pulse Rate 09/28/21 1326 84     Resp 09/28/21 1326 18     Temp 09/28/21 1326 98.4 F (36.9 C)     Temp Source 09/28/21 1326 Oral     SpO2 09/28/21 1326 97 %     Weight --      Height --      Head Circumference --      Peak Flow --      Pain Score 09/28/21 1327 10     Pain Loc --      Pain Edu? --      Excl. in GC? --    No data found.  Updated Vital Signs BP (!) 143/92 (BP Location: Left Arm)   Pulse 84   Temp 98.4 F (36.9 C) (Oral)   Resp 18   LMP 07/25/2012   SpO2 97%   Visual Acuity Right Eye Distance:   Left Eye Distance:   Bilateral Distance:    Right Eye Near:   Left Eye Near:    Bilateral Near:     Physical Exam Vitals and nursing note reviewed.  Constitutional:      General: She is not in acute distress.    Appearance: She is not ill-appearing.  HENT:     Right Ear: Tympanic membrane normal.     Left Ear: Tympanic membrane normal.     Mouth/Throat:     Mouth: Mucous membranes are moist.  Eyes:     Extraocular Movements: Extraocular movements intact.     Conjunctiva/sclera: Conjunctivae normal.     Pupils: Pupils are equal, round, and reactive to light.  Cardiovascular:     Rate and Rhythm: Normal rate and regular rhythm.      Pulses: Normal pulses.     Heart sounds: Normal heart sounds.  Pulmonary:     Effort: Pulmonary effort is normal.     Breath sounds: Normal breath sounds.  Abdominal:     General: Bowel sounds are normal.     Palpations: Abdomen is soft.  Musculoskeletal:        General: Normal range of motion.  Neurological:     General: No focal deficit present.     Mental Status: She is alert and oriented to person, place, and time.     Cranial Nerves: No cranial nerve deficit.     Sensory: No sensory deficit.     Motor: No weakness.     Coordination: Coordination normal.  Psychiatric:        Mood and Affect: Mood normal.      UC Treatments / Results  Labs (all labs ordered are listed, but only abnormal results are displayed) Labs Reviewed - No data to display  EKG   Radiology No results found.  Procedures Procedures (including critical care time)  Medications Ordered in UC Medications  dexamethasone (DECADRON) injection 10 mg (has no administration in time range)  ondansetron (ZOFRAN-ODT) disintegrating tablet 4 mg (has no administration in time range)  SUMAtriptan (IMITREX) injection 6 mg (has no administration in time range)  Initial Impression / Assessment and Plan / UC Course  I have reviewed the triage vital signs and the nursing notes.  Pertinent labs & imaging results that were available during my care of the patient were reviewed by me and considered in my medical decision making (see chart for details).     1.  Acute migraine: Dexamethasone 10 mg IM x1 dose Zofran ODT 4 mg x 1 dose Imitrex 6 mg subcu x1 dose Maintain adequate hydration Imitrex 50 mg every 6 hours as needed for headaches Return to urgent care if symptoms worsen.  2.  Lower extremity edema secondary to prolonged standing: Compression stockings if prolonged standing is anticipated Elevate lower extremities Return to urgent care if symptoms worsen. Final Clinical Impressions(s) / UC Diagnoses    Final diagnoses:  Acute migraine     Discharge Instructions      Please take medications as prescribed Compression stockings will help with lower extremity swelling If you have headaches more than 3 times a week on a consistent basis you may benefit from chronic medication for your migraines Please keep your neurology appointment Return to urgent care if you have any other concerns.     ED Prescriptions     Medication Sig Dispense Auth. Provider   SUMAtriptan (IMITREX) 50 MG tablet Take 1 tablet (50 mg total) by mouth once for 1 dose. May repeat in 2 hours if headache persists or recurs. 10 tablet Georgann Bramble, Britta Mccreedy, MD      PDMP not reviewed this encounter.   Merrilee Jansky, MD 09/28/21 [phone]

## 2021-09-30 DIAGNOSIS — Z419 Encounter for procedure for purposes other than remedying health state, unspecified: Secondary | ICD-10-CM | POA: Diagnosis not present

## 2021-10-05 ENCOUNTER — Other Ambulatory Visit: Payer: Self-pay

## 2021-10-05 ENCOUNTER — Emergency Department (HOSPITAL_COMMUNITY): Payer: Medicaid Other

## 2021-10-05 ENCOUNTER — Emergency Department (HOSPITAL_COMMUNITY)
Admission: EM | Admit: 2021-10-05 | Discharge: 2021-10-05 | Disposition: A | Discharge status: Home / Self Care | Payer: Medicaid Other | Attending: Emergency Medicine | Admitting: Emergency Medicine

## 2021-10-05 ENCOUNTER — Encounter (HOSPITAL_COMMUNITY): Payer: Self-pay

## 2021-10-05 DIAGNOSIS — R011 Cardiac murmur, unspecified: Secondary | ICD-10-CM | POA: Diagnosis not present

## 2021-10-05 DIAGNOSIS — F410 Panic disorder [episodic paroxysmal anxiety] without agoraphobia: Secondary | ICD-10-CM | POA: Diagnosis not present

## 2021-10-05 DIAGNOSIS — R079 Chest pain, unspecified: Secondary | ICD-10-CM | POA: Insufficient documentation

## 2021-10-05 DIAGNOSIS — G43909 Migraine, unspecified, not intractable, without status migrainosus: Secondary | ICD-10-CM | POA: Insufficient documentation

## 2021-10-05 DIAGNOSIS — Z743 Need for continuous supervision: Secondary | ICD-10-CM | POA: Diagnosis not present

## 2021-10-05 DIAGNOSIS — R0789 Other chest pain: Secondary | ICD-10-CM | POA: Diagnosis not present

## 2021-10-05 DIAGNOSIS — N179 Acute kidney failure, unspecified: Secondary | ICD-10-CM | POA: Diagnosis not present

## 2021-10-05 DIAGNOSIS — E876 Hypokalemia: Secondary | ICD-10-CM | POA: Insufficient documentation

## 2021-10-05 DIAGNOSIS — D72829 Elevated white blood cell count, unspecified: Secondary | ICD-10-CM | POA: Diagnosis not present

## 2021-10-05 DIAGNOSIS — G43809 Other migraine, not intractable, without status migrainosus: Secondary | ICD-10-CM

## 2021-10-05 DIAGNOSIS — R4589 Other symptoms and signs involving emotional state: Secondary | ICD-10-CM | POA: Diagnosis not present

## 2021-10-05 DIAGNOSIS — R0689 Other abnormalities of breathing: Secondary | ICD-10-CM | POA: Diagnosis not present

## 2021-10-05 DIAGNOSIS — R519 Headache, unspecified: Secondary | ICD-10-CM | POA: Diagnosis not present

## 2021-10-05 LAB — BASIC METABOLIC PANEL
Anion gap: 12 (ref 5–15)
BUN: 11 mg/dL (ref 6–20)
CO2: 22 mmol/L (ref 22–32)
Calcium: 9.9 mg/dL (ref 8.9–10.3)
Chloride: 108 mmol/L (ref 98–111)
Creatinine, Ser: 1.4 mg/dL — ABNORMAL HIGH (ref 0.44–1.00)
GFR, Estimated: 50 mL/min — ABNORMAL LOW (ref >60)
Glucose, Bld: 108 mg/dL — ABNORMAL HIGH (ref 70–99)
Potassium: 3.4 mmol/L — ABNORMAL LOW (ref 3.5–5.1)
Sodium: 142 mmol/L (ref 135–145)

## 2021-10-05 LAB — I-STAT BETA HCG BLOOD, ED (MC, WL, AP ONLY): I-stat hCG, quantitative: <5 m[IU]/mL (ref <5)

## 2021-10-05 LAB — CBC
HCT: 39.5 % (ref 36.0–46.0)
Hemoglobin: 13.2 g/dL (ref 12.0–15.0)
MCH: 28.8 pg (ref 26.0–34.0)
MCHC: 33.4 g/dL (ref 30.0–36.0)
MCV: 86.2 fL (ref 80.0–100.0)
Platelets: 412 10*3/uL — ABNORMAL HIGH (ref 150–400)
RBC: 4.58 MIL/uL (ref 3.87–5.11)
RDW: 14.2 % (ref 11.5–15.5)
WBC: 15.6 10*3/uL — ABNORMAL HIGH (ref 4.0–10.5)
nRBC: 0 % (ref 0.0–0.2)

## 2021-10-05 LAB — TROPONIN I (HIGH SENSITIVITY): Troponin I (High Sensitivity): 3 ng/L (ref <18)

## 2021-10-05 MED ORDER — ALBUTEROL SULFATE HFA 108 (90 BASE) MCG/ACT IN AERS
1.0000 | INHALATION_SPRAY | Freq: Once | RESPIRATORY_TRACT | Status: AC
  Administered 2021-10-05: 1 via RESPIRATORY_TRACT
  Filled 2021-10-05: qty 6.7

## 2021-10-05 MED ORDER — ONDANSETRON 4 MG PO TBDP
4.0000 mg | ORAL_TABLET | Freq: Once | ORAL | Status: AC
  Administered 2021-10-05: 4 mg via ORAL
  Filled 2021-10-05: qty 1

## 2021-10-05 MED ORDER — SUMATRIPTAN SUCCINATE 6 MG/0.5ML ~~LOC~~ SOLN
6.0000 mg | Freq: Once | SUBCUTANEOUS | Status: AC
  Administered 2021-10-05: 6 mg via SUBCUTANEOUS
  Filled 2021-10-05: qty 0.5

## 2021-10-05 NOTE — ED Provider Notes (Addendum)
[facility]-EMERGENCY DEPT Provider Note   CSN: [id number] Arrival date & time: 10/05/21  1923     History  Chief Complaint  Patient presents with   Panic Attack    Yvonne [name] is a 36 y.o. female.  HPI  Patient with medical history of anxiety, MDD, known murmur, chronic migraines followed by neurology presents today due to migraine and chest pain.  Patient states she has been having migraine for the last 4 days, was seen in urgent care and given sumatriptan and Decadron which improved the headache.  Headache returned today, she felt really short of breath and had chest pain earlier today.  EMS was called, brought her to ED for further evaluation.  Patient denies any current chest pain, she states it feels like previous panic attack she said.  The pain was sharp, to the right side of her chest and did not radiate elsewhere.  She has been having nausea and vomiting which has been attributed to the migraine, denies any abdominal pain.  Additional social history note, patient states she lost her sister unexpectedly recently.  She is from [place], not able to go home until September of this year.  This is been causing increased draining grief for the patient.  She denies any SI or HI.  Home Medications Prior to Admission medications   Medication Sig Start Date End Date Taking? Authorizing Provider  aluminum-magnesium hydroxide 200-200 MG/5ML suspension Take 15 mLs by mouth every 6 (six) hours as needed for indigestion. 08/09/20   White, Elita Boone, NP  buPROPion (WELLBUTRIN XL) 150 MG 24 hr tablet Take 150 mg by mouth daily. 06/30/21   [provider]  docusate sodium (COLACE) 250 MG capsule Take 1 capsule (250 mg total) by mouth daily. 10/18/20   Ivette Loyal, NP  escitalopram (LEXAPRO) 10 MG tablet Take 10 mg by mouth daily. 02/03/21   [provider]  famotidine (PEPCID) 20 MG tablet Take 1 tablet (20 mg total) by mouth 2 (two) times daily.  08/09/20   Valinda Hoar, NP  hydrOXYzine (ATARAX) 50 MG tablet Take 50 mg by mouth 3 (three) times daily. 06/30/21   [provider]  ibuprofen (ADVIL) 800 MG tablet Take 1 tablet (800 mg total) by mouth every 8 (eight) hours as needed. 09/25/21   Kerrin Champagne, MD  levothyroxine (SYNTHROID) 150 MCG tablet Take by mouth. 08/15/19   [provider]  meloxicam (MOBIC) 15 MG tablet Take 1 tablet (15 mg total) by mouth daily. 02/22/21   Louann Sjogren, DPM  nortriptyline (PAMELOR) 10 MG capsule 10 mg. 08/31/20   [provider]  ondansetron (ZOFRAN) 4 MG tablet Take 1 tablet (4 mg total) by mouth every 6 (six) hours. 08/09/20   White, Elita Boone, NP  rizatriptan (MAXALT) 10 MG tablet Take 10 mg by mouth. 07/12/20   [provider]  SUMAtriptan (IMITREX) 50 MG tablet Take 1 tablet (50 mg total) by mouth once for 1 dose. May repeat in 2 hours if headache persists or recurs. 09/28/21 09/28/21  Merrilee Jansky, MD      Allergies    Aspirin, Bee venom, Metoclopramide, Reglan [metoclopramide hcl], and Metronidazole    Review of Systems   Review of Systems  Physical Exam Updated Vital Signs BP 114/82   Pulse 61   Temp 97.6 F (36.4 C) (Oral)   Resp 16   Ht 5' (1.524 m)   Wt 106.1 kg   LMP 07/25/2012  SpO2 98%   BMI 45.70 kg/m  Physical Exam Vitals and nursing note reviewed. Exam conducted with a chaperone present.  Constitutional:      Appearance: Normal appearance.  HENT:     Head: Normocephalic and atraumatic.  Eyes:     General: No scleral icterus.       Right eye: No discharge.        Left eye: No discharge.     Extraocular Movements: Extraocular movements intact.     Pupils: Pupils are equal, round, and reactive to light.  Cardiovascular:     Rate and Rhythm: Normal rate and regular rhythm.     Pulses: Normal pulses.     Heart sounds: Murmur heard.     No friction rub. No gallop.  Pulmonary:     Effort: Pulmonary effort is normal. No  respiratory distress.     Breath sounds: Normal breath sounds.  Abdominal:     General: Abdomen is flat. Bowel sounds are normal. There is no distension.     Palpations: Abdomen is soft.     Tenderness: There is no abdominal tenderness.  Skin:    General: Skin is warm and dry.     Coloration: Skin is not jaundiced.  Neurological:     Mental Status: She is alert. Mental status is at baseline.     Coordination: Coordination normal.     Comments: Cranial nerves III through XII are grossly intact, grip strength equal bilaterally.  Lower extremity strength is equal bilaterally.  EOMI     ED Results / Procedures / Treatments   Labs (all labs ordered are listed, but only abnormal results are displayed) Labs Reviewed  CBC - Abnormal; Notable for the following components:      Result Value   WBC 15.6 (*)    Platelets 412 (*)    All other components within normal limits  BASIC METABOLIC PANEL - Abnormal; Notable for the following components:   Potassium 3.4 (*)    Glucose, Bld 108 (*)    Creatinine, Ser 1.40 (*)    GFR, Estimated 50 (*)    All other components within normal limits  I-STAT BETA HCG BLOOD, ED ([facility], [facility], [facility] ONLY)  TROPONIN I (HIGH SENSITIVITY)    EKG None  Radiology DG Chest 2 View  Result Date: 10/05/2021 CLINICAL DATA:  Migraine headache, anxiety attack, chest pain EXAM: CHEST - 2 VIEW COMPARISON:  10/29/2017 FINDINGS: The heart size and mediastinal contours are within normal limits. Both lungs are clear. The visualized skeletal structures are unremarkable. IMPRESSION: No active cardiopulmonary disease. Electronically Signed   By: Sharlet Salina M.D.   On: 10/05/2021 20:07    Procedures Procedures    Medications Ordered in ED Medications  ondansetron (ZOFRAN-ODT) disintegrating tablet 4 mg (has no administration in time range)  SUMAtriptan (IMITREX) injection 6 mg (has no administration in time range)  albuterol (VENTOLIN HFA) 108 (90 Base) MCG/ACT inhaler 1 puff  (has no administration in time range)    ED Course/ Medical Decision Making/ A&P Clinical Course as of 10/05/21 2210  Thu Oct 05, 2021  2205 On reevaluation patient requests albuterol refill because she is going out of medicine.  She denies any current shortness of breath or wheezing. [HS]  2205 Basic metabolic panel(!) Patient has a slight AKI which I attribute to the nausea and vomiting over the last few days that she has been having a headache.  She is tolerating p.o. at this point.  There is no  gross electrolyte derangement although she is mildly hypokalemic at 3.4 [HS]  2205 CBC(!) Patient has a slight leukocytosis of 15.6, she was given Decadron in the urgent care yesterday which could be contributing to this. [HS]  2205 Troponin I (High Sensitivity) Negative troponin, I doubt this is ACS [HS]  2206 I-Stat Beta hCG blood, ED ([facility], [facility], [facility] only) Patient is not pregnant [HS]  2206 DG Chest 2 View I viewed the chest x-ray which is negative for any acute process based on interpretation.  I agree with radiologist interpretation [HS]  2206 ED EKG No specific EKG changes or underlying arrhythmia noted. [HS]    Clinical Course User Index [HS] Theron Arista, PA-C                           Medical Decision Making Amount and/or Complexity of Data Reviewed Labs: ordered. Decision-making details documented in ED Course. Radiology: ordered. Decision-making details documented in ED Course. ECG/medicine tests:  Decision-making details documented in ED Course.  Risk Prescription drug management.   Patient presents for chest pain and migraine.  Differential includes but not limited to ACS, PE, pneumonia, pericarditis, pneumothorax, esophageal rupture.  Migraine could be acute on chronic migraine, alternative etiologies also considered including but not limited to [facility], TIA, CVA.  On exam she has no focal neurodeficits, her vital signs are stable and she is not febrile.  Overall reassuring, there  is no hypoxia and lungs are clear to auscultation.  ED course documents interpretation of laboratory work-up and imaging as pertaining above.  I did review external records and patient's medication list.  She has had anaphylaxis rash reactions to Reglan in the past so we will obtain from that.  Patient also states she cannot takes any anti-inflammatory due to history of gastric ulcer.  We will give sumatriptan as that is been proven to help in the past.  I do not think the chest pain is related to ACS, PE, pneumonia or any other acute process.  It could be related to anxiety although alternative etiologies were also considered.  Given she has been stable, negative troponin and no EKG changes I do not think additional cardiac work-up is indicated at this time.  At this time I feel patient is appropriate for outpatient follow-up from neurology, she has an appointment 10/20/2021 which I encouraged her to keep.  We discussed return precautions, discharged in stable condition.        Final Clinical Impression(s) / ED Diagnoses Final diagnoses:  None    Rx / DC Orders ED Discharge Orders     None         Theron Arista, PA-C 10/05/21 2210    Rolan Bucco, MD 10/05/21 2210    Theron Arista, PA-C 10/05/21 2211    Rolan Bucco, MD 10/05/21 2239

## 2021-10-05 NOTE — Discharge Instructions (Addendum)
Follow-up with your neurologist as planned.  Return to the ED for new or worsening symptoms.

## 2021-10-05 NOTE — ED Triage Notes (Addendum)
Pt BIBEMS with c/o migraine ongoing all day. Pt states she had an anxiety attack prior to EMS arrival and reported chest pain with SOB upon their arrival, hx anxiety pt endorses multiple new stressors as of lately.

## 2021-10-05 NOTE — ED Provider Triage Note (Signed)
Emergency Medicine Provider Triage Evaluation Note  Yvonne [name] , a 36 y.o. female  was evaluated in triage.  Pt complains of migraine.  Is been going on for the last few days.  She is also having chest pain that started today.  Under a lot of stress recently, family members died in her in [place]..  Review of Systems  Per hpi  Physical Exam  BP (!) 159/99 (BP Location: Left Arm)   Pulse 72   Temp 97.6 F (36.4 C) (Oral)   Resp 19   Ht 5' (1.524 m)   Wt 106.1 kg   LMP 07/25/2012   SpO2 100%   BMI 45.70 kg/m  Gen:   Awake, no distress   Resp:  Normal effort  MSK:   Moves extremities without difficulty  Other:    Medical Decision Making  Medically screening exam initiated at 7:53 PM.  Appropriate orders placed.  ZAKKIYYA BARNO was informed that the remainder of the evaluation will be completed by another provider, this initial triage assessment does not replace that evaluation, and the importance of remaining in the ED until their evaluation is complete.     Theron Arista, PA-C 10/05/21 1953

## 2021-10-09 DIAGNOSIS — F329 Major depressive disorder, single episode, unspecified: Secondary | ICD-10-CM | POA: Diagnosis not present

## 2021-10-10 ENCOUNTER — Ambulatory Visit (HOSPITAL_COMMUNITY)
Admission: EM | Admit: 2021-10-10 | Discharge: 2021-10-10 | Disposition: A | Discharge status: Home / Self Care | Payer: Medicaid Other

## 2021-10-11 DIAGNOSIS — N898 Other specified noninflammatory disorders of vagina: Secondary | ICD-10-CM | POA: Diagnosis not present

## 2021-10-16 DIAGNOSIS — M545 Low back pain, unspecified: Secondary | ICD-10-CM | POA: Diagnosis not present

## 2021-10-16 DIAGNOSIS — G8929 Other chronic pain: Secondary | ICD-10-CM | POA: Diagnosis not present

## 2021-10-16 DIAGNOSIS — Z6841 Body Mass Index (BMI) 40.0 and over, adult: Secondary | ICD-10-CM | POA: Diagnosis not present

## 2021-10-16 DIAGNOSIS — Z0130 Encounter for examination of blood pressure without abnormal findings: Secondary | ICD-10-CM | POA: Diagnosis not present

## 2021-10-16 DIAGNOSIS — Z131 Encounter for screening for diabetes mellitus: Secondary | ICD-10-CM | POA: Diagnosis not present

## 2021-10-16 DIAGNOSIS — Z79899 Other long term (current) drug therapy: Secondary | ICD-10-CM | POA: Diagnosis not present

## 2021-10-23 DIAGNOSIS — R7303 Prediabetes: Secondary | ICD-10-CM | POA: Diagnosis not present

## 2021-10-23 DIAGNOSIS — M545 Low back pain, unspecified: Secondary | ICD-10-CM | POA: Diagnosis not present

## 2021-10-23 DIAGNOSIS — Z6841 Body Mass Index (BMI) 40.0 and over, adult: Secondary | ICD-10-CM | POA: Diagnosis not present

## 2021-10-23 DIAGNOSIS — Z79899 Other long term (current) drug therapy: Secondary | ICD-10-CM | POA: Diagnosis not present

## 2021-10-23 DIAGNOSIS — Z0130 Encounter for examination of blood pressure without abnormal findings: Secondary | ICD-10-CM | POA: Diagnosis not present

## 2021-10-23 DIAGNOSIS — F419 Anxiety disorder, unspecified: Secondary | ICD-10-CM | POA: Diagnosis not present

## 2021-10-23 DIAGNOSIS — R7401 Elevation of levels of liver transaminase levels: Secondary | ICD-10-CM | POA: Diagnosis not present

## 2021-10-23 DIAGNOSIS — G8929 Other chronic pain: Secondary | ICD-10-CM | POA: Diagnosis not present

## 2021-10-26 DIAGNOSIS — R7401 Elevation of levels of liver transaminase levels: Secondary | ICD-10-CM | POA: Diagnosis not present

## 2021-10-27 DIAGNOSIS — G43109 Migraine with aura, not intractable, without status migrainosus: Secondary | ICD-10-CM | POA: Diagnosis not present

## 2021-10-31 DIAGNOSIS — Z419 Encounter for procedure for purposes other than remedying health state, unspecified: Secondary | ICD-10-CM | POA: Diagnosis not present

## 2021-11-10 ENCOUNTER — Ambulatory Visit (HOSPITAL_COMMUNITY)
Admission: EM | Admit: 2021-11-10 | Discharge: 2021-11-10 | Disposition: A | Discharge status: Home / Self Care | Payer: Medicaid Other | Attending: Family | Admitting: Family

## 2021-11-10 ENCOUNTER — Encounter (HOSPITAL_COMMUNITY): Payer: Self-pay | Admitting: *Deleted

## 2021-11-10 ENCOUNTER — Other Ambulatory Visit: Payer: Self-pay

## 2021-11-10 DIAGNOSIS — N761 Subacute and chronic vaginitis: Secondary | ICD-10-CM | POA: Insufficient documentation

## 2021-11-10 MED ORDER — METRONIDAZOLE 0.75 % VA GEL: 1.0000 | Freq: Every day | VAGINAL | 1 refills | Status: AC

## 2021-11-10 MED ORDER — FLUCONAZOLE 150 MG PO TABS: 150.0000 mg | ORAL_TABLET | Freq: Once | ORAL | 0 refills | Status: AC

## 2021-11-10 NOTE — ED Triage Notes (Signed)
Pt reports for 3 days she has had a vag discharge with odor. P thas frequent BV infections.

## 2021-11-10 NOTE — ED Provider Notes (Signed)
[facility]    CSN: [id number] Arrival date & time: 11/10/21  7741      History   Chief Complaint Chief Complaint  Patient presents with   Vaginal Discharge    HPI Yvonne [name] is a 36 y.o. female.   36 year old female presents with unusual vaginal discharge with odor. 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 She has seen her PCP in the past and has been referred to her OB/GYN for further evaluation and preventative treatment but has been unable to go due to work schedule. Last episode of BV dx at her PCP was about 1 month ago on 10/11/2021. She was positive for BV and negative for yeast but often gets a yeast infection with treatment from BV. She has successfully used Metrogel in the past- unable to tolerate oral Flagyl. She denies any fever, abdominal pain, pelvic pain, dysuria or hematuria. She declines STD testing. Other chronic health issues include thyroid disorder, migraine headaches, chronic back pain S/P lumbar fusion and anxiety. Currently on Synthroid, Nortriptyline, Wellbutrin and Oxycodone daily and Ibuprofen and Imitrex prn.   The history is provided by the patient.    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 - tx w/otc med   Asthma    albuterol inhaler   Blood transfusion    2008 with c/s 4 units transfused   Carpal tunnel syndrome on right    Chronic back pain    Depression    Dyspnea    exertion   Headache(784.0)    otc ibuprofen 800mg    Heart murmur    as a child   Obesity    Pneumonia    as a child   Thyroid disease     Patient Active Problem List   Diagnosis Date Noted   S/P lumbar spinal fusion 02/13/2017   Herniated nucleus pulposus, lumbar 04/10/2016   HNP (herniated nucleus pulposus), lumbar 11/05/2014    Class: Chronic   S/P endometrial ablation - HTA 2012 06/03/2012   Sterilization - h/o BTL 06/03/2012    OBESITY 09/03/2007   MIGRAINE HEADACHE 09/03/2007    Past Surgical History:  Procedure Laterality Date   ABDOMINAL HYSTERECTOMY N/A 09/29/2012   Procedure: HYSTERECTOMY ABDOMINAL;  Surgeon: 10/01/2012, MD;  Location: [facility];  Service: Gynecology;  Laterality: N/A;   APPENDECTOMY     BACK SURGERY     BILATERAL SALPINGECTOMY Bilateral 09/29/2012   Procedure: BILATERAL SALPINGECTOMY;  Surgeon: 10/01/2012, MD;  Location: [facility];  Service: Gynecology;  Laterality: Bilateral;   CESAREAN SECTION     CHOLECYSTECTOMY     CYSTOSCOPY Bilateral 09/29/2012   Procedure: CYSTOSCOPY;  Surgeon: 10/01/2012, MD;  Location: [facility];  Service: Gynecology;  Laterality: Bilateral;   ENDOMETRIAL ABLATION     EXAMINATION UNDER ANESTHESIA N/A 10/16/2012   Procedure: EXAM UNDER ANESTHESIA;  Surgeon: 10/18/2012, MD;  Location: [facility];  Service: Gynecology;  Laterality: N/A;  Suturing Vaginal Cuff   HAND SURGERY Right    carpal tunnel   LAPAROSCOPIC ASSISTED VAGINAL HYSTERECTOMY N/A 09/29/2012   Procedure: LAPAROSCOPIC ASSISTED VAGINAL HYSTERECTOMY;  Surgeon: 10/01/2012, MD;  Location: [facility];  Service: Gynecology;  Laterality: N/A;   LAPAROSCOPIC HYSTERECTOMY Bilateral 09/29/2012   Procedure: HYSTERECTOMY TOTAL LAPAROSCOPIC;  Surgeon: 10/01/2012,  MD;  Location: [facility];  Service: Gynecology;  Laterality: Bilateral;  Total Laparoscopic Hysterectomy, Bilateral Salpingectomies, Cystoscopy poss. LAVH poss. TAH   LAPAROSCOPY     x3   LUMBAR LAMINECTOMY N/A 11/05/2014   Procedure: RIGHT L4-5 MICRODISCECTOMY ;  Surgeon: Jessy Oto, MD;  Location: [place];  Service: Orthopedics;  Laterality: N/A;   LUMBAR LAMINECTOMY/DECOMPRESSION MICRODISCECTOMY N/A 04/10/2016   Procedure: Right L4-5 Microdiscectomy;  Surgeon: Jessy Oto, MD;  Location: [place];  Service: Orthopedics;  Laterality: N/A;   THYROIDECTOMY     THYROIDECTOMY     TONSILLECTOMY     T & A   TRANSFORAMINAL LUMBAR INTERBODY FUSION  (TLIF) WITH PEDICLE SCREW FIXATION 1 LEVEL N/A 02/13/2017   Procedure: Transforaminal Lumbar Interbody Fusion - Lumbar four-five;  Surgeon: Eustace Moore, MD;  Location: [place];  Service: Neurosurgery;  Laterality: N/A;   TUBAL LIGATION      OB History     Gravida  4   Para  4   Term  4   Preterm      AB      Living  1      SAB      IAB      Ectopic      Multiple      Live Births  1            Home Medications    Prior to Admission medications   Medication Sig Start Date End Date Taking? Authorizing Provider  fluconazole (DIFLUCAN) 150 MG tablet Take 1 tablet (150 mg total) by mouth once for 1 dose. May repeat 1 tablet in 4 to 5 days if needed. 11/10/21 11/10/21 Yes Charleton Deyoung, Nicholes Stairs, NP  metroNIDAZOLE (METROGEL VAGINAL) 0.75 % vaginal gel Place 1 Applicatorful vaginally at bedtime for 5 days. 11/10/21 11/15/21 Yes Hjalmer Iovino, Nicholes Stairs, NP  buPROPion (WELLBUTRIN XL) 150 MG 24 hr tablet Take 150 mg by mouth daily. 06/30/21   [provider]  hydrOXYzine (ATARAX) 50 MG tablet Take 50 mg by mouth 3 (three) times daily. 06/30/21   [provider]  ibuprofen (ADVIL) 800 MG tablet Take 1 tablet (800 mg total) by mouth every 8 (eight) hours as needed. 09/25/21   Jessy Oto, MD  levothyroxine (SYNTHROID) 150 MCG tablet Take by mouth. 08/15/19   [provider]  nortriptyline (PAMELOR) 10 MG capsule 10 mg. 08/31/20   [provider]  SUMAtriptan (IMITREX) 50 MG tablet Take 1 tablet (50 mg total) by mouth once for 1 dose. May repeat in 2 hours if headache persists or recurs. 09/28/21 09/28/21  LampteyMyrene Galas, MD    Family History Family History  Problem Relation Age of Onset   Diabetes Mother    Diabetes Maternal Grandmother    Stroke Father     Social History Social History   Tobacco Use   Smoking status: Every Day    Packs/day: 1.00    Types: Cigarettes    Last attempt to quit: 10/17/2015    Years since quitting: 6.0   Smokeless  tobacco: Never  Vaping Use   Vaping Use: Some days  Substance Use Topics   Alcohol use: Yes    Alcohol/week: 1.0 standard drink of alcohol    Types: 1 Glasses of wine per week    Comment: ocassionally   Drug use: No     Allergies   Aspirin, Bee venom, Metoclopramide, Reglan [metoclopramide hcl], and Metronidazole   Review of Systems Review of Systems  Constitutional:  Negative for activity change, appetite change, chills, fatigue and fever.  HENT:  Negative for mouth sores and sore throat.   Gastrointestinal:  Negative for abdominal pain, nausea and vomiting.  Genitourinary:  Positive for vaginal discharge. Negative for difficulty urinating, dysuria, frequency, hematuria, pelvic pain and vaginal pain.  Musculoskeletal:  Positive for back pain (chronic). Negative for neck pain and neck stiffness.  Skin:  Negative for color change and rash.  Allergic/Immunologic: Positive for environmental allergies. Negative for food allergies and immunocompromised state.  Neurological:  Positive for headaches. Negative for dizziness, tremors, seizures, syncope, speech difficulty and numbness.  Hematological:  Negative for adenopathy. Does not bruise/bleed easily.     Physical Exam Triage Vital Signs ED Triage Vitals  Enc Vitals Group     BP 11/10/21 0840 137/86     Pulse Rate 11/10/21 0840 87     Resp 11/10/21 0840 20     Temp 11/10/21 0840 98.5 F (36.9 C)     Temp src --      SpO2 11/10/21 0840 99 %     Weight --      Height --      Head Circumference --      Peak Flow --      Pain Score 11/10/21 0838 0     Pain Loc --      Pain Edu? --      Excl. in GC? --    No data found.  Updated Vital Signs BP 137/86   Pulse 87   Temp 98.5 F (36.9 C)   Resp 20   LMP 07/25/2012   SpO2 99%   Visual Acuity Right Eye Distance:   Left Eye Distance:   Bilateral Distance:    Right Eye Near:   Left Eye Near:    Bilateral Near:     Physical Exam Vitals and nursing note reviewed.   Constitutional:      General: She is awake. She is not in acute distress.    Appearance: She is well-developed and well-groomed.     Comments: She is sitting comfortably in the exam chair in no acute distress.   HENT:     Head: Normocephalic and atraumatic.     Right Ear: Hearing normal.     Left Ear: Hearing normal.  Eyes:     Extraocular Movements: Extraocular movements intact.     Conjunctiva/sclera: Conjunctivae normal.  Cardiovascular:     Rate and Rhythm: Normal rate and regular rhythm.     Heart sounds: Normal heart sounds. No murmur heard. Pulmonary:     Effort: Pulmonary effort is normal. No respiratory distress.     Breath sounds: Normal breath sounds and air entry.  Abdominal:     General: Bowel sounds are normal.     Palpations: Abdomen is soft.     Tenderness: There is no abdominal tenderness. There is no right CVA tenderness, left CVA tenderness, guarding or rebound.  Genitourinary:    Comments: Patient declines pelvic exam- obtained vaginal self-swab for testing.  Skin:    General: Skin is warm and dry.     Findings: No rash.  Neurological:     General: No focal deficit present.     Mental Status: She is alert and oriented to person, place, and time.  Psychiatric:        Mood and Affect: Mood normal.        Behavior: Behavior normal. Behavior is cooperative.  Thought Content: Thought content normal.        Judgment: Judgment normal.      UC Treatments / Results  Labs (all labs ordered are listed, but only abnormal results are displayed) Labs Reviewed  CERVICOVAGINAL ANCILLARY ONLY    EKG   Radiology No results found.  Procedures Procedures (including critical care time)  Medications Ordered in UC Medications - No data to display  Initial Impression / Assessment and Plan / UC Course  I have reviewed the triage vital signs and the nursing notes.  Pertinent labs & imaging results that were available during my care of the patient were  reviewed by me and considered in my medical decision making (see chart for details).     Reviewed with patient that symptoms and history are typical of BV and will treat with Metrogel once daily for 5 days. May start Diflucan 150mg  one tablet today, may repeat in 4 to 5 days if needed. Recommend contact her PCP and OB/GYN about scheduling an appointment for further evaluation of recurrent BV. Follow-up also pending lab results.  Final Clinical Impressions(s) / UC Diagnoses   Final diagnoses:  Chronic vaginitis     Discharge Instructions      Recommend restart Metrogel- one applicatorful once daily for 5 days for probable recurrent BV. Recommend start Diflucan 150mg  one tablet today, may repeat 1 tablet in 4 to 5 days if needed. Continue to contact your PCP and your OB/GYN about discussing long term treatment and prevention of recurrent BV. Follow-up pending lab results.     ED Prescriptions     Medication Sig Dispense Auth. Provider   metroNIDAZOLE (METROGEL VAGINAL) 0.75 % vaginal gel Place 1 Applicatorful vaginally at bedtime for 5 days. 70 g Katy Apo, NP   fluconazole (DIFLUCAN) 150 MG tablet Take 1 tablet (150 mg total) by mouth once for 1 dose. May repeat 1 tablet in 4 to 5 days if needed. 2 tablet Katy Apo, NP      PDMP was reviewed this encounter to confirm oxycodone usage since not listed in current medications and confirm no other controlled medications that may interfere with antifungal medication.    Katy Apo, NP 11/10/21 [phone]

## 2021-11-10 NOTE — Discharge Instructions (Addendum)
Recommend restart Metrogel- one applicatorful once daily for 5 days for probable recurrent BV. Recommend start Diflucan 150mg  one tablet today, may repeat 1 tablet in 4 to 5 days if needed. Continue to contact your PCP and your OB/GYN about discussing long term treatment and prevention of recurrent BV. Follow-up pending lab results.

## 2021-11-13 LAB — CERVICOVAGINAL ANCILLARY ONLY
Bacterial Vaginitis (gardnerella): POSITIVE — AB
Candida Glabrata: NEGATIVE
Candida Vaginitis: POSITIVE — AB
Comment: NEGATIVE
Comment: NEGATIVE
Comment: NEGATIVE
Comment: NEGATIVE
Trichomonas: NEGATIVE

## 2021-11-20 DIAGNOSIS — G8929 Other chronic pain: Secondary | ICD-10-CM | POA: Diagnosis not present

## 2021-11-20 DIAGNOSIS — Z79899 Other long term (current) drug therapy: Secondary | ICD-10-CM | POA: Diagnosis not present

## 2021-11-20 DIAGNOSIS — M545 Low back pain, unspecified: Secondary | ICD-10-CM | POA: Diagnosis not present

## 2021-11-20 DIAGNOSIS — R7303 Prediabetes: Secondary | ICD-10-CM | POA: Diagnosis not present

## 2021-11-20 DIAGNOSIS — R7401 Elevation of levels of liver transaminase levels: Secondary | ICD-10-CM | POA: Diagnosis not present

## 2021-11-20 DIAGNOSIS — Z0130 Encounter for examination of blood pressure without abnormal findings: Secondary | ICD-10-CM | POA: Diagnosis not present

## 2021-11-20 DIAGNOSIS — Z6841 Body Mass Index (BMI) 40.0 and over, adult: Secondary | ICD-10-CM | POA: Diagnosis not present

## 2021-11-20 DIAGNOSIS — E039 Hypothyroidism, unspecified: Secondary | ICD-10-CM | POA: Diagnosis not present

## 2021-11-24 DIAGNOSIS — F329 Major depressive disorder, single episode, unspecified: Secondary | ICD-10-CM | POA: Diagnosis not present

## 2021-11-27 DIAGNOSIS — M79603 Pain in arm, unspecified: Secondary | ICD-10-CM | POA: Diagnosis not present

## 2021-11-27 DIAGNOSIS — M25832 Other specified joint disorders, left wrist: Secondary | ICD-10-CM | POA: Diagnosis not present

## 2021-12-01 DIAGNOSIS — Z419 Encounter for procedure for purposes other than remedying health state, unspecified: Secondary | ICD-10-CM | POA: Diagnosis not present

## 2021-12-18 DIAGNOSIS — F329 Major depressive disorder, single episode, unspecified: Secondary | ICD-10-CM | POA: Diagnosis not present

## 2021-12-19 DIAGNOSIS — M79603 Pain in arm, unspecified: Secondary | ICD-10-CM | POA: Diagnosis not present

## 2021-12-19 DIAGNOSIS — M79641 Pain in right hand: Secondary | ICD-10-CM | POA: Diagnosis not present

## 2021-12-21 DIAGNOSIS — Z789 Other specified health status: Secondary | ICD-10-CM | POA: Diagnosis not present

## 2021-12-21 DIAGNOSIS — Z6841 Body Mass Index (BMI) 40.0 and over, adult: Secondary | ICD-10-CM | POA: Diagnosis not present

## 2021-12-21 DIAGNOSIS — E039 Hypothyroidism, unspecified: Secondary | ICD-10-CM | POA: Diagnosis not present

## 2021-12-21 DIAGNOSIS — G8929 Other chronic pain: Secondary | ICD-10-CM | POA: Diagnosis not present

## 2021-12-21 DIAGNOSIS — Z0130 Encounter for examination of blood pressure without abnormal findings: Secondary | ICD-10-CM | POA: Diagnosis not present

## 2021-12-21 DIAGNOSIS — Z79899 Other long term (current) drug therapy: Secondary | ICD-10-CM | POA: Diagnosis not present

## 2021-12-21 DIAGNOSIS — R7303 Prediabetes: Secondary | ICD-10-CM | POA: Diagnosis not present

## 2021-12-21 DIAGNOSIS — M545 Low back pain, unspecified: Secondary | ICD-10-CM | POA: Diagnosis not present

## 2021-12-21 DIAGNOSIS — R7401 Elevation of levels of liver transaminase levels: Secondary | ICD-10-CM | POA: Diagnosis not present

## 2021-12-28 DIAGNOSIS — G5622 Lesion of ulnar nerve, left upper limb: Secondary | ICD-10-CM | POA: Diagnosis not present

## 2021-12-28 DIAGNOSIS — G5602 Carpal tunnel syndrome, left upper limb: Secondary | ICD-10-CM | POA: Diagnosis not present

## 2021-12-31 ENCOUNTER — Other Ambulatory Visit: Payer: Self-pay

## 2021-12-31 ENCOUNTER — Emergency Department (HOSPITAL_COMMUNITY)
Admission: EM | Admit: 2021-12-31 | Discharge: 2021-12-31 | Disposition: A | Discharge status: Home / Self Care | Payer: Medicaid Other | Attending: Emergency Medicine | Admitting: Emergency Medicine

## 2021-12-31 ENCOUNTER — Emergency Department (HOSPITAL_COMMUNITY): Payer: Medicaid Other

## 2021-12-31 ENCOUNTER — Encounter (HOSPITAL_COMMUNITY): Payer: Self-pay

## 2021-12-31 DIAGNOSIS — K5900 Constipation, unspecified: Secondary | ICD-10-CM | POA: Diagnosis not present

## 2021-12-31 DIAGNOSIS — Z9049 Acquired absence of other specified parts of digestive tract: Secondary | ICD-10-CM | POA: Diagnosis not present

## 2021-12-31 DIAGNOSIS — D72829 Elevated white blood cell count, unspecified: Secondary | ICD-10-CM | POA: Insufficient documentation

## 2021-12-31 DIAGNOSIS — K3189 Other diseases of stomach and duodenum: Secondary | ICD-10-CM | POA: Diagnosis not present

## 2021-12-31 DIAGNOSIS — Z419 Encounter for procedure for purposes other than remedying health state, unspecified: Secondary | ICD-10-CM | POA: Diagnosis not present

## 2021-12-31 DIAGNOSIS — K6389 Other specified diseases of intestine: Secondary | ICD-10-CM | POA: Diagnosis not present

## 2021-12-31 LAB — COMPREHENSIVE METABOLIC PANEL
ALT: 27 U/L (ref 0–44)
AST: 26 U/L (ref 15–41)
Albumin: 3.6 g/dL (ref 3.5–5.0)
Alkaline Phosphatase: 117 U/L (ref 38–126)
Anion gap: 11 (ref 5–15)
BUN: 8 mg/dL (ref 6–20)
CO2: 27 mmol/L (ref 22–32)
Calcium: 9.6 mg/dL (ref 8.9–10.3)
Chloride: 102 mmol/L (ref 98–111)
Creatinine, Ser: 1.04 mg/dL — ABNORMAL HIGH (ref 0.44–1.00)
GFR, Estimated: >60 mL/min (ref >60)
Glucose, Bld: 105 mg/dL — ABNORMAL HIGH (ref 70–99)
Potassium: 4 mmol/L (ref 3.5–5.1)
Sodium: 140 mmol/L (ref 135–145)
Total Bilirubin: 0.4 mg/dL (ref 0.3–1.2)
Total Protein: 6.7 g/dL (ref 6.5–8.1)

## 2021-12-31 LAB — CBC WITH DIFFERENTIAL/PLATELET
Abs Immature Granulocytes: 0.05 10*3/uL (ref 0.00–0.07)
Basophils Absolute: 0 10*3/uL (ref 0.0–0.1)
Basophils Relative: 0 %
Eosinophils Absolute: 0.2 10*3/uL (ref 0.0–0.5)
Eosinophils Relative: 2 %
HCT: 38.2 % (ref 36.0–46.0)
Hemoglobin: 12.3 g/dL (ref 12.0–15.0)
Immature Granulocytes: 0 %
Lymphocytes Relative: 23 %
Lymphs Abs: 3 10*3/uL (ref 0.7–4.0)
MCH: 28.1 pg (ref 26.0–34.0)
MCHC: 32.2 g/dL (ref 30.0–36.0)
MCV: 87.4 fL (ref 80.0–100.0)
Monocytes Absolute: 0.9 10*3/uL (ref 0.1–1.0)
Monocytes Relative: 7 %
Neutro Abs: 9.2 10*3/uL — ABNORMAL HIGH (ref 1.7–7.7)
Neutrophils Relative %: 68 %
Platelets: 341 10*3/uL (ref 150–400)
RBC: 4.37 MIL/uL (ref 3.87–5.11)
RDW: 13.6 % (ref 11.5–15.5)
WBC: 13.4 10*3/uL — ABNORMAL HIGH (ref 4.0–10.5)
nRBC: 0 % (ref 0.0–0.2)

## 2021-12-31 LAB — LIPASE, BLOOD: Lipase: 32 U/L (ref 11–51)

## 2021-12-31 MED ORDER — IOHEXOL 350 MG/ML SOLN
75.0000 mL | Freq: Once | INTRAVENOUS | Status: AC | PRN
  Administered 2021-12-31: 75 mL via INTRAVENOUS

## 2021-12-31 MED ORDER — POLYETHYLENE GLYCOL 3350 17 G PO PACK: 17.0000 g | PACK | Freq: Every day | ORAL | 2 refills | Status: DC

## 2021-12-31 MED ORDER — MAGNESIUM CITRATE PO SOLN: 1.0000 | Freq: Once | ORAL | 0 refills | Status: AC

## 2021-12-31 MED ORDER — SODIUM CHLORIDE 0.9 % IV SOLN: INTRAVENOUS | Status: DC

## 2021-12-31 NOTE — Discharge Instructions (Addendum)
When you get home take 2 doses of the MiraLAX.  And take the mag citrate as directed.  Work note provided to be out of work [employer]ural [employer] and the next day.  CT scan showed no evidence of obstruction.  Did show evidence of chronic constipation.

## 2021-12-31 NOTE — ED Triage Notes (Signed)
Patient complains of 4 days of constipation and has hx of same. Has used several otc remedies with no relief

## 2021-12-31 NOTE — ED Provider Notes (Addendum)
[facility] EMERGENCY DEPARTMENT Provider Note   CSN: [id number] Arrival date & time: 12/31/21  5093     History  No chief complaint on file.   Yvonne [name] is a 36 y.o. female.  Patient with complaint of constipation for 4 days.  Tried a lot of over-the-counter things without relief.  Is having intermittent domino cramping no vomiting.  No blood in the bowel movements.  Patient chart review shows that in 2014 was seen for constipation with impaction and was treated with 1 dose of MiraLAX and a soapsuds enema.  Past medical history significant for migraine headaches chronic back pain thyroid disease patient's had hysterectomy appendectomy and cholecystectomy.       Home Medications Prior to Admission medications   Medication Sig Start Date End Date Taking? Authorizing Provider  buPROPion (WELLBUTRIN XL) 150 MG 24 hr tablet Take 150 mg by mouth daily. 06/30/21   [provider]  hydrOXYzine (ATARAX) 50 MG tablet Take 50 mg by mouth 3 (three) times daily. 06/30/21   [provider]  ibuprofen (ADVIL) 800 MG tablet Take 1 tablet (800 mg total) by mouth every 8 (eight) hours as needed. 09/25/21   Kerrin Champagne, MD  levothyroxine (SYNTHROID) 150 MCG tablet Take by mouth. 08/15/19   [provider]  nortriptyline (PAMELOR) 10 MG capsule 10 mg. 08/31/20   [provider]  SUMAtriptan (IMITREX) 50 MG tablet Take 1 tablet (50 mg total) by mouth once for 1 dose. May repeat in 2 hours if headache persists or recurs. 09/28/21 09/28/21  Merrilee Jansky, MD      Allergies    Aspirin, Bee venom, Metoclopramide, Reglan [metoclopramide hcl], and Metronidazole    Review of Systems   Review of Systems  Constitutional:  Negative for chills and fever.  HENT:  Negative for ear pain and sore throat.   Eyes:  Negative for pain and visual disturbance.  Respiratory:  Negative for cough and shortness of breath.   Cardiovascular:  Negative for  chest pain and palpitations.  Gastrointestinal:  Positive for abdominal pain and constipation. Negative for diarrhea, nausea and vomiting.  Genitourinary:  Negative for dysuria and hematuria.  Musculoskeletal:  Negative for arthralgias and back pain.  Skin:  Negative for color change and rash.  Neurological:  Negative for seizures and syncope.  All other systems reviewed and are negative.   Physical Exam Updated Vital Signs BP 110/62   Pulse 73   Temp 98.5 F (36.9 C) (Oral)   Resp 14   LMP 07/25/2012   SpO2 100%  Physical Exam Vitals and nursing note reviewed.  Constitutional:      General: She is not in acute distress.    Appearance: Normal appearance. She is well-developed. She is obese.  HENT:     Head: Normocephalic and atraumatic.     Mouth/Throat:     Mouth: Mucous membranes are moist.  Eyes:     Extraocular Movements: Extraocular movements intact.     Conjunctiva/sclera: Conjunctivae normal.     Pupils: Pupils are equal, round, and reactive to light.  Cardiovascular:     Rate and Rhythm: Normal rate and regular rhythm.     Heart sounds: No murmur heard. Pulmonary:     Effort: Pulmonary effort is normal. No respiratory distress.     Breath sounds: Normal breath sounds.  Abdominal:     General: There is distension.     Palpations: Abdomen is soft.     Tenderness: There  is no abdominal tenderness. There is no guarding.  Genitourinary:    Rectum: Normal.     Comments: Exam with soft stool in the vault.  Not able to pull it out because just breaks against the finger.  Light brown in color.  Some external skin tags but no external hemorrhoids no prolapsed internal hemorrhoids.  No anal fissure.  No gross blood. Musculoskeletal:        General: No swelling.     Cervical back: Normal range of motion and neck supple.  Skin:    General: Skin is warm and dry.     Capillary Refill: Capillary refill takes less than 2 seconds.  Neurological:     General: No focal deficit  present.     Mental Status: She is alert and oriented to person, place, and time.     Cranial Nerves: No cranial nerve deficit.     Sensory: No sensory deficit.     Motor: No weakness.     Coordination: Coordination normal.  Psychiatric:        Mood and Affect: Mood normal.     ED Results / Procedures / Treatments   Labs (all labs ordered are listed, but only abnormal results are displayed) Labs Reviewed  CBC WITH DIFFERENTIAL/PLATELET - Abnormal; Notable for the following components:      Result Value   WBC 13.4 (*)    Neutro Abs 9.2 (*)    All other components within normal limits  COMPREHENSIVE METABOLIC PANEL  LIPASE, BLOOD    EKG None  Radiology No results found.  Procedures Procedures    Medications Ordered in ED Medications  0.9 %  sodium chloride infusion ( Intravenous New Bag/Given 12/31/21 1559)    ED Course/ Medical Decision Making/ A&P                           Medical Decision Making Amount and/or Complexity of Data Reviewed Labs: ordered. Radiology: ordered.  Risk Prescription drug management.   Based on rectal exam patient without significant impaction will get CT scan to rule out obstruction.  If negative and just shows constipation will have patient treated with mag citrate and MiraLAX at home.  Work note will be provided.  Basic labs CBC white count at 13.4 hemoglobin 12.3 complete metabolic panel pending lipase pending.  Labs without significant abnormalities.  CBC already mention.  Complete metabolic panel no liver function test abnormalities renal functions normal electrolytes are normal lipase is normal.  CT scan of the abdomen shows pretty significant constipation.  Did try a rectal exam to do disimpaction and everything was soft there.  Patient has had a bowel movement here had a little bit of blood with it but probably from my digital exam.  We will give patient work note we will have her do MiraLAX to tonight and I will give her mag  citrate to take.  Patient is now offered that she is on chronic pain management so this is probably the source of the constipation and I suspect that she is fairly constipated most days.  Just occasionally runs into more trouble like she has here.  CT definitely without any signs of obstruction.  Final Clinical Impression(s) / ED Diagnoses Final diagnoses:  Constipation, unspecified constipation type    Rx / DC Orders ED Discharge Orders     None         Vanetta Mulders, MD 12/31/21 1617    Vanetta Mulders,  MD 12/31/21 1908

## 2022-01-02 DIAGNOSIS — G5603 Carpal tunnel syndrome, bilateral upper limbs: Secondary | ICD-10-CM | POA: Diagnosis not present

## 2022-01-02 DIAGNOSIS — G5621 Lesion of ulnar nerve, right upper limb: Secondary | ICD-10-CM | POA: Diagnosis not present

## 2022-01-03 ENCOUNTER — Telehealth: Payer: Self-pay | Admitting: Licensed Clinical Social Worker

## 2022-01-03 NOTE — Patient Outreach (Signed)
  Care Coordination [facility] Note Transition Care Management Follow-up Telephone Call Date of discharge and from where: 12/31/21 from [facility] How have you been since you were released from the hospital? I'm doing better now. No blood in my stools.  Any questions or concerns? No  Items Reviewed: Did the pt receive and understand the discharge instructions provided? Yes  Medications obtained and verified? No  Other? No  Any new allergies since your discharge? No  Dietary orders reviewed? No Do you have support at home? Yes   Home Care and Equipment/Supplies: Were home health services ordered? no If so, what is the name of the agency? na  Has the agency set up a time to come to the patient's home? not applicable Were any new equipment or medical supplies ordered?  No What is the name of the medical supply agency? na Were you able to get the supplies/equipment? not applicable Do you have any questions related to the use of the equipment or supplies? na  Functional Questionnaire: (I = Independent and D = Dependent) ADLs: I  Bathing/Dressing- I  Meal Prep- I  Eating- I  Maintaining continence- I  Transferring/Ambulation- I  Managing Meds- I  Follow up appointments reviewed:  PCP Hospital f/u appt confirmed? No  ED visit only [facility] f/u appt confirmed? No   Are transportation arrangements needed? No  If their condition worsens, is the pt aware to call PCP or go to the Emergency Dept.? Yes Was the patient provided with contact information for the PCP's office or ED? Yes Was to pt encouraged to call back with questions or concerns? Yes  Care Coordination Interventions Activated:  No   Care Coordination Interventions:  No Care Coordination interventions needed at this time.   Encounter Outcome:  Pt. Refused

## 2022-01-12 DIAGNOSIS — F329 Major depressive disorder, single episode, unspecified: Secondary | ICD-10-CM | POA: Diagnosis not present

## 2022-01-18 DIAGNOSIS — R7401 Elevation of levels of liver transaminase levels: Secondary | ICD-10-CM | POA: Diagnosis not present

## 2022-01-18 DIAGNOSIS — R030 Elevated blood-pressure reading, without diagnosis of hypertension: Secondary | ICD-10-CM | POA: Diagnosis not present

## 2022-01-18 DIAGNOSIS — G8929 Other chronic pain: Secondary | ICD-10-CM | POA: Diagnosis not present

## 2022-01-18 DIAGNOSIS — R892 Abnormal level of other drugs, medicaments and biological substances in specimens from other organs, systems and tissues: Secondary | ICD-10-CM | POA: Diagnosis not present

## 2022-01-18 DIAGNOSIS — R7303 Prediabetes: Secondary | ICD-10-CM | POA: Diagnosis not present

## 2022-01-18 DIAGNOSIS — M545 Low back pain, unspecified: Secondary | ICD-10-CM | POA: Diagnosis not present

## 2022-01-18 DIAGNOSIS — Z1159 Encounter for screening for other viral diseases: Secondary | ICD-10-CM | POA: Diagnosis not present

## 2022-01-18 DIAGNOSIS — Z789 Other specified health status: Secondary | ICD-10-CM | POA: Diagnosis not present

## 2022-01-18 DIAGNOSIS — Z6841 Body Mass Index (BMI) 40.0 and over, adult: Secondary | ICD-10-CM | POA: Diagnosis not present

## 2022-01-18 DIAGNOSIS — E039 Hypothyroidism, unspecified: Secondary | ICD-10-CM | POA: Diagnosis not present

## 2022-01-18 DIAGNOSIS — Z79899 Other long term (current) drug therapy: Secondary | ICD-10-CM | POA: Diagnosis not present

## 2022-01-31 DIAGNOSIS — Z419 Encounter for procedure for purposes other than remedying health state, unspecified: Secondary | ICD-10-CM | POA: Diagnosis not present

## 2022-02-09 DIAGNOSIS — F329 Major depressive disorder, single episode, unspecified: Secondary | ICD-10-CM | POA: Diagnosis not present

## 2022-02-16 DIAGNOSIS — R892 Abnormal level of other drugs, medicaments and biological substances in specimens from other organs, systems and tissues: Secondary | ICD-10-CM | POA: Diagnosis not present

## 2022-02-16 DIAGNOSIS — M545 Low back pain, unspecified: Secondary | ICD-10-CM | POA: Diagnosis not present

## 2022-02-16 DIAGNOSIS — Z79899 Other long term (current) drug therapy: Secondary | ICD-10-CM | POA: Diagnosis not present

## 2022-02-16 DIAGNOSIS — Z0130 Encounter for examination of blood pressure without abnormal findings: Secondary | ICD-10-CM | POA: Diagnosis not present

## 2022-02-16 DIAGNOSIS — G8929 Other chronic pain: Secondary | ICD-10-CM | POA: Diagnosis not present

## 2022-02-16 DIAGNOSIS — R030 Elevated blood-pressure reading, without diagnosis of hypertension: Secondary | ICD-10-CM | POA: Diagnosis not present

## 2022-02-16 DIAGNOSIS — R7303 Prediabetes: Secondary | ICD-10-CM | POA: Diagnosis not present

## 2022-02-16 DIAGNOSIS — Z6841 Body Mass Index (BMI) 40.0 and over, adult: Secondary | ICD-10-CM | POA: Diagnosis not present

## 2022-02-16 DIAGNOSIS — E039 Hypothyroidism, unspecified: Secondary | ICD-10-CM | POA: Diagnosis not present

## 2022-03-02 DIAGNOSIS — Z419 Encounter for procedure for purposes other than remedying health state, unspecified: Secondary | ICD-10-CM | POA: Diagnosis not present

## 2022-03-09 DIAGNOSIS — F329 Major depressive disorder, single episode, unspecified: Secondary | ICD-10-CM | POA: Diagnosis not present

## 2022-03-12 ENCOUNTER — Encounter (HOSPITAL_COMMUNITY): Payer: Self-pay

## 2022-03-12 ENCOUNTER — Ambulatory Visit (HOSPITAL_COMMUNITY)
Admission: EM | Admit: 2022-03-12 | Discharge: 2022-03-12 | Disposition: A | Discharge status: Home / Self Care | Payer: Medicaid Other | Attending: Family Medicine | Admitting: Family Medicine

## 2022-03-12 DIAGNOSIS — N760 Acute vaginitis: Secondary | ICD-10-CM | POA: Diagnosis not present

## 2022-03-12 MED ORDER — METRONIDAZOLE 0.75 % VA GEL: 1.0000 | Freq: Two times a day (BID) | VAGINAL | 0 refills | Status: AC

## 2022-03-12 MED ORDER — FLUCONAZOLE 150 MG PO TABS: 150.0000 mg | ORAL_TABLET | ORAL | 0 refills | Status: AC

## 2022-03-12 NOTE — Discharge Instructions (Signed)
Take fluconazole 150 mg--1 tablet every 3 days for 2 doses  MetroGel--1 applicatorful vaginally 2 times daily for 5 days

## 2022-03-12 NOTE — ED Triage Notes (Signed)
Pt c/o vaginal discharge and slight odor since she was positive for BV and yeast in October. States was unable to go to her PCP.

## 2022-03-12 NOTE — ED Provider Notes (Signed)
[facility]    CSN: [id number] Arrival date & time: 03/12/22  0813      History   Chief Complaint Chief Complaint  Patient presents with   Vaginal Discharge    HPI Yvonne [name] is a 36 y.o. female.    Vaginal Discharge  Here for vaginal discharge and irritation.  She feels it is most consistent with her BV and yeast infection she has had in the past.  She was last treated in August when she had BV and yeast on the swab.  The symptoms did resolve; then about a month to month and a half ago the symptoms came back.  No vomiting and no abdominal pain. 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 - tx w/otc med   Asthma    albuterol inhaler   Blood transfusion    2008 with c/s 4 units transfused   Carpal tunnel syndrome on right    Chronic back pain    Depression    Dyspnea    exertion   Headache(784.0)    otc ibuprofen 800mg    Heart murmur    as a child   Obesity    Pneumonia    as a child   Thyroid disease     Patient Active Problem List   Diagnosis Date Noted   S/P lumbar spinal fusion 02/13/2017   Herniated nucleus pulposus, lumbar 04/10/2016   HNP (herniated nucleus pulposus), lumbar 11/05/2014    Class: Chronic   S/P endometrial ablation - HTA 2012 06/03/2012   Sterilization - h/o BTL 06/03/2012   OBESITY 09/03/2007   MIGRAINE HEADACHE 09/03/2007    Past Surgical History:  Procedure Laterality Date   ABDOMINAL HYSTERECTOMY N/A 09/29/2012   Procedure: HYSTERECTOMY ABDOMINAL;  Surgeon: 10/01/2012, MD;  Location: [facility];  Service: Gynecology;  Laterality: N/A;   APPENDECTOMY     BACK SURGERY     BILATERAL SALPINGECTOMY Bilateral 09/29/2012   Procedure: BILATERAL SALPINGECTOMY;  Surgeon: 10/01/2012, MD;  Location: [facility];  Service: Gynecology;  Laterality: Bilateral;   CESAREAN SECTION     CHOLECYSTECTOMY     CYSTOSCOPY  Bilateral 09/29/2012   Procedure: CYSTOSCOPY;  Surgeon: 10/01/2012, MD;  Location: [facility];  Service: Gynecology;  Laterality: Bilateral;   ENDOMETRIAL ABLATION     EXAMINATION UNDER ANESTHESIA N/A 10/16/2012   Procedure: EXAM UNDER ANESTHESIA;  Surgeon: 10/18/2012, MD;  Location: [facility];  Service: Gynecology;  Laterality: N/A;  Suturing Vaginal Cuff   HAND SURGERY Right    carpal tunnel   LAPAROSCOPIC ASSISTED VAGINAL HYSTERECTOMY N/A 09/29/2012   Procedure: LAPAROSCOPIC ASSISTED VAGINAL HYSTERECTOMY;  Surgeon: 10/01/2012, MD;  Location: [facility];  Service: Gynecology;  Laterality: N/A;   LAPAROSCOPIC HYSTERECTOMY Bilateral 09/29/2012   Procedure: HYSTERECTOMY TOTAL LAPAROSCOPIC;  Surgeon: 10/01/2012, MD;  Location: [facility];  Service: Gynecology;  Laterality: Bilateral;  Total Laparoscopic Hysterectomy, Bilateral Salpingectomies, Cystoscopy poss. LAVH poss. TAH   LAPAROSCOPY     x3   LUMBAR LAMINECTOMY N/A 11/05/2014   Procedure: RIGHT L4-5 MICRODISCECTOMY ;  Surgeon: 01/05/2015, MD;  Location: [facility];  Service: Orthopedics;  Laterality: N/A;   LUMBAR LAMINECTOMY/DECOMPRESSION MICRODISCECTOMY N/A 04/10/2016   Procedure: Right L4-5 Microdiscectomy;  Surgeon: 06/08/2016, MD;  Location: [facility];  Service: Orthopedics;  Laterality: N/A;   THYROIDECTOMY     THYROIDECTOMY     TONSILLECTOMY     T & A   TRANSFORAMINAL LUMBAR INTERBODY FUSION (TLIF) WITH PEDICLE SCREW FIXATION 1 LEVEL N/A 02/13/2017   Procedure: Transforaminal Lumbar Interbody Fusion - Lumbar four-five;  Surgeon: Tia Alert, MD;  Location: [facility];  Service: Neurosurgery;  Laterality: N/A;   TUBAL LIGATION      OB History     Gravida  4   Para  4   Term  4   Preterm      AB      Living  1      SAB      IAB      Ectopic      Multiple      Live Births  1            Home Medications    Prior to Admission medications   Medication Sig Start Date End Date Taking? Authorizing Provider   fluconazole (DIFLUCAN) 150 MG tablet Take 1 tablet (150 mg total) by mouth every 3 (three) days for 2 doses. 03/12/22 03/16/22 Yes Eshika Reckart, Janace Aris, MD  metroNIDAZOLE (METROGEL) 0.75 % vaginal gel Place 1 Applicatorful vaginally 2 (two) times daily for 5 days. 03/12/22 03/17/22 Yes Zenia Resides, MD  buPROPion (WELLBUTRIN XL) 150 MG 24 hr tablet Take 150 mg by mouth daily. 06/30/21   [provider]  hydrOXYzine (ATARAX) 50 MG tablet Take 50 mg by mouth 3 (three) times daily. 06/30/21   [provider]  levothyroxine (SYNTHROID) 150 MCG tablet Take by mouth. 08/15/19   [provider]  nortriptyline (PAMELOR) 10 MG capsule 10 mg. 08/31/20   [provider]  polyethylene glycol (MIRALAX) 17 g packet Take 17 g by mouth daily. 12/31/21   Vanetta Mulders, MD  SUMAtriptan (IMITREX) 50 MG tablet Take 1 tablet (50 mg total) by mouth once for 1 dose. May repeat in 2 hours if headache persists or recurs. 09/28/21 09/28/21  LampteyBritta Mccreedy, MD    Family History Family History  Problem Relation Age of Onset   Diabetes Mother    Diabetes Maternal Grandmother    Stroke Father     Social History Social History   Tobacco Use   Smoking status: Every Day    Packs/day: 1.00    Types: Cigarettes    Last attempt to quit: 10/17/2015    Years since quitting: 6.4   Smokeless tobacco: Never  Vaping Use   Vaping Use: Some days  Substance Use Topics   Alcohol use: Yes    Alcohol/week: 1.0 standard drink of alcohol    Types: 1 Glasses of wine per week    Comment: ocassionally   Drug use: No     Allergies   Aspirin, Bee venom, Metoclopramide, Reglan [metoclopramide hcl], and Metronidazole   Review of Systems Review of Systems  Genitourinary:  Positive for vaginal discharge.     Physical Exam Triage Vital Signs ED Triage Vitals  Enc Vitals Group     BP 03/12/22 0929 128/79     Pulse Rate 03/12/22 0929 84     Resp 03/12/22 0929 18     Temp 03/12/22  0929 98.4 F (36.9 C)     Temp Source 03/12/22 0929 Oral     SpO2 03/12/22 0929 97 %     Weight --      Height --      Head Circumference --  Peak Flow --      Pain Score 03/12/22 0930 0     Pain Loc --      Pain Edu? --      Excl. in GC? --    No data found.  Updated Vital Signs BP 128/79 (BP Location: Left Arm)   Pulse 84   Temp 98.4 F (36.9 C) (Oral)   Resp 18   LMP 07/25/2012   SpO2 97%   Visual Acuity Right Eye Distance:   Left Eye Distance:   Bilateral Distance:    Right Eye Near:   Left Eye Near:    Bilateral Near:     Physical Exam Vitals reviewed.  Constitutional:      General: She is not in acute distress.    Appearance: She is not ill-appearing, toxic-appearing or diaphoretic.  Skin:    Coloration: Skin is not pale.  Neurological:     Mental Status: She is alert and oriented to person, place, and time.  Psychiatric:        Behavior: Behavior normal.      UC Treatments / Results  Labs (all labs ordered are listed, but only abnormal results are displayed) Labs Reviewed  CERVICOVAGINAL ANCILLARY ONLY    EKG   Radiology No results found.  Procedures Procedures (including critical care time)  Medications Ordered in UC Medications - No data to display  Initial Impression / Assessment and Plan / UC Course  I have reviewed the triage vital signs and the nursing notes.  Pertinent labs & imaging results that were available during my care of the patient were reviewed by me and considered in my medical decision making (see chart for details).     Empiric treatment is sent for BV and yeast.  Genital swab is done and we will notify her of any positives on the swab that need other treatment Final Clinical Impressions(s) / UC Diagnoses   Final diagnoses:  Acute vaginitis     Discharge Instructions      Take fluconazole 150 mg--1 tablet every 3 days for 2 doses  MetroGel--1 applicatorful vaginally 2 times daily for 5  days     ED Prescriptions     Medication Sig Dispense Auth. Provider   fluconazole (DIFLUCAN) 150 MG tablet Take 1 tablet (150 mg total) by mouth every 3 (three) days for 2 doses. 2 tablet Zenia Resides, MD   metroNIDAZOLE (METROGEL) 0.75 % vaginal gel Place 1 Applicatorful vaginally 2 (two) times daily for 5 days. 100 g Zenia Resides, MD      PDMP not reviewed this encounter.   Zenia Resides, MD 03/12/22 1010

## 2022-03-13 ENCOUNTER — Telehealth (HOSPITAL_COMMUNITY): Payer: Self-pay | Admitting: Emergency Medicine

## 2022-03-13 LAB — CERVICOVAGINAL ANCILLARY ONLY
Bacterial Vaginitis (gardnerella): POSITIVE — AB
Candida Glabrata: NEGATIVE
Candida Vaginitis: NEGATIVE
Chlamydia: NEGATIVE
Comment: NEGATIVE
Comment: NEGATIVE
Comment: NEGATIVE
Comment: NEGATIVE
Comment: NEGATIVE
Comment: NORMAL
Neisseria Gonorrhea: NEGATIVE
Trichomonas: POSITIVE — AB

## 2022-03-13 MED ORDER — METRONIDAZOLE 500 MG PO TABS: 500.0000 mg | ORAL_TABLET | Freq: Two times a day (BID) | ORAL | 0 refills | Status: DC

## 2022-03-16 DIAGNOSIS — R030 Elevated blood-pressure reading, without diagnosis of hypertension: Secondary | ICD-10-CM | POA: Diagnosis not present

## 2022-03-16 DIAGNOSIS — Z6841 Body Mass Index (BMI) 40.0 and over, adult: Secondary | ICD-10-CM | POA: Diagnosis not present

## 2022-03-16 DIAGNOSIS — R892 Abnormal level of other drugs, medicaments and biological substances in specimens from other organs, systems and tissues: Secondary | ICD-10-CM | POA: Diagnosis not present

## 2022-03-16 DIAGNOSIS — Z0130 Encounter for examination of blood pressure without abnormal findings: Secondary | ICD-10-CM | POA: Diagnosis not present

## 2022-03-16 DIAGNOSIS — G43909 Migraine, unspecified, not intractable, without status migrainosus: Secondary | ICD-10-CM | POA: Diagnosis not present

## 2022-03-16 DIAGNOSIS — M545 Low back pain, unspecified: Secondary | ICD-10-CM | POA: Diagnosis not present

## 2022-03-16 DIAGNOSIS — G8929 Other chronic pain: Secondary | ICD-10-CM | POA: Diagnosis not present

## 2022-03-16 DIAGNOSIS — R7303 Prediabetes: Secondary | ICD-10-CM | POA: Diagnosis not present

## 2022-03-16 DIAGNOSIS — Z79899 Other long term (current) drug therapy: Secondary | ICD-10-CM | POA: Diagnosis not present

## 2022-03-16 DIAGNOSIS — E039 Hypothyroidism, unspecified: Secondary | ICD-10-CM | POA: Diagnosis not present

## 2022-03-21 DIAGNOSIS — Z6841 Body Mass Index (BMI) 40.0 and over, adult: Secondary | ICD-10-CM | POA: Diagnosis not present

## 2022-03-21 DIAGNOSIS — Z0130 Encounter for examination of blood pressure without abnormal findings: Secondary | ICD-10-CM | POA: Diagnosis not present

## 2022-03-21 DIAGNOSIS — G8929 Other chronic pain: Secondary | ICD-10-CM | POA: Diagnosis not present

## 2022-03-21 DIAGNOSIS — Z79899 Other long term (current) drug therapy: Secondary | ICD-10-CM | POA: Diagnosis not present

## 2022-03-21 DIAGNOSIS — R892 Abnormal level of other drugs, medicaments and biological substances in specimens from other organs, systems and tissues: Secondary | ICD-10-CM | POA: Diagnosis not present

## 2022-03-21 DIAGNOSIS — R030 Elevated blood-pressure reading, without diagnosis of hypertension: Secondary | ICD-10-CM | POA: Diagnosis not present

## 2022-03-21 DIAGNOSIS — R7303 Prediabetes: Secondary | ICD-10-CM | POA: Diagnosis not present

## 2022-03-21 DIAGNOSIS — E039 Hypothyroidism, unspecified: Secondary | ICD-10-CM | POA: Diagnosis not present

## 2022-03-21 DIAGNOSIS — M545 Low back pain, unspecified: Secondary | ICD-10-CM | POA: Diagnosis not present

## 2022-04-06 DIAGNOSIS — G8929 Other chronic pain: Secondary | ICD-10-CM | POA: Diagnosis not present

## 2022-04-06 DIAGNOSIS — Z0130 Encounter for examination of blood pressure without abnormal findings: Secondary | ICD-10-CM | POA: Diagnosis not present

## 2022-04-06 DIAGNOSIS — Z6841 Body Mass Index (BMI) 40.0 and over, adult: Secondary | ICD-10-CM | POA: Diagnosis not present

## 2022-04-06 DIAGNOSIS — R892 Abnormal level of other drugs, medicaments and biological substances in specimens from other organs, systems and tissues: Secondary | ICD-10-CM | POA: Diagnosis not present

## 2022-04-06 DIAGNOSIS — Z3200 Encounter for pregnancy test, result unknown: Secondary | ICD-10-CM | POA: Diagnosis not present

## 2022-04-06 DIAGNOSIS — R7303 Prediabetes: Secondary | ICD-10-CM | POA: Diagnosis not present

## 2022-04-06 DIAGNOSIS — Z79899 Other long term (current) drug therapy: Secondary | ICD-10-CM | POA: Diagnosis not present

## 2022-04-06 DIAGNOSIS — M545 Low back pain, unspecified: Secondary | ICD-10-CM | POA: Diagnosis not present

## 2022-04-12 DIAGNOSIS — Z79899 Other long term (current) drug therapy: Secondary | ICD-10-CM | POA: Diagnosis not present

## 2022-04-23 DIAGNOSIS — F329 Major depressive disorder, single episode, unspecified: Secondary | ICD-10-CM | POA: Diagnosis not present

## 2022-04-24 ENCOUNTER — Encounter (HOSPITAL_BASED_OUTPATIENT_CLINIC_OR_DEPARTMENT_OTHER): Payer: Self-pay

## 2022-04-24 DIAGNOSIS — R0683 Snoring: Secondary | ICD-10-CM

## 2022-04-24 DIAGNOSIS — R5383 Other fatigue: Secondary | ICD-10-CM

## 2022-05-18 ENCOUNTER — Ambulatory Visit (HOSPITAL_BASED_OUTPATIENT_CLINIC_OR_DEPARTMENT_OTHER): Payer: Medicaid Other | Admitting: Internal Medicine

## 2022-05-18 DIAGNOSIS — R5383 Other fatigue: Secondary | ICD-10-CM

## 2022-05-18 DIAGNOSIS — R0683 Snoring: Secondary | ICD-10-CM

## 2022-05-21 ENCOUNTER — Encounter (HOSPITAL_COMMUNITY): Payer: Self-pay

## 2022-05-21 ENCOUNTER — Ambulatory Visit (HOSPITAL_COMMUNITY)
Admission: EM | Admit: 2022-05-21 | Discharge: 2022-05-21 | Disposition: A | Discharge status: Home / Self Care | Payer: Medicaid Other | Attending: Physician Assistant | Admitting: Physician Assistant

## 2022-05-21 DIAGNOSIS — N898 Other specified noninflammatory disorders of vagina: Secondary | ICD-10-CM | POA: Insufficient documentation

## 2022-05-21 DIAGNOSIS — Z113 Encounter for screening for infections with a predominantly sexual mode of transmission: Secondary | ICD-10-CM | POA: Insufficient documentation

## 2022-05-21 MED ORDER — METRONIDAZOLE 500 MG PO TABS
500.0000 mg | ORAL_TABLET | Freq: Two times a day (BID) | ORAL | 0 refills | Status: DC
Start: 2022-05-21 — End: 2022-08-13

## 2022-05-21 NOTE — ED Provider Notes (Signed)
Yvonne [name]    CSN: [id number] Arrival date & time: 05/21/22  1608      History   Chief Complaint Chief Complaint  Patient presents with   Vaginal Discharge    HPI Yvonne [name] is a 37 y.o. female.   Patient presents today with a week plus long history of vaginal discharge.  She reports this is copious, brown, with a slight odor.  She has a history of recurrent bacterial vaginosis and states current symptoms are similar to previous episodes of this condition.  She denies any pelvic pain, abdominal pain, fever, nausea, vomiting.  She is status post hysterectomy so no concern for pregnancy.  She has not been sexually active since October 2023 but is generally active with female partners and does not always use condoms.  She denies any changes to personal hygiene products including soaps or detergents.  Denies any recent antibiotics.  She was last seen December 2023 at which point she tested positive for trichomonas and bacterial vaginosis.  She completed course of metronidazole and has not had additional medication since that time.    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 - tx w/otc med   Asthma    albuterol inhaler   Blood transfusion    2008 with c/s 4 units transfused   Carpal tunnel syndrome on right    Chronic back pain    Depression    Dyspnea    exertion   Headache(784.0)    otc ibuprofen 834m   Heart murmur    as a child   Obesity    Pneumonia    as a child   Thyroid disease     Patient Active Problem List   Diagnosis Date Noted   S/P lumbar spinal fusion 02/13/2017   Herniated nucleus pulposus, lumbar 04/10/2016   HNP (herniated nucleus pulposus), lumbar 11/05/2014    Class: Chronic   S/P endometrial ablation - HTA 2012 06/03/2012   Sterilization - h/o BTL 06/03/2012   OBESITY 09/03/2007   MIGRAINE HEADACHE 09/03/2007    Past Surgical History:  Procedure  Laterality Date   ABDOMINAL HYSTERECTOMY N/A 09/29/2012   Procedure: HYSTERECTOMY ABDOMINAL;  Surgeon: ADelice Lesch MD;  [facility];  Service: Gynecology;  Laterality: N/A;   APPENDECTOMY     BACK SURGERY     BILATERAL SALPINGECTOMY Bilateral 09/29/2012   Procedure: BILATERAL SALPINGECTOMY;  Surgeon: ADelice Lesch MD;  [facility];  Service: Gynecology;  Laterality: Bilateral;   CESAREAN SECTION     CHOLECYSTECTOMY     CYSTOSCOPY Bilateral 09/29/2012   Procedure: CYSTOSCOPY;  Surgeon: ADelice Lesch MD;  [facility];  Service: Gynecology;  Laterality: Bilateral;   ENDOMETRIAL ABLATION     EXAMINATION UNDER ANESTHESIA N/A 10/16/2012   Procedure: EXAM UNDER ANESTHESIA;  Surgeon: ADelice Lesch MD;  [facility];  Service: Gynecology;  Laterality: N/A;  Suturing Vaginal Cuff   HAND SURGERY Right    carpal tunnel   LAPAROSCOPIC ASSISTED VAGINAL HYSTERECTOMY N/A 09/29/2012   Procedure: LAPAROSCOPIC ASSISTED VAGINAL HYSTERECTOMY;  Surgeon: ADelice Lesch MD;  [facility];  Service: Gynecology;  Laterality: N/A;   LAPAROSCOPIC HYSTERECTOMY Bilateral 09/29/2012   Procedure: HYSTERECTOMY TOTAL LAPAROSCOPIC;  Surgeon: ADelice Lesch MD;  [facility];  Service: Gynecology;  Laterality: Bilateral;  Total Laparoscopic Hysterectomy, Bilateral Salpingectomies, Cystoscopy poss. LAVH  poss. TAH   LAPAROSCOPY     x3   LUMBAR LAMINECTOMY N/A 11/05/2014   Procedure: RIGHT L4-5 MICRODISCECTOMY ;  Surgeon: Jessy Oto, MD;  Location: [place];  Service: Orthopedics;  Laterality: N/A;   LUMBAR LAMINECTOMY/DECOMPRESSION MICRODISCECTOMY N/A 04/10/2016   Procedure: Right L4-5 Microdiscectomy;  Surgeon: Jessy Oto, MD;  Location: [place];  Service: Orthopedics;  Laterality: N/A;   THYROIDECTOMY     THYROIDECTOMY     TONSILLECTOMY     T & A   TRANSFORAMINAL LUMBAR INTERBODY FUSION (TLIF) WITH PEDICLE SCREW FIXATION 1 LEVEL N/A 02/13/2017   Procedure: Transforaminal Lumbar  Interbody Fusion - Lumbar four-five;  Surgeon: Eustace Moore, MD;  Location: [place];  Service: Neurosurgery;  Laterality: N/A;   TUBAL LIGATION      OB History     Gravida  4   Para  4   Term  4   Preterm      AB      Living  1      SAB      IAB      Ectopic      Multiple      Live Births  1            Home Medications    Prior to Admission medications   Medication Sig Start Date End Date Taking? Authorizing Provider  QUEtiapine (SEROQUEL) 400 MG tablet Take 450 mg by mouth at bedtime.   Yes [provider]  hydrOXYzine (ATARAX) 50 MG tablet Take 50 mg by mouth 3 (three) times daily. 06/30/21   [provider]  metroNIDAZOLE (FLAGYL) 500 MG tablet Take 1 tablet (500 mg total) by mouth 2 (two) times daily. 05/21/22   Raymond Azure, Derry Skill, PA-C  SUMAtriptan (IMITREX) 50 MG tablet Take 1 tablet (50 mg total) by mouth once for 1 dose. May repeat in 2 hours if headache persists or recurs. 09/28/21 09/28/21  Lamptey, Myrene Galas, MD    Family History Family History  Problem Relation Age of Onset   Diabetes Mother    Diabetes Maternal Grandmother    Stroke Father     Social History Social History   Tobacco Use   Smoking status: Former    Packs/day: 1.00    Types: Cigarettes    Quit date: 10/17/2015    Years since quitting: 6.5   Smokeless tobacco: Never  Vaping Use   Vaping Use: Former  Substance Use Topics   Alcohol use: Yes    Alcohol/week: 1.0 standard drink of alcohol    Types: 1 Glasses of wine per week    Comment: ocassionally   Drug use: No     Allergies   Aspirin, Bee venom, Metoclopramide, Reglan [metoclopramide hcl], and Metronidazole   Review of Systems Review of Systems  Constitutional:  Negative for activity change, appetite change, fatigue and fever.  Gastrointestinal:  Negative for abdominal pain, diarrhea, nausea and vomiting.  Genitourinary:  Positive for vaginal discharge. Negative for dysuria, frequency, urgency,  vaginal bleeding and vaginal pain.     Physical Exam Triage Vital Signs ED Triage Vitals  Enc Vitals Group     BP 05/21/22 1806 110/69     Pulse Rate 05/21/22 1806 93     Resp 05/21/22 1806 16     Temp 05/21/22 1806 98.2 F (36.8 C)     Temp Source 05/21/22 1806 Oral     SpO2 05/21/22 1806 96 %     Weight --  Height --      Head Circumference --      Peak Flow --      Pain Score 05/21/22 1808 0     Pain Loc --      Pain Edu? --      Excl. in [place]? --    No data found.  Updated Vital Signs BP 110/69 (BP Location: Left Arm)   Pulse 93   Temp 98.2 F (36.8 C) (Oral)   Resp 16   LMP 07/25/2012   SpO2 96%   Visual Acuity Right Eye Distance:   Left Eye Distance:   Bilateral Distance:    Right Eye Near:   Left Eye Near:    Bilateral Near:     Physical Exam Vitals reviewed.  Constitutional:      General: She is awake. She is not in acute distress.    Appearance: Normal appearance. She is well-developed. She is not ill-appearing.     Comments: Very pleasant female appears stated age in no acute distress sitting comfortably in exam room  HENT:     Head: Normocephalic and atraumatic.  Cardiovascular:     Rate and Rhythm: Normal rate and regular rhythm.     Heart sounds: Normal heart sounds, S1 normal and S2 normal. No murmur heard. Pulmonary:     Effort: Pulmonary effort is normal.     Breath sounds: Normal breath sounds. No wheezing, rhonchi or rales.     Comments: Clear to auscultation bilaterally Abdominal:     General: Bowel sounds are normal.     Palpations: Abdomen is soft.     Tenderness: There is no abdominal tenderness. There is no right CVA tenderness, left CVA tenderness, guarding or rebound.     Comments: Benign abdominal exam  Psychiatric:        Behavior: Behavior is cooperative.      UC Treatments / Results  Labs (all labs ordered are listed, but only abnormal results are displayed) Labs Reviewed  CERVICOVAGINAL ANCILLARY ONLY     EKG   Radiology No results found.  Procedures Procedures (including critical care time)  Medications Ordered in UC Medications - No data to display  Initial Impression / Assessment and Plan / UC Course  I have reviewed the triage vital signs and the nursing notes.  Pertinent labs & imaging results that were available during my care of the patient were reviewed by me and considered in my medical decision making (see chart for details).     Patient is well-appearing, afebrile, nontoxic, nontachycardic.  Vital signs and physical exam are reassuring today.  Will empirically treat for bacterial vaginosis with metronidazole.  Patient has metronidazole listed as allergy but reports that she only has nausea with this medication and has taken it as recently as 2 months ago without side effect or concern.  We did discuss that she is to avoid alcohol while on this medication due to associated Antabuse side effects.  We will contact her if we need to arrange any additional treatment based on her swab result.  Recommended that she wear cotton loosefitting underwear and use hypoallergenic soaps and detergents.  She is to rest and drink plenty of fluid.  Discussed that if she has any worsening symptoms including persistent discharge, pelvic pain, abdominal pain, fever, nausea, vomiting she should be seen immediately.  Strict return precautions given.  Patient declined work excuse note.  Final Clinical Impressions(s) / UC Diagnoses   Final diagnoses:  Vaginal discharge  Routine screening  for STI (sexually transmitted infection)     Discharge Instructions      We are treating you for bacterial vaginosis.  Take metronidazole twice daily for 7 days.  Do not drink any alcohol while on this medication as it will cause you to vomit.  Wear loosefitting cotton underwear and use hypoallergenic soaps and detergents.  We will contact you if we need to arrange any additional treatment.  If you develop any  pelvic pain, abdominal pain, fever, nausea, vomiting you should be seen immediately.     ED Prescriptions     Medication Sig Dispense Auth. Provider   metroNIDAZOLE (FLAGYL) 500 MG tablet Take 1 tablet (500 mg total) by mouth 2 (two) times daily. 14 tablet Eviana Sibilia, Derry Skill, PA-C      PDMP not reviewed this encounter.   Terrilee Croak, PA-C 05/21/22 1829

## 2022-05-21 NOTE — ED Triage Notes (Signed)
Patient reports a history of BV. Patient states she has had brown vaginal discharge and sweaty.

## 2022-05-21 NOTE — Discharge Instructions (Signed)
We are treating you for bacterial vaginosis.  Take metronidazole twice daily for 7 days.  Do not drink any alcohol while on this medication as it will cause you to vomit.  Wear loosefitting cotton underwear and use hypoallergenic soaps and detergents.  We will contact you if we need to arrange any additional treatment.  If you develop any pelvic pain, abdominal pain, fever, nausea, vomiting you should be seen immediately.

## 2022-05-22 LAB — CERVICOVAGINAL ANCILLARY ONLY
Bacterial Vaginitis (gardnerella): NEGATIVE
Candida Glabrata: NEGATIVE
Candida Vaginitis: NEGATIVE
Chlamydia: NEGATIVE
Comment: NEGATIVE
Comment: NEGATIVE
Comment: NEGATIVE
Comment: NEGATIVE
Comment: NEGATIVE
Comment: NORMAL
Neisseria Gonorrhea: NEGATIVE
Trichomonas: POSITIVE — AB

## 2022-05-29 ENCOUNTER — Ambulatory Visit (HOSPITAL_BASED_OUTPATIENT_CLINIC_OR_DEPARTMENT_OTHER): Payer: Medicaid Other | Attending: Psychiatry | Admitting: Internal Medicine

## 2022-07-03 ENCOUNTER — Emergency Department
Admission: EM | Admit: 2022-07-03 | Discharge: 2022-07-03 | Disposition: A | Discharge status: Home / Self Care | Payer: Medicaid Other | Attending: Emergency Medicine | Admitting: Emergency Medicine

## 2022-07-03 ENCOUNTER — Emergency Department: Payer: Medicaid Other

## 2022-07-03 ENCOUNTER — Other Ambulatory Visit: Payer: Self-pay

## 2022-07-03 DIAGNOSIS — J45909 Unspecified asthma, uncomplicated: Secondary | ICD-10-CM | POA: Diagnosis not present

## 2022-07-03 DIAGNOSIS — J4520 Mild intermittent asthma, uncomplicated: Secondary | ICD-10-CM

## 2022-07-03 DIAGNOSIS — Z7951 Long term (current) use of inhaled steroids: Secondary | ICD-10-CM | POA: Diagnosis not present

## 2022-07-03 DIAGNOSIS — G43901 Migraine, unspecified, not intractable, with status migrainosus: Secondary | ICD-10-CM

## 2022-07-03 DIAGNOSIS — R519 Headache, unspecified: Secondary | ICD-10-CM | POA: Diagnosis present

## 2022-07-03 DIAGNOSIS — G43011 Migraine without aura, intractable, with status migrainosus: Secondary | ICD-10-CM | POA: Diagnosis not present

## 2022-07-03 LAB — BASIC METABOLIC PANEL
Anion gap: 9 (ref 5–15)
BUN: 12 mg/dL (ref 6–20)
CO2: 22 mmol/L (ref 22–32)
Calcium: 9 mg/dL (ref 8.9–10.3)
Chloride: 104 mmol/L (ref 98–111)
Creatinine, Ser: 1.01 mg/dL — ABNORMAL HIGH (ref 0.44–1.00)
GFR, Estimated: >60 mL/min (ref >60)
Glucose, Bld: 160 mg/dL — ABNORMAL HIGH (ref 70–99)
Potassium: 4 mmol/L (ref 3.5–5.1)
Sodium: 135 mmol/L (ref 135–145)

## 2022-07-03 LAB — CBC
HCT: 38.5 % (ref 36.0–46.0)
Hemoglobin: 12.8 g/dL (ref 12.0–15.0)
MCH: 27.9 pg (ref 26.0–34.0)
MCHC: 33.2 g/dL (ref 30.0–36.0)
MCV: 83.9 fL (ref 80.0–100.0)
Platelets: 364 10*3/uL (ref 150–400)
RBC: 4.59 MIL/uL (ref 3.87–5.11)
RDW: 13.9 % (ref 11.5–15.5)
WBC: 14.7 10*3/uL — ABNORMAL HIGH (ref 4.0–10.5)
nRBC: 0 % (ref 0.0–0.2)

## 2022-07-03 MED ORDER — SUMATRIPTAN 20 MG/ACT NA SOLN: 1.0000 | NASAL | 2 refills | Status: AC | PRN

## 2022-07-03 MED ORDER — ONDANSETRON 4 MG PO TBDP
8.0000 mg | ORAL_TABLET | Freq: Once | ORAL | Status: AC
  Administered 2022-07-03: 8 mg via ORAL
  Filled 2022-07-03: qty 2

## 2022-07-03 MED ORDER — ALBUTEROL SULFATE HFA 108 (90 BASE) MCG/ACT IN AERS: 2.0000 | INHALATION_SPRAY | RESPIRATORY_TRACT | 0 refills | Status: AC | PRN

## 2022-07-03 MED ORDER — KETOROLAC TROMETHAMINE 10 MG PO TABS
10.0000 mg | ORAL_TABLET | Freq: Once | ORAL | Status: AC
  Administered 2022-07-03: 10 mg via ORAL
  Filled 2022-07-03: qty 1

## 2022-07-03 MED ORDER — IPRATROPIUM-ALBUTEROL 0.5-2.5 (3) MG/3ML IN SOLN
3.0000 mL | Freq: Once | RESPIRATORY_TRACT | Status: AC
  Administered 2022-07-03: 3 mL via RESPIRATORY_TRACT
  Filled 2022-07-03: qty 3

## 2022-07-03 MED ORDER — DIPHENHYDRAMINE HCL 25 MG PO CAPS
25.0000 mg | ORAL_CAPSULE | Freq: Once | ORAL | Status: AC
  Administered 2022-07-03: 25 mg via ORAL
  Filled 2022-07-03: qty 1

## 2022-07-03 NOTE — ED Triage Notes (Signed)
Pt here with asthma. Pt states she had an inhaler but it has expired. Pt was at the store and began to get SOB.

## 2022-07-03 NOTE — ED Provider Notes (Signed)
[facility] Provider Note    Event Date/Time   First MD Initiated Contact with Patient 07/03/22 1151     (approximate)   History   Chief Complaint: Asthma   HPI  Yvonne [name] is a 37 y.o. female  with pmh of asthma and migraines who comes to the ED c/o migraine headache.  It is b/l frontal, gradual onset, persistent for the past 2 days. Assoc. With light sensitivity. No vomiting.  Feels like a typical migraine. She does not take any medications at home for this.  In the context of this, she reports having some increased sob which feels like asthma.  No cp, fever, chills, vision changes, paresthesia, motor weakness       Physical Exam   Triage Vital Signs: ED Triage Vitals [07/03/22 1109]  Enc Vitals Group     BP 136/64     Pulse Rate 98     Resp 18     Temp 98.4 F (36.9 C)     Temp Source Oral     SpO2 100 %     Weight 233 lb 14.5 oz (106.1 kg)     Height 5' (1.524 m)     Head Circumference      Peak Flow      Pain Score 8     Pain Loc      Pain Edu?      Excl. in [place]?     Most recent vital signs: Vitals:   07/03/22 1109  BP: 136/64  Pulse: 98  Resp: 18  Temp: 98.4 F (36.9 C)  SpO2: 100%    General: Awake, no distress.  CV:  Good peripheral perfusion. RRR Resp:  Normal effort. ctab Abd:  No distention.  Other:  No LE edema   ED Results / Procedures / Treatments   Labs (all labs ordered are listed, but only abnormal results are displayed) Labs Reviewed  CBC - Abnormal; Notable for the following components:      Result Value   WBC 14.7 (*)    All other components within normal limits  BASIC METABOLIC PANEL - Abnormal; Notable for the following components:   Glucose, Bld 160 (*)    Creatinine, Ser 1.01 (*)    All other components within normal limits     EKG    RADIOLOGY CXR interpreted by me, appears normal. Radiology report reviewed   PROCEDURES:  Procedures   MEDICATIONS ORDERED IN  ED: Medications  ketorolac (TORADOL) tablet 10 mg (has no administration in time range)  ondansetron (ZOFRAN-ODT) disintegrating tablet 8 mg (has no administration in time range)  diphenhydrAMINE (BENADRYL) capsule 25 mg (has no administration in time range)  ipratropium-albuterol (DUONEB) 0.5-2.5 (3) MG/3ML nebulizer solution 3 mL (3 mLs Nebulization Given 07/03/22 1112)     IMPRESSION / MDM / ASSESSMENT AND PLAN / ED COURSE  I reviewed the triage vital signs and the nursing notes.  DDx: migraine, asthma exac, anemia, AKI, pnemonia  Patient's presentation is most consistent with severe exacerbation of chronic illness.  Pt p/w migraine sx and mild sob. Nontoxic.   Considering the patient's symptoms, medical history, and physical examination today, I have low suspicion for ACS, PE, TAD, pneumothorax, carditis, mediastinitis, pneumonia, CHF, or sepsis. Doubt IIH, glaucoma, meningitis. No signs of trauma.  Pt given migraine regimen and feeling much better.  Rx inh and imitrex sent.       FINAL CLINICAL IMPRESSION(S) / ED DIAGNOSES   Final diagnoses:  Migraine with status migrainosus, not intractable, unspecified migraine type     Rx / DC Orders   ED Discharge Orders          Ordered    albuterol (PROVENTIL HFA) 108 (90 Base) MCG/ACT inhaler  Every 4 hours PRN        07/03/22 1206    SUMAtriptan (IMITREX) 20 MG/ACT nasal spray  As needed        07/03/22 1206             Note:  This document was prepared using Dragon voice recognition software and may include unintentional dictation errors.   Carrie Mew, MD 07/04/22 [phone]

## 2022-07-25 ENCOUNTER — Ambulatory Visit (HOSPITAL_COMMUNITY)
Admission: EM | Admit: 2022-07-25 | Discharge: 2022-07-25 | Disposition: A | Discharge status: Home / Self Care | Payer: Medicaid Other

## 2022-07-25 ENCOUNTER — Ambulatory Visit (INDEPENDENT_AMBULATORY_CARE_PROVIDER_SITE_OTHER): Payer: Medicaid Other

## 2022-07-25 ENCOUNTER — Encounter (HOSPITAL_COMMUNITY): Payer: Self-pay | Admitting: Emergency Medicine

## 2022-07-25 DIAGNOSIS — S93491A Sprain of other ligament of right ankle, initial encounter: Secondary | ICD-10-CM

## 2022-07-25 MED ORDER — MELOXICAM 15 MG PO TABS: 15.0000 mg | ORAL_TABLET | Freq: Every day | ORAL | 0 refills | Status: AC

## 2022-07-25 NOTE — Discharge Instructions (Addendum)
Wear boot as needed Stop wearing boot after 7 days or sooner Ice and elevate Meloxicam 1x daily for pain

## 2022-07-25 NOTE — ED Provider Notes (Signed)
[facility]   [id number] 07/25/22 Arrival Time: 0807  ASSESSMENT & PLAN:  X-rays of foot and ankle negative for fracture  1. Sprain of anterior talofibular ligament of right ankle, initial encounter    History and exam is consistent with a lateral ankle sprain of the ATFL.  Due to her difficulty weightbearing, I will put her in a cam boot for support.  Recommended that she not wear this longer than 7 days to prevent ankle stiffness.  Recommended ice, compression, elevation as well.  Meloxicam 15 mg daily as needed for pain.  Work note given.  All questions answered she agrees to plan.  Meds ordered this encounter  Medications   meloxicam (MOBIC) 15 MG tablet    Sig: Take 1 tablet (15 mg total) by mouth daily.    Dispense:  30 tablet    Refill:  0     Discharge Instructions      Wear boot as needed Stop wearing boot after 7 days or sooner Ice and elevate Meloxicam 1x daily for pain       Reviewed expectations re: course of current medical issues. Questions answered. Outlined signs and symptoms indicating need for more acute intervention. Patient verbalized understanding. After Visit Summary given.   SUBJECTIVE: Pleasant 37 year old female comes urgent care to be evaluated for right ankle pain.  Yesterday she was getting out of a car when her foot got caught.  She suffered an inversion mechanism of her right ankle.  She immediately noticed pain and swelling at that dorsum and lateral aspect of the foot and ankle.  She had pain trying to weight-bear on it.  No prior injury before.  She has not tried any medicines.  Patient's last menstrual period was 07/25/2012. Past Surgical History:  Procedure Laterality Date   ABDOMINAL HYSTERECTOMY N/A 09/29/2012   Procedure: HYSTERECTOMY ABDOMINAL;  Surgeon: Purcell Nails, MD;  [facility];  Service: Gynecology;  Laterality: N/A;   APPENDECTOMY     BACK SURGERY     BILATERAL SALPINGECTOMY Bilateral 09/29/2012    Procedure: BILATERAL SALPINGECTOMY;  Surgeon: Purcell Nails, MD;  [facility];  Service: Gynecology;  Laterality: Bilateral;   CESAREAN SECTION     CHOLECYSTECTOMY     CYSTOSCOPY Bilateral 09/29/2012   Procedure: CYSTOSCOPY;  Surgeon: Purcell Nails, MD;  [facility];  Service: Gynecology;  Laterality: Bilateral;   ENDOMETRIAL ABLATION     EXAMINATION UNDER ANESTHESIA N/A 10/16/2012   Procedure: EXAM UNDER ANESTHESIA;  Surgeon: Purcell Nails, MD;  [facility];  Service: Gynecology;  Laterality: N/A;  Suturing Vaginal Cuff   HAND SURGERY Right    carpal tunnel   LAPAROSCOPIC ASSISTED VAGINAL HYSTERECTOMY N/A 09/29/2012   Procedure: LAPAROSCOPIC ASSISTED VAGINAL HYSTERECTOMY;  Surgeon: Purcell Nails, MD;  [facility];  Service: Gynecology;  Laterality: N/A;   LAPAROSCOPIC HYSTERECTOMY Bilateral 09/29/2012   Procedure: HYSTERECTOMY TOTAL LAPAROSCOPIC;  Surgeon: Purcell Nails, MD;  Location[facility];  Service: Gynecology;  Laterality: Bilateral;  Total Laparoscopic Hysterectomy, Bilateral Salpingectomies, Cystoscopy poss. LAVH poss. TAH   LAPAROSCOPY     x3   LUMBAR LAMINECTOMY N/A 11/05/2014   Procedure: RIGHT L4-5 MICRODISCECTOMY ;  Surgeon: Kerrin Champagne, MD;  Location: [facility];  Service: Orthopedics;  Laterality: N/A;   LUMBAR LAMINECTOMY/DECOMPRESSION MICRODISCECTOMY N/A 04/10/2016   Procedure: Right L4-5 Microdiscectomy;  Surgeon: Kerrin Champagne, MD;  Location: [facility];  Service: Orthopedics;  Laterality: N/A;   THYROIDECTOMY  THYROIDECTOMY     TONSILLECTOMY     T & A   TRANSFORAMINAL LUMBAR INTERBODY FUSION (TLIF) WITH PEDICLE SCREW FIXATION 1 LEVEL N/A 02/13/2017   Procedure: Transforaminal Lumbar Interbody Fusion - Lumbar four-five;  Surgeon: Tia Alert, MD;  Location: [facility];  Service: Neurosurgery;  Laterality: N/A;   TUBAL LIGATION       OBJECTIVE:  Vitals:   07/25/22 0909  BP: 136/83  Pulse: 93  Resp: 20  Temp: 98.2 F (36.8 C)  TempSrc: Oral   SpO2: 98%     Physical Exam Vitals and nursing note reviewed.  Constitutional:      Appearance: Normal appearance.  Cardiovascular:     Rate and Rhythm: Normal rate.  Pulmonary:     Effort: Pulmonary effort is normal.  Musculoskeletal:     Comments: Right ankle -moderate soft tissue swelling at the lateral aspect of the ankle and dorsum of the foot.  Otherwise no deformity.  She is tender to palpation at the lateral malleolus and anterior tibiotalar joint and dorsum of the foot over the tarsals.  Nontender at the medial malleolus.  Range of motion ankle limited by pain.  She has a positive anterior drawer test and positive tilt test.  Negative Klieger test.  Negative fibular squeeze test.  Neurological:     General: No focal deficit present.     Mental Status: She is alert.  Psychiatric:        Mood and Affect: Mood normal.      Labs: Results for orders placed or performed during the hospital encounter of 07/03/22  CBC  Result Value Ref Range   WBC 14.7 (H) 4.0 - 10.5 K/uL   RBC 4.59 3.87 - 5.11 MIL/uL   Hemoglobin 12.8 12.0 - 15.0 g/dL   HCT 16.1 09.6 - 04.5 %   MCV 83.9 80.0 - 100.0 fL   MCH 27.9 26.0 - 34.0 pg   MCHC 33.2 30.0 - 36.0 g/dL   RDW 40.9 81.1 - 91.4 %   Platelets 364 150 - 400 K/uL   nRBC 0.0 0.0 - 0.2 %  Basic metabolic panel  Result Value Ref Range   Sodium 135 135 - 145 mmol/L   Potassium 4.0 3.5 - 5.1 mmol/L   Chloride 104 98 - 111 mmol/L   CO2 22 22 - 32 mmol/L   Glucose, Bld 160 (H) 70 - 99 mg/dL   BUN 12 6 - 20 mg/dL   Creatinine, Ser 7.82 (H) 0.44 - 1.00 mg/dL   Calcium 9.0 8.9 - 95.6 mg/dL   GFR, Estimated >21 >30 mL/min   Anion gap 9 5 - 15   Labs Reviewed - No data to display  Imaging: DG Foot Complete Right  Result Date: 07/25/2022 CLINICAL DATA:  Stepped out of car awkwardly with knot on top of right foot. Increasing pain. EXAM: RIGHT FOOT COMPLETE - 3+ VIEW; RIGHT ANKLE - COMPLETE 3+ VIEW COMPARISON:  Right tibia/fibula, ankle, and  foot radiographs 12/03/2010 FINDINGS: Ankle: There is no acute fracture or dislocation. Ankle alignment is normal. The ankle mortise is intact. There is no erosive change. The soft tissues are unremarkable. Foot: There is no acute fracture or dislocation. Bony alignment is normal. The joint spaces are preserved. There is no erosive change. There is mild soft tissue swelling over the dorsum of the foot. IMPRESSION: 1. No acute fracture or dislocation in the ankle or foot. 2. Mild soft tissue swelling over the dorsum of the foot. Electronically Signed  By: Lesia Hausen M.D.   On: 07/25/2022 09:40   DG Ankle Complete Right  Result Date: 07/25/2022 CLINICAL DATA:  Stepped out of car awkwardly with knot on top of right foot. Increasing pain. EXAM: RIGHT FOOT COMPLETE - 3+ VIEW; RIGHT ANKLE - COMPLETE 3+ VIEW COMPARISON:  Right tibia/fibula, ankle, and foot radiographs 12/03/2010 FINDINGS: Ankle: There is no acute fracture or dislocation. Ankle alignment is normal. The ankle mortise is intact. There is no erosive change. The soft tissues are unremarkable. Foot: There is no acute fracture or dislocation. Bony alignment is normal. The joint spaces are preserved. There is no erosive change. There is mild soft tissue swelling over the dorsum of the foot. IMPRESSION: 1. No acute fracture or dislocation in the ankle or foot. 2. Mild soft tissue swelling over the dorsum of the foot. Electronically Signed   By: Lesia Hausen M.D.   On: 07/25/2022 09:40     Allergies  Allergen Reactions   Aspirin Nausea And Vomiting and Anaphylaxis    GI upset   Bee Venom Anaphylaxis, Swelling and Other (See Comments)    SYNCOPE SWELLING OF ENTIRE BODY AND THROAT  Other reaction(s): Unknown SYNCOPE SWELLING OF ENTIRE BODY AND THROAT  Fainting Swelling of whole body and throat  fainting   Metoclopramide Anaphylaxis   Reglan [Metoclopramide Hcl] Anaphylaxis   Metronidazole Nausea Only                                                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 - tx w/otc med   Asthma    albuterol inhaler   Blood transfusion    2008 with c/s 4 units transfused   Carpal tunnel syndrome on right    Chronic back pain    Depression    Dyspnea    exertion   Headache(784.0)    otc ibuprofen 800mg    Heart murmur    as a child   Obesity    Pneumonia    as a child   Thyroid disease     Social History   Socioeconomic History   Marital status: Single    Spouse name: Not on file   Number of children: Not on file   Years of education: Not on file   Highest education level: Not on file  Occupational History   Not on file  Tobacco Use   Smoking status: Former    Packs/day: 1    Types: Cigarettes    Quit date: 10/17/2015    Years since quitting: 6.7   Smokeless tobacco: Never  Vaping Use   Vaping Use: Former  Substance and Sexual Activity   Alcohol use: Yes    Alcohol/week: 1.0 standard drink of alcohol    Types: 1 Glasses of wine per week    Comment: ocassionally   Drug use: No   Sexual activity: Yes    Birth control/protection: Surgical, Condom    Comment: surgical tubal  Other Topics Concern   Not on file  Social History Narrative   Not on file   Social Determinants of Health   Financial Resource Strain: Not on file  Food Insecurity: Not on file  Transportation Needs: Not on file  Physical Activity: Not  on file  Stress: Not on file  Social Connections: Not on file  Intimate Partner Violence: Not on file    Family History  Problem Relation Age of Onset   Diabetes Mother    Diabetes Maternal Grandmother    Stroke Father       Kilyn Maragh, Baldemar Friday, MD 07/25/22 1009

## 2022-07-25 NOTE — ED Triage Notes (Signed)
Pain in top of foot.  Reports stepping out of a car awkwardly.  There is a knot on top of right foot, able to move toes, patient has increasing pain with putting pressure on foot.  This occurred yesterday.    Patient has not taken any medications for this

## 2022-08-13 ENCOUNTER — Ambulatory Visit
Admission: EM | Admit: 2022-08-13 | Discharge: 2022-08-13 | Disposition: A | Discharge status: Home / Self Care | Payer: Medicaid Other | Attending: Nurse Practitioner | Admitting: Nurse Practitioner

## 2022-08-13 ENCOUNTER — Encounter: Payer: Self-pay | Admitting: Nurse Practitioner

## 2022-08-13 DIAGNOSIS — N760 Acute vaginitis: Secondary | ICD-10-CM | POA: Diagnosis present

## 2022-08-13 MED ORDER — METRONIDAZOLE 500 MG PO TABS
500.0000 mg | ORAL_TABLET | Freq: Two times a day (BID) | ORAL | 0 refills | Status: DC
Start: 2022-08-13 — End: 2022-09-08

## 2022-08-13 NOTE — ED Provider Notes (Signed)
[facility]    CSN: [id number] Arrival date & time: 08/13/22  1635      History   Chief Complaint Chief Complaint  Patient presents with   vaginal discharge    HPI Yvonne [name] is a 37 y.o. female presents for evaluation of vaginal discharge.  Patient reports 3 days of a malodorous vaginal discharge with increased sweating.  She states this is her typical presentation for BV infections which she states she has recurrent ones.  Denies any dysuria, STD exposure or concern, fevers or chills.  She has not taken any OTC medications for her symptoms.  In addition she reports itchy watery eyes when she goes outside.  States she has a history of seasonal allergies and takes Zyrtec.  No other concerns at this time.  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 - tx w/otc med   Asthma    albuterol inhaler   Blood transfusion    2008 with c/s 4 units transfused   Carpal tunnel syndrome on right    Chronic back pain    Depression    Dyspnea    exertion   Headache(784.0)    otc ibuprofen 800mg    Heart murmur    as a child   Obesity    Pneumonia    as a child   Thyroid disease     Patient Active Problem List   Diagnosis Date Noted   S/P lumbar spinal fusion 02/13/2017   Herniated nucleus pulposus, lumbar 04/10/2016   HNP (herniated nucleus pulposus), lumbar 11/05/2014    Class: Chronic   S/P endometrial ablation - HTA 2012 06/03/2012   Sterilization - h/o BTL 06/03/2012   OBESITY 09/03/2007   MIGRAINE HEADACHE 09/03/2007    Past Surgical History:  Procedure Laterality Date   ABDOMINAL HYSTERECTOMY N/A 09/29/2012   Procedure: HYSTERECTOMY ABDOMINAL;  Surgeon: Purcell Nails, MD;  Location: [facility];  Service: Gynecology;  Laterality: N/A;   APPENDECTOMY     BACK SURGERY     BILATERAL SALPINGECTOMY Bilateral 09/29/2012   Procedure: BILATERAL SALPINGECTOMY;  Surgeon: Purcell Nails,  MD;  Location: [facility];  Service: Gynecology;  Laterality: Bilateral;   CESAREAN SECTION     CHOLECYSTECTOMY     CYSTOSCOPY Bilateral 09/29/2012   Procedure: CYSTOSCOPY;  Surgeon: Purcell Nails, MD;  Location: [facility];  Service: Gynecology;  Laterality: Bilateral;   ENDOMETRIAL ABLATION     EXAMINATION UNDER ANESTHESIA N/A 10/16/2012   Procedure: EXAM UNDER ANESTHESIA;  Surgeon: Purcell Nails, MD;  Location: [facility];  Service: Gynecology;  Laterality: N/A;  Suturing Vaginal Cuff   HAND SURGERY Right    carpal tunnel   LAPAROSCOPIC ASSISTED VAGINAL HYSTERECTOMY N/A 09/29/2012   Procedure: LAPAROSCOPIC ASSISTED VAGINAL HYSTERECTOMY;  Surgeon: Purcell Nails, MD;  Location: [facility];  Service: Gynecology;  Laterality: N/A;   LAPAROSCOPIC HYSTERECTOMY Bilateral 09/29/2012   Procedure: HYSTERECTOMY TOTAL LAPAROSCOPIC;  Surgeon: Purcell Nails, MD;  Location: [facility];  Service: Gynecology;  Laterality: Bilateral;  Total Laparoscopic Hysterectomy, Bilateral Salpingectomies, Cystoscopy poss. LAVH poss. TAH   LAPAROSCOPY     x3   LUMBAR LAMINECTOMY N/A 11/05/2014   Procedure: RIGHT L4-5 MICRODISCECTOMY ;  Surgeon: Kerrin Champagne, MD;  Location: [facility];  Service: Orthopedics;  Laterality: N/A;   LUMBAR LAMINECTOMY/DECOMPRESSION MICRODISCECTOMY N/A 04/10/2016   Procedure: Right L4-5 Microdiscectomy;  Surgeon: Kerrin Champagne, MD;  Location: [facility] OR;  Service: Orthopedics;  Laterality: N/A;   THYROIDECTOMY     THYROIDECTOMY     TONSILLECTOMY     T & A   TRANSFORAMINAL LUMBAR INTERBODY FUSION (TLIF) WITH PEDICLE SCREW FIXATION 1 LEVEL N/A 02/13/2017   Procedure: Transforaminal Lumbar Interbody Fusion - Lumbar four-five;  Surgeon: Tia Alert, MD;  Location: [facility] OR;  Service: Neurosurgery;  Laterality: N/A;   TUBAL LIGATION      OB History     Gravida  4   Para  4   Term  4   Preterm      AB      Living  1      SAB      IAB      Ectopic      Multiple      Live Births  1             Home Medications    Prior to Admission medications   Medication Sig Start Date End Date Taking? Authorizing Provider  metroNIDAZOLE (FLAGYL) 500 MG tablet Take 1 tablet (500 mg total) by mouth 2 (two) times daily. 08/13/22  Yes Radford Pax, NP  albuterol (PROVENTIL HFA) 108 (90 Base) MCG/ACT inhaler Inhale 2 puffs into the lungs every 4 (four) hours as needed for wheezing or shortness of breath. 07/03/22   Sharman Cheek, MD  hydrOXYzine (ATARAX) 50 MG tablet Take 50 mg by mouth 3 (three) times daily. 06/30/21   [provider]  levothyroxine (SYNTHROID) 75 MCG tablet Take 75 mcg by mouth daily before breakfast.    [provider]  meloxicam (MOBIC) 15 MG tablet Take 1 tablet (15 mg total) by mouth daily. 07/25/22 08/24/22  Rafoth, Baldemar Friday, MD  QUEtiapine (SEROQUEL) 400 MG tablet Take 450 mg by mouth at bedtime.    [provider]  SUMAtriptan (IMITREX) 20 MG/ACT nasal spray Place 1 spray (20 mg total) into the nose as needed for migraine or headache. May repeat in 2 hours if headache persists or recurs.  Use nasal spray on the same side as the headache. 07/03/22 07/03/23  Sharman Cheek, MD  SUMAtriptan (IMITREX) 50 MG tablet Take 1 tablet (50 mg total) by mouth once for 1 dose. May repeat in 2 hours if headache persists or recurs. 09/28/21 09/28/21  Lamptey, Britta Mccreedy, MD    Family History Family History  Problem Relation Age of Onset   Diabetes Mother    Diabetes Maternal Grandmother    Stroke Father     Social History Social History   Tobacco Use   Smoking status: Former    Packs/day: 1    Types: Cigarettes    Quit date: 10/17/2015    Years since quitting: 6.8   Smokeless tobacco: Never  Vaping Use   Vaping Use: Former  Substance Use Topics   Alcohol use: Yes    Alcohol/week: 1.0 standard drink of alcohol    Types: 1 Glasses of wine per week    Comment: ocassionally   Drug use: No     Allergies   Aspirin, Bee venom, Metoclopramide, Reglan  [metoclopramide hcl], and Metronidazole   Review of Systems Review of Systems  Genitourinary:  Positive for vaginal discharge.     Physical Exam Triage Vital Signs ED Triage Vitals  Enc Vitals Group     BP 08/13/22 1728 124/76     Pulse Rate 08/13/22 1728 100     Resp 08/13/22  1728 16     Temp 08/13/22 1728 97.9 F (36.6 C)     Temp Source 08/13/22 1728 Oral     SpO2 08/13/22 1728 98 %     Weight --      Height --      Head Circumference --      Peak Flow --      Pain Score 08/13/22 1726 0     Pain Loc --      Pain Edu? --      Excl. in GC? --    No data found.  Updated Vital Signs BP 124/76 (BP Location: Right Arm)   Pulse 100   Temp 97.9 F (36.6 C) (Oral)   Resp 16   LMP 07/25/2012   SpO2 98%   Visual Acuity Right Eye Distance:   Left Eye Distance:   Bilateral Distance:    Right Eye Near:   Left Eye Near:    Bilateral Near:     Physical Exam Vitals and nursing note reviewed.  Constitutional:      General: She is not in acute distress.    Appearance: Normal appearance. She is not ill-appearing.  HENT:     Head: Normocephalic and atraumatic.  Eyes:     Pupils: Pupils are equal, round, and reactive to light.  Cardiovascular:     Rate and Rhythm: Normal rate.  Pulmonary:     Effort: Pulmonary effort is normal.  Skin:    General: Skin is warm and dry.  Neurological:     General: No focal deficit present.     Mental Status: She is alert and oriented to person, place, and time.  Psychiatric:        Mood and Affect: Mood normal.        Behavior: Behavior normal.      UC Treatments / Results  Labs (all labs ordered are listed, but only abnormal results are displayed) Labs Reviewed  CERVICOVAGINAL ANCILLARY ONLY    EKG   Radiology No results found.  Procedures Procedures (including critical care time)  Medications Ordered in UC Medications - No data to display  Initial Impression / Assessment and Plan / UC Course  I have reviewed  the triage vital signs and the nursing notes.  Pertinent labs & imaging results that were available during my care of the patient were reviewed by me and considered in my medical decision making (see chart for details).    Vaginal testing as ordered and will contact for any positive results. Start metronidazole given presentation Advised to continue her allergy medicine daily and to follow-up with her PCP for additional treatment options ER precautions reviewed and patient verbalized understanding Final Clinical Impressions(s) / UC Diagnoses   Final diagnoses:  Acute vaginitis     Discharge Instructions      The clinical contact you with results of the testing done today if positive Start metronidazole twice daily for 7 days Continue your allergy medication and follow-up with your PCP if this is not improving your symptoms Please go to the ER if you develop any worsening symptoms     ED Prescriptions     Medication Sig Dispense Auth. Provider   metroNIDAZOLE (FLAGYL) 500 MG tablet Take 1 tablet (500 mg total) by mouth 2 (two) times daily. 14 tablet Radford Pax, NP      PDMP not reviewed this encounter.   Radford Pax, NP 08/13/22 1753

## 2022-08-13 NOTE — ED Triage Notes (Signed)
Pt states she has recurrent BV. Pt c/o sweat stains. Denies bleeding, discharge.  Pt c/o puffy and painful eyes since yesterday after being outside. Hx seasonal allergies.

## 2022-08-13 NOTE — Discharge Instructions (Signed)
The clinical contact you with results of the testing done today if positive Start metronidazole twice daily for 7 days Continue your allergy medication and follow-up with your PCP if this is not improving your symptoms Please go to the ER if you develop any worsening symptoms

## 2022-08-14 LAB — CERVICOVAGINAL ANCILLARY ONLY
Bacterial Vaginitis (gardnerella): NEGATIVE
Candida Glabrata: NEGATIVE
Candida Vaginitis: NEGATIVE
Comment: NEGATIVE
Comment: NEGATIVE
Comment: NEGATIVE

## 2022-09-08 ENCOUNTER — Ambulatory Visit
Admission: RE | Admit: 2022-09-08 | Discharge: 2022-09-08 | Disposition: A | Discharge status: Home / Self Care | Payer: Medicaid Other | Source: Ambulatory Visit | Attending: Urgent Care | Admitting: Urgent Care

## 2022-09-08 VITALS — BP 125/84 | HR 94 | Temp 98.1°F | Resp 20

## 2022-09-08 DIAGNOSIS — N760 Acute vaginitis: Secondary | ICD-10-CM

## 2022-09-08 DIAGNOSIS — B3731 Acute candidiasis of vulva and vagina: Secondary | ICD-10-CM | POA: Diagnosis not present

## 2022-09-08 DIAGNOSIS — N898 Other specified noninflammatory disorders of vagina: Secondary | ICD-10-CM | POA: Insufficient documentation

## 2022-09-08 DIAGNOSIS — Z113 Encounter for screening for infections with a predominantly sexual mode of transmission: Secondary | ICD-10-CM | POA: Diagnosis not present

## 2022-09-08 DIAGNOSIS — B9689 Other specified bacterial agents as the cause of diseases classified elsewhere: Secondary | ICD-10-CM | POA: Diagnosis present

## 2022-09-08 DIAGNOSIS — R600 Localized edema: Secondary | ICD-10-CM | POA: Diagnosis present

## 2022-09-08 DIAGNOSIS — Z8742 Personal history of other diseases of the female genital tract: Secondary | ICD-10-CM | POA: Insufficient documentation

## 2022-09-08 MED ORDER — FUROSEMIDE 40 MG PO TABS: 40.0000 mg | ORAL_TABLET | Freq: Every day | ORAL | 0 refills | Status: DC

## 2022-09-08 MED ORDER — FLUCONAZOLE 150 MG PO TABS: 150.0000 mg | ORAL_TABLET | ORAL | 0 refills | Status: DC

## 2022-09-08 MED ORDER — METRONIDAZOLE 500 MG PO TABS: 500.0000 mg | ORAL_TABLET | Freq: Two times a day (BID) | ORAL | 0 refills | Status: DC

## 2022-09-08 MED ORDER — POTASSIUM CHLORIDE ER 10 MEQ PO TBCR: 10.0000 meq | EXTENDED_RELEASE_TABLET | Freq: Every day | ORAL | 0 refills | Status: AC

## 2022-09-08 NOTE — ED Triage Notes (Addendum)
Pt c/o bilat LE swelling x 4-5 days-pt also c/o vaginal d/c x 3 days-NAD-steady gait

## 2022-09-08 NOTE — ED Provider Notes (Signed)
[facility]  Note:  This document was prepared using Conservation officer, historic buildings and may include unintentional dictation errors.  MRN: [id number] DOB: [birth year]  Subjective:   Yvonne [name] is a 37 y.o. female presenting for multiple concerns.    Reports 5-day history of lower leg swelling extending toward the feet.  No chest pain, shortness of breath, wheezing, calf pain, calf redness or warmth.  No history of DVT.  No history of diabetes.  No history of CKD to her knowledge.  Patient admits very poor diet.  Regularly eats takeout, fast food.  She also adds salt to her foods at home. Has also had 3-day history of vaginal discharge. Denies fever, n/v, abdominal pain, pelvic pain, rashes, dysuria, urinary frequency, hematuria.  Has a history of bacterial vaginosis and yeast infection and would like to be covered for this.  No current facility-administered medications for this encounter.  Current Outpatient Medications:    albuterol (PROVENTIL HFA) 108 (90 Base) MCG/ACT inhaler, Inhale 2 puffs into the lungs every 4 (four) hours as needed for wheezing or shortness of breath., Disp: 1 each, Rfl: 0   hydrOXYzine (ATARAX) 50 MG tablet, Take 50 mg by mouth 3 (three) times daily., Disp: , Rfl:    levothyroxine (SYNTHROID) 75 MCG tablet, Take 75 mcg by mouth daily before breakfast., Disp: , Rfl:    metroNIDAZOLE (FLAGYL) 500 MG tablet, Take 1 tablet (500 mg total) by mouth 2 (two) times daily., Disp: 14 tablet, Rfl: 0   QUEtiapine (SEROQUEL) 400 MG tablet, Take 450 mg by mouth at bedtime., Disp: , Rfl:    SUMAtriptan (IMITREX) 20 MG/ACT nasal spray, Place 1 spray (20 mg total) into the nose as needed for migraine or headache. May repeat in 2 hours if headache persists or recurs.  Use nasal spray on the same side as the headache., Disp: 2 each, Rfl: 2   SUMAtriptan (IMITREX) 50 MG tablet, Take 1 tablet (50 mg total) by mouth once for 1 dose. 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 - tx w/otc med   Asthma    albuterol inhaler   Blood transfusion    2008 with c/s 4 units transfused   Carpal tunnel syndrome on right    Chronic back pain    Depression    Dyspnea    exertion   Headache(784.0)    otc ibuprofen 800mg    Heart murmur    as a child   Obesity    Pneumonia    as a child   Thyroid disease      Past Surgical History:  Procedure Laterality Date   ABDOMINAL HYSTERECTOMY N/A 09/29/2012   Procedure: HYSTERECTOMY ABDOMINAL;  Surgeon: Purcell Nails, MD;  Location: [facility];  Service: Gynecology;  Laterality: N/A;   APPENDECTOMY  BACK SURGERY     BILATERAL SALPINGECTOMY Bilateral 09/29/2012   Procedure: BILATERAL SALPINGECTOMY;  Surgeon: Purcell Nails, MD;  Location: [facility];  Service: Gynecology;  Laterality: Bilateral;   CESAREAN SECTION     CHOLECYSTECTOMY     CYSTOSCOPY Bilateral 09/29/2012   Procedure: CYSTOSCOPY;  Surgeon: Purcell Nails, MD;  Location: [facility];  Service: Gynecology;  Laterality: Bilateral;   ENDOMETRIAL ABLATION     EXAMINATION UNDER ANESTHESIA N/A 10/16/2012   Procedure: EXAM UNDER ANESTHESIA;  Surgeon: Purcell Nails, MD;  Location: [facility];  Service: Gynecology;  Laterality: N/A;  Suturing Vaginal Cuff   HAND SURGERY Right    carpal tunnel   LAPAROSCOPIC ASSISTED VAGINAL HYSTERECTOMY N/A 09/29/2012   Procedure: LAPAROSCOPIC  ASSISTED VAGINAL HYSTERECTOMY;  Surgeon: Purcell Nails, MD;  Location: [facility];  Service: Gynecology;  Laterality: N/A;   LAPAROSCOPIC HYSTERECTOMY Bilateral 09/29/2012   Procedure: HYSTERECTOMY TOTAL LAPAROSCOPIC;  Surgeon: Purcell Nails, MD;  Location: [facility];  Service: Gynecology;  Laterality: Bilateral;  Total Laparoscopic Hysterectomy, Bilateral Salpingectomies, Cystoscopy poss. LAVH poss. TAH   LAPAROSCOPY     x3   LUMBAR LAMINECTOMY N/A 11/05/2014   Procedure: RIGHT L4-5 MICRODISCECTOMY ;  Surgeon: Kerrin Champagne, MD;  Location: [facility];  Service: Orthopedics;  Laterality: N/A;   LUMBAR LAMINECTOMY/DECOMPRESSION MICRODISCECTOMY N/A 04/10/2016   Procedure: Right L4-5 Microdiscectomy;  Surgeon: Kerrin Champagne, MD;  Location: [facility];  Service: Orthopedics;  Laterality: N/A;   THYROIDECTOMY     THYROIDECTOMY     TONSILLECTOMY     T & A   TRANSFORAMINAL LUMBAR INTERBODY FUSION (TLIF) WITH PEDICLE SCREW FIXATION 1 LEVEL N/A 02/13/2017   Procedure: Transforaminal Lumbar Interbody Fusion - Lumbar four-five;  Surgeon: Tia Alert, MD;  Location: [facility] OR;  Service: Neurosurgery;  Laterality: N/A;   TUBAL LIGATION      Family History  Problem Relation Age of Onset   Diabetes Mother    Diabetes Maternal Grandmother    Stroke Father     Social History   Tobacco Use   Smoking status: Former    Packs/day: 1    Types: Cigarettes    Quit date: 10/17/2015    Years since quitting: 6.8   Smokeless tobacco: Never  Vaping Use   Vaping Use: Never used  Substance Use Topics   Alcohol use: Yes    Alcohol/week: 1.0 standard drink of alcohol    Types: 1 Glasses of wine per week    Comment: ocassionally   Drug use: No    ROS   Objective:   Vitals: BP 125/84 (BP Location: Right Arm)   Pulse 94   Temp 98.1 F (36.7 C) (Oral)   Resp 20   LMP 07/25/2012   SpO2 97%   Physical Exam Constitutional:      General: She is not in acute distress.    Appearance: Normal appearance. She is  well-developed. She is not ill-appearing, toxic-appearing or diaphoretic.  HENT:     Head: Normocephalic and atraumatic.     Nose: Nose normal.     Mouth/Throat:     Mouth: Mucous membranes are moist.  Eyes:     General: No scleral icterus.       Right eye: No discharge.        Left eye: No discharge.     Extraocular Movements: Extraocular movements intact.     Conjunctiva/sclera: Conjunctivae normal.  Cardiovascular:     Rate and Rhythm: Normal rate and  regular rhythm.     Heart sounds: Normal heart sounds. No murmur heard.    No friction rub. No gallop.  Pulmonary:     Effort: Pulmonary effort is normal. No respiratory distress.     Breath sounds: No stridor. No wheezing, rhonchi or rales.  Chest:     Chest wall: No tenderness.  Abdominal:     General: Bowel sounds are normal. There is no distension.     Palpations: Abdomen is soft. There is no mass.     Tenderness: There is no abdominal tenderness. There is no right CVA tenderness, left CVA tenderness, guarding or rebound.  Musculoskeletal:     Right lower leg: Edema (1+ up to distal calves) present.     Left lower leg: Edema (1+ up to distal calves) present.  Skin:    General: Skin is warm and dry.  Neurological:     General: No focal deficit present.     Mental Status: She is alert and oriented to person, place, and time.  Psychiatric:        Mood and Affect: Mood normal.        Behavior: Behavior normal.        Thought Content: Thought content normal.        Judgment: Judgment normal.     Assessment and Plan :   PDMP not reviewed this encounter.  1. Peripheral edema   2. Vaginal discharge   3. Bacterial vaginosis   4. Yeast vaginitis    Emphasized need to practice low sodium diet.  Recommend furosemide for 5 days, will supplement potassium for 5 days as well.  Follow-up with PCP.  We will treat patient empirically for bacterial vaginosis with Flagyl and for yeast vaginitis with fluconazole.  Labs pending.  Counseled patient on potential for adverse effects with medications prescribed/recommended today, ER and return-to-clinic precautions discussed, patient verbalized understanding.    Wallis Bamberg, PA-C 09/08/22 1016

## 2022-09-08 NOTE — Discharge Instructions (Addendum)
For diabetes or elevated blood sugar, please make sure you are limiting and avoiding starchy, carbohydrate foods like pasta, breads, sweet breads, pastry, rice, potatoes, desserts. These foods can elevate your blood sugar. Also, limit and avoid drinks that contain a lot of sugar such as sodas, sweet teas, fruit juices.  Drinking plain water will be much more helpful, try 64 ounces of water daily.  It is okay to flavor your water naturally by cutting cucumber, lemon, mint or lime, placing it in a picture with water and drinking it over a period of 24-48 hours as long as it remains refrigerated.  For elevated blood pressure, make sure you are monitoring salt in your diet.  Do not eat restaurant foods and limit processed foods at home. I highly recommend you prepare and cook your own foods at home.  Processed foods include things like frozen meals, pre-seasoned meats and dinners, deli meats, canned foods as these foods contain a high amount of sodium/salt.  Make sure you are paying attention to sodium labels on foods you buy at the grocery store. Buy your spices separately such as garlic powder, onion powder, cumin, cayenne, parsley flakes so that you can avoid seasonings that contain salt. However, salt-free seasonings are available and can be used, an example is Mrs. Dash and includes a lot of different mixtures that do not contain salt.  Lastly, when cooking using oils that are healthier for you is important. This includes olive oil, avocado oil, canola oil. We have discussed a lot of foods to avoid but below is a list of foods that can be very healthy to use in your diet whether it is for diabetes, cholesterol, high blood pressure, or in general healthy eating.  Salads - kale, spinach, cabbage, spring mix, arugula Fruits - avocadoes, berries (blueberries, raspberries, blackberries), apples, oranges, pomegranate, grapefruit, kiwi Vegetables - asparagus, cauliflower, broccoli, green beans, brussel sprouts,  bell peppers, beets; stay away from or limit starchy vegetables like potatoes, carrots, peas Other general foods - kidney beans, egg whites, almonds, walnuts, sunflower seeds, pumpkin seeds, fat free yogurt, almond milk, flax seeds, quinoa, oats  Meat - It is better to eat lean meats and limit your red meat including pork to once a week.  Wild caught fish, chicken breast are good options as they tend to be leaner sources of good protein. Still be mindful of the sodium labels for the meats you buy.  DO NOT EAT ANY FOODS ON THIS LIST THAT YOU ARE ALLERGIC TO. For more specific needs, I highly recommend consulting a dietician or nutritionist but this can definitely be a good starting point.  

## 2022-09-10 LAB — CERVICOVAGINAL ANCILLARY ONLY
Bacterial Vaginitis (gardnerella): NEGATIVE
Candida Glabrata: NEGATIVE
Candida Vaginitis: NEGATIVE
Chlamydia: NEGATIVE
Comment: NEGATIVE
Comment: NEGATIVE
Comment: NEGATIVE
Comment: NEGATIVE
Comment: NEGATIVE
Comment: NORMAL
Neisseria Gonorrhea: NEGATIVE
Trichomonas: NEGATIVE

## 2022-09-30 ENCOUNTER — Ambulatory Visit (HOSPITAL_COMMUNITY)
Admission: EM | Admit: 2022-09-30 | Discharge: 2022-09-30 | Disposition: A | Discharge status: Home / Self Care | Payer: Medicaid Other | Attending: Emergency Medicine | Admitting: Emergency Medicine

## 2022-09-30 ENCOUNTER — Encounter (HOSPITAL_COMMUNITY): Payer: Self-pay

## 2022-09-30 DIAGNOSIS — G8929 Other chronic pain: Secondary | ICD-10-CM

## 2022-09-30 DIAGNOSIS — K047 Periapical abscess without sinus: Secondary | ICD-10-CM | POA: Diagnosis not present

## 2022-09-30 DIAGNOSIS — K0889 Other specified disorders of teeth and supporting structures: Secondary | ICD-10-CM | POA: Diagnosis not present

## 2022-09-30 DIAGNOSIS — M549 Dorsalgia, unspecified: Secondary | ICD-10-CM | POA: Diagnosis not present

## 2022-09-30 MED ORDER — AMOXICILLIN-POT CLAVULANATE 875-125 MG PO TABS: 1.0000 | ORAL_TABLET | Freq: Two times a day (BID) | ORAL | 0 refills | Status: AC

## 2022-09-30 MED ORDER — IBUPROFEN 800 MG PO TABS: 800.0000 mg | ORAL_TABLET | Freq: Three times a day (TID) | ORAL | 0 refills | Status: DC

## 2022-09-30 MED ORDER — AMOXICILLIN-POT CLAVULANATE 875-125 MG PO TABS: 1.0000 | ORAL_TABLET | Freq: Two times a day (BID) | ORAL | 0 refills | Status: DC

## 2022-09-30 MED ORDER — LIDOCAINE VISCOUS HCL 2 % MT SOLN: 15.0000 mL | OROMUCOSAL | 0 refills | Status: AC | PRN

## 2022-09-30 NOTE — ED Provider Notes (Signed)
[facility]    CSN: [id number] Arrival date & time: 09/30/22  1036     History   Chief Complaint Chief Complaint  Patient presents with   Dental Pain    HPI Yvonne [name] is a 37 y.o. female.  Here with dental pain for 1 month. Located right lower mouth.  Pain increasing recently.  No fever. No shortness of breath or trouble breathing Tried orajel without relief Has appointment with dentist in 1 month  Also has chronic back pain, reports "rod" in her back Tried to see [facility] medical pain management. Did not work out. Ibuprofen sometimes helps but she ran out   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 - tx w/otc med   Asthma    albuterol inhaler   Blood transfusion    2008 with c/s 4 units transfused   Carpal tunnel syndrome on right    Chronic back pain    Depression    Dyspnea    exertion   Headache(784.0)    otc ibuprofen 800mg    Heart murmur    as a child   Obesity    Pneumonia    as a child   Thyroid disease     Patient Active Problem List   Diagnosis Date Noted   S/P lumbar spinal fusion 02/13/2017   Herniated nucleus pulposus, lumbar 04/10/2016   HNP (herniated nucleus pulposus), lumbar 11/05/2014    Class: Chronic   S/P endometrial ablation - HTA 2012 06/03/2012   Sterilization - h/o BTL 06/03/2012   OBESITY 09/03/2007   MIGRAINE HEADACHE 09/03/2007    Past Surgical History:  Procedure Laterality Date   ABDOMINAL HYSTERECTOMY N/A 09/29/2012   Procedure: HYSTERECTOMY ABDOMINAL;  Surgeon: Purcell Nails, MD;  Location: [facility];  Service: Gynecology;  Laterality: N/A;   APPENDECTOMY     BACK SURGERY     BILATERAL SALPINGECTOMY Bilateral 09/29/2012   Procedure: BILATERAL SALPINGECTOMY;  Surgeon: Purcell Nails, MD;  Location: [facility];  Service: Gynecology;  Laterality: Bilateral;   CESAREAN SECTION     CHOLECYSTECTOMY     CYSTOSCOPY Bilateral 09/29/2012    Procedure: CYSTOSCOPY;  Surgeon: Purcell Nails, MD;  Location: [facility];  Service: Gynecology;  Laterality: Bilateral;   ENDOMETRIAL ABLATION     EXAMINATION UNDER ANESTHESIA N/A 10/16/2012   Procedure: EXAM UNDER ANESTHESIA;  Surgeon: Purcell Nails, MD;  Location: [facility];  Service: Gynecology;  Laterality: N/A;  Suturing Vaginal Cuff   HAND SURGERY Right    carpal tunnel   LAPAROSCOPIC ASSISTED VAGINAL HYSTERECTOMY N/A 09/29/2012   Procedure: LAPAROSCOPIC ASSISTED VAGINAL HYSTERECTOMY;  Surgeon: Purcell Nails, MD;  Location: [facility];  Service: Gynecology;  Laterality: N/A;   LAPAROSCOPIC HYSTERECTOMY Bilateral 09/29/2012   Procedure: HYSTERECTOMY TOTAL LAPAROSCOPIC;  Surgeon: Purcell Nails, MD;  Location: [facility];  Service: Gynecology;  Laterality: Bilateral;  Total Laparoscopic Hysterectomy, Bilateral Salpingectomies, Cystoscopy poss. LAVH poss. TAH   LAPAROSCOPY     x3   LUMBAR LAMINECTOMY N/A 11/05/2014   Procedure: RIGHT L4-5 MICRODISCECTOMY ;  Surgeon: Kerrin Champagne, MD;  Location: [facility];  Service: Orthopedics;  Laterality: N/A;   LUMBAR LAMINECTOMY/DECOMPRESSION MICRODISCECTOMY N/A 04/10/2016   Procedure: Right L4-5 Microdiscectomy;  Surgeon: Kerrin Champagne, MD;  Location: [facility];  Service: Orthopedics;  Laterality: N/A;   THYROIDECTOMY     THYROIDECTOMY  TONSILLECTOMY     T & A   TRANSFORAMINAL LUMBAR INTERBODY FUSION (TLIF) WITH PEDICLE SCREW FIXATION 1 LEVEL N/A 02/13/2017   Procedure: Transforaminal Lumbar Interbody Fusion - Lumbar four-five;  Surgeon: Tia Alert, MD;  Location: [facility];  Service: Neurosurgery;  Laterality: N/A;   TUBAL LIGATION      OB History     Gravida  4   Para  4   Term  4   Preterm      AB      Living  1      SAB      IAB      Ectopic      Multiple      Live Births  1            Home Medications    Prior to Admission medications   Medication Sig Start Date End Date Taking? Authorizing Provider  albuterol (PROVENTIL  HFA) 108 (90 Base) MCG/ACT inhaler Inhale 2 puffs into the lungs every 4 (four) hours as needed for wheezing or shortness of breath. 07/03/22  Yes Sharman Cheek, MD  amoxicillin-clavulanate (AUGMENTIN) 875-125 MG tablet Take 1 tablet by mouth every 12 (twelve) hours for 7 days. 09/30/22 10/07/22 Yes Jaelen Soth, Lurena Joiner, PA-C  furosemide (LASIX) 40 MG tablet Take 1 tablet (40 mg total) by mouth daily. 09/08/22  Yes Wallis Bamberg, PA-C  hydrOXYzine (ATARAX) 50 MG tablet Take 50 mg by mouth 3 (three) times daily. 06/30/21  Yes [provider]  ibuprofen (ADVIL) 800 MG tablet Take 1 tablet (800 mg total) by mouth 3 (three) times daily. 09/30/22  Yes Kalayah Leske, Lurena Joiner, PA-C  levothyroxine (SYNTHROID) 75 MCG tablet Take 75 mcg by mouth daily before breakfast.   Yes [provider]  lidocaine (XYLOCAINE) 2 % solution Use as directed 15 mLs in the mouth or throat every 3 (three) hours as needed for mouth pain. Swish/gargle and spit out 09/30/22  Yes [place] Espinoza, Lurena Joiner, PA-C  potassium chloride (KLOR-CON) 10 MEQ tablet Take 1 tablet (10 mEq total) by mouth daily. 09/08/22  Yes Wallis Bamberg, PA-C  QUEtiapine (SEROQUEL) 400 MG tablet Take 450 mg by mouth at bedtime.   Yes [provider]  SUMAtriptan (IMITREX) 20 MG/ACT nasal spray Place 1 spray (20 mg total) into the nose as needed for migraine or headache. May repeat in 2 hours if headache persists or recurs.  Use nasal spray on the same side as the headache. 07/03/22 07/03/23 Yes Sharman Cheek, MD    Family History Family History  Problem Relation Age of Onset   Diabetes Mother    Diabetes Maternal Grandmother    Stroke Father     Social History Social History   Tobacco Use   Smoking status: Former    Packs/day: 1    Types: Cigarettes    Quit date: 10/17/2015    Years since quitting: 6.9   Smokeless tobacco: Never  Vaping Use   Vaping Use: Never used  Substance Use Topics   Alcohol use: Yes    Alcohol/week: 1.0 standard drink of  alcohol    Types: 1 Glasses of wine per week    Comment: ocassionally   Drug use: No     Allergies   Aspirin, Bee venom, Metoclopramide, Reglan [metoclopramide hcl], and Metronidazole   Review of Systems Review of Systems Per HPI  Physical Exam Triage Vital Signs ED Triage Vitals [09/30/22 1140]  Enc Vitals Group     BP 135/83     Pulse  Rate 89     Resp 18     Temp 98.3 F (36.8 C)     Temp Source Oral     SpO2 95 %     Weight      Height      Head Circumference      Peak Flow      Pain Score      Pain Loc      Pain Edu?      Excl. in GC?    No data found.  Updated Vital Signs BP 128/81 (BP Location: Left Arm)   Pulse 89   Temp 97.9 F (36.6 C) (Oral)   Resp 18   LMP 07/25/2012   SpO2 98%    Physical Exam Vitals and nursing note reviewed.  Constitutional:      General: She is not in acute distress.    Appearance: She is not ill-appearing or diaphoretic.  HENT:     Nose: No rhinorrhea.     Mouth/Throat:     Mouth: Mucous membranes are moist.     Dentition: Abnormal dentition. Dental tenderness and dental caries present.     Pharynx: Oropharynx is clear. No posterior oropharyngeal erythema.      Comments: Dental tenderness with tenderness around the gums. No obvious swelling or erythema. Abnormal throughout with several carries  Cardiovascular:     Rate and Rhythm: Normal rate and regular rhythm.     Heart sounds: Normal heart sounds.  Pulmonary:     Effort: Pulmonary effort is normal.     Breath sounds: Normal breath sounds.  Musculoskeletal:     Cervical back: Normal range of motion.  Skin:    Capillary Refill: Capillary refill takes less than 2 seconds.  Neurological:     Mental Status: She is alert and oriented to person, place, and time.     UC Treatments / Results  Labs (all labs ordered are listed, but only abnormal results are displayed) Labs Reviewed - No data to display  EKG  Radiology No results  found.  Procedures Procedures (including critical care time)  Medications Ordered in UC Medications - No data to display  Initial Impression / Assessment and Plan / UC Course  I have reviewed the triage vital signs and the nursing notes.  Pertinent labs & imaging results that were available during my care of the patient were reviewed by me and considered in my medical decision making (see chart for details).  Cover for dental infection with augmentin BID x 7 days Ibu and tylenol for pain control, lidocaine swish and spit Provided with list of dental resources, she will call to see if sooner appointment. Otherwise will keep her July appt.  Advised ED precautions for severe or worsening symptoms    Referral to pain clinic placed for her chronic back pain  Final Clinical Impressions(s) / UC Diagnoses   Final diagnoses:  Pain, dental  Dental infection  Chronic back pain, unspecified back location, unspecified back pain laterality     Discharge Instructions      Please take antibiotic as prescribed. Take with food to avoid upset stomach. Finish the full course  Ibuprofen and tylenol for pain Lidocaine swish and spit as often as needed for numbing List of dentists on last page  Referral placed to pain clinic. You can call the office to set up appointment if you don't hear from them next week     ED Prescriptions     Medication Sig Dispense Auth.  Provider            PDMP not reviewed this encounter.   Marlow Baars, [place] 09/30/22 1459

## 2022-09-30 NOTE — ED Provider Notes (Deleted)
[facility]    CSN: [id number] Arrival date & time: 09/30/22  1036     History   Chief Complaint Chief Complaint  Patient presents with   Dental Pain    HPI Yvonne [name] is a 37 y.o. female.    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 - tx w/otc med   Asthma    albuterol inhaler   Blood transfusion    2008 with c/s 4 units transfused   Carpal tunnel syndrome on right    Chronic back pain    Depression    Dyspnea    exertion   Headache(784.0)    otc ibuprofen 800mg    Heart murmur    as a child   Obesity    Pneumonia    as a child   Thyroid disease     Patient Active Problem List   Diagnosis Date Noted   S/P lumbar spinal fusion 02/13/2017   Herniated nucleus pulposus, lumbar 04/10/2016   HNP (herniated nucleus pulposus), lumbar 11/05/2014    Class: Chronic   S/P endometrial ablation - HTA 2012 06/03/2012   Sterilization - h/o BTL 06/03/2012   OBESITY 09/03/2007   MIGRAINE HEADACHE 09/03/2007    Past Surgical History:  Procedure Laterality Date   ABDOMINAL HYSTERECTOMY N/A 09/29/2012   Procedure: HYSTERECTOMY ABDOMINAL;  Surgeon: Purcell Nails, MD;  Location: [facility];  Service: Gynecology;  Laterality: N/A;   APPENDECTOMY     BACK SURGERY     BILATERAL SALPINGECTOMY Bilateral 09/29/2012   Procedure: BILATERAL SALPINGECTOMY;  Surgeon: Purcell Nails, MD;  Location: [facility];  Service: Gynecology;  Laterality: Bilateral;   CESAREAN SECTION     CHOLECYSTECTOMY     CYSTOSCOPY Bilateral 09/29/2012   Procedure: CYSTOSCOPY;  Surgeon: Purcell Nails, MD;  Location: [facility];  Service: Gynecology;  Laterality: Bilateral;   ENDOMETRIAL ABLATION     EXAMINATION UNDER ANESTHESIA N/A 10/16/2012   Procedure: EXAM UNDER ANESTHESIA;  Surgeon: Purcell Nails, MD;  Location: [facility];  Service: Gynecology;  Laterality: N/A;  Suturing Vaginal Cuff   HAND SURGERY Right    carpal  tunnel   LAPAROSCOPIC ASSISTED VAGINAL HYSTERECTOMY N/A 09/29/2012   Procedure: LAPAROSCOPIC ASSISTED VAGINAL HYSTERECTOMY;  Surgeon: Purcell Nails, MD;  Location: [facility];  Service: Gynecology;  Laterality: N/A;   LAPAROSCOPIC HYSTERECTOMY Bilateral 09/29/2012   Procedure: HYSTERECTOMY TOTAL LAPAROSCOPIC;  Surgeon: Purcell Nails, MD;  Location: [facility];  Service: Gynecology;  Laterality: Bilateral;  Total Laparoscopic Hysterectomy, Bilateral Salpingectomies, Cystoscopy poss. LAVH poss. TAH   LAPAROSCOPY     x3   LUMBAR LAMINECTOMY N/A 11/05/2014   Procedure: RIGHT L4-5 MICRODISCECTOMY ;  Surgeon: Kerrin Champagne, MD;  Location: [facility];  Service: Orthopedics;  Laterality: N/A;   LUMBAR LAMINECTOMY/DECOMPRESSION MICRODISCECTOMY N/A 04/10/2016   Procedure: Right L4-5 Microdiscectomy;  Surgeon: Kerrin Champagne, MD;  Location: [facility];  Service: Orthopedics;  Laterality: N/A;   THYROIDECTOMY     THYROIDECTOMY     TONSILLECTOMY     T & A   TRANSFORAMINAL LUMBAR INTERBODY FUSION (TLIF) WITH PEDICLE SCREW FIXATION 1 LEVEL N/A 02/13/2017   Procedure: Transforaminal Lumbar Interbody Fusion - Lumbar four-five;  Surgeon: Tia Alert, MD;  Location: [facility];  Service: Neurosurgery;  Laterality: N/A;   TUBAL LIGATION      OB History  Gravida  4   Para  4   Term  4   Preterm      AB      Living  1      SAB      IAB      Ectopic      Multiple      Live Births  1            Home Medications    Prior to Admission medications   Medication Sig Start Date End Date Taking? Authorizing Provider  albuterol (PROVENTIL HFA) 108 (90 Base) MCG/ACT inhaler Inhale 2 puffs into the lungs every 4 (four) hours as needed for wheezing or shortness of breath. 07/03/22  Yes Sharman Cheek, MD  furosemide (LASIX) 40 MG tablet Take 1 tablet (40 mg total) by mouth daily. 09/08/22  Yes Wallis Bamberg, PA-C  hydrOXYzine (ATARAX) 50 MG tablet Take 50 mg by mouth 3 (three) times daily. 06/30/21  Yes [provider]  levothyroxine (SYNTHROID) 75 MCG tablet Take 75 mcg by mouth daily before breakfast.   Yes [provider]  potassium chloride (KLOR-CON) 10 MEQ tablet Take 1 tablet (10 mEq total) by mouth daily. 09/08/22  Yes Wallis Bamberg, PA-C  QUEtiapine (SEROQUEL) 400 MG tablet Take 450 mg by mouth at bedtime.   Yes [provider]  SUMAtriptan (IMITREX) 20 MG/ACT nasal spray Place 1 spray (20 mg total) into the nose as needed for migraine or headache. May repeat in 2 hours if headache persists or recurs.  Use nasal spray on the same side as the headache. 07/03/22 07/03/23 Yes Sharman Cheek, MD    Family History Family History  Problem Relation Age of Onset   Diabetes Mother    Diabetes Maternal Grandmother    Stroke Father     Social History Social History   Tobacco Use   Smoking status: Former    Packs/day: 1    Types: Cigarettes    Quit date: 10/17/2015    Years since quitting: 6.9   Smokeless tobacco: Never  Vaping Use   Vaping Use: Never used  Substance Use Topics   Alcohol use: Yes    Alcohol/week: 1.0 standard drink of alcohol    Types: 1 Glasses of wine per week    Comment: ocassionally   Drug use: No     Allergies   Aspirin, Bee venom, Metoclopramide, Reglan [metoclopramide hcl], and Metronidazole   Review of Systems Review of Systems Per HPI  Physical Exam   Updated Vital Signs BP 128/81 (BP Location: Left Arm)   Pulse 89   Temp 97.9 F (36.6 C) (Oral)   Resp 18   LMP 07/25/2012   SpO2 98%   Physical Exam   UC Treatments / Results  Labs (all labs ordered are listed, but only abnormal results are displayed) Labs Reviewed - No data to display  EKG  Radiology No results found.  Procedures Procedures (including critical care time)  Medications Ordered in UC Medications - No data to display  Initial Impression / Assessment and Plan / UC Course  I have reviewed the triage vital signs and the nursing notes.  Pertinent  labs & imaging results that were available during my care of the patient were reviewed by me and considered in my medical decision making (see chart for details).  Final Clinical Impressions(s) / UC Diagnoses   Final diagnoses:  None   Discharge Instructions   None       PDMP not  reviewed this encounter.    Marlow Baars, [place] 09/30/22 1244

## 2022-09-30 NOTE — Discharge Instructions (Addendum)
Please take antibiotic as prescribed. Take with food to avoid upset stomach. Finish the full course  Ibuprofen and tylenol for pain Lidocaine swish and spit as often as needed for numbing List of dentists on last page  Referral placed to pain clinic. You can call the office to set up appointment if you don't hear from them next week

## 2022-09-30 NOTE — ED Triage Notes (Addendum)
Pt presents to the office for dental pain. Pt has a dental appt schedule for 10/31/2022.

## 2022-11-10 ENCOUNTER — Other Ambulatory Visit: Payer: Self-pay

## 2022-11-10 ENCOUNTER — Encounter (HOSPITAL_COMMUNITY): Payer: Self-pay

## 2022-11-10 ENCOUNTER — Emergency Department (HOSPITAL_COMMUNITY)
Admission: EM | Admit: 2022-11-10 | Discharge: 2022-11-10 | Disposition: A | Discharge status: Home / Self Care | Payer: Medicaid Other | Attending: Student | Admitting: Student

## 2022-11-10 ENCOUNTER — Emergency Department (HOSPITAL_COMMUNITY): Payer: Medicaid Other

## 2022-11-10 DIAGNOSIS — K625 Hemorrhage of anus and rectum: Secondary | ICD-10-CM | POA: Diagnosis not present

## 2022-11-10 DIAGNOSIS — R1033 Periumbilical pain: Secondary | ICD-10-CM | POA: Diagnosis present

## 2022-11-10 LAB — CBC
HCT: 36.8 % (ref 36.0–46.0)
Hemoglobin: 11.9 g/dL — ABNORMAL LOW (ref 12.0–15.0)
MCH: 28.6 pg (ref 26.0–34.0)
MCHC: 32.3 g/dL (ref 30.0–36.0)
MCV: 88.5 fL (ref 80.0–100.0)
Platelets: 399 10*3/uL (ref 150–400)
RBC: 4.16 MIL/uL (ref 3.87–5.11)
RDW: 14.4 % (ref 11.5–15.5)
WBC: 11.4 10*3/uL — ABNORMAL HIGH (ref 4.0–10.5)
nRBC: 0 % (ref 0.0–0.2)

## 2022-11-10 LAB — URINALYSIS, ROUTINE W REFLEX MICROSCOPIC
Bacteria, UA: NONE SEEN
Bilirubin Urine: NEGATIVE
Glucose, UA: NEGATIVE mg/dL
Ketones, ur: NEGATIVE mg/dL
Leukocytes,Ua: NEGATIVE
Nitrite: NEGATIVE
Protein, ur: NEGATIVE mg/dL
Specific Gravity, Urine: 1.042 — ABNORMAL HIGH (ref 1.005–1.030)
pH: 8 (ref 5.0–8.0)

## 2022-11-10 LAB — COMPREHENSIVE METABOLIC PANEL
ALT: 30 U/L (ref 0–44)
AST: 28 U/L (ref 15–41)
Albumin: 3.4 g/dL — ABNORMAL LOW (ref 3.5–5.0)
Alkaline Phosphatase: 113 U/L (ref 38–126)
Anion gap: 11 (ref 5–15)
BUN: 5 mg/dL — ABNORMAL LOW (ref 6–20)
CO2: 25 mmol/L (ref 22–32)
Calcium: 8.7 mg/dL — ABNORMAL LOW (ref 8.9–10.3)
Chloride: 102 mmol/L (ref 98–111)
Creatinine, Ser: 1.18 mg/dL — ABNORMAL HIGH (ref 0.44–1.00)
GFR, Estimated: >60 mL/min (ref >60)
Glucose, Bld: 125 mg/dL — ABNORMAL HIGH (ref 70–99)
Potassium: 3.3 mmol/L — ABNORMAL LOW (ref 3.5–5.1)
Sodium: 138 mmol/L (ref 135–145)
Total Bilirubin: 0.3 mg/dL (ref 0.3–1.2)
Total Protein: 6.4 g/dL — ABNORMAL LOW (ref 6.5–8.1)

## 2022-11-10 LAB — HEMOGLOBIN AND HEMATOCRIT, BLOOD
HCT: 34.1 % — ABNORMAL LOW (ref 36.0–46.0)
HCT: 35 % — ABNORMAL LOW (ref 36.0–46.0)
HCT: 35.9 % — ABNORMAL LOW (ref 36.0–46.0)
Hemoglobin: 11.1 g/dL — ABNORMAL LOW (ref 12.0–15.0)
Hemoglobin: 11.3 g/dL — ABNORMAL LOW (ref 12.0–15.0)
Hemoglobin: 11.7 g/dL — ABNORMAL LOW (ref 12.0–15.0)

## 2022-11-10 LAB — TYPE AND SCREEN
ABO/RH(D): A POS
Antibody Screen: NEGATIVE

## 2022-11-10 LAB — POC OCCULT BLOOD, ED: Fecal Occult Bld: NEGATIVE

## 2022-11-10 MED ORDER — ALUM & MAG HYDROXIDE-SIMETH 200-200-20 MG/5ML PO SUSP
30.0000 mL | Freq: Once | ORAL | Status: AC
  Administered 2022-11-10: 30 mL via ORAL
  Filled 2022-11-10: qty 30

## 2022-11-10 MED ORDER — ONDANSETRON HCL 4 MG/2ML IJ SOLN
4.0000 mg | Freq: Once | INTRAMUSCULAR | Status: AC
  Administered 2022-11-10: 4 mg via INTRAVENOUS
  Filled 2022-11-10: qty 2

## 2022-11-10 MED ORDER — ONDANSETRON HCL 4 MG PO TABS: 4.0000 mg | ORAL_TABLET | Freq: Three times a day (TID) | ORAL | 0 refills | Status: AC | PRN

## 2022-11-10 MED ORDER — ALUM & MAG HYDROXIDE-SIMETH 200-200-20 MG/5ML PO SUSP: 30.0000 mL | Freq: Once | ORAL | Status: DC

## 2022-11-10 MED ORDER — ONDANSETRON 4 MG PO TBDP
4.0000 mg | ORAL_TABLET | Freq: Once | ORAL | Status: AC
  Administered 2022-11-10: 4 mg via ORAL
  Filled 2022-11-10: qty 1

## 2022-11-10 MED ORDER — DICYCLOMINE HCL 10 MG PO CAPS
10.0000 mg | ORAL_CAPSULE | Freq: Once | ORAL | Status: AC
  Administered 2022-11-10: 10 mg via ORAL
  Filled 2022-11-10: qty 1

## 2022-11-10 MED ORDER — DICYCLOMINE HCL 20 MG PO TABS
20.0000 mg | ORAL_TABLET | Freq: Two times a day (BID) | ORAL | 0 refills | Status: DC
Start: 2022-11-10 — End: 2024-01-21

## 2022-11-10 MED ORDER — SODIUM CHLORIDE (PF) 0.9 % IJ SOLN
INTRAMUSCULAR | Status: AC
  Filled 2022-11-10: qty 50

## 2022-11-10 MED ORDER — MORPHINE SULFATE (PF) 4 MG/ML IV SOLN
4.0000 mg | Freq: Once | INTRAVENOUS | Status: AC
  Administered 2022-11-10: 4 mg via INTRAVENOUS
  Filled 2022-11-10: qty 1

## 2022-11-10 MED ORDER — IOHEXOL 300 MG/ML  SOLN
100.0000 mL | Freq: Once | INTRAMUSCULAR | Status: AC | PRN
  Administered 2022-11-10: 100 mL via INTRAVENOUS

## 2022-11-10 NOTE — ED Notes (Signed)
Last CH was disc

## 2022-11-10 NOTE — Discharge Instructions (Addendum)
As discussed, your CT was unremarkable. There wasn't any signs of inflammation to the bowels, which could cause GI bleeding. I have sent in a prescription for Zofran and Dicyclomine to your pharmacy. You can take Zofran up to 3 times a day for nausea and Dicyclomine up to 2 times a day for pain.  Call your GI specialist on Monday. Schedule a follow up appointment in 3-5 days for reevaluation of your symptoms.   Get help right away if: You faint. You have very bad pain in your butt. You have bleeding and feel weak, dizzy, or faint.

## 2022-11-10 NOTE — ED Triage Notes (Signed)
Pt arrived via POV. C/o rectal bleeding for 1x day. Pt has hx of similar, sched for colonoscopy in Sep. Pt states abd feels tight.  AOx4

## 2022-11-10 NOTE — ED Provider Notes (Signed)
[place] [facility] AT [facility] Provider Note   CSN: [id number] Arrival date & time: 11/10/22  1046     History  Chief Complaint  Patient presents with   Rectal Bleeding   Abdominal Pain    Yvonne [name] is a 37 y.o. female with a history of migraines and rectal bleeding who presents to the ED today for abdominal pain.  Patient reports she developed new onset abdominal pain last night which has persisted since.  Additionally, patient reports that she had bright red blood in her stools twice since the onset of pain.  Patient reports that she last had bleeding in her stools about 4 months ago and has not had any since then. She did not have abdominal pain at that time.  She denies any fever, chills, dysuria, or back pain.  Reports increased pain in abdomen when she tries to have a bowel movement.  She scheduled for a colonoscopy with GI in September.  No dizziness, weakness, or headache.  Denies regular NSAID or alcohol use.  No other complaints or concerns at this time.    Home Medications Prior to Admission medications   Medication Sig Start Date End Date Taking? Authorizing Provider  dicyclomine (BENTYL) 20 MG tablet Take 1 tablet (20 mg total) by mouth 2 (two) times daily for 14 days. 11/10/22 11/24/22 Yes Maxwell Marion, PA-C  ondansetron (ZOFRAN) 4 MG tablet Take 1 tablet (4 mg total) by mouth every 8 (eight) hours as needed for nausea or vomiting. 11/10/22 12/10/22 Yes Maxwell Marion, PA-C  albuterol (PROVENTIL HFA) 108 (90 Base) MCG/ACT inhaler Inhale 2 puffs into the lungs every 4 (four) hours as needed for wheezing or shortness of breath. 07/03/22   Sharman Cheek, MD  furosemide (LASIX) 40 MG tablet Take 1 tablet (40 mg total) by mouth daily. 09/08/22   Wallis Bamberg, PA-C  hydrOXYzine (ATARAX) 50 MG tablet Take 50 mg by mouth 3 (three) times daily. 06/30/21   [provider]  ibuprofen (ADVIL) 800 MG tablet Take 1 tablet (800 mg total) by mouth 3  (three) times daily. 09/30/22   Rising, Lurena Joiner, PA-C  levothyroxine (SYNTHROID) 75 MCG tablet Take 75 mcg by mouth daily before breakfast.    [provider]  lidocaine (XYLOCAINE) 2 % solution Use as directed 15 mLs in the mouth or throat every 3 (three) hours as needed for mouth pain. Swish/gargle and spit out 09/30/22   Rising, Lurena Joiner, PA-C  potassium chloride (KLOR-CON) 10 MEQ tablet Take 1 tablet (10 mEq total) by mouth daily. 09/08/22   Wallis Bamberg, PA-C  QUEtiapine (SEROQUEL) 400 MG tablet Take 450 mg by mouth at bedtime.    [provider]  SUMAtriptan (IMITREX) 20 MG/ACT nasal spray Place 1 spray (20 mg total) into the nose as needed for migraine or headache. May repeat in 2 hours if headache persists or recurs.  Use nasal spray on the same side as the headache. 07/03/22 07/03/23  Sharman Cheek, MD      Allergies    Aspirin, Bee venom, Metoclopramide, Reglan [metoclopramide hcl], and Metronidazole    Review of Systems   Review of Systems  Gastrointestinal:  Positive for abdominal pain.  All other systems reviewed and are negative.   Physical Exam Updated Vital Signs BP (!) 152/93   Pulse 86   Temp 98 F (36.7 C)   Resp 18   Ht 5' (1.524 m)   Wt 108.9 kg   LMP 07/25/2012   SpO2 100%  BMI 46.87 kg/m  Physical Exam Vitals and nursing note reviewed.  Constitutional:      Appearance: Normal appearance.  HENT:     Head: Normocephalic and atraumatic.     Mouth/Throat:     Mouth: Mucous membranes are moist.  Eyes:     Conjunctiva/sclera: Conjunctivae normal.     Pupils: Pupils are equal, round, and reactive to light.  Cardiovascular:     Rate and Rhythm: Normal rate and regular rhythm.     Pulses: Normal pulses.     Heart sounds: Normal heart sounds.  Pulmonary:     Effort: Pulmonary effort is normal.     Breath sounds: Normal breath sounds.  Abdominal:     Palpations: Abdomen is soft.     Tenderness: There is abdominal tenderness. There is no right  CVA tenderness, left CVA tenderness, guarding or rebound.     Comments: Periumbilical tenderness  Skin:    General: Skin is warm and dry.     Findings: No rash.  Neurological:     General: No focal deficit present.     Mental Status: She is alert.     Sensory: No sensory deficit.     Motor: No weakness.  Psychiatric:        Mood and Affect: Mood normal.        Behavior: Behavior normal.     ED Results / Procedures / Treatments   Labs (all labs ordered are listed, but only abnormal results are displayed) Labs Reviewed  COMPREHENSIVE METABOLIC PANEL - Abnormal; Notable for the following components:      Result Value   Potassium 3.3 (*)    Glucose, Bld 125 (*)    BUN 5 (*)    Creatinine, Ser 1.18 (*)    Calcium 8.7 (*)    Total Protein 6.4 (*)    Albumin 3.4 (*)    All other components within normal limits  CBC - Abnormal; Notable for the following components:   WBC 11.4 (*)    Hemoglobin 11.9 (*)    All other components within normal limits  URINALYSIS, ROUTINE W REFLEX MICROSCOPIC - Abnormal; Notable for the following components:   Specific Gravity, Urine 1.042 (*)    Hgb urine dipstick SMALL (*)    All other components within normal limits  HEMOGLOBIN AND HEMATOCRIT, BLOOD - Abnormal; Notable for the following components:   Hemoglobin 11.1 (*)    HCT 34.1 (*)    All other components within normal limits  HEMOGLOBIN AND HEMATOCRIT, BLOOD - Abnormal; Notable for the following components:   Hemoglobin 11.3 (*)    HCT 35.0 (*)    All other components within normal limits  HEMOGLOBIN AND HEMATOCRIT, BLOOD - Abnormal; Notable for the following components:   Hemoglobin 11.7 (*)    HCT 35.9 (*)    All other components within normal limits  POC OCCULT BLOOD, ED  TYPE AND SCREEN    EKG None  Radiology CT ABDOMEN PELVIS W CONTRAST  Result Date: 11/10/2022 CLINICAL DATA:  Abdominal pain EXAM: CT ABDOMEN AND PELVIS WITH CONTRAST TECHNIQUE: Multidetector CT imaging of  the abdomen and pelvis was performed using the standard protocol following bolus administration of intravenous contrast. RADIATION DOSE REDUCTION: This exam was performed according to the departmental dose-optimization program which includes automated exposure control, adjustment of the mA and/or kV according to patient size and/or use of iterative reconstruction technique. CONTRAST:  OMNIPAQUE IOHEXOL 300 MG/ML  SOLN COMPARISON:  None Available. FINDINGS: Lower  chest: Lung bases are clear. Hepatobiliary: No focal hepatic lesion. Postcholecystectomy. No biliary dilatation. Pancreas: Pancreas is normal. No ductal dilatation. No pancreatic inflammation. Spleen: Normal spleen Adrenals/urinary tract: Adrenal glands and kidneys are normal. The ureters and bladder normal. Stomach/Bowel: Stomach, small bowel, appendix, and cecum are normal. The colon and rectosigmoid colon are normal. Vascular/Lymphatic: Abdominal aorta is normal caliber. No periportal or retroperitoneal adenopathy. No pelvic adenopathy. Reproductive: Post hysterectomy.  Adnexa unremarkable Other: No free fluid. Musculoskeletal: Posterior lumbar fusion IMPRESSION: 1. No acute findings in the abdomen pelvis. 2. Normal appendix. 3. Post cholecystectomy. Electronically Signed   By: Genevive Bi M.D.   On: 11/10/2022 14:10    Procedures Procedures: not indicated.   Medications Ordered in ED Medications  ondansetron (ZOFRAN) injection 4 mg (4 mg Intravenous Given 11/10/22 1328)  morphine (PF) 4 MG/ML injection 4 mg (4 mg Intravenous Given 11/10/22 1329)  iohexol (OMNIPAQUE) 300 MG/ML solution 100 mL (100 mLs Intravenous Contrast Given 11/10/22 1337)  alum & mag hydroxide-simeth (MAALOX/MYLANTA) 200-200-20 MG/5ML suspension 30 mL (30 mLs Oral Given 11/10/22 1542)  ondansetron (ZOFRAN-ODT) disintegrating tablet 4 mg (4 mg Oral Given 11/10/22 1728)  dicyclomine (BENTYL) capsule 10 mg (10 mg Oral Given 11/10/22 1955)    ED Course/ Medical  Decision Making/ A&P                                 Medical Decision Making Amount and/or Complexity of Data Reviewed Labs: ordered. Radiology: ordered.  Risk OTC drugs. Prescription drug management.   This patient presents to the ED for concern of abdominal pain and rectal bleeding, this involves an extensive number of treatment options, and is a complaint that carries with it a high risk of complications and morbidity.   Differential diagnosis includes: diverticulitis, gastric ulcers,    Co morbidities that complicate the patient evaluation  History of rectal bleeding   Additional history  Additional history obtained from patient's previous records and GI notes.   Lab Tests  I ordered and personally interpreted labs.  The pertinent results include:   CMP is within normal limits - no acute AKI or electrolyte abnormality. Hgb of 11.9 on CBC, otherwise within normal limits. FOBT is negative. UA shows no acute infection. Repeat H&H of 11.1 about 4 hours later. Repeat H&H at 8 PM (4 hours later) was 11.7.   Imaging Studies  I ordered imaging studies including CT abdomen and pelvis with contrast  I independently visualized and interpreted imaging which showed: no acute findings. I agree with the radiologist interpretation   Problem List / ED Course / Critical interventions / Medication management  Abdominal pain and rectal bleeding I ordered medications including:  Morphine for pain  Reevaluation of the patient after these medicines showed that the patient improved We then tried Maalox but patient denies relief with that. I gave patient Dicyclomine prior to discharge since she is driving herself home. I have reviewed the patients home medicines and have made adjustments as needed   Social Determinants of Health  Tobacco use   Test / Admission - Considered  Discussed results with patient. All questions were answered. She is hemodynamically stable and  vitals are within normal limits. Safe for discharge home. Prescriptions sent to the pharmacy. Return precautions given.       Final Clinical Impression(s) / ED Diagnoses Final diagnoses:  Rectal bleeding  Periumbilical abdominal pain    Rx / DC Orders  ED Discharge Orders          Ordered    ondansetron (ZOFRAN) 4 MG tablet  Every 8 hours PRN        11/10/22 2027    dicyclomine (BENTYL) 20 MG tablet  2 times daily        11/10/22 2030              Maxwell Marion, PA-C 11/10/22 2035    Terald Sleeper, MD 11/10/22 2051

## 2022-12-08 ENCOUNTER — Encounter (HOSPITAL_COMMUNITY): Payer: Self-pay

## 2022-12-08 ENCOUNTER — Ambulatory Visit (HOSPITAL_COMMUNITY)
Admission: EM | Admit: 2022-12-08 | Discharge: 2022-12-08 | Disposition: A | Discharge status: Home / Self Care | Payer: Medicaid Other | Attending: Physician Assistant | Admitting: Physician Assistant

## 2022-12-08 DIAGNOSIS — N898 Other specified noninflammatory disorders of vagina: Secondary | ICD-10-CM

## 2022-12-08 DIAGNOSIS — G43009 Migraine without aura, not intractable, without status migrainosus: Secondary | ICD-10-CM

## 2022-12-08 MED ORDER — DEXAMETHASONE SODIUM PHOSPHATE 10 MG/ML IJ SOLN
INTRAMUSCULAR | Status: AC
  Filled 2022-12-08: qty 1

## 2022-12-08 MED ORDER — BACLOFEN 10 MG PO TABS: 10.0000 mg | ORAL_TABLET | Freq: Two times a day (BID) | ORAL | 0 refills | Status: DC | PRN

## 2022-12-08 MED ORDER — DEXAMETHASONE SODIUM PHOSPHATE 10 MG/ML IJ SOLN
10.0000 mg | Freq: Once | INTRAMUSCULAR | Status: AC
  Administered 2022-12-08: 10 mg via INTRAMUSCULAR

## 2022-12-08 MED ORDER — METRONIDAZOLE 0.75 % VA GEL: 1.0000 | Freq: Every day | VAGINAL | 0 refills | Status: DC

## 2022-12-08 NOTE — Discharge Instructions (Signed)
We gave an injection today.  I am glad that this is making you feel better.  You can continue over-the-counter medication including Tylenol ibuprofen for pain relief.  Take baclofen for headache.  This make you sleepy so do not drive or drink alcohol while taking it.  Follow-up with your neurologist.  If you have any worsening symptoms you need to be seen immediately including the worst attack of your life, nausea/vomiting interfering with oral intake, vision change.  We are treating you for bacterial vaginosis with metronidazole gel.  Do not drink any alcohol while on this medication for 3 days after completing course.  If your symptoms do not resolve or if anything changes please return for reevaluation.

## 2022-12-08 NOTE — ED Triage Notes (Signed)
Patient presents with a migraine x day 2. Patient states she treated with Ibuprofen and Imitrex without relief.  Patient also want treatment for BV.

## 2022-12-08 NOTE — ED Provider Notes (Signed)
[facility]    CSN: [id number] Arrival date & time: 12/08/22  1404      History   Chief Complaint No chief complaint on file.   HPI Yvonne [name] is a 37 y.o. female.   Patient presents today with a 2-day history of recurrent migraine.  She has a history of migraines and states current symptoms are similar to previous episodes of this condition but not responding to her over-the-counter abortive medications.  She does follow with neurology but has never had any imaging.  She does not take prophylactic medication.  She has tried Imitrex and ibuprofen with last dose of ibuprofen a few hours ago.  She reports generally feeling weak with associated photophobia, phonophobia, nausea.  Denies any vomiting, visual disturbance, dysarthria, focal weakness, altered level of consciousness.  This is not the worst headache of her life.  She reports pain is localized to bilateral temples, rated 8/9 on a 0-10 pain scale, described as throbbing, worse with activity, no alleviating factors identified.  She is confident that she is not pregnant.  Denies any recent head injury or medication changes.  She denies any recent illness or additional symptoms such as cough, congestion, fever.  In addition, she is requesting treatment for bacterial vaginosis.  Reports vaginal discharge and odor starting a few days ago.  She denies any changes to personal hygiene products including soaps or detergents.  She does have a history of recurrent BV with similar presentation.  She has no concern for STI and declines testing today.  She does have nausea with metronidazole but has taken MetroGel in the past.    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 - tx w/otc med   Asthma    albuterol inhaler   Blood transfusion    2008 with c/s 4 units transfused   Carpal tunnel syndrome on right    Chronic back pain    Depression    Dyspnea     exertion   Headache(784.0)    otc ibuprofen 800mg    Heart murmur    as a child   Obesity    Pneumonia    as a child   Thyroid disease     Patient Active Problem List   Diagnosis Date Noted   S/P lumbar spinal fusion 02/13/2017   Herniated nucleus pulposus, lumbar 04/10/2016   HNP (herniated nucleus pulposus), lumbar 11/05/2014    Class: Chronic   S/P endometrial ablation - HTA 2012 06/03/2012   Sterilization - h/o BTL 06/03/2012   OBESITY 09/03/2007   MIGRAINE HEADACHE 09/03/2007    Past Surgical History:  Procedure Laterality Date   ABDOMINAL HYSTERECTOMY N/A 09/29/2012   Procedure: HYSTERECTOMY ABDOMINAL;  Surgeon: Purcell Nails, MD;  Location: [facility];  Service: Gynecology;  Laterality: N/A;   APPENDECTOMY     BACK SURGERY     BILATERAL SALPINGECTOMY Bilateral 09/29/2012   Procedure: BILATERAL SALPINGECTOMY;  Surgeon: Purcell Nails, MD;  Location: [facility];  Service: Gynecology;  Laterality: Bilateral;   CESAREAN SECTION     CHOLECYSTECTOMY     CYSTOSCOPY Bilateral 09/29/2012   Procedure: CYSTOSCOPY;  Surgeon: Purcell Nails, MD;  Location: [facility];  Service: Gynecology;  Laterality: Bilateral;   ENDOMETRIAL ABLATION     EXAMINATION UNDER ANESTHESIA N/A 10/16/2012   Procedure: EXAM UNDER ANESTHESIA;  Surgeon: Purcell Nails, MD;  Location: [facility]  ORS;  Service: Gynecology;  Laterality: N/A;  Suturing Vaginal Cuff   HAND SURGERY Right    carpal tunnel   LAPAROSCOPIC ASSISTED VAGINAL HYSTERECTOMY N/A 09/29/2012   Procedure: LAPAROSCOPIC ASSISTED VAGINAL HYSTERECTOMY;  Surgeon: Purcell Nails, MD;  Location: [facility];  Service: Gynecology;  Laterality: N/A;   LAPAROSCOPIC HYSTERECTOMY Bilateral 09/29/2012   Procedure: HYSTERECTOMY TOTAL LAPAROSCOPIC;  Surgeon: Purcell Nails, MD;  Location: [facility];  Service: Gynecology;  Laterality: Bilateral;  Total Laparoscopic Hysterectomy, Bilateral Salpingectomies, Cystoscopy poss. LAVH poss. TAH   LAPAROSCOPY     x3   LUMBAR  LAMINECTOMY N/A 11/05/2014   Procedure: RIGHT L4-5 MICRODISCECTOMY ;  Surgeon: Kerrin Champagne, MD;  Location: [facility];  Service: Orthopedics;  Laterality: N/A;   LUMBAR LAMINECTOMY/DECOMPRESSION MICRODISCECTOMY N/A 04/10/2016   Procedure: Right L4-5 Microdiscectomy;  Surgeon: Kerrin Champagne, MD;  Location: [facility];  Service: Orthopedics;  Laterality: N/A;   THYROIDECTOMY     THYROIDECTOMY     TONSILLECTOMY     T & A   TRANSFORAMINAL LUMBAR INTERBODY FUSION (TLIF) WITH PEDICLE SCREW FIXATION 1 LEVEL N/A 02/13/2017   Procedure: Transforaminal Lumbar Interbody Fusion - Lumbar four-five;  Surgeon: Tia Alert, MD;  Location: [facility] OR;  Service: Neurosurgery;  Laterality: N/A;   TUBAL LIGATION      OB History     Gravida  4   Para  4   Term  4   Preterm      AB      Living  1      SAB      IAB      Ectopic      Multiple      Live Births  1            Home Medications    Prior to Admission medications   Medication Sig Start Date End Date Taking? Authorizing Provider  baclofen (LIORESAL) 10 MG tablet Take 1 tablet (10 mg total) by mouth 2 (two) times daily as needed for muscle spasms. 12/08/22  Yes Yousra Ivens K, PA-C  metroNIDAZOLE (METROGEL) 0.75 % vaginal gel Place 1 Applicatorful vaginally at bedtime. 12/08/22  Yes Deeya Richeson K, PA-C  albuterol (PROVENTIL HFA) 108 (90 Base) MCG/ACT inhaler Inhale 2 puffs into the lungs every 4 (four) hours as needed for wheezing or shortness of breath. 07/03/22   Sharman Cheek, MD  dicyclomine (BENTYL) 20 MG tablet Take 1 tablet (20 mg total) by mouth 2 (two) times daily for 14 days. 11/10/22 11/24/22  Maxwell Marion, PA-C  furosemide (LASIX) 40 MG tablet Take 1 tablet (40 mg total) by mouth daily. 09/08/22   Wallis Bamberg, PA-C  hydrOXYzine (ATARAX) 50 MG tablet Take 50 mg by mouth 3 (three) times daily. 06/30/21   [provider]  ibuprofen (ADVIL) 800 MG tablet Take 1 tablet (800 mg total) by mouth 3 (three) times daily. 09/30/22    Rising, Lurena Joiner, PA-C  levothyroxine (SYNTHROID) 75 MCG tablet Take 75 mcg by mouth daily before breakfast.    [provider]  lidocaine (XYLOCAINE) 2 % solution Use as directed 15 mLs in the mouth or throat every 3 (three) hours as needed for mouth pain. Swish/gargle and spit out 09/30/22   Rising, Lurena Joiner, PA-C  ondansetron (ZOFRAN) 4 MG tablet Take 1 tablet (4 mg total) by mouth every 8 (eight) hours as needed for nausea or vomiting. 11/10/22 12/10/22  Maxwell Marion, PA-C  potassium chloride (KLOR-CON) 10 MEQ tablet Take 1 tablet (  10 mEq total) by mouth daily. 09/08/22   Wallis Bamberg, PA-C  QUEtiapine (SEROQUEL) 400 MG tablet Take 450 mg by mouth at bedtime.    [provider]  SUMAtriptan (IMITREX) 20 MG/ACT nasal spray Place 1 spray (20 mg total) into the nose as needed for migraine or headache. May repeat in 2 hours if headache persists or recurs.  Use nasal spray on the same side as the headache. 07/03/22 07/03/23  Sharman Cheek, MD    Family History Family History  Problem Relation Age of Onset   Diabetes Mother    Diabetes Maternal Grandmother    Stroke Father     Social History Social History   Tobacco Use   Smoking status: Former    Current packs/day: 0.00    Types: Cigarettes    Quit date: 10/17/2015    Years since quitting: 7.1   Smokeless tobacco: Never  Vaping Use   Vaping status: Never Used  Substance Use Topics   Alcohol use: Yes    Alcohol/week: 1.0 standard drink of alcohol    Types: 1 Glasses of wine per week    Comment: ocassionally   Drug use: No     Allergies   Aspirin, Bee venom, Metoclopramide, Reglan [metoclopramide hcl], and Metronidazole   Review of Systems Review of Systems  Constitutional:  Positive for activity change. Negative for appetite change, fatigue and fever.  Eyes:  Negative for photophobia and visual disturbance.  Respiratory:  Negative for cough and shortness of breath.   Cardiovascular:  Negative for chest pain.   Gastrointestinal:  Positive for nausea. Negative for abdominal pain, diarrhea and vomiting.  Genitourinary:  Positive for vaginal discharge. Negative for dysuria, frequency, pelvic pain, urgency, vaginal bleeding and vaginal pain.  Musculoskeletal:  Negative for arthralgias and myalgias.  Neurological:  Positive for headaches. Negative for dizziness, seizures, syncope, facial asymmetry, speech difficulty, weakness, light-headedness and numbness.     Physical Exam Triage Vital Signs ED Triage Vitals  Encounter Vitals Group     BP 12/08/22 1518 112/77     Systolic BP Percentile --      Diastolic BP Percentile --      Pulse Rate 12/08/22 1518 97     Resp 12/08/22 1518 17     Temp 12/08/22 1518 98.7 F (37.1 C)     Temp Source 12/08/22 1518 Oral     SpO2 12/08/22 1518 97 %     Weight 12/08/22 1519 265 lb (120.2 kg)     Height 12/08/22 1519 5' (1.524 m)     Head Circumference --      Peak Flow --      Pain Score 12/08/22 1519 9     Pain Loc --      Pain Education --      Exclude from Growth Chart --    No data found.  Updated Vital Signs BP 112/77 (BP Location: Left Arm)   Pulse 97   Temp 98.7 F (37.1 C) (Oral)   Resp 17   Ht 5' (1.524 m)   Wt 265 lb (120.2 kg)   LMP 07/25/2012   SpO2 97%   BMI 51.75 kg/m   Visual Acuity Right Eye Distance:   Left Eye Distance:   Bilateral Distance:    Right Eye Near:   Left Eye Near:    Bilateral Near:     Physical Exam Vitals reviewed.  Constitutional:      General: She is awake. She is not in acute distress.  Appearance: Normal appearance. She is well-developed. She is not ill-appearing.     Comments: Very pleasant female appears stated age in no acute distress sitting comfortably in exam room  HENT:     Head: Normocephalic and atraumatic. No raccoon eyes, Battle's sign or contusion.     Right Ear: Tympanic membrane, ear canal and external ear normal. No hemotympanum.     Left Ear: Tympanic membrane, ear canal and  external ear normal. No hemotympanum.     Nose: Nose normal.     Mouth/Throat:     Tongue: Tongue does not deviate from midline.     Pharynx: Uvula midline. No oropharyngeal exudate or posterior oropharyngeal erythema.  Eyes:     Extraocular Movements: Extraocular movements intact.     Conjunctiva/sclera: Conjunctivae normal.     Pupils: Pupils are equal, round, and reactive to light.  Cardiovascular:     Rate and Rhythm: Normal rate and regular rhythm.     Heart sounds: Normal heart sounds, S1 normal and S2 normal. No murmur heard. Pulmonary:     Effort: Pulmonary effort is normal.     Breath sounds: Normal breath sounds. No wheezing, rhonchi or rales.     Comments: Clear to auscultation bilaterally Musculoskeletal:     Cervical back: Normal range of motion and neck supple. No spinous process tenderness or muscular tenderness.     Comments: Strength 5/5 bilateral upper and lower extremities.  Neurological:     General: No focal deficit present.     Mental Status: She is alert and oriented to person, place, and time.     Cranial Nerves: Cranial nerves 2-12 are intact.     Motor: Motor function is intact.     Coordination: Coordination is intact.     Gait: Gait is intact.  Psychiatric:        Behavior: Behavior is cooperative.      UC Treatments / Results  Labs (all labs ordered are listed, but only abnormal results are displayed) Labs Reviewed - No data to display  EKG   Radiology No results found.  Procedures Procedures (including critical care time)  Medications Ordered in UC Medications  dexamethasone (DECADRON) injection 10 mg (10 mg Intramuscular Given 12/08/22 1601)    Initial Impression / Assessment and Plan / UC Course  I have reviewed the triage vital signs and the nursing notes.  Pertinent labs & imaging results that were available during my care of the patient were reviewed by me and considered in my medical decision making (see chart for details).      Patient is well-appearing, afebrile, nontoxic, nontachycardic.  Vital signs and physical exam are reassuring with no indication for emergent evaluation or imaging.  She was given Decadron in clinic with significant improvement of pain.  She was encouraged to continue over-the-counter medications as needed and prescription for baclofen was sent to pharmacy.  We discussed that this can be sedating and she is not to drive or drink alcohol while taking it.  Recommended that she rest and drink plenty of fluid.  She can use over-the-counter magnesium supplement as a prophylactic medication but also recommended following up with neurology for further evaluation and management including potentially imaging given her worsening symptoms.  We discussed at length that if she has persistent/worsening symptoms including worst headache of her life, vision change, nausea/vomiting, weakness she needs to be seen immediately.  Strict return precautions given.  Excuse note provided.  Patient reports that symptoms are consistent with previous episodes  of bacterial vaginosis.  She has no concern for STI and declined testing today.  Will empirically treat with MetroGel she is difficulty tolerating pill version of metronidazole.  We discussed that she should still abstain from alcohol while on this medication for 3 days after completing course due to associated Antabuse side effects.  She is to use hypoallergenic soaps and detergents and wear loosefitting cotton underwear.  Discussed that if her symptoms do not improve or if anything worsens she needs to be reevaluated.  Strict return precautions given.  All questions answered to patient satisfaction.  Final Clinical Impressions(s) / UC Diagnoses   Final diagnoses:  Migraine without aura and without status migrainosus, not intractable  Vaginal odor     Discharge Instructions      We gave an injection today.  I am glad that this is making you feel better.  You can  continue over-the-counter medication including Tylenol ibuprofen for pain relief.  Take baclofen for headache.  This make you sleepy so do not drive or drink alcohol while taking it.  Follow-up with your neurologist.  If you have any worsening symptoms you need to be seen immediately including the worst attack of your life, nausea/vomiting interfering with oral intake, vision change.  We are treating you for bacterial vaginosis with metronidazole gel.  Do not drink any alcohol while on this medication for 3 days after completing course.  If your symptoms do not resolve or if anything changes please return for reevaluation.     ED Prescriptions     Medication Sig Dispense Auth. Provider   baclofen (LIORESAL) 10 MG tablet Take 1 tablet (10 mg total) by mouth 2 (two) times daily as needed for muscle spasms. 30 each Alyn Riedinger K, PA-C   metroNIDAZOLE (METROGEL) 0.75 % vaginal gel Place 1 Applicatorful vaginally at bedtime. 70 g Ronae Noell, Noberto Retort, PA-C      PDMP not reviewed this encounter.   Jeani Hawking, PA-C 12/08/22 1650

## 2022-12-13 ENCOUNTER — Ambulatory Visit
Admission: EM | Admit: 2022-12-13 | Discharge: 2022-12-13 | Disposition: A | Discharge status: Home / Self Care | Payer: Medicaid Other | Attending: Internal Medicine | Admitting: Internal Medicine

## 2022-12-13 DIAGNOSIS — Z113 Encounter for screening for infections with a predominantly sexual mode of transmission: Secondary | ICD-10-CM | POA: Insufficient documentation

## 2022-12-13 NOTE — Discharge Instructions (Addendum)
The clinical contact for any positive results of the testing done today.  Follow-up as needed.

## 2022-12-13 NOTE — ED Triage Notes (Signed)
Pt presents to UC for std testing. Pt denies symptoms or known exposure.

## 2022-12-13 NOTE — ED Provider Notes (Signed)
[facility]    CSN: [id number] Arrival date & time: 12/13/22  0857      History   Chief Complaint No chief complaint on file.   HPI Yvonne [name] is a 37 y.o. female presents for STD screening.  Patient currently denies any symptoms including vaginal discharge, dysuria, fevers, nausea/vomiting, flank pain.  No known STD exposure but she would like screening.  She has no other concerns at this time.  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 - tx w/otc med   Asthma    albuterol inhaler   Blood transfusion    2008 with c/s 4 units transfused   Carpal tunnel syndrome on right    Chronic back pain    Depression    Dyspnea    exertion   Headache(784.0)    otc ibuprofen 800mg    Heart murmur    as a child   Obesity    Pneumonia    as a child   Thyroid disease     Patient Active Problem List   Diagnosis Date Noted   S/P lumbar spinal fusion 02/13/2017   Herniated nucleus pulposus, lumbar 04/10/2016   HNP (herniated nucleus pulposus), lumbar 11/05/2014    Class: Chronic   S/P endometrial ablation - HTA 2012 06/03/2012   Sterilization - h/o BTL 06/03/2012   OBESITY 09/03/2007   MIGRAINE HEADACHE 09/03/2007    Past Surgical History:  Procedure Laterality Date   ABDOMINAL HYSTERECTOMY N/A 09/29/2012   Procedure: HYSTERECTOMY ABDOMINAL;  Surgeon: Purcell Nails, MD;  Location: [facility];  Service: Gynecology;  Laterality: N/A;   APPENDECTOMY     BACK SURGERY     BILATERAL SALPINGECTOMY Bilateral 09/29/2012   Procedure: BILATERAL SALPINGECTOMY;  Surgeon: Purcell Nails, MD;  Location: [facility];  Service: Gynecology;  Laterality: Bilateral;   CESAREAN SECTION     CHOLECYSTECTOMY     CYSTOSCOPY Bilateral 09/29/2012   Procedure: CYSTOSCOPY;  Surgeon: Purcell Nails, MD;  Location: [facility];  Service: Gynecology;  Laterality: Bilateral;   ENDOMETRIAL ABLATION     EXAMINATION UNDER  ANESTHESIA N/A 10/16/2012   Procedure: EXAM UNDER ANESTHESIA;  Surgeon: Purcell Nails, MD;  Location: [facility];  Service: Gynecology;  Laterality: N/A;  Suturing Vaginal Cuff   HAND SURGERY Right    carpal tunnel   LAPAROSCOPIC ASSISTED VAGINAL HYSTERECTOMY N/A 09/29/2012   Procedure: LAPAROSCOPIC ASSISTED VAGINAL HYSTERECTOMY;  Surgeon: Purcell Nails, MD;  Location: [facility];  Service: Gynecology;  Laterality: N/A;   LAPAROSCOPIC HYSTERECTOMY Bilateral 09/29/2012   Procedure: HYSTERECTOMY TOTAL LAPAROSCOPIC;  Surgeon: Purcell Nails, MD;  Location: [facility];  Service: Gynecology;  Laterality: Bilateral;  Total Laparoscopic Hysterectomy, Bilateral Salpingectomies, Cystoscopy poss. LAVH poss. TAH   LAPAROSCOPY     x3   LUMBAR LAMINECTOMY N/A 11/05/2014   Procedure: RIGHT L4-5 MICRODISCECTOMY ;  Surgeon: Kerrin Champagne, MD;  Location: [facility];  Service: Orthopedics;  Laterality: N/A;   LUMBAR LAMINECTOMY/DECOMPRESSION MICRODISCECTOMY N/A 04/10/2016   Procedure: Right L4-5 Microdiscectomy;  Surgeon: Kerrin Champagne, MD;  Location: [facility];  Service: Orthopedics;  Laterality: N/A;   THYROIDECTOMY     THYROIDECTOMY     TONSILLECTOMY     T & A   TRANSFORAMINAL LUMBAR INTERBODY FUSION (TLIF) WITH PEDICLE SCREW FIXATION 1 LEVEL N/A 02/13/2017   Procedure: Transforaminal Lumbar Interbody Fusion -  Lumbar four-five;  Surgeon: Tia Alert, MD;  Location: [facility] OR;  Service: Neurosurgery;  Laterality: N/A;   TUBAL LIGATION      OB History     Gravida  4   Para  4   Term  4   Preterm      AB      Living  1      SAB      IAB      Ectopic      Multiple      Live Births  1            Home Medications    Prior to Admission medications   Medication Sig Start Date End Date Taking? Authorizing Provider  albuterol (PROVENTIL HFA) 108 (90 Base) MCG/ACT inhaler Inhale 2 puffs into the lungs every 4 (four) hours as needed for wheezing or shortness of breath. 07/03/22   Sharman Cheek, MD   baclofen (LIORESAL) 10 MG tablet Take 1 tablet (10 mg total) by mouth 2 (two) times daily as needed for muscle spasms. 12/08/22   Raspet, Noberto Retort, PA-C  dicyclomine (BENTYL) 20 MG tablet Take 1 tablet (20 mg total) by mouth 2 (two) times daily for 14 days. 11/10/22 11/24/22  Maxwell Marion, PA-C  furosemide (LASIX) 40 MG tablet Take 1 tablet (40 mg total) by mouth daily. 09/08/22   Wallis Bamberg, PA-C  hydrOXYzine (ATARAX) 50 MG tablet Take 50 mg by mouth 3 (three) times daily. 06/30/21   [provider]  ibuprofen (ADVIL) 800 MG tablet Take 1 tablet (800 mg total) by mouth 3 (three) times daily. 09/30/22   Rising, Lurena Joiner, PA-C  levothyroxine (SYNTHROID) 75 MCG tablet Take 75 mcg by mouth daily before breakfast.    [provider]  lidocaine (XYLOCAINE) 2 % solution Use as directed 15 mLs in the mouth or throat every 3 (three) hours as needed for mouth pain. Swish/gargle and spit out 09/30/22   Rising, Lurena Joiner, PA-C  metroNIDAZOLE (METROGEL) 0.75 % vaginal gel Place 1 Applicatorful vaginally at bedtime. 12/08/22   Raspet, Noberto Retort, PA-C  potassium chloride (KLOR-CON) 10 MEQ tablet Take 1 tablet (10 mEq total) by mouth daily. 09/08/22   Wallis Bamberg, PA-C  QUEtiapine (SEROQUEL) 400 MG tablet Take 450 mg by mouth at bedtime.    [provider]  SUMAtriptan (IMITREX) 20 MG/ACT nasal spray Place 1 spray (20 mg total) into the nose as needed for migraine or headache. May repeat in 2 hours if headache persists or recurs.  Use nasal spray on the same side as the headache. 07/03/22 07/03/23  Sharman Cheek, MD    Family History Family History  Problem Relation Age of Onset   Diabetes Mother    Diabetes Maternal Grandmother    Stroke Father     Social History Social History   Tobacco Use   Smoking status: Former    Current packs/day: 0.00    Types: Cigarettes    Quit date: 10/17/2015    Years since quitting: 7.1   Smokeless tobacco: Never  Vaping Use   Vaping status: Never Used   Substance Use Topics   Alcohol use: Yes    Alcohol/week: 1.0 standard drink of alcohol    Types: 1 Glasses of wine per week    Comment: ocassionally   Drug use: No     Allergies   Aspirin, Bee venom, Metoclopramide, Reglan [metoclopramide hcl], and Metronidazole   Review of Systems Review of Systems  Genitourinary:  STD screening     Physical Exam Triage Vital Signs ED Triage Vitals  Encounter Vitals Group     BP 12/13/22 0904 112/75     Systolic BP Percentile --      Diastolic BP Percentile --      Pulse Rate 12/13/22 0904 (!) 107     Resp 12/13/22 0904 16     Temp 12/13/22 0904 98 F (36.7 C)     Temp Source 12/13/22 0904 Oral     SpO2 12/13/22 0904 97 %     Weight --      Height --      Head Circumference --      Peak Flow --      Pain Score 12/13/22 0907 0     Pain Loc --      Pain Education --      Exclude from Growth Chart --    No data found.  Updated Vital Signs BP 112/75 (BP Location: Right Arm)   Pulse (!) 107   Temp 98 F (36.7 C) (Oral)   Resp 16   LMP 07/25/2012   SpO2 97%   Visual Acuity Right Eye Distance:   Left Eye Distance:   Bilateral Distance:    Right Eye Near:   Left Eye Near:    Bilateral Near:     Physical Exam Vitals and nursing note reviewed.  Constitutional:      Appearance: Normal appearance.  HENT:     Head: Normocephalic and atraumatic.  Eyes:     Pupils: Pupils are equal, round, and reactive to light.  Cardiovascular:     Rate and Rhythm: Normal rate.  Pulmonary:     Effort: Pulmonary effort is normal.  Abdominal:     Tenderness: There is no right CVA tenderness or left CVA tenderness.  Skin:    General: Skin is warm and dry.  Neurological:     General: No focal deficit present.     Mental Status: She is alert and oriented to person, place, and time.  Psychiatric:        Mood and Affect: Mood normal.        Behavior: Behavior normal.      UC Treatments / Results  Labs (all labs ordered are  listed, but only abnormal results are displayed) Labs Reviewed  CERVICOVAGINAL ANCILLARY ONLY    EKG   Radiology No results found.  Procedures Procedures (including critical care time)  Medications Ordered in UC Medications - No data to display  Initial Impression / Assessment and Plan / UC Course  I have reviewed the triage vital signs and the nursing notes.  Pertinent labs & imaging results that were available during my care of the patient were reviewed by me and considered in my medical decision making (see chart for details).  Clinical Course as of 12/13/22 0919  Thu Dec 13, 2022  0919 HR recheck 96 [JM]    Clinical Course User Index [JM] Radford Pax, NP    STD testing is ordered and will contact for any positive results.  Patient declined blood work.  Follow-up as needed. Final Clinical Impressions(s) / UC Diagnoses   Final diagnoses:  Screening examination for STD (sexually transmitted disease)     Discharge Instructions      The clinical contact for any positive results of the testing done today.  Follow-up as needed.   ED Prescriptions   None    PDMP not reviewed this encounter.   Stacie Acres,  Hipolito Bayley, NP 12/13/22 [phone]

## 2022-12-14 LAB — CERVICOVAGINAL ANCILLARY ONLY
Chlamydia: NEGATIVE
Comment: NEGATIVE
Comment: NEGATIVE
Comment: NORMAL
Neisseria Gonorrhea: NEGATIVE
Trichomonas: NEGATIVE

## 2023-07-05 ENCOUNTER — Emergency Department (HOSPITAL_COMMUNITY)

## 2023-07-05 ENCOUNTER — Other Ambulatory Visit: Payer: Self-pay

## 2023-07-05 ENCOUNTER — Emergency Department (HOSPITAL_COMMUNITY)
Admission: EM | Admit: 2023-07-05 | Discharge: 2023-07-05 | Disposition: A | Discharge status: Home / Self Care | Attending: Emergency Medicine | Admitting: Emergency Medicine

## 2023-07-05 ENCOUNTER — Encounter (HOSPITAL_COMMUNITY): Payer: Self-pay

## 2023-07-05 DIAGNOSIS — E876 Hypokalemia: Secondary | ICD-10-CM | POA: Diagnosis not present

## 2023-07-05 DIAGNOSIS — Z7951 Long term (current) use of inhaled steroids: Secondary | ICD-10-CM | POA: Diagnosis not present

## 2023-07-05 DIAGNOSIS — R0600 Dyspnea, unspecified: Secondary | ICD-10-CM | POA: Insufficient documentation

## 2023-07-05 DIAGNOSIS — J45909 Unspecified asthma, uncomplicated: Secondary | ICD-10-CM | POA: Diagnosis not present

## 2023-07-05 DIAGNOSIS — R079 Chest pain, unspecified: Secondary | ICD-10-CM | POA: Diagnosis not present

## 2023-07-05 DIAGNOSIS — R0602 Shortness of breath: Secondary | ICD-10-CM | POA: Diagnosis present

## 2023-07-05 LAB — COMPREHENSIVE METABOLIC PANEL WITH GFR
ALT: 25 U/L (ref 0–44)
AST: 23 U/L (ref 15–41)
Albumin: 2.6 g/dL — ABNORMAL LOW (ref 3.5–5.0)
Alkaline Phosphatase: 91 U/L (ref 38–126)
Anion gap: 5 (ref 5–15)
BUN: 6 mg/dL (ref 6–20)
CO2: 23 mmol/L (ref 22–32)
Calcium: 7.1 mg/dL — ABNORMAL LOW (ref 8.9–10.3)
Chloride: 109 mmol/L (ref 98–111)
Creatinine, Ser: 1 mg/dL (ref 0.44–1.00)
GFR, Estimated: >60 mL/min (ref >60)
Glucose, Bld: 128 mg/dL — ABNORMAL HIGH (ref 70–99)
Potassium: 2.9 mmol/L — ABNORMAL LOW (ref 3.5–5.1)
Sodium: 137 mmol/L (ref 135–145)
Total Bilirubin: 0.4 mg/dL (ref 0.0–1.2)
Total Protein: 4.9 g/dL — ABNORMAL LOW (ref 6.5–8.1)

## 2023-07-05 LAB — CBC WITH DIFFERENTIAL/PLATELET
Abs Immature Granulocytes: 0.08 10*3/uL — ABNORMAL HIGH (ref 0.00–0.07)
Basophils Absolute: 0.1 10*3/uL (ref 0.0–0.1)
Basophils Relative: 1 %
Eosinophils Absolute: 0.3 10*3/uL (ref 0.0–0.5)
Eosinophils Relative: 2 %
HCT: 41.7 % (ref 36.0–46.0)
Hemoglobin: 13.3 g/dL (ref 12.0–15.0)
Immature Granulocytes: 1 %
Lymphocytes Relative: 20 %
Lymphs Abs: 3.3 10*3/uL (ref 0.7–4.0)
MCH: 28.5 pg (ref 26.0–34.0)
MCHC: 31.9 g/dL (ref 30.0–36.0)
MCV: 89.3 fL (ref 80.0–100.0)
Monocytes Absolute: 1 10*3/uL (ref 0.1–1.0)
Monocytes Relative: 6 %
Neutro Abs: 12 10*3/uL — ABNORMAL HIGH (ref 1.7–7.7)
Neutrophils Relative %: 70 %
Platelets: 366 10*3/uL (ref 150–400)
RBC: 4.67 MIL/uL (ref 3.87–5.11)
RDW: 13.9 % (ref 11.5–15.5)
WBC: 16.7 10*3/uL — ABNORMAL HIGH (ref 4.0–10.5)
nRBC: 0 % (ref 0.0–0.2)

## 2023-07-05 LAB — TROPONIN I (HIGH SENSITIVITY)
Troponin I (High Sensitivity): 3 ng/L (ref <18)
Troponin I (High Sensitivity): 3 ng/L (ref <18)

## 2023-07-05 LAB — HCG, SERUM, QUALITATIVE: Preg, Serum: NEGATIVE

## 2023-07-05 MED ORDER — POTASSIUM CHLORIDE CRYS ER 20 MEQ PO TBCR
40.0000 meq | EXTENDED_RELEASE_TABLET | Freq: Once | ORAL | Status: AC
  Administered 2023-07-05: 40 meq via ORAL
  Filled 2023-07-05: qty 2

## 2023-07-05 MED ORDER — IOHEXOL 350 MG/ML SOLN
75.0000 mL | Freq: Once | INTRAVENOUS | Status: AC | PRN
  Administered 2023-07-05: 75 mL via INTRAVENOUS

## 2023-07-05 NOTE — ED Provider Notes (Signed)
 [place] EMERGENCY DEPARTMENT AT [facility] Provider Note   CSN: [id number] Arrival date & time: 07/05/23  1550     History  Chief Complaint  Patient presents with   Chest Pain   Shortness of Breath    Yvonne [name] is a 38 y.o. female.  38 year old female with past medical history as detailed below presents for evaluation.  She complains of persistent dyspnea with exertion x 2 weeks.  She denies cough.  She denies fever.  She denies chest pain.  She was seen by her PCP earlier today.  She was sent to the ED for evaluation from her PCPs office.  She reports a history of asthma, but she denies any wheezing.  She does not feel that her asthma is responsible for her symptoms.  The history is provided by the patient.       Home Medications Prior to Admission medications   Medication Sig Start Date End Date Taking? Authorizing Provider  albuterol (PROVENTIL HFA) 108 (90 Base) MCG/ACT inhaler Inhale 2 puffs into the lungs every 4 (four) hours as needed for wheezing or shortness of breath. 07/03/22   Sharman Cheek, MD  baclofen (LIORESAL) 10 MG tablet Take 1 tablet (10 mg total) by mouth 2 (two) times daily as needed for muscle spasms. 12/08/22   Raspet, Noberto Retort, PA-C  dicyclomine (BENTYL) 20 MG tablet Take 1 tablet (20 mg total) by mouth 2 (two) times daily for 14 days. 11/10/22 11/24/22  Maxwell Marion, PA-C  furosemide (LASIX) 40 MG tablet Take 1 tablet (40 mg total) by mouth daily. 09/08/22   Wallis Bamberg, PA-C  hydrOXYzine (ATARAX) 50 MG tablet Take 50 mg by mouth 3 (three) times daily. 06/30/21   [provider]  ibuprofen (ADVIL) 800 MG tablet Take 1 tablet (800 mg total) by mouth 3 (three) times daily. 09/30/22   Rising, Lurena Joiner, PA-C  levothyroxine (SYNTHROID) 75 MCG tablet Take 75 mcg by mouth daily before breakfast.    [provider]  lidocaine (XYLOCAINE) 2 % solution Use as directed 15 mLs in the mouth or throat every 3 (three) hours as needed for  mouth pain. Swish/gargle and spit out 09/30/22   Rising, Lurena Joiner, PA-C  metroNIDAZOLE (METROGEL) 0.75 % vaginal gel Place 1 Applicatorful vaginally at bedtime. 12/08/22   Raspet, Noberto Retort, PA-C  potassium chloride (KLOR-CON) 10 MEQ tablet Take 1 tablet (10 mEq total) by mouth daily. 09/08/22   Wallis Bamberg, PA-C  QUEtiapine (SEROQUEL) 400 MG tablet Take 450 mg by mouth at bedtime.    [provider]  SUMAtriptan (IMITREX) 20 MG/ACT nasal spray Place 1 spray (20 mg total) into the nose as needed for migraine or headache. May repeat in 2 hours if headache persists or recurs.  Use nasal spray on the same side as the headache. 07/03/22 07/03/23  Sharman Cheek, MD      Allergies    Aspirin, Bee venom, Metoclopramide, Reglan [metoclopramide hcl], and Metronidazole    Review of Systems   Review of Systems  All other systems reviewed and are negative.   Physical Exam Updated Vital Signs BP 110/69 (BP Location: Left Arm)   Pulse (!) 103   Temp 99.1 F (37.3 C) (Oral)   Resp 16   Ht 5' (1.524 m)   Wt 120.2 kg   LMP 07/25/2012   SpO2 97%   BMI 51.75 kg/m  Physical Exam Vitals and nursing note reviewed.  Constitutional:      General: She is not  in acute distress.    Appearance: Normal appearance. She is well-developed.  HENT:     Head: Normocephalic and atraumatic.  Eyes:     Conjunctiva/sclera: Conjunctivae normal.     Pupils: Pupils are equal, round, and reactive to light.  Cardiovascular:     Rate and Rhythm: Normal rate and regular rhythm.     Heart sounds: Normal heart sounds.  Pulmonary:     Effort: Pulmonary effort is normal. No respiratory distress.     Breath sounds: Normal breath sounds.  Abdominal:     General: There is no distension.     Palpations: Abdomen is soft.     Tenderness: There is no abdominal tenderness.  Musculoskeletal:        General: No deformity. Normal range of motion.     Cervical back: Normal range of motion and neck supple.  Skin:    General:  Skin is warm and dry.  Neurological:     General: No focal deficit present.     Mental Status: She is alert and oriented to person, place, and time.     ED Results / Procedures / Treatments   Labs (all labs ordered are listed, but only abnormal results are displayed) Labs Reviewed  HCG, SERUM, QUALITATIVE  CBC WITH DIFFERENTIAL/PLATELET  COMPREHENSIVE METABOLIC PANEL WITH GFR  TROPONIN I (HIGH SENSITIVITY)    EKG EKG Interpretation Date/Time:  Friday July 05 2023 16:02:04 EDT Ventricular Rate:  101 PR Interval:  142 QRS Duration:  86 QT Interval:  336 QTC Calculation: 436 R Axis:   38  Text Interpretation: Sinus tachycardia Probable left atrial enlargement Borderline repolarization abnormality Confirmed by Kristine Royal [phone]) on 07/05/2023 4:29:02 PM  Radiology No results found.  Procedures Procedures    Medications Ordered in ED Medications - No data to display  ED Course/ Medical Decision Making/ A&P                                 Medical Decision Making Amount and/or Complexity of Data Reviewed Labs: ordered. Radiology: ordered.  Risk Prescription drug management.    Medical Screen Complete  This patient presented to the ED with complaint of dyspnea.  This complaint involves an extensive number of treatment options. The initial differential diagnosis includes, but is not limited to, asthma exacerbation, PE, pneumonia, pneumothorax, metabolic abnormality, etc.  This presentation is: Acute, Chronic, Self-Limited, Previously Undiagnosed, Uncertain Prognosis, Complicated, Systemic Symptoms, and Threat to Life/Bodily Function  Patient is presenting with gradually worsening dyspnea x 2 weeks.  Objectively patient's workup is without significant acute abnormality.  Patient is without hypoxia.  Patient's vitals otherwise are without significant abnormality.  Patient had mild hypokalemia which was treated.  Obtained workup including CTA chest is without  evidence of acute pulmonary pathology.  Patient reassured after evaluation.  Importance of close follow-up stressed.  Strict return precautions given and understood.  Co morbidities that complicated the patient's evaluation  See HPI   Additional history obtained:  External records from outside sources obtained and reviewed including prior ED visits and prior Inpatient records.    Problem List / ED Course:  Dyspnea, Hypokalemia   Reevaluation:  After the interventions noted above, I reevaluated the patient and found that they have: resolved   Disposition:  After consideration of the diagnostic results and the patients response to treatment, I feel that the patent would benefit from close outpatient followup.  Final Clinical Impression(s) / ED Diagnoses Final diagnoses:  Dyspnea, unspecified type    Rx / DC Orders ED Discharge Orders     None         Wynetta Fines, MD 07/05/23 2219

## 2023-07-05 NOTE — ED Triage Notes (Signed)
 Pt complained of cp and shob intermittently for the last two weeks. Chest xray done at MD office earlier today. Patient states it was normal. Denies dizziness or any other complains

## 2023-07-05 NOTE — Discharge Instructions (Signed)
 Return for any problem.  ?

## 2023-07-14 ENCOUNTER — Ambulatory Visit
Admission: RE | Admit: 2023-07-14 | Discharge: 2023-07-14 | Disposition: A | Discharge status: Home / Self Care | Payer: Self-pay | Source: Ambulatory Visit | Attending: Family Medicine | Admitting: Family Medicine

## 2023-07-14 VITALS — BP 144/84 | HR 75 | Temp 98.4°F | Resp 20

## 2023-07-14 DIAGNOSIS — B9689 Other specified bacterial agents as the cause of diseases classified elsewhere: Secondary | ICD-10-CM | POA: Insufficient documentation

## 2023-07-14 DIAGNOSIS — N760 Acute vaginitis: Secondary | ICD-10-CM | POA: Insufficient documentation

## 2023-07-14 MED ORDER — FLUCONAZOLE 150 MG PO TABS: 150.0000 mg | ORAL_TABLET | ORAL | 0 refills | Status: DC

## 2023-07-14 MED ORDER — METRONIDAZOLE 0.75 % VA GEL: 1.0000 | Freq: Every day | VAGINAL | 0 refills | Status: DC

## 2023-07-14 NOTE — ED Provider Notes (Signed)
 [facility]  Note:  This document was prepared using Conservation officer, historic buildings and may include unintentional dictation errors.  MRN: [id number] DOB: 02-08-1986  Subjective:   Yvonne [name] is a 38 y.o. female presenting for 1 day history of acute onset thick cottage cheese vaginal discharge and irritation.  Patient would like coverage for recurrent BV and yeast infection.  States that she has had frequent infections with these.  Prefers MetroGel still metronidazole oral tablets.  No urinary symptoms.  No concern for STI.  Declines testing.  No current facility-administered medications for this encounter.  Current Outpatient Medications:    albuterol (PROVENTIL HFA) 108 (90 Base) MCG/ACT inhaler, Inhale 2 puffs into the lungs every 4 (four) hours as needed for wheezing or shortness of breath., Disp: 1 each, Rfl: 0   baclofen (LIORESAL) 10 MG tablet, Take 1 tablet (10 mg total) by mouth 2 (two) times daily as needed for muscle spasms., Disp: 30 each, Rfl: 0   dicyclomine (BENTYL) 20 MG tablet, Take 1 tablet (20 mg total) by mouth 2 (two) times daily for 14 days., Disp: 28 tablet, Rfl: 0   furosemide (LASIX) 40 MG tablet, Take 1 tablet (40 mg total) by mouth daily., Disp: 5 tablet, Rfl: 0   hydrOXYzine (ATARAX) 50 MG tablet, Take 50 mg by mouth 3 (three) times daily., Disp: , Rfl:    ibuprofen (ADVIL) 800 MG tablet, Take 1 tablet (800 mg total) by mouth 3 (three) times daily., Disp: 21 tablet, Rfl: 0   levothyroxine (SYNTHROID) 75 MCG tablet, Take 75 mcg by mouth daily before breakfast., Disp: , Rfl:    lidocaine (XYLOCAINE) 2 % solution, Use as directed 15 mLs in the mouth or throat every 3 (three) hours as needed for mouth pain. Swish/gargle and spit out, Disp: 100 mL, Rfl: 0   metroNIDAZOLE (METROGEL) 0.75 % vaginal gel, Place 1 Applicatorful vaginally at bedtime., Disp: 70 g, Rfl: 0   potassium chloride (KLOR-CON) 10 MEQ tablet, Take 1 tablet (10 mEq  total) by mouth daily., Disp: 5 tablet, Rfl: 0   QUEtiapine (SEROQUEL) 400 MG tablet, Take 450 mg by mouth at bedtime., Disp: , Rfl:    SUMAtriptan (IMITREX) 20 MG/ACT nasal spray, Place 1 spray (20 mg total) into the nose as needed for migraine or headache. May repeat in 2 hours if headache persists or recurs.  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 - tx w/otc med   Asthma    albuterol inhaler   Blood transfusion    2008 with c/s 4 units transfused   Carpal tunnel syndrome on right    Chronic back pain    Depression    Dyspnea    exertion   Headache(784.0)    otc ibuprofen 800mg    Heart murmur    as a  child   Obesity    Pneumonia    as a child   Thyroid disease      Past Surgical History:  Procedure Laterality Date   ABDOMINAL HYSTERECTOMY N/A 09/29/2012   Procedure: HYSTERECTOMY ABDOMINAL;  Surgeon: Madelene Schanz, MD;  Location: [facility];  Service: Gynecology;  Laterality: N/A;   APPENDECTOMY     BACK SURGERY     BILATERAL SALPINGECTOMY Bilateral 09/29/2012   Procedure: BILATERAL SALPINGECTOMY;  Surgeon: Madelene Schanz, MD;  Location: [facility];  Service: Gynecology;  Laterality: Bilateral;   CESAREAN SECTION     CHOLECYSTECTOMY     CYSTOSCOPY Bilateral 09/29/2012   Procedure: CYSTOSCOPY;  Surgeon: Madelene Schanz, MD;  Location: [facility];  Service: Gynecology;  Laterality: Bilateral;   ENDOMETRIAL  ABLATION     EXAMINATION UNDER ANESTHESIA N/A 10/16/2012   Procedure: EXAM UNDER ANESTHESIA;  Surgeon: Madelene Schanz, MD;  Location: [facility];  Service: Gynecology;  Laterality: N/A;  Suturing Vaginal Cuff   HAND SURGERY Right    carpal tunnel   LAPAROSCOPIC ASSISTED VAGINAL HYSTERECTOMY N/A 09/29/2012   Procedure: LAPAROSCOPIC ASSISTED VAGINAL HYSTERECTOMY;  Surgeon: Madelene Schanz, MD;  Location: [facility];  Service: Gynecology;  Laterality: N/A;   LAPAROSCOPIC HYSTERECTOMY Bilateral 09/29/2012   Procedure: HYSTERECTOMY TOTAL LAPAROSCOPIC;  Surgeon: Madelene Schanz, MD;  Location: [facility];  Service: Gynecology;  Laterality: Bilateral;  Total Laparoscopic Hysterectomy, Bilateral Salpingectomies, Cystoscopy poss. LAVH poss. TAH   LAPAROSCOPY     x3   LUMBAR LAMINECTOMY N/A 11/05/2014   Procedure: RIGHT L4-5 MICRODISCECTOMY ;  Surgeon: Alphonso Jean, MD;  Location: [facility];  Service: Orthopedics;  Laterality: N/A;   LUMBAR LAMINECTOMY/DECOMPRESSION MICRODISCECTOMY N/A 04/10/2016   Procedure: Right L4-5 Microdiscectomy;  Surgeon: Alphonso Jean, MD;  Location: [facility] OR;  Service: Orthopedics;  Laterality: N/A;   THYROIDECTOMY     THYROIDECTOMY     TONSILLECTOMY     T & A   TRANSFORAMINAL LUMBAR INTERBODY FUSION (TLIF) WITH PEDICLE SCREW FIXATION 1 LEVEL N/A 02/13/2017   Procedure: Transforaminal Lumbar Interbody Fusion - Lumbar four-five;  Surgeon: Isadora Mar, MD;  Location[facility];  Service: Neurosurgery;  Laterality: N/A;   TUBAL LIGATION      Family History  Problem Relation Age of Onset   Diabetes Mother    Diabetes Maternal Grandmother    Stroke Father     Social History   Tobacco Use   Smoking status: Former    Current packs/day: 0.00    Types: Cigarettes    Quit date: 10/17/2015    Years since quitting: 7.7   Smokeless tobacco: Never  Vaping Use   Vaping status: Never Used  Substance Use Topics   Alcohol use: Yes    Comment: occ   Drug use: No    ROS   Objective:    Vitals: BP (!) 144/84 (BP Location: Right Arm)   Pulse 75   Temp 98.4 F (36.9 C) (Oral)   Resp 20   LMP 07/25/2012   SpO2 97%   Physical Exam Constitutional:      General: She is not in acute distress.    Appearance: Normal appearance. She is well-developed. She is not ill-appearing, toxic-appearing or diaphoretic.  HENT:     Head: Normocephalic and atraumatic.     Nose: Nose normal.     Mouth/Throat:     Mouth: Mucous membranes are moist.  Eyes:     General: No scleral icterus.  Right eye: No discharge.        Left eye: No discharge.     Extraocular Movements: Extraocular movements intact.     Conjunctiva/sclera: Conjunctivae normal.  Cardiovascular:     Rate and Rhythm: Normal rate.  Pulmonary:     Effort: Pulmonary effort is normal.  Abdominal:     General: Bowel sounds are normal. There is no distension.     Palpations: Abdomen is soft. There is no mass.     Tenderness: There is no abdominal tenderness. There is no right CVA tenderness, left CVA tenderness, guarding or rebound.  Skin:    General: Skin is warm and dry.  Neurological:     General: No focal deficit present.     Mental Status: She is alert and oriented to person, place, and time.  Psychiatric:        Mood and Affect: Mood normal.        Behavior: Behavior normal.        Thought Content: Thought content normal.        Judgment: Judgment normal.     Assessment and Plan :   PDMP not reviewed this encounter.  1. Acute vaginitis   2. Bacterial vaginosis    We will treat patient empirically for bacterial vaginosis with MetroGel and for yeast vaginitis with fluconazole.  Labs pending. Counseled patient on potential for adverse effects with medications prescribed/recommended today, ER and return-to-clinic precautions discussed, patient verbalized understanding.    Adolph Hoop, PA-C 07/14/23 1351

## 2023-07-14 NOTE — ED Triage Notes (Signed)
 Pt c/o vaginal d/c and irritation started last night-NAD-steady gait

## 2023-07-17 LAB — CERVICOVAGINAL ANCILLARY ONLY
Bacterial Vaginitis (gardnerella): POSITIVE — AB
Candida Glabrata: NEGATIVE
Candida Vaginitis: POSITIVE — AB
Comment: NEGATIVE
Comment: NEGATIVE
Comment: NEGATIVE

## 2023-08-31 ENCOUNTER — Ambulatory Visit
Admission: EM | Admit: 2023-08-31 | Discharge: 2023-08-31 | Disposition: A | Discharge status: Other Acute Inpatient Hospital | Attending: Family Medicine | Admitting: Family Medicine

## 2023-08-31 ENCOUNTER — Emergency Department (HOSPITAL_BASED_OUTPATIENT_CLINIC_OR_DEPARTMENT_OTHER): Admission: EM | Admit: 2023-08-31 | Discharge: 2023-08-31 | Disposition: A | Discharge status: Home / Self Care

## 2023-08-31 ENCOUNTER — Other Ambulatory Visit: Payer: Self-pay

## 2023-08-31 ENCOUNTER — Encounter (HOSPITAL_BASED_OUTPATIENT_CLINIC_OR_DEPARTMENT_OTHER): Payer: Self-pay | Admitting: Emergency Medicine

## 2023-08-31 ENCOUNTER — Emergency Department (HOSPITAL_BASED_OUTPATIENT_CLINIC_OR_DEPARTMENT_OTHER): Admitting: Radiology

## 2023-08-31 DIAGNOSIS — N898 Other specified noninflammatory disorders of vagina: Secondary | ICD-10-CM | POA: Insufficient documentation

## 2023-08-31 DIAGNOSIS — R600 Localized edema: Secondary | ICD-10-CM | POA: Diagnosis present

## 2023-08-31 DIAGNOSIS — M7989 Other specified soft tissue disorders: Secondary | ICD-10-CM | POA: Insufficient documentation

## 2023-08-31 DIAGNOSIS — M79604 Pain in right leg: Secondary | ICD-10-CM | POA: Insufficient documentation

## 2023-08-31 DIAGNOSIS — F17210 Nicotine dependence, cigarettes, uncomplicated: Secondary | ICD-10-CM | POA: Insufficient documentation

## 2023-08-31 DIAGNOSIS — I509 Heart failure, unspecified: Secondary | ICD-10-CM | POA: Insufficient documentation

## 2023-08-31 DIAGNOSIS — M79605 Pain in left leg: Secondary | ICD-10-CM | POA: Insufficient documentation

## 2023-08-31 DIAGNOSIS — E119 Type 2 diabetes mellitus without complications: Secondary | ICD-10-CM | POA: Insufficient documentation

## 2023-08-31 LAB — URINALYSIS, ROUTINE W REFLEX MICROSCOPIC
Bacteria, UA: NONE SEEN
Bilirubin Urine: NEGATIVE
Glucose, UA: NEGATIVE mg/dL
Hgb urine dipstick: NEGATIVE
Ketones, ur: NEGATIVE mg/dL
Leukocytes,Ua: NEGATIVE
Nitrite: NEGATIVE
Specific Gravity, Urine: 1.023 (ref 1.005–1.030)
pH: 6.5 (ref 5.0–8.0)

## 2023-08-31 LAB — CBC WITH DIFFERENTIAL/PLATELET
Abs Immature Granulocytes: 0.06 10*3/uL (ref 0.00–0.07)
Basophils Absolute: 0 10*3/uL (ref 0.0–0.1)
Basophils Relative: 0 %
Eosinophils Absolute: 0.4 10*3/uL (ref 0.0–0.5)
Eosinophils Relative: 3 %
HCT: 34.5 % — ABNORMAL LOW (ref 36.0–46.0)
Hemoglobin: 11.5 g/dL — ABNORMAL LOW (ref 12.0–15.0)
Immature Granulocytes: 1 %
Lymphocytes Relative: 28 %
Lymphs Abs: 3.5 10*3/uL (ref 0.7–4.0)
MCH: 28.4 pg (ref 26.0–34.0)
MCHC: 33.3 g/dL (ref 30.0–36.0)
MCV: 85.2 fL (ref 80.0–100.0)
Monocytes Absolute: 0.8 10*3/uL (ref 0.1–1.0)
Monocytes Relative: 7 %
Neutro Abs: 7.6 10*3/uL (ref 1.7–7.7)
Neutrophils Relative %: 61 %
Platelets: 344 10*3/uL (ref 150–400)
RBC: 4.05 MIL/uL (ref 3.87–5.11)
RDW: 13.1 % (ref 11.5–15.5)
WBC: 12.4 10*3/uL — ABNORMAL HIGH (ref 4.0–10.5)
nRBC: 0 % (ref 0.0–0.2)

## 2023-08-31 LAB — COMPREHENSIVE METABOLIC PANEL WITH GFR
ALT: 31 U/L (ref 0–44)
AST: 27 U/L (ref 15–41)
Albumin: 3.6 g/dL (ref 3.5–5.0)
Alkaline Phosphatase: 136 U/L — ABNORMAL HIGH (ref 38–126)
Anion gap: 12 (ref 5–15)
BUN: 8 mg/dL (ref 6–20)
CO2: 23 mmol/L (ref 22–32)
Calcium: 8.9 mg/dL (ref 8.9–10.3)
Chloride: 102 mmol/L (ref 98–111)
Creatinine, Ser: 1.02 mg/dL — ABNORMAL HIGH (ref 0.44–1.00)
GFR, Estimated: >60 mL/min (ref >60)
Glucose, Bld: 216 mg/dL — ABNORMAL HIGH (ref 70–99)
Potassium: 3.5 mmol/L (ref 3.5–5.1)
Sodium: 138 mmol/L (ref 135–145)
Total Bilirubin: <0.2 mg/dL (ref 0.0–1.2)
Total Protein: 5.9 g/dL — ABNORMAL LOW (ref 6.5–8.1)

## 2023-08-31 LAB — PRO BRAIN NATRIURETIC PEPTIDE: Pro Brain Natriuretic Peptide: 59.3 pg/mL (ref <300.0)

## 2023-08-31 LAB — TROPONIN T, HIGH SENSITIVITY: Troponin T High Sensitivity: <15 ng/L (ref <19)

## 2023-08-31 LAB — PREGNANCY, URINE: Preg Test, Ur: NEGATIVE

## 2023-08-31 MED ORDER — METRONIDAZOLE 0.75 % EX GEL: 1.0000 | Freq: Every evening | CUTANEOUS | 0 refills | Status: AC

## 2023-08-31 MED ORDER — METFORMIN HCL 500 MG PO TABS: 500.0000 mg | ORAL_TABLET | Freq: Two times a day (BID) | ORAL | 0 refills | Status: AC

## 2023-08-31 NOTE — Discharge Instructions (Addendum)
 The clinical contact you with results of the vaginal swab done today if positive.  Start MetroGel  vaginal inserts nightly for 7 nights.  Please go to the emergency room for further evaluation of your lower extremity swelling.

## 2023-08-31 NOTE — ED Triage Notes (Signed)
 Bilateral lower extrem. 3+pitting edema for about 2 weeks.  Has happened in the past, and was given short course of water  pill from urgent care, followed up with primary and felt no need to continue

## 2023-08-31 NOTE — ED Provider Notes (Signed)
 [facility]    CSN: [id number] Arrival date & time: 08/31/23  1006      History   Chief Complaint Chief Complaint  Patient presents with   Leg Swelling    Bilateral leg swelling   vaginal odor    HPI Yvonne [name] is a 38 y.o. female presents for leg swelling and vaginal discharge. Patient reports 3 days of a malodorous nonpruritic vaginal discharge.  Denies dysuria, fevers, nausea/vomiting, flank pain.  No STD exposure or concern.  Has a history of reoccurring BV infections and states this is similar.  Had some leftover MetroGel  that she used for symptoms. Patient reports 3 days of persistent bilateral lower extremity swelling.  States symptoms slightly improved with elevation but did not resolve.  She does have pain with walking.  She states she had some shortness of breath the day before onset but no shortness of breath since symptoms began.  Denies chest pain, orthopnea.  Denies history of CHF or CKD.  She does eat out often and add salt to her food.  She has never had any kind of cardiac workup for CHF.  She has had this in the past and states symptoms got a little better with diuretics but did not resolve.  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 - tx w/otc med   Asthma    albuterol  inhaler   Blood transfusion    2008 with c/s 4 units transfused   Carpal tunnel syndrome on right    Chronic back pain    Depression    Dyspnea    exertion   Headache(784.0)    otc ibuprofen  800mg    Heart murmur    as a child   Obesity    Pneumonia    as a child   Thyroid  disease     Patient Active Problem List   Diagnosis Date Noted   S/P lumbar spinal fusion 02/13/2017   Herniated nucleus pulposus, lumbar 04/10/2016   HNP (herniated nucleus pulposus), lumbar 11/05/2014    Class: Chronic   S/P endometrial ablation - HTA 2012 06/03/2012   Encounter for sterilization 06/03/2012   OBESITY  09/03/2007   Migraine headache 09/03/2007    Past Surgical History:  Procedure Laterality Date   ABDOMINAL HYSTERECTOMY N/A 09/29/2012   Procedure: HYSTERECTOMY ABDOMINAL;  Surgeon: Madelene Schanz, MD;  Location: [facility];  Service: Gynecology;  Laterality: N/A;   APPENDECTOMY     BACK SURGERY     BILATERAL SALPINGECTOMY Bilateral 09/29/2012   Procedure: BILATERAL SALPINGECTOMY;  Surgeon: Madelene Schanz, MD;  Location: [facility];  Service: Gynecology;  Laterality: Bilateral;   CESAREAN SECTION     CHOLECYSTECTOMY     CYSTOSCOPY Bilateral 09/29/2012   Procedure: CYSTOSCOPY;  Surgeon: Madelene Schanz, MD;  Location: [facility];  Service: Gynecology;  Laterality: Bilateral;   ENDOMETRIAL ABLATION     EXAMINATION UNDER ANESTHESIA N/A 10/16/2012   Procedure: EXAM UNDER ANESTHESIA;  Surgeon: Madelene Schanz, MD;  Location: [facility];  Service: Gynecology;  Laterality: N/A;  Suturing Vaginal Cuff   HAND SURGERY Right    carpal tunnel   LAPAROSCOPIC ASSISTED VAGINAL HYSTERECTOMY N/A 09/29/2012   Procedure: LAPAROSCOPIC ASSISTED VAGINAL HYSTERECTOMY;  Surgeon: Madelene Schanz, MD;  Location: [facility];  Service: Gynecology;  Laterality: N/A;   LAPAROSCOPIC HYSTERECTOMY Bilateral 09/29/2012   Procedure: HYSTERECTOMY TOTAL  LAPAROSCOPIC;  Surgeon: Madelene Schanz, MD;  Location: [facility];  Service: Gynecology;  Laterality: Bilateral;  Total Laparoscopic Hysterectomy, Bilateral Salpingectomies, Cystoscopy poss. LAVH poss. TAH   LAPAROSCOPY     x3   LUMBAR LAMINECTOMY N/A 11/05/2014   Procedure: RIGHT L4-5 MICRODISCECTOMY ;  Surgeon: Alphonso Jean, MD;  Location: [facility];  Service: Orthopedics;  Laterality: N/A;   LUMBAR LAMINECTOMY/DECOMPRESSION MICRODISCECTOMY N/A 04/10/2016   Procedure: Right L4-5 Microdiscectomy;  Surgeon: Alphonso Jean, MD;  Location: [facility];  Service: Orthopedics;  Laterality: N/A;   THYROIDECTOMY     THYROIDECTOMY     TONSILLECTOMY     T & A   TRANSFORAMINAL LUMBAR INTERBODY FUSION (TLIF) WITH  PEDICLE SCREW FIXATION 1 LEVEL N/A 02/13/2017   Procedure: Transforaminal Lumbar Interbody Fusion - Lumbar four-five;  Surgeon: Isadora Mar, MD;  Location: [facility];  Service: Neurosurgery;  Laterality: N/A;   TUBAL LIGATION      OB History     Gravida  4   Para  4   Term  4   Preterm      AB      Living  1      SAB      IAB      Ectopic      Multiple      Live Births  1            Home Medications    Prior to Admission medications   Medication Sig Start Date End Date Taking? Authorizing Provider  albuterol  (PROVENTIL  HFA) 108 (90 Base) MCG/ACT inhaler Inhale 2 puffs into the lungs every 4 (four) hours as needed for wheezing or shortness of breath. 07/03/22  Yes Jacquie Maudlin, MD  baclofen  (LIORESAL ) 10 MG tablet Take 1 tablet (10 mg total) by mouth 2 (two) times daily as needed for muscle spasms. 12/08/22  Yes Raspet, Erin K, PA-C  furosemide  (LASIX ) 40 MG tablet Take 1 tablet (40 mg total) by mouth daily. 09/08/22  Yes Adolph Hoop, PA-C  hydrOXYzine (ATARAX) 50 MG tablet Take 50 mg by mouth 3 (three) times daily. 06/30/21  Yes [provider]  ibuprofen  (ADVIL ) 800 MG tablet Take 1 tablet (800 mg total) by mouth 3 (three) times daily. 09/30/22  Yes Rising, Ivette Marks, PA-C  levothyroxine (SYNTHROID) 75 MCG tablet Take 75 mcg by mouth daily before breakfast.   Yes [provider]  lidocaine  (XYLOCAINE ) 2 % solution Use as directed 15 mLs in the mouth or throat every 3 (three) hours as needed for mouth pain. Swish/gargle and spit out 09/30/22  Yes Rising, Ivette Marks, PA-C  metroNIDAZOLE  (METROGEL ) 0.75 % gel Apply 1 Application topically at bedtime for 7 days. 08/31/23 09/07/23 Yes Chatham Howington, Jodi R, NP  potassium chloride  (KLOR-CON ) 10 MEQ tablet Take 1 tablet (10 mEq total) by mouth daily. 09/08/22  Yes Adolph Hoop, PA-C  QUEtiapine  (SEROQUEL ) 400 MG tablet Take 450 mg by mouth at bedtime.   Yes [provider]  dicyclomine  (BENTYL ) 20 MG tablet Take 1  tablet (20 mg total) by mouth 2 (two) times daily for 14 days. 11/10/22 11/24/22  Sonnie Dusky, PA-C  fluconazole  (DIFLUCAN ) 150 MG tablet Take 1 tablet (150 mg total) by mouth every 3 (three) days. 07/14/23   Adolph Hoop, PA-C  metroNIDAZOLE  (METROGEL ) 0.75 % vaginal gel Place 1 Applicatorful vaginally daily. Insert one applicator vaginally once daily. 07/14/23   Adolph Hoop, PA-C  SUMAtriptan  (IMITREX ) 20 MG/ACT nasal spray Place 1 spray (20 mg  total) into the nose as needed for migraine or headache. May repeat in 2 hours if headache persists or recurs.  Use nasal spray on the same side as the headache. 07/03/22 07/03/23  Jacquie Maudlin, MD    Family History Family History  Problem Relation Age of Onset   Diabetes Mother    Diabetes Maternal Grandmother    Stroke Father     Social History Social History   Tobacco Use   Smoking status: Former    Current packs/day: 0.00    Types: Cigarettes    Quit date: 10/17/2015    Years since quitting: 7.8   Smokeless tobacco: Never  Vaping Use   Vaping status: Never Used  Substance Use Topics   Alcohol use: Yes    Comment: occ   Drug use: No     Allergies   Aspirin, Bee venom, Metoclopramide, Reglan [metoclopramide hcl], and Metronidazole    Review of Systems Review of Systems  Cardiovascular:  Positive for leg swelling.  Genitourinary:  Positive for vaginal discharge.     Physical Exam Triage Vital Signs ED Triage Vitals  Encounter Vitals Group     BP 08/31/23 1027 121/82     Systolic BP Percentile --      Diastolic BP Percentile --      Pulse Rate 08/31/23 1027 80     Resp 08/31/23 1027 17     Temp 08/31/23 1027 98 F (36.7 C)     Temp Source 08/31/23 1027 Oral     SpO2 08/31/23 1027 97 %     Weight 08/31/23 1025 265 lb (120.2 kg)     Height 08/31/23 1025 5' (1.524 m)     Head Circumference --      Peak Flow --      Pain Score 08/31/23 1025 0     Pain Loc --      Pain Education --      Exclude from Growth Chart --     No data found.  Updated Vital Signs BP 121/82 (BP Location: Left Arm)   Pulse 80   Temp 98 F (36.7 C) (Oral)   Resp 17   Ht 5' (1.524 m)   Wt 265 lb (120.2 kg)   LMP 07/25/2012   SpO2 97%   BMI 51.75 kg/m   Visual Acuity Right Eye Distance:   Left Eye Distance:   Bilateral Distance:    Right Eye Near:   Left Eye Near:    Bilateral Near:     Physical Exam Vitals and nursing note reviewed.  Constitutional:      General: She is not in acute distress.    Appearance: Normal appearance. She is obese. She is not ill-appearing, toxic-appearing or diaphoretic.  HENT:     Head: Normocephalic and atraumatic.  Eyes:     Pupils: Pupils are equal, round, and reactive to light.  Cardiovascular:     Rate and Rhythm: Normal rate and regular rhythm.     Heart sounds: Normal heart sounds.  Pulmonary:     Effort: Pulmonary effort is normal.     Breath sounds: Normal breath sounds.  Musculoskeletal:     Right lower leg: 1+ Edema present.     Left lower leg: 1+ Edema present.     Comments: +1-2 pitting edema from ankles that extends to knee bilaterally.  Skin:    General: Skin is warm and dry.  Neurological:     General: No focal deficit present.     Mental  Status: She is alert and oriented to person, place, and time.  Psychiatric:        Mood and Affect: Mood normal.        Behavior: Behavior normal.      UC Treatments / Results  Labs (all labs ordered are listed, but only abnormal results are displayed) Labs Reviewed  CERVICOVAGINAL ANCILLARY ONLY    Comprehensive Metabolic Panel Order: [id number] Component Ref Range & Units 2 mo ago  Sodium 136 - 145 mmol/L 141  Potassium 3.5 - 5.1 mmol/L 3.6  Comment: NO VISIBLE HEMOLYSIS  Chloride 98 - 107 mmol/L 103  CO2 21 - 31 mmol/L 30  Anion Gap 6 - 14 mmol/L 8  Glucose, Random 70 - 99 mg/dL 96  Blood Urea Nitrogen (BUN) 7 - 25 mg/dL 5 Low   Creatinine 4.09 - 1.20 mg/dL 8.11  eGFR >91 YN/WGN/5.62Z3 70   Comment: GFR estimated by CKD-EPI equations(NKF 2021).  "Recommend confirmation of Cr-based eGFR by using Cys-based eGFR and other filtration markers (if applicable) in complex cases and clinical decision-making, as needed."  Albumin 3.5 - 5.7 g/dL 3.6  Total Protein 6.4 - 8.9 g/dL 5.7 Low   Bilirubin, Total 0.3 - 1.0 mg/dL 0.2 Low   Alkaline Phosphatase (ALP) 34 - 104 U/L 100  Aspartate Aminotransferase (AST) 13 - 39 U/L 19  Alanine Aminotransferase (ALT) 7 - 52 U/L 26  Calcium 8.6 - 10.3 mg/dL 8.6  BUN/Creatinine Ratio   Comment: Creatinine is normal, ratio is not clinically indicated.  Resulting Agency [facility](CLIA# [id number])   Specimen Collected: 06/07/23 12:01   Performed by: Rosalva Comber [facility](CLIA# [id number]) Last Resulted: 06/07/23 19:38  Received From: [facility]  Result Received: 07/05/23 15:50    EKG   Radiology No results found.  Procedures Procedures (including critical care time)  Medications Ordered in UC Medications - No data to display  Initial Impression / Assessment and Plan / UC Course  I have reviewed the triage vital signs and the nursing notes.  Pertinent labs & imaging results that were available during my care of the patient were reviewed by me and considered in my medical decision making (see chart for details).     Reviewed exam and symptoms with patient.  I did discuss limitations and abilities of urgent care.  Vaginal swab was ordered and will contact for any positive results.  Will treat with MetroGel  as patient reports symptoms are consistent with previous BV infections.  Discussed lower extremity swelling.  This has been reoccurring and has not resolved in the past with diuretics.  Discussed multiple causes including kidney concerns as well as CHF.  I did advise that she go to the emergency room for further workup of her swelling.  She is in agreement with plan will go POV to the  ER. Final Clinical Impressions(s) / UC Diagnoses   Final diagnoses:  Vaginal discharge  Pain and swelling of left lower extremity  Pain and swelling of right lower extremity     Discharge Instructions      The clinical contact you with results of the vaginal swab done today if positive.  Start MetroGel  vaginal inserts nightly for 7 nights.  Please go to the emergency room for further evaluation of your lower extremity swelling.   ED Prescriptions     Medication Sig Dispense Auth. Provider   metroNIDAZOLE  (METROGEL ) 0.75 % gel Apply 1 Application topically at bedtime for 7 days. 45 g  Alleen Arbour, NP      PDMP not reviewed this encounter.   Alleen Arbour, NP 08/31/23 [phone]

## 2023-08-31 NOTE — ED Notes (Signed)
 Patient is being discharged from the Urgent Care and sent to the Emergency Department via POV . Per Ramonita Burow, NP, patient is in need of higher level of care due to needing higher level of care. Patient is aware and verbalizes understanding of plan of care.  Vitals:   08/31/23 1027  BP: 121/82  Pulse: 80  Resp: 17  Temp: 98 F (36.7 C)  SpO2: 97%

## 2023-08-31 NOTE — ED Provider Notes (Signed)
 [place] EMERGENCY DEPARTMENT AT [facility] Provider Note   CSN: [id number] Arrival date & time: 08/31/23  1131     History  No chief complaint on file.   Yvonne [name] is a 38 y.o. female.  Patient to ED on the advice of Urgent Care. She was seen there for evaluation of peripheral edema that is recurrent, mild chest tightness and DOE. No orthopnea. She states she has been on a diuretic in the past but this was not continued. No fever, cough, congestion, vomiting, abdominal pain.   The history is provided by the patient. No language interpreter was used.       Home Medications Prior to Admission medications   Medication Sig Start Date End Date Taking? Authorizing Provider  metFORMIN (GLUCOPHAGE) 500 MG tablet Take 1 tablet (500 mg total) by mouth 2 (two) times daily with a meal. 08/31/23  Yes Rayen Dafoe, PA-C  albuterol  (PROVENTIL  HFA) 108 (90 Base) MCG/ACT inhaler Inhale 2 puffs into the lungs every 4 (four) hours as needed for wheezing or shortness of breath. 07/03/22   Jacquie Maudlin, MD  baclofen  (LIORESAL ) 10 MG tablet Take 1 tablet (10 mg total) by mouth 2 (two) times daily as needed for muscle spasms. 12/08/22   Raspet, Betsey Brow, PA-C  dicyclomine  (BENTYL ) 20 MG tablet Take 1 tablet (20 mg total) by mouth 2 (two) times daily for 14 days. 11/10/22 11/24/22  Sonnie Dusky, PA-C  fluconazole  (DIFLUCAN ) 150 MG tablet Take 1 tablet (150 mg total) by mouth every 3 (three) days. 07/14/23   Adolph Hoop, PA-C  furosemide  (LASIX ) 40 MG tablet Take 1 tablet (40 mg total) by mouth daily. 09/08/22   Adolph Hoop, PA-C  hydrOXYzine (ATARAX) 50 MG tablet Take 50 mg by mouth 3 (three) times daily. 06/30/21   [provider]  ibuprofen  (ADVIL ) 800 MG tablet Take 1 tablet (800 mg total) by mouth 3 (three) times daily. 09/30/22   Rising, Ivette Marks, PA-C  levothyroxine (SYNTHROID) 75 MCG tablet Take 75 mcg by mouth daily before breakfast.    [provider]  lidocaine   (XYLOCAINE ) 2 % solution Use as directed 15 mLs in the mouth or throat every 3 (three) hours as needed for mouth pain. Swish/gargle and spit out 09/30/22   Rising, Ivette Marks, PA-C  metroNIDAZOLE  (METROGEL ) 0.75 % gel Apply 1 Application topically at bedtime for 7 days. 08/31/23 09/07/23  Mayer, Jodi R, NP  metroNIDAZOLE  (METROGEL ) 0.75 % vaginal gel Place 1 Applicatorful vaginally daily. Insert one applicator vaginally once daily. 07/14/23   Adolph Hoop, PA-C  potassium chloride  (KLOR-CON ) 10 MEQ tablet Take 1 tablet (10 mEq total) by mouth daily. 09/08/22   Adolph Hoop, PA-C  QUEtiapine  (SEROQUEL ) 400 MG tablet Take 450 mg by mouth at bedtime.    [provider]  SUMAtriptan  (IMITREX ) 20 MG/ACT nasal spray Place 1 spray (20 mg total) into the nose as needed for migraine or headache. May repeat in 2 hours if headache persists or recurs.  Use nasal spray on the same side as the headache. 07/03/22 07/03/23  Jacquie Maudlin, MD      Allergies    Aspirin, Bee venom, Metoclopramide, Reglan [metoclopramide hcl], and Metronidazole     Review of Systems   Review of Systems  Physical Exam Updated Vital Signs BP 119/76   Pulse 84   Temp 98.6 F (37 C) (Oral)   Resp (!) 24   LMP 07/25/2012   SpO2 100%  Physical Exam Vitals and nursing note reviewed.  Constitutional:      General: She is not in acute distress.    Appearance: She is well-developed. She is obese. She is not ill-appearing.  HENT:     Head: Normocephalic.  Cardiovascular:     Rate and Rhythm: Normal rate and regular rhythm.     Heart sounds: No murmur heard. Pulmonary:     Effort: Pulmonary effort is normal.     Breath sounds: Normal breath sounds. No wheezing, rhonchi or rales.  Abdominal:     General: Bowel sounds are normal.     Palpations: Abdomen is soft.     Tenderness: There is no abdominal tenderness. There is no guarding or rebound.  Musculoskeletal:        General: Normal range of motion.     Cervical back: Normal  range of motion and neck supple.     Right lower leg: Edema present.     Left lower leg: Edema present.  Skin:    General: Skin is warm and dry.  Neurological:     General: No focal deficit present.     Mental Status: She is alert and oriented to person, place, and time.     ED Results / Procedures / Treatments   Labs (all labs ordered are listed, but only abnormal results are displayed) Labs Reviewed  CBC WITH DIFFERENTIAL/PLATELET - Abnormal; Notable for the following components:      Result Value   WBC 12.4 (*)    Hemoglobin 11.5 (*)    HCT 34.5 (*)    All other components within normal limits  COMPREHENSIVE METABOLIC PANEL WITH GFR - Abnormal; Notable for the following components:   Glucose, Bld 216 (*)    Creatinine, Ser 1.02 (*)    Total Protein 5.9 (*)    Alkaline Phosphatase 136 (*)    All other components within normal limits  URINALYSIS, ROUTINE W REFLEX MICROSCOPIC - Abnormal; Notable for the following components:   Protein, ur TRACE (*)    All other components within normal limits  PRO BRAIN NATRIURETIC PEPTIDE  PREGNANCY, URINE  TROPONIN T, HIGH SENSITIVITY   Results for orders placed or performed during the hospital encounter of 08/31/23  CBC with Differential   Collection Time: 08/31/23 12:18 PM  Result Value Ref Range   WBC 12.4 (H) 4.0 - 10.5 K/uL   RBC 4.05 3.87 - 5.11 MIL/uL   Hemoglobin 11.5 (L) 12.0 - 15.0 g/dL   HCT 04.5 (L) 40.9 - 81.1 %   MCV 85.2 80.0 - 100.0 fL   MCH 28.4 26.0 - 34.0 pg   MCHC 33.3 30.0 - 36.0 g/dL   RDW 91.4 78.2 - 95.6 %   Platelets 344 150 - 400 K/uL   nRBC 0.0 0.0 - 0.2 %   Neutrophils Relative % 61 %   Neutro Abs 7.6 1.7 - 7.7 K/uL   Lymphocytes Relative 28 %   Lymphs Abs 3.5 0.7 - 4.0 K/uL   Monocytes Relative 7 %   Monocytes Absolute 0.8 0.1 - 1.0 K/uL   Eosinophils Relative 3 %   Eosinophils Absolute 0.4 0.0 - 0.5 K/uL   Basophils Relative 0 %   Basophils Absolute 0.0 0.0 - 0.1 K/uL   Immature Granulocytes 1  %   Abs Immature Granulocytes 0.06 0.00 - 0.07 K/uL  Pro Brain natriuretic peptide   Collection Time: 08/31/23 12:18 PM  Result Value Ref Range   Pro Brain Natriuretic Peptide 59.3 <300.0 pg/mL  Comprehensive metabolic panel   Collection  Time: 08/31/23 12:18 PM  Result Value Ref Range   Sodium 138 135 - 145 mmol/L   Potassium 3.5 3.5 - 5.1 mmol/L   Chloride 102 98 - 111 mmol/L   CO2 23 22 - 32 mmol/L   Glucose, Bld 216 (H) 70 - 99 mg/dL   BUN 8 6 - 20 mg/dL   Creatinine, Ser 1.61 (H) 0.44 - 1.00 mg/dL   Calcium 8.9 8.9 - 09.6 mg/dL   Total Protein 5.9 (L) 6.5 - 8.1 g/dL   Albumin 3.6 3.5 - 5.0 g/dL   AST 27 15 - 41 U/L   ALT 31 0 - 44 U/L   Alkaline Phosphatase 136 (H) 38 - 126 U/L   Total Bilirubin <0.2 0.0 - 1.2 mg/dL   GFR, Estimated >04 >54 mL/min   Anion gap 12 5 - 15  Urinalysis, Routine w reflex microscopic -Urine, Clean Catch   Collection Time: 08/31/23 12:18 PM  Result Value Ref Range   Color, Urine YELLOW YELLOW   APPearance CLEAR CLEAR   Specific Gravity, Urine 1.023 1.005 - 1.030   pH 6.5 5.0 - 8.0   Glucose, UA NEGATIVE NEGATIVE mg/dL   Hgb urine dipstick NEGATIVE NEGATIVE   Bilirubin Urine NEGATIVE NEGATIVE   Ketones, ur NEGATIVE NEGATIVE mg/dL   Protein, ur TRACE (A) NEGATIVE mg/dL   Nitrite NEGATIVE NEGATIVE   Leukocytes,Ua NEGATIVE NEGATIVE   RBC / HPF 0-5 0 - 5 RBC/hpf   WBC, UA 0-5 0 - 5 WBC/hpf   Bacteria, UA NONE SEEN NONE SEEN   Squamous Epithelial / HPF 6-10 0 - 5 /HPF   Mucus PRESENT   Pregnancy, urine   Collection Time: 08/31/23 12:18 PM  Result Value Ref Range   Preg Test, Ur NEGATIVE NEGATIVE  Troponin T, High Sensitivity   Collection Time: 08/31/23 12:18 PM  Result Value Ref Range   Troponin T High Sensitivity <15 <19 ng/L    EKG EKG Interpretation Date/Time:  Saturday Aug 31 2023 12:05:41 EDT Ventricular Rate:  83 PR Interval:  157 QRS Duration:  94 QT Interval:  372 QTC Calculation: 438 R Axis:   45  Text  Interpretation: Sinus rhythm Low voltage, precordial leads Borderline T abnormalities, diffuse leads Confirmed by Abner Hoffman [phone]) on 08/31/2023 12:32:18 PM  Radiology DG Chest 2 View Result Date: 08/31/2023 CLINICAL DATA:  Shortness of breath EXAM: CHEST - 2 VIEW COMPARISON:  None Available. FINDINGS: The heart size and mediastinal contours are within normal limits. Both lungs are clear. The visualized skeletal structures are unremarkable. IMPRESSION: No active cardiopulmonary disease. Electronically Signed   By: Reagan Camera M.D.   On: 08/31/2023 12:41    Procedures Procedures    Medications Ordered in ED Medications - No data to display  ED Course/ Medical Decision Making/ A&P                                 Medical Decision Making This patient presents to the ED for concern of edema, DOE, this involves an extensive number of treatment options, and is a complaint that carries with it a high risk of complications and morbidity.  The differential diagnosis includes PNA, CHF, PE PNA, ACS   Co morbidities that complicate the patient evaluation  Morbid obesity   Additional history obtained:  Additional history and/or information obtained from chart review, notable for Urgent Care visit from today addressed her concern for vaginal discharge. She is  sent to ED for further work up of peripheral edema.   Lab Tests:  I Ordered, and personally interpreted labs.  The pertinent results include:   BNP negative Troponin negative (x1) Pregnancy negative Cmet - Glucose 216, Cr. 1.02, Alk Phos 133 CBC - WBC 12.4, hgb 11.5, plts 344     Imaging Studies ordered:  I ordered imaging studies including CXR I independently visualized and interpreted imaging which showed NAD I agree with the radiologist interpretation   Cardiac Monitoring:  The patient was maintained on a cardiac monitor.  I personally viewed and interpreted the cardiac monitored which showed an underlying rhythm of:   EKG Interpretation Date/Time:  Saturday Aug 31 2023 12:05:41 EDT Ventricular Rate:  83 PR Interval:  157 QRS Duration:  94 QT Interval:  372 QTC Calculation: 438 R Axis:   45  Text Interpretation: Sinus rhythm Low voltage, precordial leads Borderline T abnormalities, diffuse leads Confirmed by Abner Hoffman [phone]) on 08/31/2023 12:32:18 PM   Medicines ordered and prescription drug management:  I ordered medication including n/a  for n/a Reevaluation of the patient after these medicines showed that the patient stayed the same I have reviewed the patients home medicines and have made adjustments as needed   Test Considered:  N/a   Critical Interventions:  N/a   Consultations Obtained:  I requested consultation with the n/a,  and discussed lab and imaging findings as well as pertinent plan - they recommend: n/a   Problem List / ED Course:  Presents with LE edema, DOE, chest tightness unlike her asthma Has had a diuretic in the past w/o diagnosis of CHF She is well appearing and in NAD Labs c/w T2DM - will start Metformin; obesity - discussed weight loss; peripheral edema - discussed weight loss and decreased salt intake.  She will follow up with her PCP to continue care.     Reevaluation:  After the interventions noted above, I reevaluated the patient and found that they have :stayed the same   Social Determinants of Health:  +smoker   Disposition:  After consideration of the diagnostic results and the patients response to treatment, I feel that the patient would benefit from discharge home.   Amount and/or Complexity of Data Reviewed Labs: ordered. Radiology: ordered.           Final Clinical Impression(s) / ED Diagnoses Final diagnoses:  Type 2 diabetes mellitus without complication, without long-term current use of insulin (HCC)    Rx / DC Orders ED Discharge Orders          Ordered    metFORMIN (GLUCOPHAGE) 500 MG tablet  2 times daily  with meals        08/31/23 1427              Mandy Second, PA-C 08/31/23 1429    Carin Charleston, MD 09/01/23 [phone]

## 2023-08-31 NOTE — Discharge Instructions (Addendum)
 As we discussed, please start your prescribed medication for a diagnosis of diabetes. Reduce your salt intake and this should help with the leg swelling.   See your doctor for recheck in one week.

## 2023-08-31 NOTE — ED Triage Notes (Signed)
 Pt states that she has some bilateral leg swelling. X3 days  Pt states that she also has vaginal odor. X3 days

## 2023-09-02 LAB — CERVICOVAGINAL ANCILLARY ONLY
Bacterial Vaginitis (gardnerella): NEGATIVE
Candida Glabrata: NEGATIVE
Candida Vaginitis: NEGATIVE
Comment: NEGATIVE
Comment: NEGATIVE
Comment: NEGATIVE

## 2023-09-05 NOTE — Progress Notes (Signed)
 Yvonne [name] presents for  Chief Complaint  Patient presents with  . Follow-up    SUBJECTIVE  Yvonne [name] presents today for follow up of Thyroid  levels.  Thyroid  TSH reviewed. Current thyroid  medication: levothyroxine (Synthroid) 175 mcg.  Feeling well w/o complaints. Denies excessive fatigue, weight changes, heat/cold intolerance, bowel/skin changes or CVS symptoms. Lab Results  Component Value Date   TSH 0.271 (L) 09/03/2023     She was recently seen in the ER for BLE swelling and chest pain. EKG showed possible T wave Abnormalities. She has had consistent bouts of CP for a while. No current CP. She was started on Metformin  for previous A1C of 6.4     Allergies  Allergen Reactions  . Aspirin Anaphylaxis  . Metoclopramide Anaphylaxis and Swelling  . Venom-Honey Bee Anaphylaxis, Other (See Comments) and Swelling    Fainting, Swelling of whole body and throat   . Acetaminophen      Abd cramps  . Metronidazole  GI Intolerance     Current Outpatient Medications:  .  albuterol  HFA (PROVENTIL  HFA;VENTOLIN  HFA;PROAIR  HFA) 90 mcg/actuation inhaler, Inhale 2 puffs every 4 (four) hours as needed., Disp: , Rfl:  .  atenoloL (TENORMIN) 25 mg tablet, Take 1 tablet (25 mg total) by mouth daily., Disp: 90 tablet, Rfl: 3 .  gemfibroziL (LOPID) 600 mg tablet, Take 1 tablet (600 mg total) by mouth in the morning and 1 tablet (600 mg total) in the evening. Take before meals., Disp: 180 tablet, Rfl: 1 .  hydrOXYzine (ATARAX) 50 mg tablet, Take 1 tablet (50 mg total) by mouth nightly as needed (sleep)., Disp: 30 tablet, Rfl: 1 .  ibuprofen  (MOTRIN ) 800 mg tablet, Take 800 mg by mouth every 6 (six) hours as needed., Disp: 30 tablet, Rfl: 5 .  linaCLOtide (LINZESS) 145 mcg cap capsule, Take one capsule on an empty stomach 30 minutes prior to first meal of the day. Keep medicine in its original container., Disp: 90 capsule, Rfl: 3 .  omeprazole magnesium  (PriLOSEC) 20 mg EC tablet, Take 1 tablet  (20 mg total) by mouth daily., Disp: 90 tablet, Rfl: 3 .  ondansetron  (ZOFRAN ) 4 mg tablet, Take 1 tablet (4 mg total) by mouth every 8 (eight) hours as needed for nausea or vomiting., Disp: 30 tablet, Rfl: 0 .  polyethylene glycol (Golytely ) 236-22.74-6.74 -5.86 gram solr oral solution, Mix and drink 2000 mls on day 1 of prep per instructions, refrigerate the remaining 2000 mls and drink on day 2 per colonoscopy instructions., Disp: 4000 mL, Rfl: 0 .  rizatriptan (MAXALT) 10 mg tablet, 1 tablet at headache onset, repeat in 2 hours if needed. Max 2 pills in 24 hours., Disp: 12 tablet, Rfl: 11 .  busPIRone (BUSPAR) 15 mg tablet, Take 1 tablet (15 mg total) by mouth 3 (three) times a day., Disp: , Rfl:  .  ergocalciferol  (VITAMIN D2) 1,250 mcg (50,000 unit) capsule, Take 1 capsule (50,000 Units total) by mouth once a week., Disp: 12 capsule, Rfl: 3 .  levothyroxine (SYNTHROID) 150 mcg tablet, Take 1 tablet (150 mcg total) by mouth in the morning., Disp: 30 tablet, Rfl: 1 .  metFORMIN  (GLUCOPHAGE ) 500 mg tablet, Take 1 tablet (500 mg total) by mouth in the morning and 1 tablet (500 mg total) in the evening. Take with meals., Disp: 60 tablet, Rfl: 3 .  QUEtiapine  (SeroqueL ) 200 mg tablet, Take 1 tablet (200 mg total) by mouth at bedtime. Take with 400mg  tab to equal 600mg , Disp: , Rfl:  .  QUEtiapine  (SEROquel ) 400 mg tablet, Take 1 tablet (400 mg total) by mouth nightly. Take with 200mg  tab to equal 600mg , Disp: , Rfl:   The following portions of the patient's history were reviewed and updated as appropriate: allergies, current medications, past medical history, past social history, past family history, and problem list.   ROS   See HPI.  OBJECTIVE  BP 118/60   Pulse 83   Temp 98.6 F (37 C) (Oral)   Resp 20   Ht 1.524 m (5')   Wt 121 kg (267 lb)   SpO2 98%   BMI 52.14 kg/m   GENERAL APPEARANCE: Well appearing, well developed, no acute distress. LUNGS: Clear to auscultation  bilaterally HEART: Regular rate and rhythm without murmur. ABDOMEN: Normal bowel sounds, soft, non-tender, without hepato-splenomegaly EXTREMITIES: No edema NEUROLOGIC: Alert and oriented x3, no obvious deficits.    ASSESSMENT/PLAN  1. Postoperative hypothyroidism - levothyroxine (SYNTHROID) 150 mcg tablet; Take 1 tablet (150 mcg total) by mouth in the morning.  Dispense: 30 tablet; Refill: 1 - TSH With Reflex To Free T4; Future\ Dose decreased. States she is feeling well.   2. Vitamin D  deficiency - ergocalciferol  (VITAMIN D2) 1,250 mcg (50,000 unit) capsule; Take 1 capsule (50,000 Units total) by mouth once a week.  Dispense: 12 capsule; Refill: 3 States she ran out of refills and need them sent to a different pharmacy  3. Primary insomnia (Primary) - QUEtiapine  (SEROquel ) 400 mg tablet; Take 1 tablet (400 mg total) by mouth nightly. Take with 200mg  tab to equal 600mg  - QUEtiapine  (SeroqueL ) 200 mg tablet; Take 1 tablet (200 mg total) by mouth at bedtime. Take with 400mg  tab to equal 600mg  Managed by [facility]  4. Prediabetes - metFORMIN  (GLUCOPHAGE ) 500 mg tablet; Take 1 tablet (500 mg total) by mouth in the morning and 1 tablet (500 mg total) in the evening. Take with meals.  Dispense: 60 tablet; Refill: 3 Started at hospital. Will continue to treat   5. GAD (generalized anxiety disorder) - busPIRone (BUSPAR) 15 mg tablet; Take 1 tablet (15 mg total) by mouth 3 (three) times a day. Managed by [facility]  6. Major depressive disorder, recurrent, moderate (HCC) - busPIRone (BUSPAR) 15 mg tablet; Take 1 tablet (15 mg total) by mouth 3 (three) times a day.  7. Electrocardiogram showing T wave abnormalities - Ambulatory referral to Cardiology; Future Per ER notes. Was seen in ED for chest pain   8. Chest pain, unspecified type - Ambulatory referral to Cardiology; Future No issues today.   9.  LVH (left ventricular hypertrophy) - Ambulatory referral to Cardiology; Future  10. Medication management Medications reconciled and matched with pill bottles     Return in about 2 months (around 11/05/2023) for Recheck TSH Labs prior.  This document serves as a record of services personally performed by Sheppard Ee, FNP.  It was created on their behalf by Ileana Riggs, CMA, a trained medical scribe, and Certified Medical Assistant (CMA). During the course of documenting the history, physical exam and medical decision making, I was functioning as a Stage manager. The creation of this record is the provider's dictation and/or activities during the visit.  Electronically signed by Ileana Riggs, CMA 09/05/2023 11:26 AM     I agree the documentation is accurate and complete.  Electronically signed by: Vergil Marko Ee, FNP 09/05/2023 11:27 AM

## 2023-10-11 NOTE — Progress Notes (Signed)
 [facility] [address] [place] [address] Dept: [phone]  Patient Name: Yvonne [name] Primary Care Provider: Le'Starr Marko Ee, FNP  Date: Fri 10/11/2023 Visit Type New Patient Visit Chief Complaint: Advice Only (/)  Problem List: Chest Pain Syndrome Dysphagia Anxiety/Depression Morbid Obesity (BMI > 50) DM2 Hypothyroidism  Subjective:   # New Patient Visit -- Patient presents today after PCP referred for chest pain syndrome.  -- Patient has been endorsing to sharp stabbing chest pains located under left breast and left shoulder joint area. It worsens when she lies on her left side, improves when lying on her right side. It will occur both at rest and with exertion, especially when she is picking up something heavy with left arm. These episodes have been going on for about 3 months. She denies any significant trauma, however she states she may have been related to picking up and playing with her niece.  -- Denies any shortness of breath, palpitations, orthopnea, DOE, lower extremity edema, or syncope.  -- She denies any cardiac history, nor any pertinent family history of heart issues.  -- Aside from walking, patient does not do any additional exercise. She does not watch her diet.  -- Current functional status: No limitations, able to perform ADLs and iADLs without any issues.   Active Medications: Current Medications[1]  Past Medical History: Medical History[2]   Past Surgical History: Surgical History[3]   Allergies: is allergic to aspirin, metoclopramide, venom-honey bee, acetaminophen , and metronidazole .   Family History: Family History[4]    Social History:    Review of Systems:  A complete review of systems was performed. All was negative unless listed in HPI.    Objective:  Physical Examination: BP 115/74 (BP Location: Right arm, Patient Position: Sitting)   Pulse 79   Ht 1.524 m (5')   Wt 118 kg (260 lb 12.8 oz)    SpO2 98%   BMI 50.93 kg/m    CONSTITUTIONAL: NAD, AAOx3, pleasant and cooperative HEENT: Mucous membranes moist HEART: RRR no M,R,G. Pulses +2 LE.  LUNGS: CTA B/L, no W, R, R, normal respiratory effort EXTREMITIES: No peripheral edema, warm extremities   An electrocardiogram was performed today which I personally reviewed.  My interpretation is that it demonstrated: NSR, normal   Lab Review: Lab Results  Component Value Date   WBC 14.00 (H) 07/05/2023   HGB 13.8 07/05/2023   HCT 41.5 07/05/2023   MCV 87.3 07/05/2023   PLT 402 07/05/2023     Chemistry      Component Value Date/Time   NA 141 06/07/2023 1201   K 3.6 06/07/2023 1201   CL 103 06/07/2023 1201   CO2 30 06/07/2023 1201   BUN 5 (L) 06/07/2023 1201   CREATININE 1.05 06/07/2023 1201      Component Value Date/Time   CALCIUM 8.6 06/07/2023 1201   AST 19 06/07/2023 1201   ALT 26 06/07/2023 1201   BILITOT 0.2 (L) 06/07/2023 1201      Lab Results  Component Value Date   TSH 0.271 (L) 09/03/2023   Lab Results  Component Value Date   HGBA1C 6.4 (H) 06/07/2023   Lab Results  Component Value Date   CHOL 194 07/05/2023   TRIG 346 (H) 07/05/2023   HDL 31 (L) 07/05/2023    CARDIAC DATA:  EKG:  07/05/23: NSR, LVH  EVENT MONITORS: N/A  ECHOCARDIOGRAM: N/A  STRESS TEST:  N/A  CARDIAC CATHETERIZATION/PCI: N/A  STRUCTURAL CARDIAC PROCEDURES/SURGERIES: N/A  CARDIAC  ELECTROPHYSIOLOGIC STUDIES/PROCEDURES: N/A  OTHER CARDIAC IMAGING: N/A  I have personally reviewed and interpreted the relevant clinical labs and studies as listed above.  I have summarized my findings as above.  Assessment/Plan:   Yvonne [name] is a 38 y.o. female who presents today for chest pain syndrome.    # Chest Pain Syndrome -- Patient is doing well without any complaints of angina or exertional shortness of breath. Her left chest pain compliant is most consistent with MSK pain.  -- No prior TTE or stress testing.  --  CV risk factors: diabetes mellitus, hypertension, obesity (BMI >= 30 kg/m2), and sedentary lifestyle -- Patient is currently warm and well perfused, euvolemic.  Plan:  -- Will check a TTE to assess LV function and for LVH given her HTN history of LVH on EKG.  -- BP goal < 130/80. Managed by PCP. -- HgA1c goal < 7. Managed by PCP -- BMI < 30. Recommend patient discuss with PCP regarding GLP1 for weight loss.   # Routine Health Maintenance -- We had extensive discussion about the importance of continued heart healthy diet, routine exercise, weight loss, abstaining from tobacco use, limiting alcohol intake.  -- We had extensive discussion about the importance of continued close follow-up with PCP regarding lifestyle, risk factor modifications, and routine health maintenance as part of cardiovascular health.   I have personally spent 60 minutes involved in face-to-face and non-face-to-face activities for this patient on the day of the visit.    # RTC: PRN if echo is normal.   Electronically signed by: Carlin Hoe, MD 10/11/2023 10:28 AM        [1] Current Outpatient Medications  Medication Sig Dispense Refill  . albuterol  HFA (PROVENTIL  HFA;VENTOLIN  HFA;PROAIR  HFA) 90 mcg/actuation inhaler Inhale 2 puffs every 4 (four) hours as needed.    SABRA atenoloL (TENORMIN) 25 mg tablet Take 1 tablet (25 mg total) by mouth daily. 90 tablet 3  . busPIRone (BUSPAR) 15 mg tablet Take 1 tablet (15 mg total) by mouth 3 (three) times a day.    . ergocalciferol  (VITAMIN D2) 1,250 mcg (50,000 unit) capsule Take 1 capsule (50,000 Units total) by mouth once a week. 12 capsule 3  . gemfibroziL (LOPID) 600 mg tablet Take 1 tablet (600 mg total) by mouth in the morning and 1 tablet (600 mg total) in the evening. Take before meals. 180 tablet 1  . hydrOXYzine (ATARAX) 50 mg tablet TAKE ONE TABLET (50mg  total) BY MOUTH AT BEDTIME AS NEEDED SLEEP 30 tablet 1  . ibuprofen  (MOTRIN ) 800 mg tablet Take 800 mg by mouth  every 6 (six) hours as needed. 30 tablet 5  . levothyroxine (SYNTHROID) 150 mcg tablet Take 1 tablet (150 mcg total) by mouth in the morning. 30 tablet 1  . linaCLOtide (LINZESS) 145 mcg cap capsule Take one capsule on an empty stomach 30 minutes prior to first meal of the day. Keep medicine in its original container. 90 capsule 3  . metFORMIN  (GLUCOPHAGE ) 500 mg tablet Take 1 tablet (500 mg total) by mouth in the morning and 1 tablet (500 mg total) in the evening. Take with meals. 60 tablet 3  . omeprazole magnesium  (PriLOSEC) 20 mg EC tablet Take 1 tablet (20 mg total) by mouth daily. 90 tablet 3  . ondansetron  (ZOFRAN ) 4 mg tablet Take 1 tablet (4 mg total) by mouth every 8 (eight) hours as needed for nausea or vomiting. 30 tablet 0  . polyethylene glycol (Golytely ) 236-22.74-6.74 -5.86 gram solr  oral solution Mix and drink 2000 mls on day 1 of prep per instructions, refrigerate the remaining 2000 mls and drink on day 2 per colonoscopy instructions. 4000 mL 0  . QUEtiapine  (SEROquel ) 400 mg tablet Take 1 tablet (400 mg total) by mouth nightly. Take with 200mg  tab to equal 600mg     . rizatriptan (MAXALT) 10 mg tablet 1 tablet at headache onset, repeat in 2 hours if needed. Max 2 pills in 24 hours. 12 tablet 11   No current facility-administered medications for this visit.  [2] Past Medical History: Diagnosis Date  . Anxiety   . Asthma (CMD)   . Depression   . Migraine   . Thyroid  disease   [3] Past Surgical History: Procedure Laterality Date  . APPENDECTOMY     Procedure: APPENDECTOMY  . BACK SURGERY  11/2014   Procedure: BACK SURGERY  . CARPAL TUNNEL RELEASE Right 03/20/2019   Procedure: CARPAL TUNNEL RELEASE  . CESAREAN SECTION, UNSPECIFIED     Procedure: CESAREAN SECTION  . CHOLECYSTECTOMY     Procedure: CHOLECYSTECTOMY  . ELBOW SURGERY Right 03/20/2019   Procedure: ELBOW SURGERY; rt cubial tunnel  . FINGER SURGERY Left 12/16/2017   Procedure: FINGER SURGERY; thumb A-1 pulley   . HAND SURGERY Left 07/31/2017   Procedure: HAND SURGERY; carpal tunnel/cubital tunnel  . HYSTERECTOMY   2008   Procedure: HYSTERECTOMY  . OTHER SURGICAL HISTORY     Procedure: OTHER SURGICAL HISTORY (scar tisue revision); x 3  . THYROIDECTOMY Bilateral 08/14/2019   Procedure: THYROIDECTOMY W/ NERVE MONITORING;  Surgeon: Lamar Tamar Burnie PONCE, MD;  Location: [facility];  Service: ENT;  Laterality: Bilateral;  I would greatly appreciate 2 scrub techs on this case.  Please and thank you  3 hrs  . TONSILLECTOMY AND ADENOIDECTOMY     Procedure: TONSILLECTOMY AND ADENOIDECTOMY  . TUBAL LIGATION     Procedure: TUBAL LIGATION  . WRIST SURGERY Right 05/18/2020   Procedure: WRIST SURGERY; right wrist stenosig release  [4] Family History Problem Relation Name Age of Onset  . Stroke Father    . Hypertension Father    . Hyperlipidemia Father    . Diabetes Father    . Arthritis Father    . Hypertension Mother    . Hyperlipidemia Mother    . Diabetes Mother    . Diabetes Maternal Grandmother    . Lung cancer Maternal Grandmother    . Colon cancer Paternal Grandfather

## 2023-10-14 ENCOUNTER — Ambulatory Visit
Admission: EM | Admit: 2023-10-14 | Discharge: 2023-10-14 | Disposition: A | Discharge status: Home / Self Care | Attending: Family Medicine | Admitting: Family Medicine

## 2023-10-14 DIAGNOSIS — N760 Acute vaginitis: Secondary | ICD-10-CM | POA: Insufficient documentation

## 2023-10-14 DIAGNOSIS — B9689 Other specified bacterial agents as the cause of diseases classified elsewhere: Secondary | ICD-10-CM | POA: Insufficient documentation

## 2023-10-14 MED ORDER — METRONIDAZOLE 0.75 % VA GEL: 1.0000 | Freq: Every day | VAGINAL | 0 refills | Status: DC

## 2023-10-14 NOTE — ED Provider Notes (Signed)
 [facility]  Note:  This document was prepared using Conservation officer, historic buildings and may include unintentional dictation errors.  MRN: [id number] DOB: 04-02-1986  Subjective:   Yvonne [name] is a 38 y.o. female presenting for persistent vaginal discharge. Was being treated for BV with Metrogel . Had taken 2 days before the medication was punctured and lost. Would like to get another prescription. Denies fever, n/v, abdominal pain, pelvic pain, rashes, dysuria, urinary frequency, hematuria.    No current facility-administered medications for this encounter.  Current Outpatient Medications:    albuterol  (PROVENTIL  HFA) 108 (90 Base) MCG/ACT inhaler, Inhale 2 puffs into the lungs every 4 (four) hours as needed for wheezing or shortness of breath., Disp: 1 each, Rfl: 0   baclofen  (LIORESAL ) 10 MG tablet, Take 1 tablet (10 mg total) by mouth 2 (two) times daily as needed for muscle spasms., Disp: 30 each, Rfl: 0   dicyclomine  (BENTYL ) 20 MG tablet, Take 1 tablet (20 mg total) by mouth 2 (two) times daily for 14 days., Disp: 28 tablet, Rfl: 0   fluconazole  (DIFLUCAN ) 150 MG tablet, Take 1 tablet (150 mg total) by mouth every 3 (three) days., Disp: 2 tablet, Rfl: 0   furosemide  (LASIX ) 40 MG tablet, Take 1 tablet (40 mg total) by mouth daily., Disp: 5 tablet, Rfl: 0   hydrOXYzine (ATARAX) 50 MG tablet, Take 50 mg by mouth 3 (three) times daily., Disp: , Rfl:    ibuprofen  (ADVIL ) 800 MG tablet, Take 1 tablet (800 mg total) by mouth 3 (three) times daily., Disp: 21 tablet, Rfl: 0   levothyroxine (SYNTHROID) 75 MCG tablet, Take 75 mcg by mouth daily before breakfast., Disp: , Rfl:    lidocaine  (XYLOCAINE ) 2 % solution, Use as directed 15 mLs in the mouth or throat every 3 (three) hours as needed for mouth pain. Swish/gargle and spit out, Disp: 100 mL, Rfl: 0   metFORMIN  (GLUCOPHAGE ) 500 MG tablet, Take 1 tablet (500 mg total) by mouth 2 (two) times daily with a meal.,  Disp: 60 tablet, Rfl: 0   metroNIDAZOLE  (METROGEL ) 0.75 % vaginal gel, Place 1 Applicatorful vaginally daily. Insert one applicator vaginally once daily., Disp: 70 g, Rfl: 0   potassium chloride  (KLOR-CON ) 10 MEQ tablet, Take 1 tablet (10 mEq total) by mouth daily., Disp: 5 tablet, Rfl: 0   QUEtiapine  (SEROQUEL ) 400 MG tablet, Take 450 mg by mouth at bedtime., Disp: , Rfl:    SUMAtriptan  (IMITREX ) 20 MG/ACT nasal spray, Place 1 spray (20 mg total) into the nose as needed for migraine or headache. May repeat in 2 hours if headache persists or recurs.  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 - tx w/otc med   Asthma    albuterol  inhaler   Blood  transfusion    2008 with c/s 4 units transfused   Carpal tunnel syndrome on right    bilateral   Chronic back pain    Depression    Dyspnea    exertion   Headache(784.0)    otc ibuprofen  800mg    Heart murmur    as a child   Obesity    Pneumonia    as a child   Thyroid  disease      Past Surgical History:  Procedure Laterality Date   ABDOMINAL HYSTERECTOMY N/A 09/29/2012   Procedure: HYSTERECTOMY ABDOMINAL;  Surgeon: Jon CINDERELLA Rummer, MD;  Location: [facility];  Service: Gynecology;  Laterality: N/A;   APPENDECTOMY     BACK SURGERY     BILATERAL SALPINGECTOMY Bilateral 09/29/2012   Procedure: BILATERAL SALPINGECTOMY;  Surgeon: Jon CINDERELLA Rummer,  MD;  Location: [facility];  Service: Gynecology;  Laterality: Bilateral;   CESAREAN SECTION     CHOLECYSTECTOMY     CYSTOSCOPY Bilateral 09/29/2012   Procedure: CYSTOSCOPY;  Surgeon: Jon CINDERELLA Rummer, MD;  Location: [facility];  Service: Gynecology;  Laterality: Bilateral;   ENDOMETRIAL ABLATION     EXAMINATION UNDER ANESTHESIA N/A 10/16/2012   Procedure: EXAM UNDER ANESTHESIA;  Surgeon: Jon CINDERELLA Rummer, MD;  Location: [facility];  Service: Gynecology;  Laterality: N/A;  Suturing Vaginal Cuff   HAND SURGERY Right    carpal tunnel   LAPAROSCOPIC ASSISTED VAGINAL HYSTERECTOMY N/A 09/29/2012   Procedure: LAPAROSCOPIC ASSISTED VAGINAL HYSTERECTOMY;  Surgeon: Jon CINDERELLA Rummer, MD;  Location: [facility];  Service: Gynecology;  Laterality: N/A;   LAPAROSCOPIC HYSTERECTOMY Bilateral 09/29/2012   Procedure: HYSTERECTOMY TOTAL LAPAROSCOPIC;  Surgeon: Jon CINDERELLA Rummer, MD;  Location: [facility];  Service: Gynecology;  Laterality: Bilateral;  Total Laparoscopic Hysterectomy, Bilateral Salpingectomies, Cystoscopy poss. LAVH poss. TAH   LAPAROSCOPY     x3   LUMBAR LAMINECTOMY N/A 11/05/2014   Procedure: RIGHT L4-5 MICRODISCECTOMY ;  Surgeon: Lynwood FORBES Better, MD;  Location: [facility];  Service: Orthopedics;  Laterality: N/A;   LUMBAR LAMINECTOMY/DECOMPRESSION MICRODISCECTOMY N/A 04/10/2016   Procedure: Right L4-5 Microdiscectomy;  Surgeon: Lynwood FORBES Better, MD;  Location: [facility];  Service: Orthopedics;  Laterality: N/A;   THYROIDECTOMY     THYROIDECTOMY     TONSILLECTOMY     T & A   TRANSFORAMINAL LUMBAR INTERBODY FUSION (TLIF) WITH PEDICLE SCREW FIXATION 1 LEVEL N/A 02/13/2017   Procedure: Transforaminal Lumbar Interbody Fusion - Lumbar four-five;  Surgeon: Joshua Alm RAMAN, MD;  Location: [facility] OR;  Service: Neurosurgery;  Laterality: N/A;   TUBAL LIGATION      Family History  Problem Relation Age of Onset   Diabetes Mother    Diabetes Maternal Grandmother    Stroke Father     Social History   Tobacco Use   Smoking status: Former     Current packs/day: 0.00    Types: Cigarettes    Quit date: 10/17/2015    Years since quitting: 7.9   Smokeless tobacco: Never  Vaping Use   Vaping status: Never Used  Substance Use Topics   Alcohol use: Yes    Comment: occ   Drug use: No    ROS   Objective:   Vitals: BP (!) 150/85 (BP Location: Left Wrist)   Pulse 76   Temp 98.2 F (36.8 C) (Oral)   Resp 18   LMP 07/25/2012   SpO2 97%   Physical Exam Constitutional:      General: She is not in acute distress.  Appearance: Normal appearance. She is well-developed. She is not ill-appearing, toxic-appearing or diaphoretic.  HENT:     Head: Normocephalic and atraumatic.     Nose: Nose normal.     Mouth/Throat:     Mouth: Mucous membranes are moist.     Pharynx: Oropharynx is clear.  Eyes:     General: No scleral icterus.       Right eye: No discharge.        Left eye: No discharge.     Extraocular Movements: Extraocular movements intact.     Conjunctiva/sclera: Conjunctivae normal.  Cardiovascular:     Rate and Rhythm: Normal rate.  Pulmonary:     Effort: Pulmonary effort is normal.  Abdominal:     General: Bowel sounds are normal. There is no distension.     Palpations: Abdomen is soft. There is no mass.     Tenderness: There is no abdominal tenderness. There is no right CVA tenderness, left CVA tenderness, guarding or rebound.  Skin:    General: Skin is warm and dry.  Neurological:     General: No focal deficit present.     Mental Status: She is alert and oriented to person, place, and time.  Psychiatric:        Mood and Affect: Mood normal.        Behavior: Behavior normal.        Thought Content: Thought content normal.        Judgment: Judgment normal.     Assessment and Plan :   PDMP not reviewed this encounter.  1. Bacterial vaginosis     Refilled her Metrogel . Patient requested repeat testing, labs pending.    Christopher Savannah, PA-C 10/14/23 1725

## 2023-10-14 NOTE — ED Triage Notes (Signed)
 Pt reports she was diagnoses with BV on 10/04/23 and she was not able to finish the medication. Pt wants to be treat it again.

## 2023-10-15 ENCOUNTER — Ambulatory Visit (HOSPITAL_COMMUNITY): Payer: Self-pay

## 2023-10-15 LAB — CERVICOVAGINAL ANCILLARY ONLY
Bacterial Vaginitis (gardnerella): POSITIVE — AB
Candida Glabrata: NEGATIVE
Candida Vaginitis: NEGATIVE
Chlamydia: NEGATIVE
Comment: NEGATIVE
Comment: NEGATIVE
Comment: NEGATIVE
Comment: NEGATIVE
Comment: NEGATIVE
Comment: NORMAL
Neisseria Gonorrhea: NEGATIVE
Trichomonas: NEGATIVE

## 2023-10-31 ENCOUNTER — Ambulatory Visit (HOSPITAL_COMMUNITY): Admission: EM | Admit: 2023-10-31 | Discharge: 2023-10-31 | Disposition: A | Discharge status: Home / Self Care

## 2023-10-31 ENCOUNTER — Encounter (HOSPITAL_COMMUNITY): Payer: Self-pay

## 2023-10-31 DIAGNOSIS — L03012 Cellulitis of left finger: Secondary | ICD-10-CM

## 2023-10-31 MED ORDER — AMOXICILLIN-POT CLAVULANATE 875-125 MG PO TABS: 1.0000 | ORAL_TABLET | Freq: Two times a day (BID) | ORAL | 0 refills | Status: DC

## 2023-10-31 NOTE — Discharge Instructions (Addendum)
 Take the antibiotics twice daily with food. You can soak the area twice daily in warm water  and epsom salt. Ibuprofen  every 6-8 hours for pain. Return to clinic for new or urgent symptoms.

## 2023-10-31 NOTE — ED Provider Notes (Signed)
 [facility]    CSN: [id number] Arrival date & time: 10/31/23  1730      History   Chief Complaint No chief complaint on file.   HPI Yvonne [name] is a 38 y.o. female.   Patient presents to clinic over concern of an infection to the base of her left middle finger nail that has been present for the past week.  She has been trying to stop smoking and in return she has been biting her nails.  The area is painful and swollen, has been treating with rubbing alcohol.  Has not had any drainage from the area.  The history is provided by the patient and medical records.    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 - tx w/otc med   Asthma    albuterol  inhaler   Blood transfusion    2008 with c/s 4 units transfused   Carpal tunnel syndrome on right    bilateral   Chronic back pain    Depression    Dyspnea    exertion   Headache(784.0)    otc ibuprofen  800mg    Heart murmur    as a child   Obesity    Pneumonia    as a child   Thyroid  disease     Patient Active Problem List   Diagnosis Date Noted   S/P lumbar spinal fusion 02/13/2017   Herniated nucleus pulposus, lumbar 04/10/2016   HNP (herniated nucleus pulposus), lumbar 11/05/2014    Class: Chronic   S/P endometrial ablation - HTA 2012 06/03/2012   Encounter for sterilization 06/03/2012   OBESITY 09/03/2007   Migraine headache 09/03/2007    Past Surgical History:  Procedure Laterality Date   ABDOMINAL HYSTERECTOMY N/A 09/29/2012   Procedure: HYSTERECTOMY ABDOMINAL;  Surgeon: Jon CINDERELLA Rummer, MD;  Location: [facility];  Service: Gynecology;  Laterality: N/A;   APPENDECTOMY     BACK SURGERY     BILATERAL SALPINGECTOMY Bilateral 09/29/2012   Procedure: BILATERAL SALPINGECTOMY;  Surgeon: Jon CINDERELLA Rummer, MD;  Location: [facility];  Service: Gynecology;  Laterality: Bilateral;   CESAREAN SECTION     CHOLECYSTECTOMY     CYSTOSCOPY Bilateral  09/29/2012   Procedure: CYSTOSCOPY;  Surgeon: Jon CINDERELLA Rummer, MD;  Location: [facility];  Service: Gynecology;  Laterality: Bilateral;   ENDOMETRIAL ABLATION     EXAMINATION UNDER ANESTHESIA N/A 10/16/2012   Procedure: EXAM UNDER ANESTHESIA;  Surgeon: Jon CINDERELLA Rummer, MD;  Location: [facility];  Service: Gynecology;  Laterality: N/A;  Suturing Vaginal Cuff   HAND SURGERY Right    carpal tunnel   LAPAROSCOPIC ASSISTED VAGINAL HYSTERECTOMY N/A 09/29/2012   Procedure: LAPAROSCOPIC ASSISTED VAGINAL HYSTERECTOMY;  Surgeon: Jon CINDERELLA Rummer, MD;  Location: [facility];  Service: Gynecology;  Laterality: N/A;   LAPAROSCOPIC HYSTERECTOMY Bilateral 09/29/2012   Procedure: HYSTERECTOMY TOTAL LAPAROSCOPIC;  Surgeon: Jon CINDERELLA Rummer, MD;  Location: [facility];  Service: Gynecology;  Laterality: Bilateral;  Total Laparoscopic Hysterectomy, Bilateral Salpingectomies, Cystoscopy poss. LAVH poss. TAH   LAPAROSCOPY     x3   LUMBAR LAMINECTOMY N/A 11/05/2014   Procedure: RIGHT L4-5 MICRODISCECTOMY ;  Surgeon: Lynwood FORBES Better, MD;  Location: [facility];  Service: Orthopedics;  Laterality: N/A;   LUMBAR LAMINECTOMY/DECOMPRESSION MICRODISCECTOMY N/A 04/10/2016   Procedure: Right L4-5 Microdiscectomy;  Surgeon: Lynwood FORBES Better, MD;  Location: [facility];  Service: Orthopedics;  Laterality: N/A;  THYROIDECTOMY     THYROIDECTOMY     TONSILLECTOMY     T & A   TRANSFORAMINAL LUMBAR INTERBODY FUSION (TLIF) WITH PEDICLE SCREW FIXATION 1 LEVEL N/A 02/13/2017   Procedure: Transforaminal Lumbar Interbody Fusion - Lumbar four-five;  Surgeon: Joshua Alm RAMAN, MD;  Location[facility];  Service: Neurosurgery;  Laterality: N/A;   TUBAL LIGATION      OB History     Gravida  4   Para  4   Term  4   Preterm      AB      Living  1      SAB      IAB      Ectopic      Multiple      Live Births  1            Home Medications    Prior to Admission medications   Medication Sig Start Date End Date Taking? Authorizing Provider  busPIRone  (BUSPAR) 5 MG tablet Take 5 mg by mouth 3 (three) times daily.   Yes [provider]  albuterol  (PROVENTIL  HFA) 108 (90 Base) MCG/ACT inhaler Inhale 2 puffs into the lungs every 4 (four) hours as needed for wheezing or shortness of breath. 07/03/22   Viviann Pastor, MD  amoxicillin -clavulanate (AUGMENTIN ) 875-125 MG tablet Take 1 tablet by mouth every 12 (twelve) hours. 10/31/23   Dreama, Velina Drollinger  N, FNP  baclofen  (LIORESAL ) 10 MG tablet Take 1 tablet (10 mg total) by mouth 2 (two) times daily as needed for muscle spasms. Patient not taking: Reported on 10/31/2023 12/08/22   Raspet, Erin K, PA-C  dicyclomine  (BENTYL ) 20 MG tablet Take 1 tablet (20 mg total) by mouth 2 (two) times daily for 14 days. Patient not taking: Reported on 10/31/2023 11/10/22 11/24/22  Waddell Sluder, PA-C  fluconazole  (DIFLUCAN ) 150 MG tablet Take 1 tablet (150 mg total) by mouth every 3 (three) days. Patient not taking: Reported on 10/31/2023 07/14/23   Christopher Savannah, PA-C  hydrOXYzine (ATARAX) 50 MG tablet Take 50 mg by mouth 3 (three) times daily. 06/30/21   [provider]  ibuprofen  (ADVIL ) 800 MG tablet Take 1 tablet (800 mg total) by mouth 3 (three) times daily. Patient not taking: Reported on 10/31/2023 09/30/22   Rising, Asberry, PA-C  levothyroxine (SYNTHROID) 75 MCG tablet Take 75 mcg by mouth daily before breakfast.    [provider]  lidocaine  (XYLOCAINE ) 2 % solution Use as directed 15 mLs in the mouth or throat every 3 (three) hours as needed for mouth pain. Swish/gargle and spit out 09/30/22   Rising, Asberry, PA-C  metFORMIN  (GLUCOPHAGE ) 500 MG tablet Take 1 tablet (500 mg total) by mouth 2 (two) times daily with a meal. 08/31/23   Upstill, Margit, PA-C  potassium chloride  (KLOR-CON ) 10 MEQ tablet Take 1 tablet (10 mEq total) by mouth daily. 09/08/22   Christopher Savannah, PA-C  QUEtiapine  (SEROQUEL ) 400 MG tablet Take 450 mg by mouth at bedtime.    [provider]  SUMAtriptan  (IMITREX ) 20 MG/ACT  nasal spray Place 1 spray (20 mg total) into the nose as needed for migraine or headache. May repeat in 2 hours if headache persists or recurs.  Use nasal spray on the same side as the headache. Patient not taking: Reported on 10/31/2023 07/03/22 07/03/23  Viviann Pastor, MD    Family History Family History  Problem Relation Age of Onset   Diabetes Mother    Diabetes Maternal Grandmother  Stroke Father     Social History Social History   Tobacco Use   Smoking status: Some Days    Current packs/day: 0.00    Types: Cigarettes    Last attempt to quit: 10/17/2015    Years since quitting: 8.0   Smokeless tobacco: Never  Vaping Use   Vaping status: Never Used  Substance Use Topics   Alcohol use: Yes    Comment: occ   Drug use: No     Allergies   Aspirin, Bee venom, Metoclopramide, Reglan [metoclopramide hcl], Acetaminophen , and Metronidazole    Review of Systems Review of Systems  Per HPI  Physical Exam Triage Vital Signs ED Triage Vitals  Encounter Vitals Group     BP 10/31/23 1840 107/72     Girls Systolic BP Percentile --      Girls Diastolic BP Percentile --      Boys Systolic BP Percentile --      Boys Diastolic BP Percentile --      Pulse Rate 10/31/23 1840 88     Resp 10/31/23 1840 16     Temp 10/31/23 1840 99.3 F (37.4 C)     Temp Source 10/31/23 1840 Oral     SpO2 10/31/23 1840 97 %     Weight --      Height --      Head Circumference --      Peak Flow --      Pain Score 10/31/23 1839 8     Pain Loc --      Pain Education --      Exclude from Growth Chart --    No data found.  Updated Vital Signs BP 107/72 (BP Location: Right Arm)   Pulse 88   Temp 99.3 F (37.4 C) (Oral)   Resp 16   LMP 07/25/2012   SpO2 97%   Visual Acuity Right Eye Distance:   Left Eye Distance:   Bilateral Distance:    Right Eye Near:   Left Eye Near:    Bilateral Near:     Physical Exam Vitals and nursing note reviewed.  Constitutional:      Appearance:  Normal appearance.  HENT:     Head: Normocephalic and atraumatic.     Right Ear: External ear normal.     Left Ear: External ear normal.     Nose: Nose normal.     Mouth/Throat:     Mouth: Mucous membranes are moist.  Eyes:     Conjunctiva/sclera: Conjunctivae normal.  Cardiovascular:     Rate and Rhythm: Normal rate.  Pulmonary:     Effort: Pulmonary effort is normal. No respiratory distress.  Skin:    General: Skin is warm and dry.     Capillary Refill: Capillary refill takes less than 2 seconds.      Neurological:     General: No focal deficit present.     Mental Status: She is alert and oriented to person, place, and time.  Psychiatric:        Mood and Affect: Mood normal.        Behavior: Behavior normal. Behavior is cooperative.      UC Treatments / Results  Labs (all labs ordered are listed, but only abnormal results are displayed) Labs Reviewed - No data to display  EKG   Radiology No results found.  Procedures Incision and Drainage  Date/Time: 10/31/2023 7:19 PM  Performed by: Dreama Cindie SAILOR, FNP Authorized by: Dreama Mena SAILOR, FNP   Consent:  Consent obtained:  Verbal   Consent given by:  Patient   Risks, benefits, and alternatives were discussed: yes     Risks discussed:  Bleeding, incomplete drainage and pain   Alternatives discussed:  Alternative treatment Universal protocol:    Procedure explained and questions answered to patient or proxy's satisfaction: yes     Patient identity confirmed:  Verbally with patient Location:    Indications for incision and drainage: Paronychia.   Location:  Upper extremity   Upper extremity location:  Finger   Finger location:  L long finger Pre-procedure details:    Skin preparation:  Antiseptic wash Anesthesia:    Anesthesia method:  Topical application   Topical anesthesia: PainEase. Procedure type:    Complexity:  Simple Procedure details:    Incision types:  Stab incision   Drainage:   Purulent   Drainage amount:  Scant   Wound treatment:  Wound left open Post-procedure details:    Procedure completion:  Tolerated well, no immediate complications  (including critical care time)  Medications Ordered in UC Medications - No data to display  Initial Impression / Assessment and Plan / UC Course  I have reviewed the triage vital signs and the nursing notes.  Pertinent labs & imaging results that were available during my care of the patient were reviewed by me and considered in my medical decision making (see chart for details).  Vitals and triage reviewed, patient is hemodynamically stable.  Paronychia to the left middle finger.  Area cleaned, numbed and a stab incision with scant purulent drainage.  Will place on Augmentin  to cover for oral flora causing the paronychia.  Pain management discussed.  Plan of care, follow-up care return precautions given, no questions at this time.     Final Clinical Impressions(s) / UC Diagnoses   Final diagnoses:  Paronychia of finger of left hand     Discharge Instructions      Take the antibiotics twice daily with food. You can soak the area twice daily in warm water  and epsom salt. Ibuprofen  every 6-8 hours for pain. Return to clinic for new or urgent symptoms.     ED Prescriptions     Medication Sig Dispense Auth. Provider   amoxicillin -clavulanate (AUGMENTIN ) 875-125 MG tablet  (Status: Discontinued) Take 1 tablet by mouth every 12 (twelve) hours. 14 tablet Gerldine Suleiman  N, FNP   amoxicillin -clavulanate (AUGMENTIN ) 875-125 MG tablet Take 1 tablet by mouth every 12 (twelve) hours. 14 tablet Dreama, Chamberlain Steinborn  N, FNP      PDMP not reviewed this encounter.   Dreama Nicholette SAILOR, FNP 10/31/23 1921

## 2023-10-31 NOTE — ED Triage Notes (Signed)
 Patient has infection at the base of her left middle fingernail x 1 week.  Patient states she has been putting rubbing alcohol on the area.

## 2023-11-05 NOTE — Procedures (Signed)
 Yvonne [name]

## 2023-11-22 ENCOUNTER — Other Ambulatory Visit: Payer: Self-pay

## 2023-11-22 ENCOUNTER — Emergency Department (HOSPITAL_BASED_OUTPATIENT_CLINIC_OR_DEPARTMENT_OTHER)
Admission: EM | Admit: 2023-11-22 | Discharge: 2023-11-22 | Disposition: A | Discharge status: Home / Self Care | Attending: Emergency Medicine | Admitting: Emergency Medicine

## 2023-11-22 ENCOUNTER — Encounter (HOSPITAL_BASED_OUTPATIENT_CLINIC_OR_DEPARTMENT_OTHER): Payer: Self-pay

## 2023-11-22 ENCOUNTER — Other Ambulatory Visit (HOSPITAL_BASED_OUTPATIENT_CLINIC_OR_DEPARTMENT_OTHER): Payer: Self-pay

## 2023-11-22 DIAGNOSIS — M545 Low back pain, unspecified: Secondary | ICD-10-CM | POA: Diagnosis present

## 2023-11-22 DIAGNOSIS — G8929 Other chronic pain: Secondary | ICD-10-CM | POA: Diagnosis not present

## 2023-11-22 DIAGNOSIS — X500XXA Overexertion from strenuous movement or load, initial encounter: Secondary | ICD-10-CM | POA: Diagnosis not present

## 2023-11-22 MED ORDER — METHOCARBAMOL 500 MG PO TABS: 500.0000 mg | ORAL_TABLET | Freq: Three times a day (TID) | ORAL | 0 refills | Status: DC | PRN

## 2023-11-22 MED ORDER — METHOCARBAMOL 500 MG PO TABS: 500.0000 mg | ORAL_TABLET | Freq: Three times a day (TID) | ORAL | 0 refills | Status: AC | PRN

## 2023-11-22 MED ORDER — OXYCODONE-ACETAMINOPHEN 5-325 MG PO TABS: 1.0000 | ORAL_TABLET | Freq: Four times a day (QID) | ORAL | 0 refills | Status: AC | PRN

## 2023-11-22 MED ORDER — PREDNISONE 20 MG PO TABS: 20.0000 mg | ORAL_TABLET | Freq: Every day | ORAL | 0 refills | Status: AC

## 2023-11-22 MED ORDER — PREDNISONE 20 MG PO TABS
20.0000 mg | ORAL_TABLET | Freq: Every day | ORAL | 0 refills | Status: DC
  Filled 2023-11-22: qty 4, 4d supply, fill #0

## 2023-11-22 MED ORDER — OXYCODONE-ACETAMINOPHEN 5-325 MG PO TABS
1.0000 | ORAL_TABLET | Freq: Four times a day (QID) | ORAL | 0 refills | Status: DC | PRN
Start: 2023-11-22 — End: 2023-11-22

## 2023-11-22 MED ORDER — KETOROLAC TROMETHAMINE 15 MG/ML IJ SOLN
15.0000 mg | Freq: Once | INTRAMUSCULAR | Status: AC
  Administered 2023-11-22: 15 mg via INTRAMUSCULAR
  Filled 2023-11-22: qty 1

## 2023-11-22 NOTE — ED Notes (Signed)
Pharmacy and/or medications updated with patient

## 2023-11-22 NOTE — ED Notes (Signed)

## 2023-11-22 NOTE — ED Notes (Signed)
 Patient given discharge instructions. Questions were answered. Patient verbalized understanding of discharge instructions and care at home.

## 2023-11-22 NOTE — ED Triage Notes (Addendum)
 Arrives POV with complaints of worsening back pain x2 days. Patient reports that she was lifting a child and the pain developed after that times. Pain is a 10/10. Patient reports a history of back surgery (rods placed in her back).

## 2023-11-22 NOTE — ED Provider Notes (Signed)
 [place] EMERGENCY DEPARTMENT AT [facility] Provider Note   CSN: [id number] Arrival date & time: 11/22/23  9089     Patient presents with: Back Pain   Yvonne [name] is a 38 y.o. female.    Back Pain Patient presents with acute on chronic low back pain.  Has had previous back surgery.  Had previously been in pain management for around 2 years ago she states the provider quit in she did not stay with the group.  Has had worsening pain recently.  States she lifted up her niece who is 35 pounds and more pain.  Worse with certain movements.  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  Persistent low back pain.  No fall.  No numbness or weakness.  No fevers.  No loss of bladder or bowel control.     Prior to Admission medications   Medication Sig Start Date End Date Taking? Authorizing Provider  methocarbamol  (ROBAXIN ) 500 MG tablet Take 1 tablet (500 mg total) by mouth every 8 (eight) hours as needed for muscle spasms. 11/22/23  Yes Patsey Lot, MD  oxyCODONE -acetaminophen  (PERCOCET/ROXICET) 5-325 MG tablet Take 1 tablet by mouth every 6 (six) hours as needed for severe pain (pain score 7-10). 11/22/23  Yes Patsey Lot, MD  predniSONE  (DELTASONE ) 20 MG tablet Take 1 tablet (20 mg total) by mouth daily. 11/22/23  Yes Patsey Lot, MD  albuterol  (PROVENTIL  HFA) 108 (90 Base) MCG/ACT inhaler Inhale 2 puffs into the lungs every 4 (four) hours as needed for wheezing or shortness of breath. 07/03/22   Viviann Pastor, MD  busPIRone (BUSPAR) 5 MG tablet Take 5 mg by mouth 3 (three) times daily.    [provider]  dicyclomine  (BENTYL ) 20 MG tablet Take 1 tablet (20 mg total) by mouth 2 (two) times daily for 14 days. Patient not taking: Reported on 10/31/2023 11/10/22 11/24/22  Waddell Sluder, PA-C  fluconazole  (DIFLUCAN ) 150 MG tablet Take 1 tablet (150 mg total) by mouth every 3 (three) days. Patient not taking: Reported on 10/31/2023  07/14/23   Christopher Savannah, PA-C  hydrOXYzine (ATARAX) 50 MG tablet Take 50 mg by mouth 3 (three) times daily. 06/30/21   [provider]  ibuprofen  (ADVIL ) 800 MG tablet Take 1 tablet (800 mg total) by mouth 3 (three) times daily. Patient not taking: Reported on 10/31/2023 09/30/22   Rising, Asberry, PA-C  levothyroxine (SYNTHROID) 75 MCG tablet Take 75 mcg by mouth daily before breakfast.    [provider]  lidocaine  (XYLOCAINE ) 2 % solution Use as directed 15 mLs in the mouth or throat every 3 (three) hours as needed for mouth pain. Swish/gargle and spit out 09/30/22   Rising, Asberry, PA-C  metFORMIN  (GLUCOPHAGE ) 500 MG tablet Take 1 tablet (500 mg total) by mouth 2 (two) times daily with a meal. 08/31/23   Upstill, Shari, PA-C  potassium chloride  (KLOR-CON ) 10 MEQ tablet Take 1 tablet (10 mEq total) by mouth daily. 09/08/22   Christopher Savannah, PA-C  QUEtiapine  (SEROQUEL ) 400 MG tablet Take 450 mg by mouth at bedtime.    [provider]  SUMAtriptan  (IMITREX ) 20 MG/ACT nasal spray Place 1 spray (20 mg total) into the nose as needed for migraine or headache. May repeat in 2 hours if headache persists or recurs.  Use nasal spray on the same side as the headache. Patient not taking: Reported on 10/31/2023 07/03/22 07/03/23  Viviann Pastor, MD    Allergies: Aspirin, Bee venom,  Metoclopramide, Reglan [metoclopramide hcl], Acetaminophen , and Metronidazole     Review of Systems  Musculoskeletal:  Positive for back pain.    Updated Vital Signs BP 111/67   Pulse 99   Temp 97.7 F (36.5 C) (Oral)   Resp 20   Ht 5' (1.524 m)   Wt 114.3 kg   LMP 07/25/2012   SpO2 100%   BMI 49.22 kg/m   Physical Exam Vitals and nursing note reviewed.  Constitutional:      Appearance: She is obese.  Musculoskeletal:        General: No tenderness.     Comments: Mild lumbar tenderness without step-off or deformity.  Skin:    Capillary Refill: Capillary refill takes less than 2 seconds.   Neurological:     Mental Status: She is oriented to person, place, and time.     (all labs ordered are listed, but only abnormal results are displayed) Labs Reviewed - No data to display  EKG: None  Radiology: No results found.   Procedures   Medications Ordered in the ED  ketorolac  (TORADOL ) 15 MG/ML injection 15 mg (15 mg Intramuscular Given 11/22/23 0939)                                    Medical Decision Making Risk Prescription drug management.   Patient was somewhat acute on chronic low back pain.  Did have potential mild new injury while lifting a child, however doubt fracture.  Do not think x-ray imaging required at this time.  Will treat symptomatically.  Will give some muscle relaxers and some steroids.  Patient is diabetic we will keep an eye on her sugars.  Also given the pain meds.  Follow-up with PCP as needed.       Final diagnoses:  Chronic midline low back pain without sciatica    ED Discharge Orders          Ordered    predniSONE  (DELTASONE ) 20 MG tablet  Daily        11/22/23 0941    methocarbamol  (ROBAXIN ) 500 MG tablet  Every 8 hours PRN        11/22/23 0941    oxyCODONE -acetaminophen  (PERCOCET/ROXICET) 5-325 MG tablet  Every 6 hours PRN        11/22/23 0941               Patsey Lot, MD 11/22/23 [phone]

## 2023-12-12 ENCOUNTER — Other Ambulatory Visit: Payer: Self-pay

## 2023-12-12 ENCOUNTER — Ambulatory Visit: Admission: EM | Admit: 2023-12-12 | Discharge: 2023-12-12 | Disposition: A | Discharge status: Home / Self Care

## 2023-12-12 DIAGNOSIS — N611 Abscess of the breast and nipple: Secondary | ICD-10-CM | POA: Diagnosis not present

## 2023-12-12 MED ORDER — DOXYCYCLINE HYCLATE 100 MG PO CAPS: 100.0000 mg | ORAL_CAPSULE | Freq: Two times a day (BID) | ORAL | 0 refills | Status: AC

## 2023-12-12 NOTE — Discharge Instructions (Addendum)
 Please take medication as prescribed. Take with food to avoid upset stomach. Finish the full course It can take about 3 full days of medicine to make a noticeable improvement in symptoms  Continue ibuprofen  Use warm compress on the area to soften and reduce swelling

## 2023-12-12 NOTE — ED Triage Notes (Signed)
 Pt has an abscess approx the size of a quarter under left breastx1wk. The abscess is open. The abscess is erythematous

## 2023-12-12 NOTE — ED Provider Notes (Signed)
 [facility]    CSN: [id number] Arrival date & time: 12/12/23  1636      History   Chief Complaint Chief Complaint  Patient presents with   Abscess    HPI Yvonne [name] is a 38 y.o. female.  1 week of possible abscess of the left breast Increasing in size and more painful the last few days Not sure about drainage No fevers Using ibuprofen    She reports frequent sweating and irritation in the area due to large breast size.    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 - tx w/otc med   Asthma    albuterol  inhaler   Blood transfusion    2008 with c/s 4 units transfused   Carpal tunnel syndrome on right    bilateral   Chronic back pain    Depression    Dyspnea    exertion   Headache(784.0)    otc ibuprofen  800mg    Heart murmur    as a child   Obesity    Pneumonia    as a child   Thyroid  disease     Patient Active Problem List   Diagnosis Date Noted   S/P lumbar spinal fusion 02/13/2017   Herniated nucleus pulposus, lumbar 04/10/2016   HNP (herniated nucleus pulposus), lumbar 11/05/2014    Class: Chronic   S/P endometrial ablation - HTA 2012 06/03/2012   Encounter for sterilization 06/03/2012   OBESITY 09/03/2007   Migraine headache 09/03/2007    Past Surgical History:  Procedure Laterality Date   ABDOMINAL HYSTERECTOMY N/A 09/29/2012   Procedure: HYSTERECTOMY ABDOMINAL;  Surgeon: Jon CINDERELLA Rummer, MD;  Location: [facility];  Service: Gynecology;  Laterality: N/A;   APPENDECTOMY     BACK SURGERY     BILATERAL SALPINGECTOMY Bilateral 09/29/2012   Procedure: BILATERAL SALPINGECTOMY;  Surgeon: Jon CINDERELLA Rummer, MD;  Location: [facility];  Service: Gynecology;  Laterality: Bilateral;   CESAREAN SECTION     CHOLECYSTECTOMY     CYSTOSCOPY Bilateral 09/29/2012   Procedure: CYSTOSCOPY;  Surgeon: Jon CINDERELLA Rummer, MD;  Location: [facility];  Service: Gynecology;  Laterality: Bilateral;    ENDOMETRIAL ABLATION     EXAMINATION UNDER ANESTHESIA N/A 10/16/2012   Procedure: EXAM UNDER ANESTHESIA;  Surgeon: Jon CINDERELLA Rummer, MD;  Location: [facility];  Service: Gynecology;  Laterality: N/A;  Suturing Vaginal Cuff   HAND SURGERY Right    carpal tunnel   LAPAROSCOPIC ASSISTED VAGINAL HYSTERECTOMY N/A 09/29/2012   Procedure: LAPAROSCOPIC ASSISTED VAGINAL HYSTERECTOMY;  Surgeon: Jon CINDERELLA Rummer, MD;  Location: [facility];  Service: Gynecology;  Laterality: N/A;   LAPAROSCOPIC HYSTERECTOMY Bilateral 09/29/2012   Procedure: HYSTERECTOMY TOTAL LAPAROSCOPIC;  Surgeon: Jon CINDERELLA Rummer, MD;  Location: [facility];  Service: Gynecology;  Laterality: Bilateral;  Total Laparoscopic Hysterectomy, Bilateral Salpingectomies, Cystoscopy poss. LAVH poss. TAH   LAPAROSCOPY     x3   LUMBAR LAMINECTOMY N/A 11/05/2014   Procedure: RIGHT L4-5 MICRODISCECTOMY ;  Surgeon: Lynwood FORBES Better, MD;  Location: [facility];  Service: Orthopedics;  Laterality: N/A;   LUMBAR LAMINECTOMY/DECOMPRESSION MICRODISCECTOMY N/A 04/10/2016   Procedure: Right L4-5 Microdiscectomy;  Surgeon: Lynwood FORBES Better, MD;  Location: [facility];  Service: Orthopedics;  Laterality: N/A;   THYROIDECTOMY     THYROIDECTOMY     TONSILLECTOMY     T & A   TRANSFORAMINAL LUMBAR INTERBODY FUSION (TLIF) WITH PEDICLE  SCREW FIXATION 1 LEVEL N/A 02/13/2017   Procedure: Transforaminal Lumbar Interbody Fusion - Lumbar four-five;  Surgeon: Joshua Alm RAMAN, MD;  Location: [facility] OR;  Service: Neurosurgery;  Laterality: N/A;   TUBAL LIGATION      OB History     Gravida  4   Para  4   Term  4   Preterm      AB      Living  1      SAB      IAB      Ectopic      Multiple      Live Births  1            Home Medications    Prior to Admission medications   Medication Sig Start Date End Date Taking? Authorizing Provider  doxycycline  (VIBRAMYCIN ) 100 MG capsule Take 1 capsule (100 mg total) by mouth 2 (two) times daily for 7 days. 12/12/23 12/19/23 Yes Valentin Benney,  Asberry, PA-C  linaclotide [facility]) 145 MCG CAPS capsule Take 145 mcg by mouth daily before breakfast. 08/28/23  Yes [provider]  ondansetron  (ZOFRAN ) 8 MG tablet Take 8 mg by mouth every 8 (eight) hours as needed. 06/08/21  Yes [provider]  QUEtiapine  (SEROQUEL  XR) 200 MG 24 hr tablet Take 200 mg by mouth at bedtime. 12/04/23  Yes [provider]  albuterol  (PROVENTIL  HFA) 108 (90 Base) MCG/ACT inhaler Inhale 2 puffs into the lungs every 4 (four) hours as needed for wheezing or shortness of breath. 07/03/22   Viviann Pastor, MD  busPIRone (BUSPAR) 5 MG tablet Take 5 mg by mouth 3 (three) times daily.    [provider]  dicyclomine  (BENTYL ) 20 MG tablet Take 1 tablet (20 mg total) by mouth 2 (two) times daily for 14 days. Patient not taking: Reported on 10/31/2023 11/10/22 11/24/22  Waddell Sluder, PA-C  fluconazole  (DIFLUCAN ) 150 MG tablet Take 1 tablet (150 mg total) by mouth every 3 (three) days. Patient not taking: Reported on 10/31/2023 07/14/23   Christopher Savannah, PA-C  hydrOXYzine (ATARAX) 50 MG tablet Take 50 mg by mouth 3 (three) times daily. 06/30/21   [provider]  ibuprofen  (ADVIL ) 800 MG tablet Take 1 tablet (800 mg total) by mouth 3 (three) times daily. Patient not taking: Reported on 10/31/2023 09/30/22   Shyvonne Chastang, Asberry, PA-C  levothyroxine (SYNTHROID) 75 MCG tablet Take 75 mcg by mouth daily before breakfast.    [provider]  lidocaine  (XYLOCAINE ) 2 % solution Use as directed 15 mLs in the mouth or throat every 3 (three) hours as needed for mouth pain. Swish/gargle and spit out 09/30/22   Corin Formisano, Asberry, PA-C  metFORMIN  (GLUCOPHAGE ) 500 MG tablet Take 1 tablet (500 mg total) by mouth 2 (two) times daily with a meal. 08/31/23   Upstill, Margit, PA-C  methocarbamol  (ROBAXIN ) 500 MG tablet Take 1 tablet (500 mg total) by mouth every 8 (eight) hours as needed for muscle spasms. 11/22/23   Patsey Lot, MD  oxyCODONE -acetaminophen   (PERCOCET/ROXICET) 5-325 MG tablet Take 1 tablet by mouth every 6 (six) hours as needed for severe pain (pain score 7-10). 11/22/23   Patsey Lot, MD  potassium chloride  (KLOR-CON ) 10 MEQ tablet Take 1 tablet (10 mEq total) by mouth daily. 09/08/22   Christopher Savannah, PA-C  predniSONE  (DELTASONE ) 20 MG tablet Take 1 tablet (20 mg total) by mouth daily. 11/22/23   Patsey Lot, MD  QUEtiapine  (SEROQUEL ) 400 MG tablet Take 450 mg by mouth at bedtime.  [provider]  SUMAtriptan  (IMITREX ) 20 MG/ACT nasal spray Place 1 spray (20 mg total) into the nose as needed for migraine or headache. May repeat in 2 hours if headache persists or recurs.  Use nasal spray on the same side as the headache. Patient not taking: Reported on 10/31/2023 07/03/22 07/03/23  Viviann Pastor, MD  WEGOVY 0.5 MG/0.5ML SOAJ SQ injection Inject 0.5 mg into the skin once a week.    [provider]    Family History Family History  Problem Relation Age of Onset   Diabetes Mother    Diabetes Maternal Grandmother    Stroke Father     Social History Social History   Tobacco Use   Smoking status: Former    Current packs/day: 0.00    Types: Cigarettes    Quit date: 10/17/2015    Years since quitting: 8.1   Smokeless tobacco: Never  Vaping Use   Vaping status: Never Used  Substance Use Topics   Alcohol use: Yes    Comment: occ   Drug use: Yes    Types: Marijuana     Allergies   Aspirin, Bee venom, Metoclopramide, Reglan [metoclopramide hcl], Acetaminophen , and Metronidazole    Review of Systems Review of Systems  As per HPI  Physical Exam Triage Vital Signs ED Triage Vitals  Encounter Vitals Group     BP 12/12/23 1715 135/81     Girls Systolic BP Percentile --      Girls Diastolic BP Percentile --      Boys Systolic BP Percentile --      Boys Diastolic BP Percentile --      Pulse Rate 12/12/23 1715 83     Resp 12/12/23 1715 17     Temp 12/12/23 1715 98 F (36.7 C)     Temp Source  12/12/23 1715 Oral     SpO2 12/12/23 1715 98 %     Weight --      Height --      Head Circumference --      Peak Flow --      Pain Score 12/12/23 1711 7     Pain Loc --      Pain Education --      Exclude from Growth Chart --    No data found.  Updated Vital Signs BP 135/81   Pulse 83   Temp 98 F (36.7 C) (Oral)   Resp 17   LMP 07/25/2012   SpO2 98%   Visual Acuity Right Eye Distance:   Left Eye Distance:   Bilateral Distance:    Right Eye Near:   Left Eye Near:    Bilateral Near:     Physical Exam Vitals and nursing note reviewed. Exam conducted with a chaperone present Editor, commissioning).  Constitutional:      General: She is not in acute distress.    Appearance: Normal appearance.  Cardiovascular:     Rate and Rhythm: Normal rate and regular rhythm.     Heart sounds: Normal heart sounds.  Pulmonary:     Effort: Pulmonary effort is normal.     Breath sounds: Normal breath sounds.  Chest:  Breasts:    Left: No inverted nipple or nipple discharge.       Comments: Large breast size. On underside of left breast is abscess; area of induration, tenderness. No other skin changes of breast, normal nipple.  Skin:    Findings: Abscess and erythema present.  Neurological:     Mental Status: She  is alert and oriented to person, place, and time.     UC Treatments / Results  Labs (all labs ordered are listed, but only abnormal results are displayed) Labs Reviewed - No data to display  EKG   Radiology No results found.  Procedures Procedures (including critical care time)  Medications Ordered in UC Medications - No data to display  Initial Impression / Assessment and Plan / UC Course  I have reviewed the triage vital signs and the nursing notes.  Pertinent labs & imaging results that were available during my care of the patient were reviewed by me and considered in my medical decision making (see chart for details).  Abscess underside of left breast  Firm to  touch  Doxycycline  BID x 7 days  Ibuprofen  and warm compress for pain/swelling Return if needed Agrees to plan, no questions   Final Clinical Impressions(s) / UC Diagnoses   Final diagnoses:  Abscess of left breast     Discharge Instructions      Please take medication as prescribed. Take with food to avoid upset stomach. Finish the full course It can take about 3 full days of medicine to make a noticeable improvement in symptoms  Continue ibuprofen  Use warm compress on the area to soften and reduce swelling    ED Prescriptions     Medication Sig Dispense Auth. Provider   doxycycline  (VIBRAMYCIN ) 100 MG capsule Take 1 capsule (100 mg total) by mouth 2 (two) times daily for 7 days. 14 capsule Jamiria Langill, Asberry, PA-C      PDMP not reviewed this encounter.   Herta Hink, PA-C 12/12/23 1822

## 2024-01-21 ENCOUNTER — Ambulatory Visit (HOSPITAL_COMMUNITY)
Admission: EM | Admit: 2024-01-21 | Discharge: 2024-01-21 | Disposition: A | Discharge status: Home / Self Care | Attending: Family Medicine | Admitting: Family Medicine

## 2024-01-21 ENCOUNTER — Encounter (HOSPITAL_COMMUNITY): Payer: Self-pay | Admitting: *Deleted

## 2024-01-21 DIAGNOSIS — L03011 Cellulitis of right finger: Secondary | ICD-10-CM | POA: Diagnosis not present

## 2024-01-21 MED ORDER — AMOXICILLIN-POT CLAVULANATE 875-125 MG PO TABS: 1.0000 | ORAL_TABLET | Freq: Two times a day (BID) | ORAL | 0 refills | Status: AC

## 2024-01-21 NOTE — ED Triage Notes (Signed)
 Pt states that she had a hand nail and pulled it since she has right middle finger swelling and pain X 8 days.

## 2024-01-21 NOTE — Discharge Instructions (Addendum)
 You were diagnosed with a paronychia of your finger.  There is nothing to open/drain at this time.  I have sent out an oral antibiotic for you.  I recommend warm epson salt soaks as well.  Please return if not improving or worsening.

## 2024-01-21 NOTE — ED Provider Notes (Signed)
 [facility]    CSN: [id number] Arrival date & time: 01/21/24  1514      History   Chief Complaint Chief Complaint  Patient presents with   Hand Pain    HPI Yvonne [name] is a 38 y.o. female.    Hand Pain  Patient is here for right finger pain.  She pulled a hang nail, and since then having pain and swelling to the right middle finger.  She has had one similar opened and drained.        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 - tx w/otc med   Asthma    albuterol  inhaler   Blood transfusion    2008 with c/s 4 units transfused   Carpal tunnel syndrome on right    bilateral   Chronic back pain    Depression    Dyspnea    exertion   Headache(784.0)    otc ibuprofen  800mg    Heart murmur    as a child   Obesity    Pneumonia    as a child   Thyroid  disease     Patient Active Problem List   Diagnosis Date Noted   S/P lumbar spinal fusion 02/13/2017   Herniated nucleus pulposus, lumbar 04/10/2016   HNP (herniated nucleus pulposus), lumbar 11/05/2014    Class: Chronic   S/P endometrial ablation - HTA 2012 06/03/2012   Encounter for sterilization 06/03/2012   OBESITY 09/03/2007   Migraine headache 09/03/2007    Past Surgical History:  Procedure Laterality Date   ABDOMINAL HYSTERECTOMY N/A 09/29/2012   Procedure: HYSTERECTOMY ABDOMINAL;  Surgeon: Jon CINDERELLA Rummer, MD;  Location: [facility];  Service: Gynecology;  Laterality: N/A;   APPENDECTOMY     BACK SURGERY     BILATERAL SALPINGECTOMY Bilateral 09/29/2012   Procedure: BILATERAL SALPINGECTOMY;  Surgeon: Jon CINDERELLA Rummer, MD;  Location: [facility];  Service: Gynecology;  Laterality: Bilateral;   CESAREAN SECTION     CHOLECYSTECTOMY     CYSTOSCOPY Bilateral 09/29/2012   Procedure: CYSTOSCOPY;  Surgeon: Jon CINDERELLA Rummer, MD;  Location: [facility];  Service: Gynecology;  Laterality: Bilateral;   ENDOMETRIAL ABLATION      EXAMINATION UNDER ANESTHESIA N/A 10/16/2012   Procedure: EXAM UNDER ANESTHESIA;  Surgeon: Jon CINDERELLA Rummer, MD;  Location: [facility];  Service: Gynecology;  Laterality: N/A;  Suturing Vaginal Cuff   HAND SURGERY Right    carpal tunnel   LAPAROSCOPIC ASSISTED VAGINAL HYSTERECTOMY N/A 09/29/2012   Procedure: LAPAROSCOPIC ASSISTED VAGINAL HYSTERECTOMY;  Surgeon: Jon CINDERELLA Rummer, MD;  Location: [facility];  Service: Gynecology;  Laterality: N/A;   LAPAROSCOPIC HYSTERECTOMY Bilateral 09/29/2012   Procedure: HYSTERECTOMY TOTAL LAPAROSCOPIC;  Surgeon: Jon CINDERELLA Rummer, MD;  Location: [facility];  Service: Gynecology;  Laterality: Bilateral;  Total Laparoscopic Hysterectomy, Bilateral Salpingectomies, Cystoscopy poss. LAVH poss. TAH   LAPAROSCOPY     x3   LUMBAR LAMINECTOMY N/A 11/05/2014   Procedure: RIGHT L4-5 MICRODISCECTOMY ;  Surgeon: Lynwood FORBES Better, MD;  Location: [facility];  Service: Orthopedics;  Laterality: N/A;   LUMBAR LAMINECTOMY/DECOMPRESSION MICRODISCECTOMY N/A 04/10/2016   Procedure: Right L4-5 Microdiscectomy;  Surgeon: Lynwood FORBES Better, MD;  Location: [facility];  Service: Orthopedics;  Laterality: N/A;   THYROIDECTOMY     THYROIDECTOMY     TONSILLECTOMY     T & A   TRANSFORAMINAL LUMBAR INTERBODY FUSION (TLIF) WITH  PEDICLE SCREW FIXATION 1 LEVEL N/A 02/13/2017   Procedure: Transforaminal Lumbar Interbody Fusion - Lumbar four-five;  Surgeon: Joshua Alm RAMAN, MD;  Location: [facility];  Service: Neurosurgery;  Laterality: N/A;   TUBAL LIGATION      OB History     Gravida  4   Para  4   Term  4   Preterm      AB      Living  1      SAB      IAB      Ectopic      Multiple      Live Births  1            Home Medications    Prior to Admission medications   Medication Sig Start Date End Date Taking? Authorizing Provider  atenolol (TENORMIN) 25 MG tablet Take 25 mg by mouth daily.   Yes [provider]  busPIRone (BUSPAR) 5 MG tablet Take 5 mg by mouth 3 (three) times daily.   Yes  [provider]  hydrOXYzine (ATARAX) 50 MG tablet Take 50 mg by mouth 3 (three) times daily. 06/30/21  Yes [provider]  levothyroxine (SYNTHROID) 75 MCG tablet Take 75 mcg by mouth daily before breakfast.   Yes [provider]  linaclotide (LINZESS) 145 MCG CAPS capsule Take 145 mcg by mouth daily before breakfast. 08/28/23  Yes [provider]  methocarbamol  (ROBAXIN ) 500 MG tablet Take 1 tablet (500 mg total) by mouth every 8 (eight) hours as needed for muscle spasms. 11/22/23  Yes Patsey Lot, MD  potassium chloride  (KLOR-CON ) 10 MEQ tablet Take 1 tablet (10 mEq total) by mouth daily. 09/08/22  Yes Christopher Savannah, PA-C  QUEtiapine  (SEROQUEL  XR) 200 MG 24 hr tablet Take 200 mg by mouth at bedtime. 12/04/23  Yes [provider]  QUEtiapine  (SEROQUEL ) 400 MG tablet Take 450 mg by mouth at bedtime.   Yes [provider]  Vitamin D , Ergocalciferol , (DRISDOL) 1.25 MG (50000 UNIT) CAPS capsule Take 50,000 Units by mouth once a week.   Yes [provider]  WEGOVY 0.5 MG/0.5ML SOAJ SQ injection Inject 0.5 mg into the skin once a week.   Yes [provider]  albuterol  (PROVENTIL  HFA) 108 (90 Base) MCG/ACT inhaler Inhale 2 puffs into the lungs every 4 (four) hours as needed for wheezing or shortness of breath. 07/03/22   Viviann Pastor, MD  dicyclomine  (BENTYL ) 20 MG tablet Take 1 tablet (20 mg total) by mouth 2 (two) times daily for 14 days. Patient not taking: Reported on 10/31/2023 11/10/22 11/24/22  Waddell Sluder, PA-C  fluconazole  (DIFLUCAN ) 150 MG tablet Take 1 tablet (150 mg total) by mouth every 3 (three) days. Patient not taking: Reported on 10/31/2023 07/14/23   Christopher Savannah, PA-C  ibuprofen  (ADVIL ) 800 MG tablet Take 1 tablet (800 mg total) by mouth 3 (three) times daily. Patient not taking: Reported on 10/31/2023 09/30/22   Rising, Asberry, PA-C  lidocaine  (XYLOCAINE ) 2 % solution Use as directed 15 mLs in the mouth or throat  every 3 (three) hours as needed for mouth pain. Swish/gargle and spit out 09/30/22   Rising, Asberry, PA-C  metFORMIN  (GLUCOPHAGE ) 500 MG tablet Take 1 tablet (500 mg total) by mouth 2 (two) times daily with a meal. 08/31/23   Upstill, Shari, PA-C  ondansetron  (ZOFRAN ) 8 MG tablet Take 8 mg by mouth every 8 (eight) hours as needed. 06/08/21   [provider]  oxyCODONE -acetaminophen  (PERCOCET/ROXICET) 5-325 MG  tablet Take 1 tablet by mouth every 6 (six) hours as needed for severe pain (pain score 7-10). 11/22/23   Patsey Lot, MD  predniSONE  (DELTASONE ) 20 MG tablet Take 1 tablet (20 mg total) by mouth daily. 11/22/23   Patsey Lot, MD  SUMAtriptan  (IMITREX ) 20 MG/ACT nasal spray Place 1 spray (20 mg total) into the nose as needed for migraine or headache. May repeat in 2 hours if headache persists or recurs.  Use nasal spray on the same side as the headache. Patient not taking: Reported on 10/31/2023 07/03/22 07/03/23  Viviann Pastor, MD    Family History Family History  Problem Relation Age of Onset   Diabetes Mother    Diabetes Maternal Grandmother    Stroke Father     Social History Social History   Tobacco Use   Smoking status: Former    Current packs/day: 0.00    Types: Cigarettes    Quit date: 10/17/2015    Years since quitting: 8.2   Smokeless tobacco: Never  Vaping Use   Vaping status: Never Used  Substance Use Topics   Alcohol use: Yes    Comment: occ   Drug use: Yes    Types: Marijuana     Allergies   Aspirin, Bee venom, Metoclopramide, Reglan [metoclopramide hcl], Acetaminophen , and Metronidazole    Review of Systems Review of Systems  Constitutional: Negative.   HENT: Negative.    Respiratory: Negative.    Cardiovascular: Negative.   Gastrointestinal: Negative.   Musculoskeletal: Negative.   Skin:  Positive for wound.     Physical Exam Triage Vital Signs ED Triage Vitals  Encounter Vitals Group     BP 01/21/24 1537 105/68     Girls  Systolic BP Percentile --      Girls Diastolic BP Percentile --      Boys Systolic BP Percentile --      Boys Diastolic BP Percentile --      Pulse Rate 01/21/24 1537 80     Resp 01/21/24 1537 18     Temp 01/21/24 1537 98 F (36.7 C)     Temp Source 01/21/24 1537 Oral     SpO2 01/21/24 1537 97 %     Weight --      Height --      Head Circumference --      Peak Flow --      Pain Score 01/21/24 1534 8     Pain Loc --      Pain Education --      Exclude from Growth Chart --    No data found.  Updated Vital Signs BP 105/68 (BP Location: Left Arm)   Pulse 80   Temp 98 F (36.7 C) (Oral)   Resp 18   LMP 07/25/2012   SpO2 97%   Visual Acuity Right Eye Distance:   Left Eye Distance:   Bilateral Distance:    Right Eye Near:   Left Eye Near:    Bilateral Near:     Physical Exam Constitutional:      Appearance: Normal appearance. She is normal weight.  Skin:    Comments: There is slight redness/swelling to the right middle finger, just lateral to the nail;  no area of fluctuance, no pus collection noted on exam;  area is tender  Neurological:     General: No focal deficit present.     Mental Status: She is alert.  Psychiatric:        Mood and Affect: Mood normal.  UC Treatments / Results  Labs (all labs ordered are listed, but only abnormal results are displayed) Labs Reviewed - No data to display  EKG   Radiology No results found.  Procedures Procedures (including critical care time)  Medications Ordered in UC Medications - No data to display  Initial Impression / Assessment and Plan / UC Course  I have reviewed the triage vital signs and the nursing notes.  Pertinent labs & imaging results that were available during my care of the patient were reviewed by me and considered in my medical decision making (see chart for details).   Final Clinical Impressions(s) / UC Diagnoses   Final diagnoses:  Paronychia of finger, right     Discharge  Instructions      You were diagnosed with a paronychia of your finger.  There is nothing to open/drain at this time.  I have sent out an oral antibiotic for you.  I recommend warm epson salt soaks as well.  Please return if not improving or worsening.     ED Prescriptions     Medication Sig Dispense Auth. Provider   amoxicillin -clavulanate (AUGMENTIN ) 875-125 MG tablet Take 1 tablet by mouth every 12 (twelve) hours. 14 tablet Darral Longs, MD      PDMP not reviewed this encounter.   Darral Longs, MD 01/21/24 [phone]

## 2024-01-23 ENCOUNTER — Other Ambulatory Visit: Payer: Self-pay

## 2024-01-23 ENCOUNTER — Ambulatory Visit
Admission: EM | Admit: 2024-01-23 | Discharge: 2024-01-23 | Disposition: A | Discharge status: Home / Self Care | Attending: Family Medicine | Admitting: Family Medicine

## 2024-01-23 DIAGNOSIS — L03011 Cellulitis of right finger: Secondary | ICD-10-CM

## 2024-01-23 NOTE — ED Triage Notes (Signed)
 Pt states had a hang nail on right 3rd finger and she pulled it. Pt c/o pain and swelling in the finger. Pt has 1+ swelling in the finger. Pt states the pain radiates from the finger into her hand. Pt states she can't do anything with her finger, not even wipe herself.

## 2024-01-23 NOTE — Discharge Instructions (Addendum)
 Start antibiotics that were prescribed to you 2 days ago.  You may continue Epsom salt soaks as needed.  Follow-up with your PCP if your symptoms do not improve.  Please go to the ER for any worsening symptoms.  I hope you feel better soon!

## 2024-01-23 NOTE — ED Provider Notes (Signed)
 [facility]    CSN: [id number] Arrival date & time: 01/23/24  1647      History   Chief Complaint Chief Complaint  Patient presents with   paronychia    HPI Yvonne [name] is a 38 y.o. female presents for finger infection.  Patient reports she had a hangnail on her right third finger that she pulled and since then has been having pain and swelling to the distal finger.  She was seen in urgent care 2 days ago for same complaint and was started on Augmentin  but she has not yet picked this up.  She states it was not drained at that time but she feels like it needs to be drained now.  No fevers or chills.  No other concerns at this time.  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 - tx w/otc med   Asthma    albuterol  inhaler   Blood transfusion    2008 with c/s 4 units transfused   Carpal tunnel syndrome on right    bilateral   Chronic back pain    Depression    Dyspnea    exertion   Headache(784.0)    otc ibuprofen  800mg    Heart murmur    as a child   Obesity    Pneumonia    as a child   Thyroid  disease     Patient Active Problem List   Diagnosis Date Noted   S/P lumbar spinal fusion 02/13/2017   Herniated nucleus pulposus, lumbar 04/10/2016   HNP (herniated nucleus pulposus), lumbar 11/05/2014    Class: Chronic   S/P endometrial ablation - HTA 2012 06/03/2012   Encounter for sterilization 06/03/2012   OBESITY 09/03/2007   Migraine headache 09/03/2007    Past Surgical History:  Procedure Laterality Date   ABDOMINAL HYSTERECTOMY N/A 09/29/2012   Procedure: HYSTERECTOMY ABDOMINAL;  Surgeon: Jon CINDERELLA Rummer, MD;  Location: [facility];  Service: Gynecology;  Laterality: N/A;   APPENDECTOMY     BACK SURGERY     BILATERAL SALPINGECTOMY Bilateral 09/29/2012   Procedure: BILATERAL SALPINGECTOMY;  Surgeon: Jon CINDERELLA Rummer, MD;  Location: [facility];  Service: Gynecology;  Laterality:  Bilateral;   CESAREAN SECTION     CHOLECYSTECTOMY     CYSTOSCOPY Bilateral 09/29/2012   Procedure: CYSTOSCOPY;  Surgeon: Jon CINDERELLA Rummer, MD;  Location: [facility];  Service: Gynecology;  Laterality: Bilateral;   ENDOMETRIAL ABLATION     EXAMINATION UNDER ANESTHESIA N/A 10/16/2012   Procedure: EXAM UNDER ANESTHESIA;  Surgeon: Jon CINDERELLA Rummer, MD;  Location: [facility];  Service: Gynecology;  Laterality: N/A;  Suturing Vaginal Cuff   HAND SURGERY Right    carpal tunnel   LAPAROSCOPIC ASSISTED VAGINAL HYSTERECTOMY N/A 09/29/2012   Procedure: LAPAROSCOPIC ASSISTED VAGINAL HYSTERECTOMY;  Surgeon: Jon CINDERELLA Rummer, MD;  Location: [facility];  Service: Gynecology;  Laterality: N/A;   LAPAROSCOPIC HYSTERECTOMY Bilateral 09/29/2012   Procedure: HYSTERECTOMY TOTAL LAPAROSCOPIC;  Surgeon: Jon CINDERELLA Rummer, MD;  Location: [facility];  Service: Gynecology;  Laterality: Bilateral;  Total Laparoscopic Hysterectomy, Bilateral Salpingectomies, Cystoscopy poss. LAVH poss. TAH   LAPAROSCOPY     x3   LUMBAR LAMINECTOMY N/A 11/05/2014   Procedure: RIGHT L4-5 MICRODISCECTOMY ;  Surgeon: Lynwood FORBES Better, MD;  Location: [facility];  Service: Orthopedics;  Laterality: N/A;   LUMBAR LAMINECTOMY/DECOMPRESSION MICRODISCECTOMY N/A 04/10/2016   Procedure: Right L4-5  Microdiscectomy;  Surgeon: Lynwood FORBES Better, MD;  Location: [facility];  Service: Orthopedics;  Laterality: N/A;   THYROIDECTOMY     THYROIDECTOMY     TONSILLECTOMY     T & A   TRANSFORAMINAL LUMBAR INTERBODY FUSION (TLIF) WITH PEDICLE SCREW FIXATION 1 LEVEL N/A 02/13/2017   Procedure: Transforaminal Lumbar Interbody Fusion - Lumbar four-five;  Surgeon: Joshua Alm RAMAN, MD;  Location: [facility];  Service: Neurosurgery;  Laterality: N/A;   TUBAL LIGATION      OB History     Gravida  4   Para  4   Term  4   Preterm      AB      Living  1      SAB      IAB      Ectopic      Multiple      Live Births  1            Home Medications    Prior to Admission medications    Medication Sig Start Date End Date Taking? Authorizing Provider  albuterol  (PROVENTIL  HFA) 108 (90 Base) MCG/ACT inhaler Inhale 2 puffs into the lungs every 4 (four) hours as needed for wheezing or shortness of breath. 07/03/22   Viviann Pastor, MD  amoxicillin -clavulanate (AUGMENTIN ) 875-125 MG tablet Take 1 tablet by mouth every 12 (twelve) hours. 01/21/24   Piontek, Rocky, MD  atenolol (TENORMIN) 25 MG tablet Take 25 mg by mouth daily.    [provider]  busPIRone (BUSPAR) 5 MG tablet Take 5 mg by mouth 3 (three) times daily.    [provider]  hydrOXYzine (ATARAX) 50 MG tablet Take 50 mg by mouth 3 (three) times daily. 06/30/21   [provider]  levothyroxine (SYNTHROID) 75 MCG tablet Take 75 mcg by mouth daily before breakfast.    [provider]  lidocaine  (XYLOCAINE ) 2 % solution Use as directed 15 mLs in the mouth or throat every 3 (three) hours as needed for mouth pain. Swish/gargle and spit out 09/30/22   Rising, Rebecca, PA-C  linaclotide [facility]) 145 MCG CAPS capsule Take 145 mcg by mouth daily before breakfast. 08/28/23   [provider]  metFORMIN  (GLUCOPHAGE ) 500 MG tablet Take 1 tablet (500 mg total) by mouth 2 (two) times daily with a meal. 08/31/23   Upstill, Margit, PA-C  methocarbamol  (ROBAXIN ) 500 MG tablet Take 1 tablet (500 mg total) by mouth every 8 (eight) hours as needed for muscle spasms. 11/22/23   Patsey Lot, MD  ondansetron  (ZOFRAN ) 8 MG tablet Take 8 mg by mouth every 8 (eight) hours as needed. 06/08/21   [provider]  oxyCODONE -acetaminophen  (PERCOCET/ROXICET) 5-325 MG tablet Take 1 tablet by mouth every 6 (six) hours as needed for severe pain (pain score 7-10). 11/22/23   Patsey Lot, MD  potassium chloride  (KLOR-CON ) 10 MEQ tablet Take 1 tablet (10 mEq total) by mouth daily. 09/08/22   Christopher Savannah, PA-C  predniSONE  (DELTASONE ) 20 MG tablet Take 1 tablet (20 mg total) by mouth daily. 11/22/23   Patsey Lot, MD  QUEtiapine  (SEROQUEL  XR) 200 MG 24 hr tablet Take 200 mg by mouth at bedtime. 12/04/23   [provider]  QUEtiapine  (SEROQUEL ) 400 MG tablet Take 450 mg by mouth at bedtime.    [provider]  SUMAtriptan  (IMITREX ) 20 MG/ACT nasal spray Place 1 spray (20 mg total) into the nose as needed for migraine or headache. May repeat in 2 hours if headache  persists or recurs.  Use nasal spray on the same side as the headache. Patient not taking: Reported on 10/31/2023 07/03/22 07/03/23  Viviann Pastor, MD  Vitamin D , Ergocalciferol , (DRISDOL) 1.25 MG (50000 UNIT) CAPS capsule Take 50,000 Units by mouth once a week.    [provider]  WEGOVY 0.5 MG/0.5ML SOAJ SQ injection Inject 0.5 mg into the skin once a week.    [provider]    Family History Family History  Problem Relation Age of Onset   Diabetes Mother    Diabetes Maternal Grandmother    Stroke Father     Social History Social History   Tobacco Use   Smoking status: Former    Current packs/day: 0.00    Types: Cigarettes    Quit date: 10/17/2015    Years since quitting: 8.2   Smokeless tobacco: Never  Vaping Use   Vaping status: Never Used  Substance Use Topics   Alcohol use: Yes    Comment: occ   Drug use: Yes    Types: Marijuana     Allergies   Aspirin, Bee venom, Metoclopramide, Reglan [metoclopramide hcl], Acetaminophen , and Metronidazole    Review of Systems Review of Systems  Skin:        Finger infection     Physical Exam Triage Vital Signs ED Triage Vitals  Encounter Vitals Group     BP 01/23/24 1658 119/81     Girls Systolic BP Percentile --      Girls Diastolic BP Percentile --      Boys Systolic BP Percentile --      Boys Diastolic BP Percentile --      Pulse Rate 01/23/24 1658 79     Resp 01/23/24 1658 18     Temp 01/23/24 1658 98.2 F (36.8 C)     Temp Source 01/23/24 1658 Oral     SpO2 01/23/24 1658 96 %     Weight --      Height --      Head  Circumference --      Peak Flow --      Pain Score 01/23/24 1656 10     Pain Loc --      Pain Education --      Exclude from Growth Chart --    No data found.  Updated Vital Signs BP 119/81   Pulse 79   Temp 98.2 F (36.8 C) (Oral)   Resp 18   LMP 07/25/2012   SpO2 96%   Visual Acuity Right Eye Distance:   Left Eye Distance:   Bilateral Distance:    Right Eye Near:   Left Eye Near:    Bilateral Near:     Physical Exam Vitals and nursing note reviewed.  Constitutional:      General: She is not in acute distress.    Appearance: Normal appearance. She is not ill-appearing.  HENT:     Head: Normocephalic and atraumatic.  Eyes:     Pupils: Pupils are equal, round, and reactive to light.  Cardiovascular:     Rate and Rhythm: Normal rate.  Pulmonary:     Effort: Pulmonary effort is normal.  Skin:    General: Skin is warm and dry.     Comments: There is swelling with tenderness to the lateral aspect of the right distal finger.  Minimal fluctuance.  Neurological:     General: No focal deficit present.     Mental Status: She is alert and oriented to person, place, and  time.  Psychiatric:        Mood and Affect: Mood normal.        Behavior: Behavior normal.      UC Treatments / Results  Labs (all labs ordered are listed, but only abnormal results are displayed) Labs Reviewed - No data to display  EKG   Radiology No results found.  Procedures Incision and Drainage  Date/Time: 01/23/2024 5:26 PM  Performed by: Loreda Myla SAUNDERS, NP Authorized by: Loreda Myla SAUNDERS, NP   Consent:    Consent obtained:  Verbal   Consent given by:  Patient   Risks, benefits, and alternatives were discussed: yes     Risks discussed:  Pain, bleeding and incomplete drainage   Alternatives discussed:  Alternative treatment Universal protocol:    Patient identity confirmed:  Verbally with patient Location:    Indications for incision and drainage: Paronychia.   Location:  Upper  extremity   Upper extremity location:  Finger   Finger location:  R long finger Pre-procedure details:    Procedure prep: Alcohol swab. Sedation:    Sedation type:  None Anesthesia:    Anesthesia method: Pain ease topical spray. Procedure type:    Complexity:  Simple Procedure details:    Incision types:  Single straight (18-gauge needle)   Incision depth:  Subcutaneous   Drainage:  Purulent   Drainage amount:  Scant   Wound treatment:  Wound left open Post-procedure details:    Procedure completion:  Tolerated Comments:     Bacitracin  and Band-Aid applied  (including critical care time)  Medications Ordered in UC Medications - No data to display  Initial Impression / Assessment and Plan / UC Course  I have reviewed the triage vital signs and the nursing notes.  Pertinent labs & imaging results that were available during my care of the patient were reviewed by me and considered in my medical decision making (see chart for details).     Patient reports relief of pain after I&D.  Advised to keep clean and dry and to pick up the antibiotic she was prescribed 2 days ago.  PCP follow-up if symptoms do not improve.  ER precautions reviewed. Final Clinical Impressions(s) / UC Diagnoses   Final diagnoses:  Paronychia of finger, right     Discharge Instructions      Start antibiotics that were prescribed to you 2 days ago.  You may continue Epsom salt soaks as needed.  Follow-up with your PCP if your symptoms do not improve.  Please go to the ER for any worsening symptoms.  I hope you feel better soon!    ED Prescriptions   None    PDMP not reviewed this encounter.   Loreda Myla SAUNDERS, NP 01/23/24 [phone]

## 2024-04-21 ENCOUNTER — Encounter (HOSPITAL_BASED_OUTPATIENT_CLINIC_OR_DEPARTMENT_OTHER): Payer: Self-pay | Admitting: *Deleted

## 2024-04-21 ENCOUNTER — Other Ambulatory Visit: Payer: Self-pay

## 2024-04-21 ENCOUNTER — Emergency Department (HOSPITAL_BASED_OUTPATIENT_CLINIC_OR_DEPARTMENT_OTHER)
Admission: EM | Admit: 2024-04-21 | Discharge: 2024-04-21 | Discharge status: Against Medical Advice | Attending: Emergency Medicine | Admitting: Emergency Medicine

## 2024-04-21 DIAGNOSIS — M545 Low back pain, unspecified: Secondary | ICD-10-CM | POA: Insufficient documentation

## 2024-04-21 DIAGNOSIS — Z5321 Procedure and treatment not carried out due to patient leaving prior to being seen by health care provider: Secondary | ICD-10-CM | POA: Diagnosis not present

## 2024-04-21 NOTE — ED Triage Notes (Addendum)
 Here by POV from home for back pain, gradually progressively worse. Onset 4d ago. Feels like my rods have shifted/ moved, Pinpoints to R lower back and into R leg. No relief with ibuprofen . Has PCP with [facility]. No ortho/ neuro MD. Rates 9/10. 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 H/o anemia with transfusion. Ambulatory, steady gait. Alert, NAD, calm. Denies fall or injury. Denies sx other than pain.

## 2024-06-04 ENCOUNTER — Ambulatory Visit: Admitting: Sports Medicine
